# Patient Record
Sex: Female | Born: 1965
Health system: Southern US, Community
[De-identification: ages and names within clinical notes are randomized; demographics above are authoritative.]

## PROBLEM LIST (undated history)

## (undated) DIAGNOSIS — M1990 Unspecified osteoarthritis, unspecified site: Secondary | ICD-10-CM

## (undated) DIAGNOSIS — K219 Gastro-esophageal reflux disease without esophagitis: Secondary | ICD-10-CM

## (undated) DIAGNOSIS — T7840XA Allergy, unspecified, initial encounter: Secondary | ICD-10-CM

## (undated) DIAGNOSIS — M797 Fibromyalgia: Secondary | ICD-10-CM

## (undated) DIAGNOSIS — I10 Essential (primary) hypertension: Secondary | ICD-10-CM

## (undated) DIAGNOSIS — N200 Calculus of kidney: Secondary | ICD-10-CM

## (undated) DIAGNOSIS — D649 Anemia, unspecified: Secondary | ICD-10-CM

## (undated) DIAGNOSIS — F419 Anxiety disorder, unspecified: Secondary | ICD-10-CM

## (undated) DIAGNOSIS — K589 Irritable bowel syndrome without diarrhea: Secondary | ICD-10-CM

## (undated) DIAGNOSIS — M722 Plantar fascial fibromatosis: Secondary | ICD-10-CM

## (undated) HISTORY — DX: Calculus of kidney: N20.0

## (undated) HISTORY — DX: Anxiety disorder, unspecified: F41.9

## (undated) HISTORY — PX: OTHER SURGICAL HISTORY: SHX169

## (undated) HISTORY — DX: Anemia, unspecified: D64.9

## (undated) HISTORY — DX: Gastro-esophageal reflux disease without esophagitis: K21.9

## (undated) HISTORY — DX: Unspecified osteoarthritis, unspecified site: M19.90

## (undated) HISTORY — DX: Fibromyalgia: M79.7

## (undated) HISTORY — DX: Allergy, unspecified, initial encounter: T78.40XA

## (undated) HISTORY — DX: Irritable bowel syndrome, unspecified: K58.9

## (undated) HISTORY — PX: COLONOSCOPY: SHX174

## (undated) HISTORY — DX: Essential (primary) hypertension: I10

---

## 1998-12-28 ENCOUNTER — Other Ambulatory Visit: Admission: RE | Admit: 1998-12-28 | Discharge: 1998-12-28 | Payer: Self-pay | Admitting: Obstetrics and Gynecology

## 1999-03-14 ENCOUNTER — Encounter: Admission: RE | Admit: 1999-03-14 | Discharge: 1999-03-14 | Payer: Self-pay | Admitting: *Deleted

## 1999-03-14 ENCOUNTER — Encounter: Payer: Self-pay | Admitting: Rheumatology

## 1999-04-24 ENCOUNTER — Ambulatory Visit (HOSPITAL_BASED_OUTPATIENT_CLINIC_OR_DEPARTMENT_OTHER): Admission: RE | Admit: 1999-04-24 | Discharge: 1999-04-24 | Payer: Self-pay | Admitting: Orthopedic Surgery

## 1999-10-05 ENCOUNTER — Other Ambulatory Visit: Admission: RE | Admit: 1999-10-05 | Discharge: 1999-10-05 | Payer: Self-pay | Admitting: *Deleted

## 2000-06-26 ENCOUNTER — Encounter: Payer: Self-pay | Admitting: Gastroenterology

## 2001-01-01 ENCOUNTER — Other Ambulatory Visit: Admission: RE | Admit: 2001-01-01 | Discharge: 2001-01-01 | Payer: Self-pay | Admitting: Obstetrics and Gynecology

## 2001-05-23 ENCOUNTER — Encounter: Admission: RE | Admit: 2001-05-23 | Discharge: 2001-05-23 | Payer: Self-pay | Admitting: Obstetrics and Gynecology

## 2001-05-23 ENCOUNTER — Encounter: Payer: Self-pay | Admitting: Obstetrics and Gynecology

## 2001-06-27 ENCOUNTER — Encounter: Admission: RE | Admit: 2001-06-27 | Discharge: 2001-06-27 | Payer: Self-pay | Admitting: Internal Medicine

## 2001-06-27 ENCOUNTER — Encounter: Payer: Self-pay | Admitting: Internal Medicine

## 2002-01-30 ENCOUNTER — Other Ambulatory Visit: Admission: RE | Admit: 2002-01-30 | Discharge: 2002-01-30 | Payer: Self-pay | Admitting: Obstetrics and Gynecology

## 2002-07-22 ENCOUNTER — Encounter: Payer: Self-pay | Admitting: Obstetrics and Gynecology

## 2002-07-22 ENCOUNTER — Encounter: Admission: RE | Admit: 2002-07-22 | Discharge: 2002-07-22 | Payer: Self-pay | Admitting: Obstetrics and Gynecology

## 2003-01-16 HISTORY — PX: OTHER SURGICAL HISTORY: SHX169

## 2003-02-02 ENCOUNTER — Other Ambulatory Visit: Admission: RE | Admit: 2003-02-02 | Discharge: 2003-02-02 | Payer: Self-pay | Admitting: Obstetrics and Gynecology

## 2003-09-10 ENCOUNTER — Encounter: Admission: RE | Admit: 2003-09-10 | Discharge: 2003-09-10 | Payer: Self-pay | Admitting: Obstetrics and Gynecology

## 2004-02-18 ENCOUNTER — Emergency Department (HOSPITAL_COMMUNITY): Admission: EM | Admit: 2004-02-18 | Discharge: 2004-02-18 | Payer: Self-pay | Admitting: Emergency Medicine

## 2004-02-22 ENCOUNTER — Other Ambulatory Visit: Admission: RE | Admit: 2004-02-22 | Discharge: 2004-02-22 | Payer: Self-pay | Admitting: Obstetrics and Gynecology

## 2004-03-15 ENCOUNTER — Ambulatory Visit (HOSPITAL_COMMUNITY): Admission: RE | Admit: 2004-03-15 | Discharge: 2004-03-15 | Payer: Self-pay | Admitting: *Deleted

## 2004-05-23 ENCOUNTER — Ambulatory Visit: Payer: Self-pay | Admitting: Gastroenterology

## 2004-06-09 ENCOUNTER — Ambulatory Visit: Payer: Self-pay | Admitting: Gastroenterology

## 2004-09-11 ENCOUNTER — Encounter: Admission: RE | Admit: 2004-09-11 | Discharge: 2004-09-11 | Payer: Self-pay | Admitting: Obstetrics and Gynecology

## 2004-11-30 ENCOUNTER — Encounter: Admission: RE | Admit: 2004-11-30 | Discharge: 2005-02-28 | Payer: Self-pay | Admitting: Internal Medicine

## 2005-01-23 ENCOUNTER — Ambulatory Visit: Payer: Self-pay

## 2005-03-01 ENCOUNTER — Encounter: Admission: RE | Admit: 2005-03-01 | Discharge: 2005-05-30 | Payer: Self-pay | Admitting: Internal Medicine

## 2005-03-08 ENCOUNTER — Other Ambulatory Visit: Admission: RE | Admit: 2005-03-08 | Discharge: 2005-03-08 | Payer: Self-pay | Admitting: Obstetrics & Gynecology

## 2005-10-18 ENCOUNTER — Encounter: Admission: RE | Admit: 2005-10-18 | Discharge: 2005-10-18 | Payer: Self-pay | Admitting: Obstetrics and Gynecology

## 2006-03-11 ENCOUNTER — Ambulatory Visit (HOSPITAL_COMMUNITY): Admission: RE | Admit: 2006-03-11 | Discharge: 2006-03-11 | Payer: Self-pay | Admitting: Internal Medicine

## 2006-03-28 ENCOUNTER — Other Ambulatory Visit: Admission: RE | Admit: 2006-03-28 | Discharge: 2006-03-28 | Payer: Self-pay | Admitting: Obstetrics & Gynecology

## 2006-04-19 ENCOUNTER — Encounter: Admission: RE | Admit: 2006-04-19 | Discharge: 2006-04-19 | Payer: Self-pay | Admitting: Obstetrics and Gynecology

## 2007-03-12 ENCOUNTER — Encounter: Admission: RE | Admit: 2007-03-12 | Discharge: 2007-03-12 | Payer: Self-pay | Admitting: Obstetrics and Gynecology

## 2007-05-03 ENCOUNTER — Other Ambulatory Visit: Admission: RE | Admit: 2007-05-03 | Discharge: 2007-05-03 | Payer: Self-pay | Admitting: Obstetrics and Gynecology

## 2008-04-07 ENCOUNTER — Emergency Department (HOSPITAL_COMMUNITY): Admission: EM | Admit: 2008-04-07 | Discharge: 2008-04-07 | Payer: Self-pay | Admitting: Emergency Medicine

## 2008-05-12 DIAGNOSIS — F411 Generalized anxiety disorder: Secondary | ICD-10-CM | POA: Insufficient documentation

## 2008-05-12 DIAGNOSIS — I10 Essential (primary) hypertension: Secondary | ICD-10-CM | POA: Insufficient documentation

## 2009-01-25 ENCOUNTER — Emergency Department (HOSPITAL_COMMUNITY): Admission: EM | Admit: 2009-01-25 | Discharge: 2009-01-25 | Payer: Self-pay | Admitting: Emergency Medicine

## 2009-08-01 ENCOUNTER — Encounter (INDEPENDENT_AMBULATORY_CARE_PROVIDER_SITE_OTHER): Payer: Self-pay | Admitting: *Deleted

## 2009-09-21 ENCOUNTER — Ambulatory Visit: Payer: Self-pay | Admitting: Gastroenterology

## 2009-09-27 ENCOUNTER — Other Ambulatory Visit: Admission: RE | Admit: 2009-09-27 | Discharge: 2009-09-27 | Payer: Self-pay | Admitting: Internal Medicine

## 2009-09-27 ENCOUNTER — Ambulatory Visit (HOSPITAL_COMMUNITY): Admission: RE | Admit: 2009-09-27 | Discharge: 2009-09-27 | Payer: Self-pay | Admitting: Internal Medicine

## 2009-09-29 ENCOUNTER — Encounter: Admission: RE | Admit: 2009-09-29 | Discharge: 2009-09-29 | Payer: Self-pay | Admitting: Gastroenterology

## 2010-02-05 ENCOUNTER — Encounter: Payer: Self-pay | Admitting: Gastroenterology

## 2010-02-14 NOTE — Procedures (Signed)
Summary: Flexible Sigmoidoscopy   Flexible Sigmoidoscopy  Procedure date:  06/26/2000  Findings:      Results: Normal. Quality of Study: Good.  Patient Name: Wendy Valencia, Wendy Valencia MRN:  Procedure Procedures: Flexible Proctosigmoidoscopy CPT: [phone].  Personnel: Endoscopist: Venita Lick. Russella Dar, MD, Clementeen Graham.  Referred By: Judie Petit. Nicanor Bake, MD.  Exam Location: Exam performed in Outpatient Clinic. Outpatient  Patient Consent: Procedure, Alternatives, Risks and Benefits discussed, consent obtained, from patient.  Indications Symptoms: Constipation. Abdominal pain / bloating.  History  Pre-Exam Physical: Performed Jun 26, 2000. Cardio-pulmonary exam  WNL. Rectal exam, HEENT exam , Abdominal exam, Extremity exam, Neurological exam, Mental status exam WNL.  Exam Exam: Extent visualized: Descending Colon. Extent of exam: 50 cm. Patient position: on left side. Colon retroflexion performed. ASA Classification: I. Tolerance: excellent.  Monitoring: Pulse and BP monitoring, Oximetry used. Supplemental O2 given.  Colon Prep Used Fleets enema for colon prep. Prep: good.  Sedation Meds: Residual sedation present from prior procedure today. Versed 2 mg.  Findings , IMAGE TAKEN - NORMAL EXAM: Descending Colon to Rectum.   Assessment Normal examination.  Events  Unplanned Intervention: No intervention was required.  Unplanned Events: There were no complications. Plans Medication Plan: Continue current medications.  Patient Education: Patient given standard instructions for: Constipation. Disposition: After procedure patient sent to recovery. After recovery patient sent home.  Scheduling: Referring Spyros Winch, to M. Nicanor Bake, MD, on Jun 27, 2000.    This report was created from the original endoscopy report, which was reviewed and signed by the above listed endoscopist.    cc: M. Nicanor Bake, MD

## 2010-02-14 NOTE — Procedures (Signed)
Summary: EGD   EGD  Procedure date:  06/26/2000  Findings:      Findings: Normal  Location: [facility]    EGD  Procedure date:  06/26/2000  Findings:      Findings: Normal  Location: [facility]   Patient Name: Wendy Valencia, Wendy Valencia MRN:  Procedure Procedures: Panendoscopy (EGD) CPT: 43235.  Personnel: Endoscopist: Venita Lick. Russella Dar, MD, Clementeen Graham.  Referred By: Judie Petit. Nicanor Bake, MD.  Exam Location: Exam performed in Outpatient Clinic. Outpatient  Patient Consent: Procedure, Alternatives, Risks and Benefits discussed, consent obtained, from patient.  Indications Symptoms: Abdominal pain, location: epigastric. Reflux symptoms  History  Pre-Exam Physical: Performed Jun 26, 2000  Cardio-pulmonary exam, HEENT exam, Abdominal exam, Extremity exam, Neurological exam, Mental status exam WNL.  Exam Exam Info: Maximum depth of insertion Duodenum, intended Duodenum. Patient position: on left side. Vocal cords not visualized. Gastric retroflexion performed. Images taken. ASA Classification: I. Tolerance: good.  Sedation Meds: Patient assessed and found to be appropriate for moderate (conscious) sedation. Fentanyl 100 mcg. Versed 10 mg. Cetacaine Spray 2 sprays  Monitoring: BP and pulse monitoring done. Oximetry used. Supplemental O2 given  Findings Normal: Proximal Esophagus to Duodenal 2nd Portion.   Assessment Normal examination.  Events  Unplanned Intervention: No unplanned interventions were required.  Unplanned Events: There were no complications. Plans Medication(s): Continue current medications. PPI: Pantoprazole/Protonix 40 mg QD,   Patient Education: Patient given standard instructions for: Reflux.  Disposition: After procedure patient sent to recovery. After recovery patient sent home.  Scheduling: Referring provider, to M. Nicanor Bake, MD, Jun 27, 2000.    This report was created from the original endoscopy report, which  was reviewed and signed by the above listed endoscopist.    cc: M. Nicanor Bake, MD

## 2010-02-14 NOTE — Assessment & Plan Note (Signed)
Summary: Gastroenterology  Talitha A MR#:  [id number] Page #  NAME:  Wendy Valencia, Wendy Valencia  OFFICE NO:  [id number]  DATE:  05/23/04  DOB:  [birth year]  REFERRING PHYSICIAN:  Dr. Marisue Brooklyn  REASON FOR REFERRAL:  Abdominal pain, bloating, and constipation.  HISTORY OF PRESENT ILLNESS:  The patient is a 45 year old [place] American female that I saw in the past for similar symptoms in April of 2002.  She did have an iron-deficiency anemia at that time, which was felt to be related to menorrhagia.  She underwent flexible sigmoidoscopy and upper endoscopy on June 26, 2000.  Both exams were completely normal.  She was treated for constipation and presumed gastroesophageal reflux disease.  Abdominal ultrasound imaging did reveal cholelithiasis and fatty infiltration of the liver in May of 2002.  She states her iron-deficiency anemia has resolved since beginning Depo-Medrol injections.  She has ongoing problems with generalized abdominal pain associated with bloating, occasional cramps, and hyperactive bowel sounds.  She has intermittent nausea.  She does have intermittent belching and nausea that have increased over the past 3 months.  She uses Tums frequently, which does seem to help her symptoms.  She was started on Zelnorm, which did appear to improve her symptoms.  However, she developed worse cramping on this medication while taking it at lunch and dinner, and she has cut it back to once a day.  She was on Wellbutrin for 2 years and then Lexapro for a while, and her symptoms did appear to be under better control while on these medications.  She had a tummy tuck done about a year and a half ago.  She has noted dark stools while taking iron, but since discontinuing iron, her stools are normal color.  She notes no rectal bleeding, change in stool caliber, dysphagia, or odynophagia.  There is no family history of colon cancer, colon polyps, or inflammatory bowel disease.  PAST MEDICAL HISTORY:  Hypertension,  anxiety, panic disorder, depression, history of kidney stones, status post tummy tuck, constipation.  CURRENT MEDICATIONS:  Current medications listed on the chart are updated and reviewed.  MEDICATION ALLERGIES:  None known.  SOCIAL HISTORY:  She is married with 1 child.  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  She smokes about 1 pack of cigarettes a week and drinks about 1 alcoholic beverage a week.  REVIEW OF SYSTEMS:  Multiple areas are positive per the handwritten form.   PHYSICAL EXAMINATION:  An anxious, obese African American female in no acute distress.  Height 5 feet 7-1/2 inches, weight 244 pounds, blood pressure 138/74, pulse 98, respirations 20.  HEENT exam:  Anicteric sclerae.  Oropharynx clear.  Chest:  Clear to auscultation bilaterally.  Cardiac:  Regular rate and rhythm without murmurs appreciated.  Abdomen is soft, with generalized tenderness to deep palpation.  No rebound or guarding.  No palpable organomegaly, masses, or hernias.  Normoactive bowel sounds.  No distention appreciated.  Rectal examination deferred.  Extremities are without clubbing, cyanosis, or edema.  Neurologic:  Alert and oriented x3, anxious.  Grossly nonfocal.  ASSESSMENT AND PLAN:  Presumed constipation-predominant irritable bowel syndrome by prior evaluation.  She is given all standard instructions for adequate fluid and fiber intake, and she is advised to take her Zelnorm in the morning and in the evening so the doses are approximately 10-12 hours apart.  She is advised to take a mild stool softener or a mild laxative such as milk of magnesia for refractory constipation.  She is given information on a low-gas diet and encouraged to continue using Beano.  She does have a history of cholelithiasis, and her intermittent right-sided abdominal pains may be related to symptomatic cholelithiasis.  We will proceed with abdominal ultrasound imaging.  Return office visit after the ultrasound  has been completed, and she has adjusted her diet and Zelnorm dosage as above.      Venita Lick. Russella Dar, M.D., F.A.C.G.  [id number] cc:  Dr. Marisue Brooklyn D:  05/23/04; T:  ; Job [phone]

## 2010-02-14 NOTE — Letter (Signed)
Summary: New Patient letter  [facility]  [address] [place], [place] [address]   Phone: [phone]  Fax: [phone]       08/01/2009 MRN: [id number]  Wendy Valencia [address] [place], [place]  [address]  Dear [name],  Welcome to the Gastroenterology Division at [facility].    You are scheduled to see Dr. Claudette Head on September 21, 2009 at 10:15am on the 3rd floor at [facility], [address]  We ask that you try to arrive at our office 15 minutes prior to your appointment time to allow for check-in.  We would like you to complete the enclosed self-administered evaluation form prior to your visit and bring it with you on the day of your appointment.  We will review it with you.  Also, please bring a complete list of all your medications or, if you prefer, bring the medication bottles and we will list them.  Please bring your insurance card so that we may make a copy of it.  If your insurance requires a referral to see a specialist, please bring your referral form from your primary care physician.  Co-payments are due at the time of your visit and may be paid by cash, check or credit card.     Your office visit will consist of a consult with your physician (includes a physical exam), any laboratory testing he/she may order, scheduling of any necessary diagnostic testing (e.g. x-ray, ultrasound, CT-scan), and scheduling of a procedure (e.g. Endoscopy, Colonoscopy) if required.  Please allow enough time on your schedule to allow for any/all of these possibilities.    If you cannot keep your appointment, please call [phone] to cancel or reschedule prior to your appointment date.  This allows [place] the opportunity to schedule an appointment for another patient in need of care.  If you do not cancel or reschedule by 5 p.m. the business day prior to your appointment date, you will be charged a $50.00 late cancellation/no-show fee.     Thank you for choosing [facility] for your medical needs.  We appreciate the opportunity to care for you.  Please visit [place] at our website  to learn more about our practice.                     Sincerely,                                                             The Gastroenterology Division

## 2010-02-14 NOTE — Assessment & Plan Note (Signed)
Summary: abd pain....em   History of Present Illness Visit Type: Initial Visit Primary GI MD: Elie Goody MD [facility] Primary Provider: Marisue Brooklyn, DO Chief Complaint: LUQ pain x2 weeks History of Present Illness:   This is a 45 year old female who relates years of recurrent abdominal pain, belching, bloating, and constipation. She has had more significant symptoms with recurrent left upper quadrant pain and notes a 20 pound weight loss over the past 7 months with intention to lose weight. She states that metoclopramide is effective in controlling her abdominal pain and bloating. Zelnorm, used in the past, substantially helped all her symptoms. Amitza has lead to diarrhea. Levsin has been ineffective.  She has tried Nexium, Protonix and Prevacid without a significant change in symptoms. She takes a tea containing senna approximately once per week for management of constipation. She underwent EGD and flexible sigmoidoscopy in June 2002, and both were normal.   GI Review of Systems    Reports abdominal pain, belching, bloating, chest pain, loss of appetite, nausea, vomiting, and  weight loss.     Location of  Abdominal pain: LUQ. Weight loss of 20 pounds over 7 months.   Denies acid reflux, dysphagia with liquids, dysphagia with solids, heartburn, vomiting blood, and  weight gain.      Reports change in bowel habits, constipation, diarrhea, and  irritable bowel syndrome.     Denies anal fissure, black tarry stools, diverticulosis, fecal incontinence, heme positive stool, hemorrhoids, jaundice, light color stool, liver problems, rectal bleeding, and  rectal pain. Preventive Screening-Counseling & Management      Drug Use:  no.     Current Medications (verified): 1)  Metoclopramide Hcl 10 Mg Tabs (Metoclopramide Hcl) .... Three Times A Day As Needed 2)  Xanax 1 Mg Tabs (Alprazolam) .... Take 1 1/2 Tablet At Bedtime 3)  Tramadol Hcl 50 Mg Tabs (Tramadol Hcl) .... Take 1 Tablet  Every  Morning 4)  Lisinopril-Hydrochlorothiazide 20-12.5 Mg Tabs (Lisinopril-Hydrochlorothiazide) .... Once Daily 5)  Phentermine Hcl 37.5 Mg Caps (Phentermine Hcl) .... Once Daily  Allergies (verified): No Known Drug Allergies  Past History:  Past Medical History: Iron-deficiency anemia Fatty infiltration of the liver Gallstones Anxiety Disorder Hypertension Panic disorder Depression Arthritis Irritable Bowel Syndrome Obesity  Past Surgical History: Reviewed history from 09/16/2009 and no changes required. Tummy tuck surgery Left shoulder surgery  Family History: Family History of Breast Cancer:Mother No FH of Colon Cancer: Family History of Irritable Bowel Syndrome:Mother  Social History: Married  current smoker w/i last 12 mos., occasional drinker  Occupation: Airline pilot Alcohol Use - yes Daily Caffeine Use Illicit Drug Use - no Drug Use:  no  Review of Systems       The patient complains of allergy/sinus, arthritis/joint pain, back pain, headaches-new, muscle pains/cramps, skin rash, and sleeping problems.         The pertinent positives and negatives are noted as above and in the HPI. All other ROS were reviewed and were negative.   Vital Signs:  Patient profile:   45 year old female Height:      67 inches Weight:      233.38 pounds BMI:     36.68 Pulse rate:   68 / minute Pulse rhythm:   regular BP sitting:   116 / 70  (left arm) Cuff size:   large  Vitals Entered By: June McMurray CMA Duncan Dull) (September 21, 2009 10:18 AM)  Physical Exam  General:  Well developed, well nourished, no acute distress. obese.  Head:  Normocephalic and atraumatic. Eyes:  PERRLA, no icterus. Ears:  Normal auditory acuity. Mouth:  No deformity or lesions, dentition normal. Neck:  Supple; no masses or thyromegaly. Lungs:  Clear throughout to auscultation. Heart:  Regular rate and rhythm; no murmurs, rubs,  or bruits. Abdomen:  Soft, nontender and nondistended. No masses,  hepatosplenomegaly or hernias noted. Normal bowel sounds. Msk:  Symmetrical with no gross deformities. Normal posture. Pulses:  Normal pulses noted. Extremities:  No clubbing, cyanosis, edema or deformities noted. Neurologic:  Alert and  oriented x4;  grossly normal neurologically. Cervical Nodes:  No significant cervical adenopathy. Inguinal Nodes:  No significant inguinal adenopathy. Psych:  Alert and cooperative. anxious and easily distracted.    Impression & Recommendations:  Problem # 1:  ABDOMINAL PAIN-LUQ (ICD-789.02) Recurrent left upper quadrant abdominal pain, associated with gas, abdominal bloating, and variable bowel habits. I suspect she has irritable bowel syndrome. She states she has a physical examination scheduled with Dr. Elisabeth Most next week and I await the blood work from that visit. Schedule abd ultrasound. Begin a low gas diet. Trial of Levbid one twice daily. The long-term potential side effects of metoclopramide, including tardive dyskinesia, were discussed with the patient and I asked her to discontinue or at least minimize this medication.  Problem # 2:  CONSTIPATION (ICD-564.00) Increase fiber and fluid intake. Trial of Florastor one twice daily and MiraLax daily as needed. Avoid senna containing laxatives.  Problem # 3:  ANXIETY (ICD-300.00) An SSRI or similar medication may help for management of anxiety and also can help with long-term management of irritable bowel syndrome. Will ask Dr. Carmela Hurt to further consider.  Other Orders: Ultrasound Abdomen (UAS)  Patient Instructions: 1)  Start Florastor samples one capsule by mouth two times a day x 1 month.  2)  Pick up your prescription for Levbid at your pharmacy.  3)  Excessive Gas Diet handout given.  4)  Decrease or Discontinue Metaclopramide and adverse reaction sheet given to patient.  5)  Please schedule a follow-up appointment in 2 months.  6)  Copy sent to : Marisue Brooklyn, DO 7)  The medication list  was reviewed and reconciled.  All changed / newly prescribed medications were explained.  A complete medication list was provided to the patient / caregiver.  Prescriptions: LEVBID 0.375 MG XR12H-TAB (HYOSCYAMINE SULFATE) one tablet by mouth two times a day  #60 x 11   Entered by:   Christie Nottingham CMA (AAMA)   Authorized by:   Meryl Dare MD [facility]   Signed by:   Meryl Dare MD FACG on 09/21/2009   Method used:   Electronically to        [id number] (retail)       [address] [place], [place]  [address]       Ph: [phone]       Fax: [phone]   RxID:   [id number]

## 2010-04-27 LAB — URINALYSIS, ROUTINE W REFLEX MICROSCOPIC
Bilirubin Urine: NEGATIVE
Ketones, ur: NEGATIVE mg/dL
Nitrite: NEGATIVE
Urobilinogen, UA: 1 mg/dL (ref 0.0–1.0)
pH: 6.5 (ref 5.0–8.0)

## 2010-04-27 LAB — DIFFERENTIAL: Basophils Relative: 0 % (ref 0–1)

## 2010-04-27 LAB — CBC
MCHC: 34.4 g/dL (ref 30.0–36.0)
MCV: 88.1 fL (ref 78.0–100.0)
RBC: 4.46 MIL/uL (ref 3.87–5.11)

## 2010-04-27 LAB — BASIC METABOLIC PANEL
CO2: 23 mEq/L (ref 19–32)
Chloride: 110 mEq/L (ref 96–112)
Creatinine, Ser: 0.78 mg/dL (ref 0.4–1.2)
GFR calc non Af Amer: >60 mL/min (ref >60)
Glucose, Bld: 108 mg/dL — ABNORMAL HIGH (ref 70–99)
Potassium: 3.7 mEq/L (ref 3.5–5.1)

## 2010-04-27 LAB — D-DIMER, QUANTITATIVE: D-Dimer, Quant: 0.34 ug/mL-FEU (ref 0.00–0.48)

## 2010-04-27 LAB — POCT CARDIAC MARKERS
CKMB, poc: <1 ng/mL — ABNORMAL LOW (ref 1.0–8.0)
Myoglobin, poc: 53.7 ng/mL (ref 12–200)
Troponin i, poc: <0.05 ng/mL (ref 0.00–0.09)

## 2010-04-27 LAB — POCT PREGNANCY, URINE: Preg Test, Ur: NEGATIVE

## 2010-06-02 NOTE — Op Note (Signed)
[place]  Patient:    Wendy Valencia, Wendy Valencia                          MRN: [id number] Proc. Date: 04/24/99 Attending:  Georgena Spurling, M.D.                           Operative Report  PREOPERATIVE DIAGNOSIS:  Left shoulder impingement syndrome and bursitis and tendinitis.  POSTOPERATIVE DIAGNOSIS:  Left shoulder impingement syndrome and bursitis and tendinitis.  PROCEDURE:  Right shoulder arthroscopy with a subacromial decompression and rotator cuff debridement.  SURGEON:  Georgena Spurling, M.D.  ASSISTANT:  Jamelle Rushing, P.A.-C.  ANESTHESIA:  General.  INDICATIONS:  The patient has undergone conservative treatment, with failure of  physical therapy, anti-inflammatories, and injections.  DESCRIPTION OF PROCEDURE:  The patient was taken to the outpatient operating room and placed in the beach chair position, after undergoing general anesthesia. The left shoulder and upper extremity were prepped and draped in the usual sterile fashion.  A #11 blade was used to create a posterolateral portal.  A blunt trocar and cannula were used to enter the glenohumeral joint, and the camera was inserted, and a glenohumeral diagnostic arthroscopy was performed.  There was some fraying of the labrum anteriorly.  It was felt to be significant, so the anterior portals ere made with a #18 gauge spinal needle under direct visualization, followed by a #11 blade, a blunt trocar, and cannula.  The 4.5 Kudo shaver was used to debride the labrum, but there was a good amount of labrum left, and it was firmly attached o the anterior glenoid.  The anterior portal was then closed with a #4-0 nylon stitch.  At this point we created a lateral portal large enough for a 6.0 mm cannula to be placed in the subacromial space, and the camera was inserted into the subacromial space posteriorly.  The Kudo shaver was then placed in the subacromial space and a complete bursectomy  was performed.  The Arthro-Care debridement Wand was then used to remove the bursa from the undersurface of the acromion, to expose the anterolateral corner and the anterior rim of the acromion, as well as the [facility] joint.  The 5.5 mm bur was used to resect approximately 3.0 to 4.0 mm of bone from the anterior acromion.  This was checked with the camera, both in the posterior and in the lateral portal positions.  The CL ligament was excised with the Arthro-Care Wand.  The debridement of the rotator cuff was performed, and there was no identifiable full-thickness tear.  Once this was insured, and all bursa was removed, switching from the posterior to the lateral portals with the shaver and the camera, the fluid and instruments were removed.  A Marcaine/morphine mixture was placed in the three portals.  They were closed with interrupted #4-0 nylon stitches and covered with Xeroform dressing, sponges, three ABDs, and tape. The patient was placed in a sling.  The patient tolerated the procedure well and was awakened in stable condition, nd taken to the recovery room.  DRAINS:  None.  COMPLICATIONS:  None.  TOURNIQUET:  None. DD:  04/25/99 TD:  04/26/99 Job: [id number]

## 2010-10-05 ENCOUNTER — Other Ambulatory Visit (HOSPITAL_COMMUNITY)
Admission: RE | Admit: 2010-10-05 | Discharge: 2010-10-05 | Disposition: A | Discharge status: Home / Self Care | Payer: BC Managed Care – PPO | Source: Ambulatory Visit | Attending: Internal Medicine | Admitting: Internal Medicine

## 2010-10-05 DIAGNOSIS — Z01419 Encounter for gynecological examination (general) (routine) without abnormal findings: Secondary | ICD-10-CM | POA: Insufficient documentation

## 2010-10-11 ENCOUNTER — Encounter: Payer: Self-pay | Admitting: Cardiology

## 2010-10-11 ENCOUNTER — Other Ambulatory Visit: Payer: Self-pay | Admitting: Internal Medicine

## 2010-10-11 ENCOUNTER — Ambulatory Visit (INDEPENDENT_AMBULATORY_CARE_PROVIDER_SITE_OTHER): Payer: BC Managed Care – PPO | Admitting: Cardiology

## 2010-10-11 DIAGNOSIS — Z720 Tobacco use: Secondary | ICD-10-CM | POA: Insufficient documentation

## 2010-10-11 DIAGNOSIS — F17200 Nicotine dependence, unspecified, uncomplicated: Secondary | ICD-10-CM

## 2010-10-11 DIAGNOSIS — R0600 Dyspnea, unspecified: Secondary | ICD-10-CM

## 2010-10-11 DIAGNOSIS — R9431 Abnormal electrocardiogram [ECG] [EKG]: Secondary | ICD-10-CM

## 2010-10-11 DIAGNOSIS — Z1231 Encounter for screening mammogram for malignant neoplasm of breast: Secondary | ICD-10-CM

## 2010-10-11 DIAGNOSIS — I10 Essential (primary) hypertension: Secondary | ICD-10-CM

## 2010-10-11 DIAGNOSIS — R0602 Shortness of breath: Secondary | ICD-10-CM

## 2010-10-11 DIAGNOSIS — R0609 Other forms of dyspnea: Secondary | ICD-10-CM

## 2010-10-11 NOTE — Assessment & Plan Note (Signed)
Blood pressure controlled. Management per primary care.

## 2010-10-11 NOTE — Patient Instructions (Signed)
Your physician has requested that you have a stress echocardiogram. For further information please visit [email or web address]. Please follow instruction sheet as given.   

## 2010-10-11 NOTE — Progress Notes (Signed)
HPI: 45 yo female for evaluation of abnormal ECG and dyspnea. Echocardiogram in January of 2007 showed normal LV function and mild left atrial enlargement. Patient notices increased dyspnea on exertion over the past 6 months. It occurs with more vigorous activities but not routine activities. No orthopnea, PND, pedal edema, syncope or chest pain. Recent electrocardiogram felt to be abnormal. Because of the above cardiology is asked to further evaluate.  Current Outpatient Prescriptions  Medication Sig Dispense Refill  . ALPRAZolam (XANAX) 1 MG tablet Take 1 mg by mouth at bedtime as needed.        . Aspirin-Caffeine (BC FAST PAIN RELIEF ARTHRITIS) 1000-65 MG PACK Take by mouth 3 (three) times a week.        . Calcium-Phosphorus-Vitamin D 200-96.6-200 MG-MG-UNIT CHEW Chew by mouth.        . cetirizine-pseudoephedrine (ZYRTEC-D) 5-120 MG per tablet Take 1 tablet by mouth as needed.        . Cholecalciferol (EQL VITAMIN D GUMMIES CHILD PO) Take 2,000 Units by mouth daily.        . diphenhydrAMINE (BENADRYL) 25 mg capsule Take 25 mg by mouth every 6 (six) hours as needed.        Marland Kitchen ibuprofen (ADVIL,MOTRIN) 200 MG tablet Take 200 mg by mouth every 6 (six) hours as needed.        Marland Kitchen lisinopril-hydrochlorothiazide (PRINZIDE,ZESTORETIC) 20-12.5 MG per tablet Take 1 tablet by mouth daily.        . Magnesium 250 MG TABS Take by mouth.        . medroxyPROGESTERone (DEPO-PROVERA) 150 MG/ML injection Inject 150 mg into the muscle every 3 (three) months.        . metoCLOPramide (REGLAN) 10 MG tablet Take 10 mg by mouth as needed.        . Multiple Vitamin (MULTIVITAMIN) tablet Take 1 tablet by mouth daily.        . phentermine 37.5 MG capsule Take by mouth every morning. 1/2 in the morning and 1/2 in the evening       . SUMAtriptan (IMITREX) 25 MG tablet Take 25 mg by mouth as needed. rare       . traMADol (ULTRAM) 50 MG tablet Take 100 mg by mouth 2 (two) times daily.          No Known Allergies  Past  Medical History  Diagnosis Date  . Hypertension   . Nephrolithiasis   . IBS (irritable bowel syndrome)     Past Surgical History  Procedure Date  . Left shoulder arthroscopic surgery     History   Social History  . Marital Status: Legally Separated    Spouse Name: N/A    Number of Children: 1  . Years of Education: N/A   Occupational History  .      Account manager for [employer]   Social History Main Topics  . Smoking status: Current Everyday Smoker -- 0.2 packs/day for 7 years    Types: Cigarettes  . Smokeless tobacco: Never Used  . Alcohol Use: 1.8 oz/week    3 Glasses of wine per week     3 times a week  . Drug Use: No  . Sexually Active: Not on file   Other Topics Concern  . Not on file   Social History Narrative  . No narrative on file    Family History  Problem Relation Age of Onset  . Heart attack Maternal Grandfather   . Heart attack Paternal Grandfather  ROS: no fevers or chills, productive cough, hemoptysis, dysphasia, odynophagia, melena, hematochezia, dysuria, hematuria, rash, seizure activity, orthopnea, PND, pedal edema, claudication. Remaining systems are negative.  Physical Exam: General:  Well developed/well nourished in NAD Skin warm/dry Patient not depressed No peripheral clubbing Back-normal HEENT-normal/normal eyelids Neck supple/normal carotid upstroke bilaterally; no bruits; no JVD; no thyromegaly chest - CTA/ normal expansion CV - RRR/normal S1 and S2; no murmurs, rubs or gallops;  PMI nondisplaced Abdomen -NT/ND, no HSM, no mass, + bowel sounds, no bruit 2+ femoral pulses, no bruits Ext-no edema, chords, 2+ DP Neuro-grossly nonfocal  ECG NSR, nonspecific ST changes

## 2010-10-11 NOTE — Assessment & Plan Note (Signed)
Patient counseled on discontinuing. 

## 2010-10-11 NOTE — Assessment & Plan Note (Signed)
Stress echocardiogram as described. 

## 2010-10-11 NOTE — Assessment & Plan Note (Signed)
Plan stress echocardiogram for risk stratification and quantification of LV function. Note patient does have a history of phen-phen use. However previous echocardiogram showed no significant valvular disease and I do not hear aortic insufficiency or mitral regurgitation on examination.

## 2010-10-17 ENCOUNTER — Encounter: Payer: Self-pay | Admitting: *Deleted

## 2010-10-18 ENCOUNTER — Ambulatory Visit
Admission: RE | Admit: 2010-10-18 | Discharge: 2010-10-18 | Disposition: A | Discharge status: Home / Self Care | Payer: BC Managed Care – PPO | Source: Ambulatory Visit | Attending: Internal Medicine | Admitting: Internal Medicine

## 2010-10-18 ENCOUNTER — Ambulatory Visit (HOSPITAL_COMMUNITY): Payer: BC Managed Care – PPO

## 2010-10-18 ENCOUNTER — Ambulatory Visit (HOSPITAL_COMMUNITY): Payer: BC Managed Care – PPO | Admitting: Radiology

## 2010-10-18 ENCOUNTER — Other Ambulatory Visit: Payer: Self-pay | Admitting: Internal Medicine

## 2010-10-18 ENCOUNTER — Other Ambulatory Visit: Payer: Self-pay | Admitting: *Deleted

## 2010-10-18 DIAGNOSIS — R079 Chest pain, unspecified: Secondary | ICD-10-CM

## 2010-10-18 DIAGNOSIS — Z1231 Encounter for screening mammogram for malignant neoplasm of breast: Secondary | ICD-10-CM

## 2010-10-18 DIAGNOSIS — N63 Unspecified lump in unspecified breast: Secondary | ICD-10-CM

## 2010-10-25 ENCOUNTER — Encounter (HOSPITAL_COMMUNITY): Payer: BC Managed Care – PPO | Admitting: Radiology

## 2010-10-31 ENCOUNTER — Ambulatory Visit (HOSPITAL_COMMUNITY): Payer: BC Managed Care – PPO | Attending: Cardiology | Admitting: Radiology

## 2010-10-31 VITALS — Ht 68.0 in | Wt 236.0 lb

## 2010-10-31 DIAGNOSIS — R079 Chest pain, unspecified: Secondary | ICD-10-CM

## 2010-10-31 DIAGNOSIS — R0989 Other specified symptoms and signs involving the circulatory and respiratory systems: Secondary | ICD-10-CM

## 2010-10-31 DIAGNOSIS — R0789 Other chest pain: Secondary | ICD-10-CM

## 2010-10-31 DIAGNOSIS — I119 Hypertensive heart disease without heart failure: Secondary | ICD-10-CM

## 2010-10-31 DIAGNOSIS — R0609 Other forms of dyspnea: Secondary | ICD-10-CM

## 2010-10-31 DIAGNOSIS — I4949 Other premature depolarization: Secondary | ICD-10-CM

## 2010-10-31 MED ORDER — TECHNETIUM TC 99M TETROFOSMIN IV KIT
33.0000 | PACK | Freq: Once | INTRAVENOUS | Status: AC | PRN
  Administered 2010-10-31: 33 via INTRAVENOUS

## 2010-10-31 NOTE — Progress Notes (Signed)
[facility] SITE 3 NUCLEAR MED [address] [place] [address] [phone]  Cardiology Nuclear Med Study  Wendy Valencia is a 45 y.o. female [id number] July 03, 1965   Nuclear Med Background Indication for Stress Test:  Evaluation for Ischemia, Abnormal EKG and Stress Echo cancelled on 10/18/10 due to poor image quality History: 01/23/05 Echo: 55-65% LA mildly dilated Cardiac Risk Factors: Hypertension and Smoker  Symptoms:  Chest Pain, DOE, Fatigue, Light-Headedness and Palpitations   Nuclear Pre-Procedure Caffeine/Decaff Intake:  None NPO After: 9:30pm   Lungs:  clear IV 0.9% NS with Angio Cath:  22g  IV Site: R Antecubital x 1, tolerated well IV Started by:  Irean Hong, RN  Chest Size (in):  44 Cup Size: DD  Height: 5\' 8"  (1.727 m)  Weight:  236 lb (107.049 kg)  BMI:  Body mass index is 35.88 kg/(m^2). Tech Comments:  n/a    Nuclear Med Study 1 or 2 day study: 2 day  Stress Test Type:  Stress  Reading MD: Arvilla Meres, MD  Order Authorizing Provider:  Olga Millers, MD  Resting Radionuclide: Technetium 41m Tetrofosmin  Resting Radionuclide Dose: 33.0 mCi   Stress Radionuclide:  Technetium 97m Tetrofosmin  Stress Radionuclide Dose: 33.0 mCi           Stress Protocol Rest HR: 72 Stress HR: 176  Rest BP: 116/72 Stress BP: 180/87  Exercise Time (min): 7:15 METS: 8.90   Predicted Max HR: 176 bpm % Max HR: 100 bpm Rate Pressure Product: 21308   Dose of Adenosine (mg):  n/a Dose of Lexiscan: n/a mg  Dose of Atropine (mg): n/a Dose of Dobutamine: n/a mcg/kg/min (at max HR)  Stress Test Technologist: Milana Na, EMT-P  Nuclear Technologist:  Doyne Keel, CNMT     Rest Procedure:  Myocardial perfusion imaging was performed at rest 45 minutes following the intravenous administration of Technetium 22m Tetrofosmin. Rest ECG: NSR  Stress Procedure:  The patient exercised for 7:15.  The patient stopped due to fatigue and denied any chest  pain.  There were non specific ST-T wave changes and rare pvcs.  Technetium 71m Tetrofosmin was injected at peak exercise and myocardial perfusion imaging was performed after a brief delay. Stress ECG: No significant change from baseline ECG  QPS Raw Data Images:  Normal; no motion artifact; normal heart/lung ratio. Stress Images:  Normal homogeneous uptake in all areas of the myocardium. Rest Images:  Normal homogeneous uptake in all areas of the myocardium. Subtraction (SDS):  No evidence of ischemia. Transient Ischemic Dilatation (Normal <1.22):  1.04 Lung/Heart Ratio (Normal <0.45):  0.36  Quantitative Gated Spect Images QGS EDV:  83 ml QGS ESV:  32 ml QGS cine images:  Mild septal dyssynergy. QGS EF: 62%  Impression Exercise Capacity:  Fair exercise capacity. BP Response:  Normal blood pressure response. Clinical Symptoms:  fatigue ECG Impression:  No significant ST segment change suggestive of ischemia. Comparison with Prior Nuclear Study: No previous nuclear study performed  Overall Impression:  Normal stress nuclear study.   Willa Rough

## 2010-11-02 ENCOUNTER — Encounter (HOSPITAL_COMMUNITY): Payer: BC Managed Care – PPO | Admitting: Radiology

## 2010-11-02 ENCOUNTER — Ambulatory Visit (HOSPITAL_COMMUNITY): Payer: BC Managed Care – PPO | Attending: Cardiology | Admitting: Radiology

## 2010-11-02 DIAGNOSIS — R0989 Other specified symptoms and signs involving the circulatory and respiratory systems: Secondary | ICD-10-CM

## 2010-11-02 MED ORDER — TECHNETIUM TC 99M TETROFOSMIN IV KIT
33.0000 | PACK | Freq: Once | INTRAVENOUS | Status: AC | PRN
  Administered 2010-11-02: 33 via INTRAVENOUS

## 2010-11-06 ENCOUNTER — Telehealth: Payer: Self-pay | Admitting: Cardiology

## 2010-11-06 NOTE — Telephone Encounter (Signed)
Pt returning call to [place] from Friday. Please return pt call.

## 2010-11-06 NOTE — Telephone Encounter (Signed)
Pt returned your call.  

## 2010-11-06 NOTE — Telephone Encounter (Signed)
PT AWARE OF MYOVIEW RESULTS./CY 

## 2010-11-08 ENCOUNTER — Encounter: Payer: Self-pay | Admitting: Cardiology

## 2010-11-17 ENCOUNTER — Ambulatory Visit
Admission: RE | Admit: 2010-11-17 | Discharge: 2010-11-17 | Disposition: A | Discharge status: Home / Self Care | Payer: BC Managed Care – PPO | Source: Ambulatory Visit | Attending: Internal Medicine | Admitting: Internal Medicine

## 2010-11-17 ENCOUNTER — Other Ambulatory Visit: Payer: Self-pay | Admitting: Internal Medicine

## 2010-11-17 DIAGNOSIS — N63 Unspecified lump in unspecified breast: Secondary | ICD-10-CM

## 2011-03-21 ENCOUNTER — Telehealth: Payer: Self-pay | Admitting: Cardiology

## 2011-03-21 NOTE — Telephone Encounter (Signed)
Stress faxed to Jusy/Dr.McKowen Office @ [phone] 03/21/11/Km

## 2011-06-15 ENCOUNTER — Encounter (INDEPENDENT_AMBULATORY_CARE_PROVIDER_SITE_OTHER): Payer: Self-pay | Admitting: General Surgery

## 2011-06-15 ENCOUNTER — Ambulatory Visit (INDEPENDENT_AMBULATORY_CARE_PROVIDER_SITE_OTHER): Payer: BC Managed Care – PPO | Admitting: General Surgery

## 2011-06-15 DIAGNOSIS — I10 Essential (primary) hypertension: Secondary | ICD-10-CM

## 2011-06-15 DIAGNOSIS — D649 Anemia, unspecified: Secondary | ICD-10-CM

## 2011-06-15 DIAGNOSIS — M129 Arthropathy, unspecified: Secondary | ICD-10-CM

## 2011-06-15 DIAGNOSIS — K589 Irritable bowel syndrome without diarrhea: Secondary | ICD-10-CM

## 2011-06-15 DIAGNOSIS — M1990 Unspecified osteoarthritis, unspecified site: Secondary | ICD-10-CM

## 2011-06-15 DIAGNOSIS — E119 Type 2 diabetes mellitus without complications: Secondary | ICD-10-CM

## 2011-06-15 NOTE — Progress Notes (Signed)
Addended by: Maryan Puls on: 06/15/2011 12:51 PM   Modules accepted: Orders

## 2011-06-15 NOTE — Progress Notes (Signed)
Subjective:   Morbid Obesity  Patient ID: Wendy Valencia, female   DOB: [birth year], 46 y.o.   MRN: [id number]  HPI [name]46 y.o.female presents for consideration for surgical treatment for morbid obesity.  she gives a history of progressive obesity since early adulthood despite multiple attempts at medical management.  About 5 years ago she underwent an abdominoplasty and made a concerted effort at weight loss and was able to lose about 50 pounds. However she has recently experienced progressive weight regain.  her weight has been affecting her in a number of ways including Chronic joint pain with documented arthritis in her knees, onset of hypertension requiring medications, elevated blood sugar diagnosed as prediabetes and evidence of some fatty liver disease. . she has been to our initial information seminar, researched surgical options thoroughly and is interested in Gastric bypass due to the lack of foreign material and frequent visits required for lap band.   Past Medical History  Diagnosis Date  . Hypertension   . Nephrolithiasis   . IBS (irritable bowel syndrome)   . Anemia   . Arthritis   . Diabetes mellitus   . Fibromyalgia    Past Surgical History  Procedure Date  . Left shoulder arthroscopic surgery   . Tummy tuck 2005   Current Outpatient Prescriptions  Medication Sig Dispense Refill  . ALPRAZolam (XANAX) 1 MG tablet Take 1.5 mg by mouth at bedtime as needed.       . Aspirin-Caffeine (BC FAST PAIN RELIEF ARTHRITIS) 1000-65 MG PACK Take by mouth 3 (three) times a week.        . Calcium-Phosphorus-Vitamin D 200-96.6-200 MG-MG-UNIT CHEW Chew by mouth.        . cetirizine-pseudoephedrine (ZYRTEC-D) 5-120 MG per tablet Take 1 tablet by mouth as needed.        . Cholecalciferol (EQL VITAMIN D GUMMIES CHILD PO) Take 2,000 Units by mouth daily.        . diphenhydrAMINE (BENADRYL) 25 mg capsule Take 25 mg by mouth every 6 (six) hours as needed.        Marland Kitchen ibuprofen  (ADVIL,MOTRIN) 200 MG tablet Take 200 mg by mouth every 6 (six) hours as needed.        Marland Kitchen lisinopril-hydrochlorothiazide (PRINZIDE,ZESTORETIC) 20-12.5 MG per tablet Take 1 tablet by mouth daily.        . Magnesium 250 MG TABS Take 500 mg by mouth daily.       . medroxyPROGESTERone (DEPO-PROVERA) 150 MG/ML injection Inject 150 mg into the muscle every 3 (three) months.        . metoCLOPramide (REGLAN) 10 MG tablet Take 10 mg by mouth as needed.        . Multiple Vitamin (MULTIVITAMIN) tablet Take 1 tablet by mouth daily.        . SUMAtriptan (IMITREX) 25 MG tablet Take 25 mg by mouth as needed. rare       . traMADol (ULTRAM) 50 MG tablet Take 100 mg by mouth 2 (two) times daily.        Marland Kitchen augmented betamethasone dipropionate (DIPROLENE-AF) 0.05 % ointment        No Known Allergies  History   Social History  . Marital Status: Legally Separated    Spouse Name: N/A    Number of Children: 1  . Years of Education: N/A   Occupational History  .      Account manager for [employer]   Social History Main Topics  . Smoking status: Current Everyday  Smoker -- 0.2 packs/day for 7 years    Types: Cigarettes  . Smokeless tobacco: Never Used  . Alcohol Use: 1.8 oz/week    3 Glasses of wine per week     1x week  . Drug Use: No  . Sexually Active: Not on file   Other Topics Concern  . Not on file     . No narrative on file     Review of Systems  Constitutional: Positive for activity change and fatigue.  HENT: Negative.   Eyes: Negative.   Respiratory: Negative.   Cardiovascular: Negative.   Gastrointestinal: Positive for abdominal pain, constipation and abdominal distention. Negative for nausea, vomiting, diarrhea and blood in stool.  Genitourinary: Negative.   Musculoskeletal: Positive for myalgias, back pain and arthralgias.  Skin: Negative.   Neurological: Negative.   Psychiatric/Behavioral: Positive for dysphoric mood.       Objective:   Physical Exam BP 116/70  Pulse 69   Temp(Src) 97.4 F (36.3 C) (Temporal)  Ht 5\' 8"  (1.727 m)  Wt 252 lb 9.6 oz (114.579 kg)  BMI 38.41 kg/m2  SpO2 98% General: Alert, morbidly obese African American female, in no distress Skin: Warm and dry without rash or infection. HEENT: No palpable masses or thyromegaly. Sclera nonicteric. Pupils equal round and reactive. Oropharynx clear. Lymph nodes: No cervical, supraclavicular, or inguinal nodes palpable. Lungs: Breath sounds clear and equal without increased work of breathing Cardiovascular: Regular rate and rhythm without murmur. No JVD or edema. Peripheral pulses intact. Abdomen: Nondistended. Soft and nontender. No masses palpable. No organomegaly. No palpable hernias.  Well healed transverse panniculectomy incision Extremities: No edema or joint swelling or deformity. No chronic venous stasis changes. Neurologic: Alert and fully oriented. Gait normal.     Assessment:     Patient with progressive morbid obesity unresponsive to multiple efforts at medical management who presents with a BMI of 38 and comorbidities of Chronic joint pain arthritis, hypertension, pre diabetes and history of fatty liver. I believe there would be very significant medical benefit from surgical weight loss. After our discussion of surgical options currently available the patient has decided to proceed with laparoscopic Roux-en-Y gastric bypass due to the reasons above. We have discussed the nature of the problem and the risks of remaining morbidly obese. We discussed laparoscopic Roux-en-Y gastric bypass in detail including the nature of the procedure, expected hospitalization and recovery, and major risks of anesthetic complications, bleeding, blood clots and pulmonary emboli, leakage and infection and long-term risks of stricture, ulceration, bowel obstruction, nutritional deficiencies, and failure to lose weight or weight regain. We discussed these problems could lead to death. The patient was given a complete  consent form to review and all questions were answered. We will go ahead with preoperative including psychological and nutrition evaluations, H. pylori testing, ultrasound, bone density, and routine lab and x-rays. I will see the patient back following these studies. We discussed that she will need to be completely off the cigarettes for 2 months prior to her preoperative visit and that her Depo-Provera will need to be stopped 2 months at a time.    Plan:     Proceed with workup for possible gastric bypass as detailed

## 2011-06-25 ENCOUNTER — Encounter (INDEPENDENT_AMBULATORY_CARE_PROVIDER_SITE_OTHER): Payer: Self-pay

## 2011-07-11 ENCOUNTER — Encounter (HOSPITAL_COMMUNITY): Payer: Self-pay

## 2011-07-11 ENCOUNTER — Ambulatory Visit (HOSPITAL_COMMUNITY)
Admission: RE | Admit: 2011-07-11 | Discharge: 2011-07-11 | Disposition: A | Discharge status: Home / Self Care | Payer: BC Managed Care – PPO | Source: Ambulatory Visit | Attending: General Surgery | Admitting: General Surgery

## 2011-07-11 ENCOUNTER — Encounter (HOSPITAL_COMMUNITY)
Admission: RE | Disposition: A | Discharge status: Home / Self Care | Payer: Self-pay | Source: Ambulatory Visit | Attending: General Surgery

## 2011-07-11 DIAGNOSIS — Z01818 Encounter for other preprocedural examination: Secondary | ICD-10-CM | POA: Insufficient documentation

## 2011-07-11 HISTORY — PX: BREATH TEK H PYLORI: SHX5422

## 2011-07-11 SURGERY — BREATH TEST, FOR HELICOBACTER PYLORI

## 2011-07-12 ENCOUNTER — Encounter (HOSPITAL_COMMUNITY): Payer: Self-pay | Admitting: General Surgery

## 2011-07-13 ENCOUNTER — Other Ambulatory Visit (INDEPENDENT_AMBULATORY_CARE_PROVIDER_SITE_OTHER): Payer: Self-pay

## 2011-07-16 ENCOUNTER — Ambulatory Visit (HOSPITAL_COMMUNITY)
Admission: RE | Admit: 2011-07-16 | Discharge: 2011-07-16 | Disposition: A | Discharge status: Home / Self Care | Payer: BC Managed Care – PPO | Source: Ambulatory Visit | Attending: General Surgery | Admitting: General Surgery

## 2011-07-16 ENCOUNTER — Other Ambulatory Visit: Payer: Self-pay

## 2011-07-16 DIAGNOSIS — K589 Irritable bowel syndrome without diarrhea: Secondary | ICD-10-CM

## 2011-07-16 DIAGNOSIS — IMO0001 Reserved for inherently not codable concepts without codable children: Secondary | ICD-10-CM | POA: Insufficient documentation

## 2011-07-16 DIAGNOSIS — D649 Anemia, unspecified: Secondary | ICD-10-CM

## 2011-07-16 DIAGNOSIS — I10 Essential (primary) hypertension: Secondary | ICD-10-CM

## 2011-07-16 DIAGNOSIS — E119 Type 2 diabetes mellitus without complications: Secondary | ICD-10-CM | POA: Insufficient documentation

## 2011-07-16 DIAGNOSIS — Z6838 Body mass index (BMI) 38.0-38.9, adult: Secondary | ICD-10-CM | POA: Insufficient documentation

## 2011-07-16 DIAGNOSIS — M1990 Unspecified osteoarthritis, unspecified site: Secondary | ICD-10-CM

## 2011-07-16 DIAGNOSIS — M129 Arthropathy, unspecified: Secondary | ICD-10-CM | POA: Insufficient documentation

## 2011-07-16 DIAGNOSIS — K8020 Calculus of gallbladder without cholecystitis without obstruction: Secondary | ICD-10-CM | POA: Insufficient documentation

## 2011-07-16 DIAGNOSIS — Z13820 Encounter for screening for osteoporosis: Secondary | ICD-10-CM | POA: Insufficient documentation

## 2011-07-16 DIAGNOSIS — N200 Calculus of kidney: Secondary | ICD-10-CM | POA: Insufficient documentation

## 2011-07-16 DIAGNOSIS — K7689 Other specified diseases of liver: Secondary | ICD-10-CM | POA: Insufficient documentation

## 2011-07-16 LAB — CBC WITH DIFFERENTIAL/PLATELET
Basophils Relative: 0 % (ref 0–1)
Eosinophils Absolute: 0 10*3/uL (ref 0.0–0.7)
Eosinophils Relative: 1 % (ref 0–5)
HCT: 39.3 % (ref 36.0–46.0)
Hemoglobin: 13.7 g/dL (ref 12.0–15.0)
Lymphs Abs: 2.8 10*3/uL (ref 0.7–4.0)
MCH: 29.7 pg (ref 26.0–34.0)
MCHC: 34.9 g/dL (ref 30.0–36.0)
MCV: 85.1 fL (ref 78.0–100.0)
Monocytes Absolute: 0.7 10*3/uL (ref 0.1–1.0)
Monocytes Relative: 9 % (ref 3–12)
RBC: 4.62 MIL/uL (ref 3.87–5.11)

## 2011-07-16 LAB — COMPREHENSIVE METABOLIC PANEL
Alkaline Phosphatase: 115 U/L (ref 39–117)
BUN: 11 mg/dL (ref 6–23)
CO2: 23 mEq/L (ref 19–32)
Glucose, Bld: 86 mg/dL (ref 70–99)
Total Bilirubin: 0.4 mg/dL (ref 0.3–1.2)
Total Protein: 6.9 g/dL (ref 6.0–8.3)

## 2011-07-16 LAB — TSH: TSH: 1.545 u[IU]/mL (ref 0.350–4.500)

## 2011-07-16 LAB — LIPID PANEL
Cholesterol: 131 mg/dL (ref 0–200)
Triglycerides: 66 mg/dL (ref <150)
VLDL: 13 mg/dL (ref 0–40)

## 2011-07-17 ENCOUNTER — Other Ambulatory Visit (INDEPENDENT_AMBULATORY_CARE_PROVIDER_SITE_OTHER): Payer: Self-pay

## 2011-07-17 ENCOUNTER — Telehealth (INDEPENDENT_AMBULATORY_CARE_PROVIDER_SITE_OTHER): Payer: Self-pay

## 2011-07-17 DIAGNOSIS — B3731 Acute candidiasis of vulva and vagina: Secondary | ICD-10-CM

## 2011-07-17 DIAGNOSIS — B373 Candidiasis of vulva and vagina: Secondary | ICD-10-CM

## 2011-07-17 MED ORDER — FLUCONAZOLE 200 MG PO TABS: 200.0000 mg | ORAL_TABLET | Freq: Every day | ORAL | Status: AC

## 2011-07-17 NOTE — Telephone Encounter (Signed)
Pt is requesting her bone density study results from yesterday. Pls call pt today b/c she is going to her medical doctor who is requesting the results today. [phone]

## 2011-07-17 NOTE — Telephone Encounter (Signed)
Bone density results given to patient (normal).  Patient requested an rx for Diflucan due to the antibiotics for tx of her H-Pylori.  Reviewed with Dr. Johna Sheriff, rx for Diflucan 200 mg, take one by mouth, 0 refill.  Called in to [facility] on [address].

## 2011-07-18 ENCOUNTER — Encounter: Payer: Self-pay | Admitting: *Deleted

## 2011-07-18 ENCOUNTER — Encounter: Payer: BC Managed Care – PPO | Attending: General Surgery | Admitting: *Deleted

## 2011-07-18 DIAGNOSIS — Z01818 Encounter for other preprocedural examination: Secondary | ICD-10-CM | POA: Insufficient documentation

## 2011-07-18 DIAGNOSIS — Z713 Dietary counseling and surveillance: Secondary | ICD-10-CM | POA: Insufficient documentation

## 2011-07-18 NOTE — Patient Instructions (Addendum)
   Follow Pre-Op Nutrition Goals to prepare for Gastric Bypass Surgery.   Call the [facility] at [phone] once you have been given your surgery date to enrolled in the Pre-Op Nutrition Class. You will need to attend this nutrition class 3-4 weeks prior to your surgery. 

## 2011-07-18 NOTE — Progress Notes (Addendum)
  Pre-Op Assessment Visit:  Pre-Operative RYGB Surgery  Medical Nutrition Therapy:  Appt start time: 0830   End time:  0930.  Patient was seen on 07/18/2011 for Pre-Operative RYGB Nutrition Assessment. Assessment and letter of approval faxed to [facility] Bariatric Surgery Program coordinator on 07/20/2011.  Approval letter sent to [facility] center and will be available in the chart under the media tab.  TANITA  BODY COMP RESULTS  07/18/11   %Fat 50.8%   Fat Mass (lbs) 130.5   Fat Free Mass (lbs) 126.0   Total Body Water (lbs) 92.0   Handouts given during visit include:  Pre-Op Goals   Bariatric Surgery Protein Shakes handout  Patient to call for Pre-Op and Post-Op Nutrition Education at the [facility] when surgery is scheduled.

## 2011-08-30 ENCOUNTER — Other Ambulatory Visit (INDEPENDENT_AMBULATORY_CARE_PROVIDER_SITE_OTHER): Payer: Self-pay | Admitting: General Surgery

## 2011-09-20 ENCOUNTER — Encounter: Payer: Self-pay | Admitting: *Deleted

## 2011-09-20 ENCOUNTER — Encounter: Payer: BC Managed Care – PPO | Attending: General Surgery | Admitting: *Deleted

## 2011-09-20 DIAGNOSIS — Z713 Dietary counseling and surveillance: Secondary | ICD-10-CM | POA: Insufficient documentation

## 2011-09-20 DIAGNOSIS — Z01818 Encounter for other preprocedural examination: Secondary | ICD-10-CM | POA: Insufficient documentation

## 2011-09-20 NOTE — Patient Instructions (Signed)
Follow:   Pre-Op Diet per MD 2 weeks prior to surgery  Phase 2- Liquids (clear/full) 2 weeks after surgery  Vitamin/Mineral/Calcium guidelines for purchasing bariatric supplements  Exercise guidelines pre and post-op per MD  Follow-up at [facility] in 2 weeks post-op for diet advancement. Contact Toni Hoffmeister as needed with questions/concerns. 

## 2011-09-20 NOTE — Progress Notes (Signed)
Bariatric Class:  Appt start time: 1730 end time:  1830.  Pre-Operative Nutrition Class  Patient was seen on 09/20/2011 for Pre-Operative Bariatric Surgery Education at the [facility].   Surgery date: 10/01/11 Surgery type: RYGB Start weight at [facility] 256.5 lbs  Weight today: 248.7 lbs  Samples given per MNT protocol: Bariatric Advantage Multivitamin  Lot # [id number]  Exp: 12/13   Opurity Calcium Citrate  Lot # [id number]  Exp: 11/14   Opurity Bypass/Sleeve MVI  Lot # [id number] Exp: 11/14   Celebrate Vitamins Multivitamin (2)  Lot # [id number]; [id number]  Exp: 09/14; 01/15   Celebrate Vitamins Multivitamin-Complete  Lot # [id number]  Exp: 06/14   Celebrate Vitamins Iron + C (18mg )  Lot # [id number]  Exp: 03/15   Celebrate Vitamins Sublingual B12  Lot # [id number]  Exp: 05/15   Corliss Marcus Protein Powder  Lot # [id number]  Exp: 09/14   The following the learning objective met by the patient during this course:  Identifies Pre-Op Dietary Goals and will begin 2 weeks pre-operatively  Identifies appropriate sources of fluids and proteins   States protein recommendations and appropriate sources pre and post-operatively  Identifies Post-Operative Dietary Goals and will follow for 2 weeks post-operatively  Identifies appropriate multivitamin and calcium sources  Describes the need for physical activity post-operatively and will follow MD recommendations  States when to call healthcare provider regarding medication questions or post-operative complications  Handouts given during class include:  Pre-Op Bariatric Surgery Diet Handout  Protein Shake Handout  Post-Op Bariatric Surgery Nutrition Handout  BELT Program Information Flyer  Support Group Information Flyer  Follow-Up Plan: Patient will follow-up at [facility] 2 weeks post operatively for diet advancement per MD.

## 2011-09-21 ENCOUNTER — Encounter (HOSPITAL_COMMUNITY): Payer: Self-pay | Admitting: Pharmacy Technician

## 2011-09-27 ENCOUNTER — Ambulatory Visit (INDEPENDENT_AMBULATORY_CARE_PROVIDER_SITE_OTHER): Payer: BC Managed Care – PPO | Admitting: General Surgery

## 2011-09-27 ENCOUNTER — Encounter (INDEPENDENT_AMBULATORY_CARE_PROVIDER_SITE_OTHER): Payer: Self-pay | Admitting: General Surgery

## 2011-09-27 NOTE — Progress Notes (Signed)
EKG 07-16-2011 EPIC CHEST 2 VIEW XRAY 07-16-2011 EPIC STRESS TEST 11-03-2010 EPIC

## 2011-09-27 NOTE — Patient Instructions (Signed)
Stick with your preop diet and remain active until surgery

## 2011-09-27 NOTE — Progress Notes (Signed)
Chief complaint: Preop visit for gastric bypass  History: Patient returns to the office for a preop visit prior to planned laparoscopic Roux-en-Y gastric bypass and cholecystectomy. She has a long history of progressive morbid obesity unresponsive to medical management and has comorbidities of chronic joint pain and arthritis, hypertension, and pre diabetes. She has had also some episodes of upper abdominal pain and her preoperative workup has revealed cholelithiasis. We had previously discussed that we would plan to remove her gallbladder at the time of her gastric bypass. She has successfully completed all her preop studies. She has quit cigarettes in been off these for 4 weeks. She is off of her birth control pills. She is on her preop diet and doing well with this.  Past Medical History  Diagnosis Date  . Hypertension   . Nephrolithiasis   . IBS (irritable bowel syndrome)   . Anemia   . Arthritis   . Fibromyalgia   . Diabetes mellitus     Prediabetes   Past Surgical History  Procedure Date  . Left shoulder arthroscopic surgery   . Tummy tuck 2005  . Breath tek h pylori 07/11/2011    Procedure: BREATH TEK H PYLORI;  Surgeon: Mariella Saa, MD;  Location: [facility];  Service: General;  Laterality: N/A;   Current Outpatient Prescriptions  Medication Sig Dispense Refill  . ALPRAZolam (XANAX) 1 MG tablet Take 1.5 mg by mouth at bedtime as needed. Anxiety/sleep      . Ascorbic Acid (VITAMIN C PO) Take 300 mg by mouth daily.       Marland Kitchen augmented betamethasone dipropionate (DIPROLENE-AF) 0.05 % ointment Apply 1 application topically as needed. Rash      . B Complex-C (B-COMPLEX WITH VITAMIN C) tablet Take 1 tablet by mouth daily.      . Cholecalciferol (VITAMIN D3) 10000 UNITS capsule Take 10,000 Units by mouth daily.      . Cyanocobalamin (VITAMIN B-12) 2500 MCG SUBL Place 1 tablet under the tongue 2 (two) times daily.      . ferrous sulfate 325 (65 FE) MG tablet Take 325 mg by mouth  daily with breakfast.      . lisinopril-hydrochlorothiazide (PRINZIDE,ZESTORETIC) 20-12.5 MG per tablet Take 1 tablet by mouth daily with breakfast.       . metoCLOPramide (REGLAN) 10 MG tablet Take 10 mg by mouth as needed. IBS      . traMADol (ULTRAM) 50 MG tablet Take 100 mg by mouth 2 (two) times daily.        Marland Kitchen acidophilus (RISAQUAD) CAPS Take 1 capsule by mouth daily.      . diphenhydrAMINE (BENADRYL) 25 mg capsule Take 25 mg by mouth every 6 (six) hours as needed.        Marland Kitchen ibuprofen (ADVIL,MOTRIN) 200 MG tablet Take 600 mg by mouth every 6 (six) hours as needed. Pain      . medroxyPROGESTERone (DEPO-PROVERA) 150 MG/ML injection Inject 150 mg into the muscle every 3 (three) months.         No Known Allergies  Exam: BP 132/84  Pulse 75  Temp 97.5 F (36.4 C) (Temporal)  Ht 5\' 8"  (1.727 m)  Wt 246 lb 12.8 oz (111.948 kg)  BMI 37.53 kg/m2  SpO2 98% General: Obese but otherwise well-appearing African female Skin: Warm and dry no rash or infection Lymph nodes: No cervical, supravesicular or inguinal nodes palpable Lungs: Clear without wheezing or increased work of breathing Cardiovascular: Regular rate and rhythm. No edema Abdomen: Soft  and nontender. No discernible masses or organomegaly Extremities: No joint swelling or deformity  Assessment and plan: Morbid obesity with comorbidities as above. We reviewed all her preoperative studies. She has been through the consent form and we reviewed this again that all her questions were answered. She is ready for surgery next week. She was given a bowel prep and instructions today.

## 2011-09-28 ENCOUNTER — Encounter (HOSPITAL_COMMUNITY): Payer: Self-pay

## 2011-09-28 ENCOUNTER — Encounter (HOSPITAL_COMMUNITY)
Admission: RE | Admit: 2011-09-28 | Discharge: 2011-09-28 | Disposition: A | Discharge status: Home / Self Care | Payer: BC Managed Care – PPO | Source: Ambulatory Visit | Attending: General Surgery | Admitting: General Surgery

## 2011-09-28 HISTORY — DX: Plantar fascial fibromatosis: M72.2

## 2011-09-28 LAB — CBC
HCT: 40.7 % (ref 36.0–46.0)
Hemoglobin: 13.7 g/dL (ref 12.0–15.0)
MCH: 28.6 pg (ref 26.0–34.0)
RBC: 4.79 MIL/uL (ref 3.87–5.11)

## 2011-09-28 LAB — COMPREHENSIVE METABOLIC PANEL
AST: 15 U/L (ref 0–37)
Albumin: 4.2 g/dL (ref 3.5–5.2)
CO2: 25 mEq/L (ref 19–32)
Calcium: 9.7 mg/dL (ref 8.4–10.5)
Creatinine, Ser: 0.69 mg/dL (ref 0.50–1.10)
GFR calc non Af Amer: >90 mL/min (ref >90)
Total Protein: 7.5 g/dL (ref 6.0–8.3)

## 2011-09-28 LAB — SURGICAL PCR SCREEN: MRSA, PCR: NEGATIVE

## 2011-09-28 NOTE — Patient Instructions (Addendum)
20 Cabela A Begeman  09/28/2011   Your procedure is scheduled on:  10-01-2011  Report to [facility] at  0515 AM.  Call this number if you have problems the morning of surgery: [phone]   Remember:   Do not eat food or drink liquids:After Midnight.  .  Take these medicines the morning of surgery with A SIP OF WATER:no meds to take   Do not wear jewelry or make up.  Do not wear lotions, powders, or perfumes.Do not wear deodorant.    Do not bring valuables to the hospital.  Contacts, dentures or bridgework may not be worn into surgery.  Leave suitcase in the car. After surgery it may be brought to your room.  For patients admitted to the hospital, checkout time is 11:00 AM the day of discharge                             Patients discharged the day of surgery will not be allowed to drive home. If going home same day of surgery, you must have someone stay with you the first 24 hours at home and arrange for some one to drive you home from hospital.    Special Instructions: CHG Shower Use Special Wash: 1/2 bottle night before surgery and 1/2 bottle morning  of surgery, use regular soap on face and front and back private area. Women do not shave legs or underarms for 2 days before showers. Men may shave face morning of surgery.    Please read over the following fact sheets that you were given: MRSA Information  Cain S[facility] pre op nurse phone number [phone], call if needed

## 2011-09-30 NOTE — Anesthesia Preprocedure Evaluation (Addendum)
Anesthesia Evaluation  Patient identified by MRN, date of birth, ID band Patient awake    Reviewed: Allergy & Precautions, H&P , NPO status , Patient's Chart, lab work & pertinent test results  Airway Mallampati: II TM Distance: >3 FB Neck ROM: full    Dental  (+) Dental Advisory Given and Caps,    Pulmonary neg pulmonary ROS, shortness of breath and with exertion,  breath sounds clear to auscultation  Pulmonary exam normal       Cardiovascular Exercise Tolerance: Poor hypertension, Pt. on medications Rhythm:regular Rate:Normal - Friction Rub    Neuro/Psych Anxiety negative neurological ROS  negative psych ROS   GI/Hepatic negative GI ROS, Neg liver ROS,   Endo/Other  negative endocrine ROSdiabetesMorbid obesityPre diabetes  Renal/GU negative Renal ROS  negative genitourinary   Musculoskeletal  (+) Fibromyalgia -  Abdominal   Peds  Hematology negative hematology ROS (+)   Anesthesia Other Findings   Reproductive/Obstetrics negative OB ROS                          Anesthesia Physical Anesthesia Plan  ASA: III  Anesthesia Plan: General   Post-op Pain Management:    Induction: Intravenous  Airway Management Planned: Oral ETT  Additional Equipment:   Intra-op Plan:   Post-operative Plan: Extubation in OR  Informed Consent: I have reviewed the patients History and Physical, chart, labs and discussed the procedure including the risks, benefits and alternatives for the proposed anesthesia with the patient or authorized representative who has indicated his/her understanding and acceptance.   Dental Advisory Given  Plan Discussed with: CRNA and Surgeon  Anesthesia Plan Comments:         Anesthesia Quick Evaluation

## 2011-10-01 ENCOUNTER — Ambulatory Visit (HOSPITAL_COMMUNITY): Payer: BC Managed Care – PPO | Admitting: Anesthesiology

## 2011-10-01 ENCOUNTER — Inpatient Hospital Stay (HOSPITAL_COMMUNITY)
Admission: RE | Admit: 2011-10-01 | Discharge: 2011-10-03 | DRG: 288 | Disposition: A | Discharge status: Home / Self Care | Payer: BC Managed Care – PPO | Source: Ambulatory Visit | Attending: General Surgery | Admitting: General Surgery

## 2011-10-01 ENCOUNTER — Ambulatory Visit (HOSPITAL_COMMUNITY): Payer: BC Managed Care – PPO

## 2011-10-01 ENCOUNTER — Encounter (HOSPITAL_COMMUNITY): Payer: Self-pay | Admitting: Anesthesiology

## 2011-10-01 ENCOUNTER — Encounter (HOSPITAL_COMMUNITY)
Admission: RE | Disposition: A | Discharge status: Home / Self Care | Payer: Self-pay | Source: Ambulatory Visit | Attending: General Surgery

## 2011-10-01 ENCOUNTER — Encounter (HOSPITAL_COMMUNITY): Payer: Self-pay | Admitting: Surgery

## 2011-10-01 ENCOUNTER — Encounter (HOSPITAL_COMMUNITY): Payer: Self-pay | Admitting: *Deleted

## 2011-10-01 DIAGNOSIS — IMO0001 Reserved for inherently not codable concepts without codable children: Secondary | ICD-10-CM | POA: Diagnosis present

## 2011-10-01 DIAGNOSIS — F411 Generalized anxiety disorder: Secondary | ICD-10-CM | POA: Diagnosis present

## 2011-10-01 DIAGNOSIS — E119 Type 2 diabetes mellitus without complications: Secondary | ICD-10-CM | POA: Diagnosis present

## 2011-10-01 DIAGNOSIS — I10 Essential (primary) hypertension: Secondary | ICD-10-CM

## 2011-10-01 DIAGNOSIS — K8020 Calculus of gallbladder without cholecystitis without obstruction: Secondary | ICD-10-CM | POA: Diagnosis present

## 2011-10-01 DIAGNOSIS — Z6837 Body mass index (BMI) 37.0-37.9, adult: Secondary | ICD-10-CM

## 2011-10-01 DIAGNOSIS — Z01812 Encounter for preprocedural laboratory examination: Secondary | ICD-10-CM

## 2011-10-01 HISTORY — PX: CHOLECYSTECTOMY: SHX55

## 2011-10-01 HISTORY — PX: GASTRIC ROUX-EN-Y: SHX5262

## 2011-10-01 LAB — PREGNANCY, URINE: Preg Test, Ur: NEGATIVE

## 2011-10-01 SURGERY — LAPAROSCOPIC ROUX-EN-Y GASTRIC
Anesthesia: General | Site: Esophagus | Wound class: Clean Contaminated

## 2011-10-01 MED ORDER — HYDROMORPHONE HCL PF 1 MG/ML IJ SOLN
0.2500 mg | INTRAMUSCULAR | Status: DC | PRN
  Administered 2011-10-01 (×2): 0.25 mg via INTRAVENOUS

## 2011-10-01 MED ORDER — UNJURY VANILLA POWDER
2.0000 [oz_av] | Freq: Four times a day (QID) | ORAL | Status: DC
  Administered 2011-10-03: 2 [oz_av] via ORAL

## 2011-10-01 MED ORDER — INFLUENZA VIRUS VACC SPLIT PF IM SUSP
0.5000 mL | INTRAMUSCULAR | Status: AC
  Administered 2011-10-02: 0.5 mL via INTRAMUSCULAR
  Filled 2011-10-01: qty 0.5

## 2011-10-01 MED ORDER — IOHEXOL 300 MG/ML  SOLN
INTRAMUSCULAR | Status: DC | PRN
  Administered 2011-10-01: 10 mL via INTRAVENOUS

## 2011-10-01 MED ORDER — ACETAMINOPHEN 160 MG/5ML PO SOLN: 650.0000 mg | ORAL | Status: DC | PRN

## 2011-10-01 MED ORDER — BUPIVACAINE-EPINEPHRINE 0.25% -1:200000 IJ SOLN
INTRAMUSCULAR | Status: DC | PRN
  Administered 2011-10-01: 35 mL

## 2011-10-01 MED ORDER — OXYCODONE-ACETAMINOPHEN 5-325 MG/5ML PO SOLN
5.0000 mL | ORAL | Status: DC | PRN
  Administered 2011-10-02 – 2011-10-03 (×4): 10 mL via ORAL
  Administered 2011-10-03: 5 mL via ORAL
  Administered 2011-10-03: 10 mL via ORAL
  Filled 2011-10-01 (×6): qty 10

## 2011-10-01 MED ORDER — ONDANSETRON HCL 4 MG/2ML IJ SOLN
INTRAMUSCULAR | Status: DC | PRN
  Administered 2011-10-01 (×2): 2 mg via INTRAVENOUS

## 2011-10-01 MED ORDER — LACTATED RINGERS IV SOLN
INTRAVENOUS | Status: DC | PRN
  Administered 2011-10-01 (×2): via INTRAVENOUS

## 2011-10-01 MED ORDER — BUPIVACAINE-EPINEPHRINE 0.25% -1:200000 IJ SOLN
INTRAMUSCULAR | Status: AC
  Filled 2011-10-01: qty 1

## 2011-10-01 MED ORDER — LACTATED RINGERS IR SOLN
Status: DC | PRN
  Administered 2011-10-01: 3000 mL

## 2011-10-01 MED ORDER — UNJURY CHOCOLATE CLASSIC POWDER: 2.0000 [oz_av] | Freq: Four times a day (QID) | ORAL | Status: DC

## 2011-10-01 MED ORDER — LABETALOL HCL 5 MG/ML IV SOLN
INTRAVENOUS | Status: DC | PRN
  Administered 2011-10-01: 5 mg via INTRAVENOUS

## 2011-10-01 MED ORDER — LIDOCAINE HCL (CARDIAC) 20 MG/ML IV SOLN
INTRAVENOUS | Status: DC | PRN
  Administered 2011-10-01: 100 mg via INTRAVENOUS

## 2011-10-01 MED ORDER — DEXAMETHASONE SODIUM PHOSPHATE 10 MG/ML IJ SOLN
INTRAMUSCULAR | Status: DC | PRN
  Administered 2011-10-01: 10 mg via INTRAVENOUS

## 2011-10-01 MED ORDER — PROMETHAZINE HCL 25 MG/ML IJ SOLN
12.5000 mg | INTRAMUSCULAR | Status: DC | PRN
  Administered 2011-10-01: 6.25 mg via INTRAVENOUS

## 2011-10-01 MED ORDER — MIDAZOLAM HCL 5 MG/5ML IJ SOLN
INTRAMUSCULAR | Status: DC | PRN
  Administered 2011-10-01 (×2): 1 mg via INTRAVENOUS

## 2011-10-01 MED ORDER — LACTATED RINGERS IV SOLN: INTRAVENOUS | Status: DC

## 2011-10-01 MED ORDER — DEXTROSE 5 % IV SOLN
2.0000 g | INTRAVENOUS | Status: AC
  Administered 2011-10-01: 2 g via INTRAVENOUS

## 2011-10-01 MED ORDER — CEFOXITIN SODIUM-DEXTROSE 1-4 GM-% IV SOLR (PREMIX)
INTRAVENOUS | Status: AC
  Filled 2011-10-01: qty 100

## 2011-10-01 MED ORDER — NEOSTIGMINE METHYLSULFATE 1 MG/ML IJ SOLN
INTRAMUSCULAR | Status: DC | PRN
  Administered 2011-10-01: 5 mg via INTRAVENOUS

## 2011-10-01 MED ORDER — UNJURY CHICKEN SOUP POWDER: 2.0000 [oz_av] | Freq: Four times a day (QID) | ORAL | Status: DC

## 2011-10-01 MED ORDER — GLYCOPYRROLATE 0.2 MG/ML IJ SOLN
INTRAMUSCULAR | Status: DC | PRN
  Administered 2011-10-01: .8 mg via INTRAVENOUS

## 2011-10-01 MED ORDER — HEPARIN SODIUM (PORCINE) 5000 UNIT/ML IJ SOLN
5000.0000 [IU] | Freq: Three times a day (TID) | INTRAMUSCULAR | Status: DC
  Administered 2011-10-01 – 2011-10-03 (×5): 5000 [IU] via SUBCUTANEOUS
  Filled 2011-10-01 (×9): qty 1

## 2011-10-01 MED ORDER — ONDANSETRON HCL 4 MG/2ML IJ SOLN
4.0000 mg | INTRAMUSCULAR | Status: DC | PRN
  Administered 2011-10-01 – 2011-10-02 (×7): 4 mg via INTRAVENOUS
  Filled 2011-10-01 (×6): qty 2

## 2011-10-01 MED ORDER — IOHEXOL 300 MG/ML  SOLN
INTRAMUSCULAR | Status: AC
  Filled 2011-10-01: qty 1

## 2011-10-01 MED ORDER — HEPARIN SODIUM (PORCINE) 5000 UNIT/ML IJ SOLN
5000.0000 [IU] | INTRAMUSCULAR | Status: AC
  Administered 2011-10-01: 5000 [IU] via SUBCUTANEOUS
  Filled 2011-10-01: qty 1

## 2011-10-01 MED ORDER — PROMETHAZINE HCL 25 MG/ML IJ SOLN
INTRAMUSCULAR | Status: AC
  Filled 2011-10-01: qty 1

## 2011-10-01 MED ORDER — HYDROMORPHONE HCL PF 1 MG/ML IJ SOLN
INTRAMUSCULAR | Status: AC
  Filled 2011-10-01: qty 1

## 2011-10-01 MED ORDER — TISSEEL VH 10 ML EX KIT
PACK | CUTANEOUS | Status: DC | PRN
  Administered 2011-10-01: 10 mL

## 2011-10-01 MED ORDER — KCL IN DEXTROSE-NACL 20-5-0.9 MEQ/L-%-% IV SOLN
INTRAVENOUS | Status: DC
  Administered 2011-10-01 – 2011-10-03 (×4): via INTRAVENOUS
  Filled 2011-10-01 (×8): qty 1000

## 2011-10-01 MED ORDER — FENTANYL CITRATE 0.05 MG/ML IJ SOLN
INTRAMUSCULAR | Status: DC | PRN
  Administered 2011-10-01: 200 ug via INTRAVENOUS
  Administered 2011-10-01: 100 ug via INTRAVENOUS
  Administered 2011-10-01 (×9): 50 ug via INTRAVENOUS

## 2011-10-01 MED ORDER — ACETAMINOPHEN 10 MG/ML IV SOLN
INTRAVENOUS | Status: AC
  Filled 2011-10-01: qty 100

## 2011-10-01 MED ORDER — MORPHINE SULFATE 2 MG/ML IJ SOLN
2.0000 mg | INTRAMUSCULAR | Status: DC | PRN
  Administered 2011-10-01 – 2011-10-02 (×15): 4 mg via INTRAVENOUS
  Filled 2011-10-01 (×15): qty 2

## 2011-10-01 MED ORDER — SUCCINYLCHOLINE CHLORIDE 20 MG/ML IJ SOLN
INTRAMUSCULAR | Status: DC | PRN
  Administered 2011-10-01: 140 mg via INTRAVENOUS

## 2011-10-01 MED ORDER — ACETAMINOPHEN 10 MG/ML IV SOLN
INTRAVENOUS | Status: DC | PRN
  Administered 2011-10-01: 1000 mg via INTRAVENOUS

## 2011-10-01 MED ORDER — LACTATED RINGERS IV SOLN
INTRAVENOUS | Status: DC | PRN
  Administered 2011-10-01: 1000 mL via INTRAUTERINE

## 2011-10-01 MED ORDER — PROPOFOL 10 MG/ML IV EMUL
INTRAVENOUS | Status: DC | PRN
  Administered 2011-10-01: 25 mg via INTRAVENOUS
  Administered 2011-10-01: 200 mg via INTRAVENOUS

## 2011-10-01 MED ORDER — TISSEEL VH 10 ML EX KIT
PACK | CUTANEOUS | Status: AC
  Filled 2011-10-01: qty 2

## 2011-10-01 MED ORDER — PNEUMOCOCCAL VAC POLYVALENT 25 MCG/0.5ML IJ INJ
0.5000 mL | INJECTION | INTRAMUSCULAR | Status: AC
  Administered 2011-10-02: 0.5 mL via INTRAMUSCULAR
  Filled 2011-10-01: qty 0.5

## 2011-10-01 MED ORDER — CISATRACURIUM BESYLATE (PF) 10 MG/5ML IV SOLN
INTRAVENOUS | Status: DC | PRN
  Administered 2011-10-01 (×2): 2 mg via INTRAVENOUS
  Administered 2011-10-01: 10 mg via INTRAVENOUS
  Administered 2011-10-01 (×2): 2 mg via INTRAVENOUS

## 2011-10-01 SURGICAL SUPPLY — 90 items
APPLICATOR COTTON TIP 6IN STRL (MISCELLANEOUS) IMPLANT
APPLIER CLIP ROT 10 11.4 M/L (STAPLE) ×3
APPLIER CLIP ROT 13.4 12 LRG (CLIP)
BENZOIN TINCTURE PRP APPL 2/3 (GAUZE/BANDAGES/DRESSINGS) IMPLANT
BLADE SURG 15 STRL LF DISP TIS (BLADE) ×2 IMPLANT
BLADE SURG 15 STRL SS (BLADE) ×1
BLADE SURG SZ11 CARB STEEL (BLADE) ×3 IMPLANT
CABLE HIGH FREQUENCY MONO STRZ (ELECTRODE) ×3 IMPLANT
CANISTER SUCTION 2500CC (MISCELLANEOUS) ×3 IMPLANT
CATH REDDICK CHOLANGI 4FR 50CM (CATHETERS) IMPLANT
CHLORAPREP W/TINT 26ML (MISCELLANEOUS) ×6 IMPLANT
CLIP APPLIE ROT 10 11.4 M/L (STAPLE) ×2 IMPLANT
CLIP APPLIE ROT 13.4 12 LRG (CLIP) IMPLANT
CLIP SUT LAPRA TY ABSORB (SUTURE) ×6 IMPLANT
CLOTH BEACON ORANGE TIMEOUT ST (SAFETY) ×3 IMPLANT
COVER MAYO STAND STRL (DRAPES) ×3 IMPLANT
COVER SURGICAL LIGHT HANDLE (MISCELLANEOUS) ×3 IMPLANT
CUTTER LINEAR ENDO ART 45 ETS (STAPLE) ×3 IMPLANT
DECANTER SPIKE VIAL GLASS SM (MISCELLANEOUS) ×3 IMPLANT
DERMABOND ADVANCED (GAUZE/BANDAGES/DRESSINGS) ×2
DERMABOND ADVANCED .7 DNX12 (GAUZE/BANDAGES/DRESSINGS) ×4 IMPLANT
DEVICE SUTURE ENDOST 10MM (ENDOMECHANICALS) ×6 IMPLANT
DISSECTOR BLUNT TIP ENDO 5MM (MISCELLANEOUS) IMPLANT
DRAIN PENROSE 18X1/4 LTX STRL (WOUND CARE) ×3 IMPLANT
DRAPE C-ARM 42X72 X-RAY (DRAPES) ×3 IMPLANT
DRAPE CAMERA CLOSED 9X96 (DRAPES) ×3 IMPLANT
DRAPE LAPAROSCOPIC ABDOMINAL (DRAPES) ×3 IMPLANT
ELECT REM PT RETURN 9FT ADLT (ELECTROSURGICAL) ×3
ELECTRODE REM PT RTRN 9FT ADLT (ELECTROSURGICAL) ×2 IMPLANT
GAUZE SPONGE 4X4 16PLY XRAY LF (GAUZE/BANDAGES/DRESSINGS) ×3 IMPLANT
GLOVE BIOGEL PI IND STRL 7.0 (GLOVE) ×12 IMPLANT
GLOVE BIOGEL PI INDICATOR 7.0 (GLOVE) ×6
GLOVE SS BIOGEL STRL SZ 7.5 (GLOVE) ×4 IMPLANT
GLOVE SUPERSENSE BIOGEL SZ 7.5 (GLOVE) ×2
GOWN STRL NON-REIN LRG LVL3 (GOWN DISPOSABLE) ×9 IMPLANT
GOWN STRL REIN XL XLG (GOWN DISPOSABLE) ×9 IMPLANT
HEMOSTAT SURGICEL 4X8 (HEMOSTASIS) IMPLANT
HOVERMATT SINGLE USE (MISCELLANEOUS) ×3 IMPLANT
IV CATH 14GX2 1/4 (CATHETERS) ×3 IMPLANT
IV SET EXT 30 76VOL 4 MALE LL (IV SETS) IMPLANT
KIT BASIN OR (CUSTOM PROCEDURE TRAY) ×3 IMPLANT
KIT GASTRIC LAVAGE 34FR ADT (SET/KITS/TRAYS/PACK) ×3 IMPLANT
NEEDLE SPNL 22GX3.5 QUINCKE BK (NEEDLE) ×3 IMPLANT
NS IRRIG 1000ML POUR BTL (IV SOLUTION) ×3 IMPLANT
PACK CARDIOVASCULAR III (CUSTOM PROCEDURE TRAY) ×3 IMPLANT
PEN SKIN MARKING BROAD (MISCELLANEOUS) ×3 IMPLANT
POUCH SPECIMEN RETRIEVAL 10MM (ENDOMECHANICALS) ×3 IMPLANT
RELOAD 45 VASCULAR/THIN (ENDOMECHANICALS) ×6 IMPLANT
RELOAD BLUE (STAPLE) ×6 IMPLANT
RELOAD ENDO STITCH 2.0 (ENDOMECHANICALS) ×12
RELOAD GOLD (STAPLE) ×3 IMPLANT
RELOAD STAPLE TA45 3.5 REG BLU (ENDOMECHANICALS) ×3 IMPLANT
RELOAD WHITE ECR60W (STAPLE) ×3 IMPLANT
SCALPEL HARMONIC ACE (MISCELLANEOUS) ×3 IMPLANT
SCISSORS LAP 5X35 DISP (ENDOMECHANICALS) ×6 IMPLANT
SEALANT SURGICAL APPL DUAL CAN (MISCELLANEOUS) ×3 IMPLANT
SET CHOLANGIOGRAPH MIX (MISCELLANEOUS) ×3 IMPLANT
SET IRRIG TUBING LAPAROSCOPIC (IRRIGATION / IRRIGATOR) ×3 IMPLANT
SLEEVE ADV FIXATION 12X100MM (TROCAR) ×6 IMPLANT
SLEEVE ADV FIXATION 5X100MM (TROCAR) ×3 IMPLANT
SLEEVE Z-THREAD 5X100MM (TROCAR) ×3 IMPLANT
SOLUTION ANTI FOG 6CC (MISCELLANEOUS) ×3 IMPLANT
SPONGE GAUZE 4X4 12PLY (GAUZE/BANDAGES/DRESSINGS) ×3 IMPLANT
STAPLER STANDARD HANDLE (STAPLE) ×3 IMPLANT
STAPLER VISISTAT 35W (STAPLE) ×3 IMPLANT
STOPCOCK K 69 2C6206 (IV SETS) IMPLANT
STRIP CLOSURE SKIN 1/2X4 (GAUZE/BANDAGES/DRESSINGS) IMPLANT
SUT ETHILON 3 0 PS 1 (SUTURE) IMPLANT
SUT MNCRL AB 4-0 PS2 18 (SUTURE) ×6 IMPLANT
SUT RELOAD ENDO STITCH 2 48X1 (ENDOMECHANICALS) ×14
SUT RELOAD ENDO STITCH 2.0 (ENDOMECHANICALS) ×10
SUT VIC AB 2-0 SH 27 (SUTURE) ×1
SUT VIC AB 2-0 SH 27X BRD (SUTURE) ×2 IMPLANT
SUTURE RELOAD END STTCH 2 48X1 (ENDOMECHANICALS) ×14 IMPLANT
SUTURE RELOAD ENDO STITCH 2.0 (ENDOMECHANICALS) ×10 IMPLANT
SYR 20CC LL (SYRINGE) ×3 IMPLANT
SYR 50ML LL SCALE MARK (SYRINGE) ×3 IMPLANT
SYR CONTROL 10ML LL (SYRINGE) ×3 IMPLANT
TOWEL OR 17X26 10 PK STRL BLUE (TOWEL DISPOSABLE) ×3 IMPLANT
TRAY FOLEY CATH 14FRSI W/METER (CATHETERS) ×3 IMPLANT
TRAY LAP CHOLE (CUSTOM PROCEDURE TRAY) ×3 IMPLANT
TROCAR ADV FIXATION 12X100MM (TROCAR) ×3 IMPLANT
TROCAR HASSON GELL 12X100 (TROCAR) ×3 IMPLANT
TROCAR XCEL 12X100 BLDLESS (ENDOMECHANICALS) ×3 IMPLANT
TROCAR Z-THREAD FIOS 11X100 BL (TROCAR) ×3 IMPLANT
TROCAR Z-THREAD FIOS 5X100MM (TROCAR) ×3 IMPLANT
TUBING ENDO SMARTCAP (MISCELLANEOUS) ×3 IMPLANT
TUBING FILTER THERMOFLATOR (ELECTROSURGICAL) ×3 IMPLANT
TUBING INSUFFLATION 10FT LAP (TUBING) ×3 IMPLANT
WATER STERILE IRR 1500ML POUR (IV SOLUTION) ×3 IMPLANT

## 2011-10-01 NOTE — H&P (View-Only) (Signed)
Bariatric Class:  Appt start time: 1730 end time:  1830.  Pre-Operative Nutrition Class  Patient was seen on 09/20/2011 for Pre-Operative Bariatric Surgery Education at [facility].   Surgery date: 10/01/11 Surgery type: RYGB Start weight at [facility]: 256.5 lbs  Weight today: 248.7 lbs  Samples given per MNT protocol: Bariatric Advantage Multivitamin  Lot # [id number]  Exp: 12/13   Opurity Calcium Citrate  Lot # [id number]  Exp: 11/14   Opurity Bypass/Sleeve MVI  Lot # [id number] Exp: 11/14   Celebrate Vitamins Multivitamin (2)  Lot # [id number]; [id number]  Exp: 09/14; 01/15   Celebrate Vitamins Multivitamin-Complete  Lot # [id number]  Exp: 06/14   Celebrate Vitamins Iron + C (18mg)  Lot # [id number]  Exp: 03/15   Celebrate Vitamins Sublingual B12  Lot # [id number]  Exp: 05/15   Unjury Protein Powder  Lot # [id number]  Exp: 09/14   The following the learning objective met by the patient during this course:  Identifies Pre-Op Dietary Goals and will begin 2 weeks pre-operatively  Identifies appropriate sources of fluids and proteins   States protein recommendations and appropriate sources pre and post-operatively  Identifies Post-Operative Dietary Goals and will follow for 2 weeks post-operatively  Identifies appropriate multivitamin and calcium sources  Describes the need for physical activity post-operatively and will follow MD recommendations  States when to call healthcare provider regarding medication questions or post-operative complications  Handouts given during class include:  Pre-Op Bariatric Surgery Diet Handout  Protein Shake Handout  Post-Op Bariatric Surgery Nutrition Handout  BELT Program Information Flyer  Support Group Information Flyer  Follow-Up Plan: Patient will follow-up at [facility] 2 weeks post operatively for diet advancement per MD.   

## 2011-10-01 NOTE — Transfer of Care (Signed)
Immediate Anesthesia Transfer of Care Note  Patient: Wendy Valencia  Procedure(s) Performed: Procedure(s) (LRB) with comments: LAPAROSCOPIC ROUX-EN-Y GASTRIC (N/A) LAPAROSCOPIC CHOLECYSTECTOMY WITH INTRAOPERATIVE CHOLANGIOGRAM (N/A) UPPER GI ENDOSCOPY ()  Patient Location: PACU  Anesthesia Type: General  Level of Consciousness: awake, pateint uncooperative, lethargic and responds to stimulation  Airway & Oxygen Therapy: Patient Spontanous Breathing and Patient connected to face mask oxygen  Post-op Assessment: Report given to PACU RN, Post -op Vital signs reviewed and stable and Patient moving all extremities  Post vital signs: Reviewed and stable  Complications: No apparent anesthesia complications

## 2011-10-01 NOTE — Anesthesia Postprocedure Evaluation (Signed)
  Anesthesia Post-op Note  Patient: Wendy Valencia  Procedure(s) Performed: Procedure(s) (LRB): LAPAROSCOPIC ROUX-EN-Y GASTRIC (N/A) LAPAROSCOPIC CHOLECYSTECTOMY WITH INTRAOPERATIVE CHOLANGIOGRAM (N/A) UPPER GI ENDOSCOPY ()  Patient Location: PACU  Anesthesia Type: General  Level of Consciousness: awake and alert   Airway and Oxygen Therapy: Patient Spontanous Breathing  Post-op Pain: mild  Post-op Assessment: Post-op Vital signs reviewed, Patient's Cardiovascular Status Stable, Respiratory Function Stable, Patent Airway and No signs of Nausea or vomiting  Post-op Vital Signs: stable  Complications: No apparent anesthesia complications

## 2011-10-01 NOTE — Anesthesia Procedure Notes (Signed)
Procedure Name: Intubation Date/Time: 10/01/2011 7:25 AM Performed by: Edison Pace Pre-anesthesia Checklist: Patient identified, Timeout performed, Emergency Drugs available, Suction available and Patient being monitored Patient Re-evaluated:Patient Re-evaluated prior to inductionOxygen Delivery Method: Circle system utilized Preoxygenation: Pre-oxygenation with 100% oxygen Intubation Type: IV induction and Cricoid Pressure applied Ventilation: Mask ventilation without difficulty Laryngoscope Size: Mac and 3 Grade View: Grade II Tube type: Oral Tube size: 7.5 mm Number of attempts: 1 Airway Equipment and Method: Stylet Placement Confirmation: ETT inserted through vocal cords under direct vision,  positive ETCO2 and breath sounds checked- equal and bilateral Secured at: 21 cm Tube secured with: Tape Dental Injury: Teeth and Oropharynx as per pre-operative assessment

## 2011-10-01 NOTE — Preoperative (Signed)
Beta Blockers   Reason not to administer Beta Blockers:Not Applicable 

## 2011-10-01 NOTE — Interval H&P Note (Signed)
History and Physical Interval Note:  10/01/2011 7:14 AM  Wendy Valencia  has presented today for surgery, with the diagnosis of morbid obesity  gallstones   The various methods of treatment have been discussed with the patient and family. After consideration of risks, benefits and other options for treatment, the patient has consented to  Procedure(s) (LRB) with comments: LAPAROSCOPIC ROUX-EN-Y GASTRIC (N/A) LAPAROSCOPIC CHOLECYSTECTOMY WITH INTRAOPERATIVE CHOLANGIOGRAM (N/A) as a surgical intervention .  The patient's history has been reviewed, patient examined, no change in status, stable for surgery.  I have reviewed the patient's chart and labs.  Questions were answered to the patient's satisfaction.     Sueo Cullen T

## 2011-10-01 NOTE — Op Note (Signed)
Preop diagnosis: Morbid obesity  Postop diagnosis: Morbid obesity  Body mass index is 37.27 kg/(m^2).  Surgical procedure: Laparoscopic Roux-en-Y gastric bypass  Surgeon: Sharlet Salina T.Sandip Power M.D.  Asst.: Ovidio Kin  M.D.  Anesthesia: General  Complications:  None  EBL: Minimal  Drains: None  Disposition: PACU in good condition  Description of procedure: Patient is brought to the operating room and general anesthesia induced. She had received preoperative broad-spectrum IV antibiotics and subcutaneous heparin. The abdomen was widely sterilely prepped and draped. Patient timeout was performed and correct patient and procedure confirmed. Access was obtained with a 12 mm Optiview trocar in the left upper quadrant and pneumoperitoneum established without difficulty. Under direct vision 12 mm trocars were placed laterally in the right upper quadrant, right upper quadrant midclavicular line, and to the left and above the umbilicus for the camera port. A 5 mm trocar was placed laterally in the left upper quadrant. The omentum was brought into the upper abdomen and the transverse mesocolon elevated and the ligament of Treitz clearly identified. A 40 cm biliopancreatic limb was then carefully measured from the ligament of Treitz. The small intestine was divided at this point with a single firing of the white load linear stapler. A Penrose drain was sutured to the end of the Roux-en-Y limb for later identification. A 100 cm Roux-en-Y limb was then carefully measured. At this point a side-to-side anastomosis was created between the Roux limb and the end of the biliopancreatic limb. This was accomplished with a single firing of the 45 mm white load linear stapler. The common enterotomy was closed with a running 2-0 Vicryl begun at either end of the enterotomy and tied centrally. The mesenteric defect was then closed with running 2-0 silk. The omentum was then divided with the harmonic scalpel up towards  the transverse colon to allow mobility of the Roux limb toward the gastric pouch. The patient was then placed in steep reversed Trendelenburg. Through a 5 mm subxiphoid site the [facility] retractor was placed and the left lobe of the liver elevated with excellent exposure of the upper stomach and hiatus. The angle of Hiss was then mobilized with the harmonic scalpel. A 4 cm gastric pouch was then carefully measured along the lesser curve of the stomach. Dissection was carried along the lesser curve at this point with the Harmonic scalpel working carefully back toward the lesser sac at right angles to the lesser curve. The free lesser sac was then entered. After being sure all tubes were removed from the stomach an initial firing of the gold load 60 mm linear stapler was fired at right angles across the lesser curve for about 4 cm. The gastric pouch was further mobilized posteriorly and then the pouch was completed with 2 further firings of the 60 mm blue load linear stapler up through the previously dissected angle of His. It was ensured that the pouch was completely mobilized away from the gastric remnant. This created a nice tubular 4-5 cm gastric pouch. The staple line of the gastric remnant was then oversewn with 2-0 silk for hemostasis. The Roux limb was then brought up in an antecolic fashion with the candycane facing to the patient's left without undue tension. The gastrojejunostomy was created with an initial posterior row of 2-0 Vicryl between the Roux limb and the staple line of the gastric pouch. Enterotomies were then made in the gastric pouch and the Roux limb with the harmonic scalpel and at approximately 2-2-1/2 cm anastomosis was created with a  single firing of the blue load linear stapler. The staple line was inspected and was intact without bleeding. The common enterotomy was then closed with running 2-0 Vicryl begun at either end and tied centrally. The Ewald tube was then easily passed through  the anastomosis and an outer anterior layer of running 2-0 Vicryl was placed. The Ewald tube was removed. With the outlet of the gastrojejunostomy clamped and under saline irrigation the assistant performed upper endoscopy and with the gastric pouch tensely distended with air there was no evidence of leak. The pouch was desufflated. The Vonita Moss defect was closed with running 2-0 silk. The abdomen was inspected for any evidence of bleeding or bowel injury and everything looked fine. The Nathanson retractor was removed under direct vision after coating the anastomosis with Tisseel tissue sealant.  Attention was then turned to the gallbladder. The fundus was grasped and elevated up over the liver and the infundibulum retracted inferiolaterally.  One additional 5mm trocar was used laterally in the RUQ.Peritoneum anterior and posterior to close triangle was incised and fibrofatty tissue stripped off the neck of the gallbladder toward the porta hepatis. The distal gallbladder was thoroughly dissected. The cystic artery was identified in close triangle and the cystic duct gallbladder junction dissected 360.  A good critical view was obtained. When the anatomy was clear the cystic duct was clipped at the gallbladder junction and an operative cholangiogram obtained through the cystic duct. This showed good filling of a normal common bile duct and intrahepatic ducts with free flow into the duodenum and no filling defects. Following this the Cholangiocath was removed and the cystic duct was doubly clipped proximally and divided. The cystic artery was doubly clipped proximally and distally and divided. The gallbladder was dissected free from its bed using hook cautery and removed with an Endocatch bag. Complete hemostasis was obtained in the gallbladder bed. The right upper quadrant was thoroughly irrigated and hemostasis assured. Trochars were removed and all CO2 evacuated.  Skin incisions were closed with subcuticular  Monocryl and Dermabond. Sponge needle and instrument counts were correct. The patient was taken to PACU in good condition.  Wendy Valencia T  10/01/2011       ,

## 2011-10-02 ENCOUNTER — Inpatient Hospital Stay (HOSPITAL_COMMUNITY): Payer: BC Managed Care – PPO

## 2011-10-02 ENCOUNTER — Encounter (HOSPITAL_COMMUNITY): Payer: Self-pay | Admitting: General Surgery

## 2011-10-02 LAB — CBC WITH DIFFERENTIAL/PLATELET
Eosinophils Absolute: 0 10*3/uL (ref 0.0–0.7)
Eosinophils Relative: 0 % (ref 0–5)
HCT: 38.9 % (ref 36.0–46.0)
Hemoglobin: 13.1 g/dL (ref 12.0–15.0)
Lymphocytes Relative: 12 % (ref 12–46)
Lymphs Abs: 1.6 10*3/uL (ref 0.7–4.0)
MCH: 29.1 pg (ref 26.0–34.0)
MCV: 86.4 fL (ref 78.0–100.0)
Monocytes Relative: 11 % (ref 3–12)
RBC: 4.5 MIL/uL (ref 3.87–5.11)

## 2011-10-02 MED ORDER — IOHEXOL 300 MG/ML  SOLN
50.0000 mL | Freq: Once | INTRAMUSCULAR | Status: AC | PRN
  Administered 2011-10-02: 50 mL via ORAL

## 2011-10-02 MED ORDER — DIPHENHYDRAMINE HCL 12.5 MG/5ML PO ELIX
25.0000 mg | ORAL_SOLUTION | Freq: Four times a day (QID) | ORAL | Status: DC | PRN
  Administered 2011-10-02: 25 mg via ORAL
  Filled 2011-10-02: qty 10

## 2011-10-02 MED ORDER — LIP MEDEX EX OINT
TOPICAL_OINTMENT | CUTANEOUS | Status: DC | PRN
  Filled 2011-10-02: qty 7

## 2011-10-02 NOTE — Progress Notes (Signed)
Patient ID: Wendy Valencia, female   DOB: 17-May-1965, 46 y.o.   MRN: [id number] 1 Day Post-Op  Subjective: Moderate pain, better today. Some nausea controlled with meds.  Has been up OOB  Objective: Vital signs in last 24 hours: Temp:  [97.2 F (36.2 C)-98.9 F (37.2 C)] 98.8 F (37.1 C) (09/17 0603) Pulse Rate:  [65-92] 67  (09/17 0603) Resp:  [16-32] 20  (09/17 0603) BP: (140-163)/(79-95) 148/84 mmHg (09/17 0603) SpO2:  [97 %-100 %] 98 % (09/17 0603) Weight:  [247 lb 4.2 oz (112.156 kg)] 247 lb 4.2 oz (112.156 kg) (09/16 1229)    Intake/Output from previous day: 09/16 0701 - 09/17 0700 In: 3987.5 [I.V.:3987.5] Out: 975 [Urine:900] Intake/Output this shift:    General appearance: alert and no distress Resp: clear to auscultation bilaterally GI: abnormal findings:  mild and without guarding tenderness in the epigastrium Incision/Wound: Clean and dry  Lab Results:   Basename 10/02/11 0351 10/01/11 1955  WBC 13.9* --  HGB 13.1 12.6  HCT 38.9 37.2  PLT 345 --   BMET No results found for this basename: NA:2,K:2,CL:2,CO2:2,GLUCOSE:2,BUN:2,CREATININE:2,CALCIUM:2 in the last 72 hours   Studies/Results: Dg Cholangiogram Operative  10/01/2011  *RADIOLOGY REPORT*  Clinical Data: History of gallstones  INTRAOPERATIVE CHOLANGIOGRAM  Comparison:  Abdominal ultrasound - 07/16/2011  Findings:  Intraoperative angiographic images of the right upper abdominal quadrant during laparoscopic cholecystectomy are provided for review.  Surgical clips overlie the expected location of the gallbladder fossa.  Contrast injection demonstrates selective cannulation of the central aspect of the cystic duct.  There is brisk passage of contrast through the central aspect of the cystic duct with filling of a non dilated common bile duct. There is brisk passage of contrast though the CBD and into the descending portion of the duodenum.  There is minimal reflux of injected contrast into the common hepatic  duct and central aspect of the nondilated intrahepatic biliary system.  There are no discrete filling defects within the opacified portions of the biliary system to suggest the presence of choledocholithiasis.  IMPRESSION:  Intraoperative cholangiogram as above.  No discrete filling defects to suggest the presence of choledocholithiasis.   Original Report Authenticated By: Waynard Reeds, M.D.     Anti-infectives: Anti-infectives     Start     Dose/Rate Route Frequency Ordered Stop   10/01/11 0600   cefOXitin (MEFOXIN) 2 g in dextrose 5 % 50 mL IVPB        2 g 100 mL/hr over 30 Minutes Intravenous 60 min pre-op 10/01/11 0523 10/01/11 0700          Assessment/Plan: s/p Procedure(s): LAPAROSCOPIC ROUX-EN-Y GASTRIC LAPAROSCOPIC CHOLECYSTECTOMY WITH INTRAOPERATIVE CHOLANGIOGRAM UPPER GI ENDOSCOPY Stable post op without apparent complication For gastrograffin UGI, then start H2O   LOS: 1 day    Wendy Valencia T 10/02/2011

## 2011-10-02 NOTE — Care Management Note (Signed)
    Page 1 of 1   10/02/2011     1:28:45 PM   CARE MANAGEMENT NOTE 10/02/2011  Patient:  Wendy Valencia, Wendy Valencia   Account Number:  [id number]  Date Initiated:  10/02/2011  Documentation initiated by:  Lorenda Ishihara  Subjective/Objective Assessment:   46 yo female admitted s/p gastric bypass. PTA lived at home alone.     Action/Plan:   Anticipated DC Date:  10/04/2011   Anticipated DC Plan:  HOME/SELF CARE      DC Planning Services  CM consult      Choice offered to / List presented to:             Status of service:  Completed, signed off Medicare Important Message given?   (If response is "NO", the following Medicare IM given date fields will be blank) Date Medicare IM given:   Date Additional Medicare IM given:    Discharge Disposition:  HOME/SELF CARE  Per UR Regulation:  Reviewed for med. necessity/level of care/duration of stay  If discussed at Long Length of Stay Meetings, dates discussed:    Comments:

## 2011-10-02 NOTE — Progress Notes (Signed)
Alert and oriented. Vital Signs stable. Provided copy of gastric bypass instructions for review; will follow-up tomorrow. Instructed on Incentive spirometer. Ambulating well. Pain controlled. Pending doppler and upper GI swallow studies.  

## 2011-10-02 NOTE — Op Note (Signed)
Wendy Valencia, Wendy Valencia            ACCOUNT NO.:  [id number]  MEDICAL RECORD NO.:  [id number]  LOCATION:  1540                         FACILITY:  [facility]  PHYSICIAN:  Sandria Bales. Ezzard Standing, M.D.  DATE OF BIRTH:  1965-05-21  DATE OF PROCEDURE:  10/01/2011                              OPERATIVE REPORT   PREOPERATIVE DIAGNOSES:  Morbid obesity, status post laparoscopic Roux-en-Y gastric bypass.  POSTOPERATIVE DIAGNOSES:  Morbid obesity, status post laparoscopic Roux-en-Y gastric bypass. Schatzki ring at EG junction.  PROCEDURE:  Esophagogastrojejunoscopy.  SURGEON:  Sandria Bales. Ezzard Standing, MD  ANESTHESIA:  General.  INDICATION FOR PROCEDURE:  [name] is a 46 year old female who is a patient of Dr. Lucky Cowboy who has just completed a Roux-en-Y gastric bypass by Dr. Jaclynn Guarneri.  The patient is still under general anesthesia.  Dr. Johna Sheriff is operating the laparoscope and I am doing an upper endoscopy to evaluate the patient's gastric pouch and anastomosis.  OPERATIVE NOTE:  The patient in a supine position under general anesthesia in room #1 at [facility] [address].  I passed the Olympus endoscope down the back of her throat into her gastric pouch without difficulty.  She does have a small Schatzki ring of her distal esophagus.  There was no stenosis.  Her esophagogastric junction was 40 cm.  Her pouch mucosa was viable, there was no active bleeding.  Her anastomosis between the stomach and the jejunum was at about 45 cm for a 5 cm pouch.  I insufflated air into the gastric pouch while Dr. Johna Sheriff clamped off the jejunum and then flooded the abdomen with saline and there was no evidence of any air leak.  The scope was then withdrawn without difficulty.  The esophagus was normal.  The patient tolerated the procedure well.   Sandria Bales. Ezzard Standing, M.D., FACS  DHN/MEDQ  D:  10/01/2011  T:  10/02/2011  Job:  [id number]

## 2011-10-02 NOTE — Progress Notes (Signed)
VASCULAR LAB PRELIMINARY  PRELIMINARY  PRELIMINARY  PRELIMINARY  Bilateral lower extremity venous duplex  completed.    Preliminary report:  Bilateral:  No evidence of DVT, superficial thrombosis, or Baker's Cyst.    Thresa Dozier, RVT 10/02/2011, 9:17 AM

## 2011-10-02 NOTE — Progress Notes (Signed)
Verbal orders for benadryl 25mg  elixir every 6 hours as needed from MD [place], order entered Means, Myrtie Hawk RN 10-02-2011 9:17am

## 2011-10-03 LAB — CBC WITH DIFFERENTIAL/PLATELET
Eosinophils Absolute: 0 10*3/uL (ref 0.0–0.7)
Eosinophils Relative: 0 % (ref 0–5)
HCT: 35.4 % — ABNORMAL LOW (ref 36.0–46.0)
Lymphs Abs: 2.5 10*3/uL (ref 0.7–4.0)
MCH: 29 pg (ref 26.0–34.0)
MCV: 87.8 fL (ref 78.0–100.0)
Monocytes Absolute: 0.7 10*3/uL (ref 0.1–1.0)
Platelets: 304 10*3/uL (ref 150–400)
RDW: 14.7 % (ref 11.5–15.5)

## 2011-10-03 MED ORDER — OXYCODONE-ACETAMINOPHEN 5-325 MG/5ML PO SOLN: 5.0000 mL | ORAL | Status: DC | PRN

## 2011-10-03 NOTE — Progress Notes (Signed)
Patient Alert and oriented. Has reviewed Gastric Bypass instructions. Verbalized understanding. Has follow-up appointments with Dr. Johna Sheriff and Nutrition. Copy of instructions given to patient. GASTRIC BYPASS/SLEEVE DISCHARGE INSTRUCTIONS  Drs. Fredrik Rigger, [place], Margrette Wynia, and [place]  Call if you have any problems.   Call [phone] and ask for the surgeon on call.    If you need immediate assistance come to the ER at [facility]. Tell the ER personnel that you are a new post-op gastric bypass patient. Signs and symptoms to report:   Severe vomiting or nausea. If you cannot tolerate clear liquids for longer than 1 day, you need to call your surgeon.    Abdominal pain which does not get better after taking your pain medication   Fever greater than 101 F degree   Difficulty breathing   Chest pain    Redness, swelling, drainage, or foul odor at incision sites    If your incisions open or pull apart   Swelling or pain in calf (lower leg)   Diarrhea, frequent watery, uncontrolled bowel movements.   Constipation, (no bowel movements for 3 days) if this occurs, Take Milk of Magnesia, 2 tablespoons by mouth, 3 times a day for 2 days if needed.  Call your doctor if constipation continues. Stop taking Milk of Magnesia once you have had a bowel movement. You may also use Miralax according to the label instructions.   Anything you consider "abnormal for you".   Normal side effects after Surgery:   Unable to sleep at night or concentrate   Irritability   Being tearful (crying) or depressed   These are common complaints, possibly related to your anesthesia, stress of surgery and change in lifestyle, that usually go away a few weeks after surgery.  If these feelings continue, call your medical doctor.  Wound Care You may have surgical glue, steri-strips, or staples over your incisions after surgery.  Surgical glue:  Looks like a clear film over your incisions and will wear off  gradually. Steri-strips: Strips of tape over your incisions. You may notice a yellowish color on the skin underneath the steri-strips. This is a substance used to make the steri-strips stick better. Do not pull the steri-strips off - let them fall off.  Staples: Cherlynn Polo may be removed before you leave the hospital. If you go home with staples, call [facility] [phone]) for an appointment with your surgeon's nurse to have staples removed in 7 - 10 days. Showering: You may shower two days after your surgery unless otherwise instructed by your surgeon. Wash gently around wounds with warm soapy water, rinse well, and gently pat dry.  If you have a drain, you may need someone to hold this while you shower. Avoid tub baths until staples are removed and incisions are healed.    Medications   Medications should be liquid or crushed if larger than the size of a dime.  Extended release pills should not be crushed.   Depending on the size and number of medications you take, you may need to stagger/change the time you take your medications so that you do not over-fill your pouch.    Make sure you follow-up with your primary care physician to make medication adjustments needed during rapid weight loss and life-style adjustment.   If you are diabetic, follow up with the doctor that prescribes your diabetes medication(s) within one week after surgery and check your blood sugar regularly.   Do not drive while taking narcotics!   Do not  take acetaminophen (Tylenol) and Roxicet or Lortab Elixir at the same time since these pain medications contain acetaminophen.  Diet at home: (First 2 Weeks) You will see the nutritionist two weeks after your surgery. She will advance your diet if you are tolerating liquids well. Once at home, if you have severe vomiting or nausea and cannot tolerate clear liquids lasting longer than 1 day, call your surgeon.  Begin high protein shake 2 ounces every 3 hours, 5 - 6  times per day.  Gradually increase the amount you drink as tolerated.  You may find it easier to slowly sip shakes throughout the day.  It is important to get your proteins in first.   Protein Shake   Drink at least 2 ounces of shake 5-6 times per day   Each serving of protein shakes should have a minimum of 15 grams of protein and no more than 5 grams of carbohydrate    Increase the amount of protein shake you drink as tolerated   Protein powder may be added to fluids such as non-fat milk or Lactaid milk (limit to 20 grams added protein powder per serving   The initial goal is to drink at least 8 ounces of protein shake/drink per day (or as directed by the nutritionist). Some examples of protein shakes are [employer], [employer], EAS Edge HP, and [employer]jury. Hydration   Gradually increase the amount of water and other liquids as tolerated (See Acceptable Fluids)   Gradually increase the amount of protein shake as tolerated     Sip fluids slowly and throughout the day   May use Sugar substitutes, use sparingly (limit to 6 - 8 packets per day). Your fluid goal is 64 ounces of fluid daily. It may take a few weeks to build up to this.         32 oz (or more) should be clear liquids and 32 oz (or more) should be full liquids.         Liquids should not contain sugar, caffeine, or carbonation! Acceptable Fluids Clear Liquids:   Water or Sugar-free flavored water, Fruit H2O   Decaffeinated coffee or tea (sugar-free)   Crystal Lite, Wyler's Lite, Minute Maid Lite   Sugar-free Jell-O   Bouillon or broth   Sugar-free Popsicle:   *Less than 20 calories each; Limit 1 per day   Full Liquids:              Protein Shakes/Drinks + 2 choices per day of other full liquids shown below.    Other full liquids must be: No more than 12 grams of Carbs per serving,  No more than 3 grams of Fat per serving   Strained low-fat cream soup   Non-Fat milk   Fat-free Lactaid Milk   Sugar-free yogurt (Dannon  Lite & Fit) Vitamins and Minerals (Start 1 day after surgery unless otherwise directed)   2 Chewable Multivitamin / Multimineral Supplement (i.e. Centrum for Adults)   Chewable Calcium Citrate with Vitamin D-3. Take 1500 mg each day.           (Example: 3 Chewable Calcium Plus 600 with Vitamin D-3 can be found at [facility])         Vitamin B-12, 350 - 500 micrograms (oral tablet) each day   Do not mix multivitamins containing iron with calcium supplements; take 2 hours   apart   Do not substitute Tums (calcium carbonate) for your calcium   Menstruating women and those at risk for  anemia may need extra iron. Talk with your doctor to see if you need additional iron.    If you need extra iron:  Total daily Iron recommendations (including Vitamins) = 50 - 100 mg Iron/day Do not stop taking or change any vitamins or minerals until you talk to your nutritionist or surgeon. Your nutritionist and / or physician must approve all vitamin and mineral supplements. Exercise For maximum success, begin exercising as soon as your doctor recommends. Make sure your physician approves any physical activity.   Depending on fitness level, begin with a simple walking program   Walk 5-15 minutes each day, 7 days per week.    Slowly increase until you are walking 30-45 minutes per day   Consider joining our BELT program. [phone] or email [email or web address] [email or web address] Things to remember:    You may have sexual relations when you feel comfortable. It is VERY important for female patients to use a reliable birth control method. Fertility often increases after surgery. Do not get pregnant for at least 18 months.   It is very important to keep all follow up appointments with your surgeon, nutritionist, primary care physician, and behavioral health practitioner. After the first year, please follow up with your bariatric surgeon at least once a year in order to maintain best weight loss results.  [facility]:  [phone] [facility]: [phone]   Free counseling is available for you and your family through collaboration between [facility] and [place]. Please call [phone] and leave a message.    Consider purchasing a medical alert bracelet that says you had gastric bypass surgery.    [facility] has a free Bariatric Surgery Support Group that meets monthly, the 3rd Thursday, 6 pm, Classroom #1, [place]. You may register online at [email or web address], but registration is not necessary. Select Classes and Support Groups, Bariatric Surgery, or Call [phone]   Do not return to work or drive until cleared by your surgeon   Use your CPAP when sleeping if applicable   Do not lift anything greater than ten pounds for at least two weeks

## 2011-10-03 NOTE — Discharge Summary (Signed)
Patient ID: Wendy Valencia [id number] 46 y.o. 06/23/1965  10/01/2011  Discharge date and time: 10/03/2011   Admitting Physician: Glenna Fellows T  Discharge Physician: Glenna Fellows T  Admission Diagnoses: morbid obesity  gallstones   Discharge Diagnoses: Same  Operations: Procedure(s): LAPAROSCOPIC ROUX-EN-Y GASTRIC LAPAROSCOPIC CHOLECYSTECTOMY WITH INTRAOPERATIVE CHOLANGIOGRAM UPPER GI ENDOSCOPY  Admission Condition: good  Discharged Condition: good  Indication for Admission: patient is a 46 year old female with progressive morbid obesity unresponsive to medical management who was electively admitted for laparoscopic Roux-en-Y gastric bypass. Preoperative workup also revealed cholelithiasis and cholecystectomy was planned as well.  Hospital Course: patient was admitted on the morning of her procedure and underwent an uneventful laparoscopic Roux-en-Y gastric bypass and cholecystectomy with normal operative cholangiogram. Her postoperative course was very smooth. She had some moderate pain initially that improved quickly. On the first postoperative day her Gastrografin swallow was negative for leak or obstruction. She was started on water which she tolerated well. By the second postoperative day she had minimal discomfort. Abdomen is benign on exam. Wounds are healing well. CBC is normal. She is started on protein shakes and if she tolerates this well discharge is planned later today.   Disposition: Home  Patient Instructions:   Edelyn, Heidel  Home Medication Instructions WUJ:[id number]   Printed on:10/03/11 9562  Medication Information                    lisinopril-hydrochlorothiazide (PRINZIDE,ZESTORETIC) 20-12.5 MG per tablet Take 1 tablet by mouth daily with breakfast.            ALPRAZolam (XANAX) 1 MG tablet Take 1.5 mg by mouth at bedtime as needed. Anxiety/sleep           metoCLOPramide (REGLAN) 10 MG tablet Take 10 mg by mouth as needed. IBS           traMADol (ULTRAM) 50 MG tablet Take 100 mg by mouth 2 (two) times daily.             medroxyPROGESTERone (DEPO-PROVERA) 150 MG/ML injection Inject 150 mg into the muscle every 3 (three) months.            diphenhydrAMINE (BENADRYL) 25 mg capsule Take 25 mg by mouth every 6 (six) hours as needed. Needs benadryl with imitrex and hydrocodone           augmented betamethasone dipropionate (DIPROLENE-AF) 0.05 % ointment Apply 1 application topically as needed. Rash           Cholecalciferol (VITAMIN D3) 10000 UNITS capsule Take 10,000 Units by mouth daily.           ferrous sulfate 325 (65 FE) MG tablet Take 325 mg by mouth daily with breakfast.           acidophilus (RISAQUAD) CAPS Take 1 capsule by mouth daily.           Ascorbic Acid (VITAMIN C PO) Take 300 mg by mouth daily.            B Complex-C (B-COMPLEX WITH VITAMIN C) tablet Take 1 tablet by mouth daily.           Cyanocobalamin (VITAMIN B-12) 2500 MCG SUBL Place 1 tablet under the tongue 2 (two) times daily.           acetaminophen (TYLENOL) 500 MG tablet Take 1,000 mg by mouth every 6 (six) hours as needed.           oxyCODONE-acetaminophen (ROXICET) 5-325 MG/5ML solution Take 5-10 mLs by mouth  every 4 (four) hours as needed.             Activity: activity as tolerated Diet: protein shakes Wound Care: none needed  Follow-up:  With Dr. Johna Sheriff in 2 weeks.  Signed: Mariella Saa MD, FACS  10/03/2011, 8:12 AM

## 2011-10-03 NOTE — Progress Notes (Signed)
Patient ID: Wendy Valencia, female   DOB: 1965/02/15, 46 y.o.   MRN: [id number] 2 Days Post-Op  Subjective: Feels much better today. Mild pain well controlled with oral medications. Tolerating her water well. No flatus or bowel movement yet. No nausea.  Objective: Vital signs in last 24 hours: Temp:  [98.3 F (36.8 C)-98.7 F (37.1 C)] 98.6 F (37 C) (09/18 0625) Pulse Rate:  [58-63] 62  (09/18 0625) Resp:  [16-18] 18  (09/18 0625) BP: (132-168)/(82-98) 141/82 mmHg (09/18 0625) SpO2:  [95 %-98 %] 96 % (09/18 0625)    Intake/Output from previous day: 09/17 0701 - 09/18 0700 In: 3180 [P.O.:180; I.V.:3000] Out: 2250 [Urine:2250] Intake/Output this shift:    General appearance: alert and no distress Resp: clear to auscultation bilaterally GI: normal findings: soft, non-tender Incision/Wound: clean and dry without evidence of infection  Lab Results:   Basename 10/03/11 0345 10/02/11 0351  WBC 9.1 13.9*  HGB 11.7* 13.1  HCT 35.4* 38.9  PLT 304 345   BMET No results found for this basename: NA:2,K:2,CL:2,CO2:2,GLUCOSE:2,BUN:2,CREATININE:2,CALCIUM:2 in the last 72 hours   Studies/Results: Dg Cholangiogram Operative  10/01/2011  *RADIOLOGY REPORT*  Clinical Data: History of gallstones  INTRAOPERATIVE CHOLANGIOGRAM  Comparison:  Abdominal ultrasound - 07/16/2011  Findings:  Intraoperative angiographic images of the right upper abdominal quadrant during laparoscopic cholecystectomy are provided for review.  Surgical clips overlie the expected location of the gallbladder fossa.  Contrast injection demonstrates selective cannulation of the central aspect of the cystic duct.  There is brisk passage of contrast through the central aspect of the cystic duct with filling of a non dilated common bile duct. There is brisk passage of contrast though the CBD and into the descending portion of the duodenum.  There is minimal reflux of injected contrast into the common hepatic duct and central  aspect of the nondilated intrahepatic biliary system.  There are no discrete filling defects within the opacified portions of the biliary system to suggest the presence of choledocholithiasis.  IMPRESSION:  Intraoperative cholangiogram as above.  No discrete filling defects to suggest the presence of choledocholithiasis.   Original Report Authenticated By: Waynard Reeds, M.D.    Dg Kayleen Memos W/water Sol Cm  10/02/2011  *RADIOLOGY REPORT*  Clinical Data:  Status post gastric bypass surgery.  UPPER GI SERIES  Fluoroscopy time: 1 minute, 6 seconds.  Comparison:  None.  Findings:  Pre-procedure KUB demonstrates a nonobstructive bowel gas pattern with surgical clips in the right upper quadrant.  Bowel anastomotic suture noted in the left hemiabdomen.  Fluoroscopic images demonstrate contrast passage from the distal esophagus into the residual stomach.  There is narrowing of the gastrojejunostomy, not unexpected in the recently postoperative state.  Contrast readily passes the gastrojejunostomy, into jejunal loops in the left hemiabdomen.  Bowel loops are normal caliber.  No extravasation of ingested contrast identified.  Post procedure KUB demonstrates a nonobstructive bowel gas pattern and continued opacification of small bowel loops distal to the jejunal - jejunal anastomoses.  IMPRESSION: Status post gastric bypass.  No obstruction or extravasation of ingested contrast.   Original Report Authenticated By: Waneta Martins, M.D.     Anti-infectives: Anti-infectives     Start     Dose/Rate Route Frequency Ordered Stop   10/01/11 0600   cefOXitin (MEFOXIN) 2 g in dextrose 5 % 50 mL IVPB        2 g 100 mL/hr over 30 Minutes Intravenous 60 min pre-op 10/01/11 0523 10/01/11 0700  Assessment/Plan: s/p Procedure(s): LAPAROSCOPIC ROUX-EN-Y GASTRIC LAPAROSCOPIC CHOLECYSTECTOMY WITH INTRAOPERATIVE CHOLANGIOGRAM UPPER GI ENDOSCOPY Doing very well following laparoscopic gastric bypass and  cholecystectomy. Advanced postoperative day 2 diet today. If tolerated well plan discharge later today.   LOS: 2 days    Hriday Stai T 10/03/2011

## 2011-10-03 NOTE — Progress Notes (Signed)
Discharge summary sent to payer through [facility]  

## 2011-10-06 ENCOUNTER — Telehealth (INDEPENDENT_AMBULATORY_CARE_PROVIDER_SITE_OTHER): Payer: Self-pay | Admitting: General Surgery

## 2011-10-06 NOTE — Telephone Encounter (Signed)
She called because she removed a tegaderm from the epigastric trocar site and the incision opened slightly.  I recommended that she just dress this with dry gauze or band aid as needed.  This should heal up okay with wound care.

## 2011-10-10 ENCOUNTER — Encounter (INDEPENDENT_AMBULATORY_CARE_PROVIDER_SITE_OTHER): Payer: Self-pay | Admitting: General Surgery

## 2011-10-10 ENCOUNTER — Ambulatory Visit (INDEPENDENT_AMBULATORY_CARE_PROVIDER_SITE_OTHER): Payer: BC Managed Care – PPO | Admitting: General Surgery

## 2011-10-10 DIAGNOSIS — K912 Postsurgical malabsorption, not elsewhere classified: Secondary | ICD-10-CM

## 2011-10-10 NOTE — Progress Notes (Signed)
History: Patient returns for her first postop visit approaching 3 weeks following laparoscopic Roux-en-Y gastric bypass and cholecystectomy. She generally has been getting along quite well. The only concern is that with thin liquids such as water she will get some gurgling sensation and slight discomfort in her epigastrium that is limiting somewhat her fluid intake. She is concerned she could be getting dehydrated. She is actually able to take her full liquids and yogurt without difficulty. She has no vomiting. Bowels are moving okay. No fever or chills or significant abdominal pain. She wants to go back to work next week.  Exam: BP 130/80  Pulse 76  Temp 98.7 F (37.1 C) (Temporal)  Resp 20  Ht 5\' 8"  (1.727 m)  Wt 233 lb 6 oz (105.858 kg)  BMI 35.48 kg/m2 Total weight loss 14 pounds General: Appears well Abdomen: Soft and nontender. Incisions healing well.  Assessment and plan: I think she is doing well postoperatively without apparent complication. I'm not sure what thin liquids were low more difficult that she is not having vomiting and appears well. We will check electrolytes to make sure there is no evidence of dehydration. She will continue to push fluids. She will call she feels any worsen otherwise return in 3 weeks.

## 2011-10-10 NOTE — Addendum Note (Signed)
Addended by: Maryan Puls on: 10/10/2011 02:50 PM   Modules accepted: Orders

## 2011-10-12 LAB — BASIC METABOLIC PANEL
CO2: 21 mEq/L (ref 19–32)
Calcium: 9.5 mg/dL (ref 8.4–10.5)
Creat: 0.7 mg/dL (ref 0.50–1.10)
Glucose, Bld: 88 mg/dL (ref 70–99)
Sodium: 140 mEq/L (ref 135–145)

## 2011-10-16 ENCOUNTER — Encounter: Payer: BC Managed Care – PPO | Attending: General Surgery | Admitting: *Deleted

## 2011-10-16 ENCOUNTER — Encounter: Payer: Self-pay | Admitting: *Deleted

## 2011-10-16 DIAGNOSIS — Z01818 Encounter for other preprocedural examination: Secondary | ICD-10-CM | POA: Insufficient documentation

## 2011-10-16 DIAGNOSIS — Z713 Dietary counseling and surveillance: Secondary | ICD-10-CM | POA: Insufficient documentation

## 2011-10-16 NOTE — Patient Instructions (Signed)
Patient to follow Phase 3A-Soft, High Protein Diet and follow-up at [facility] in 6 weeks for 2 months post-op nutrition visit for diet advancement. 

## 2011-10-16 NOTE — Progress Notes (Signed)
  Bariatric Class:  Appt start time: 1600 end time:  1700.  2 Week Post-Operative Nutrition Class  Patient was seen on 10/16/2011 for Post-[facility]ative [facility]   Surgery date: 10/01/11 Surgery type: RYGB [facility] weight at [facility]: 256.5 lbs  Last weight: 248.7 lbs Weight today: 231.5 lbs Weight change: 17.2 lbs Total weight lost: 25.0 lbs BMI: 35.2 kg/m^2  TANITA  BODY COMP RESULTS  07/18/11 10/16/11   %Fat 50.8% 48.3%   Fat Mass (lbs) 130.5 112.0   Fat Free Mass (lbs) 126.0 119.5   Total Body Water (lbs) 92.0 87.5   The following the learning objective met the patient during this course:   Identifies Phase 3A (Soft, High Proteins) Dietary Goals and will begin from 2 weeks post-operatively to 2 months post-operatively  Identifies appropriate sources of fluids and proteins   States protein recommendations and appropriate sources post-operatively  Identifies the need for appropriate texture modifications, mastication, and bite sizes when consuming solids  Identifies appropriate multivitamin and calcium sources post-operatively  Describes the need for physical activity post-operatively and will follow MD recommendations  States when to call healthcare provider regarding medication questions or post-operative complications  Handouts given during class include:  Phase 3A: Soft, High Protein Diet Handout  Follow-Up Plan: Patient will follow-up at [facility] in 6 weeks for 2 months post-op nutrition visit for diet advancement per MD.

## 2011-11-23 ENCOUNTER — Ambulatory Visit (INDEPENDENT_AMBULATORY_CARE_PROVIDER_SITE_OTHER): Payer: BC Managed Care – PPO | Admitting: General Surgery

## 2011-11-23 NOTE — Patient Instructions (Signed)
Take a fiber supplement daily such as Benefiber

## 2011-11-23 NOTE — Progress Notes (Signed)
History: Patient returns for routine followup now approaching 2 months following laparoscopic Roux-en-Y gastric bypass for morbid obesity.she was having some difficulty with liquids initially but this has improved and she is now on her solid diet with minimal difficulty only with certain foods. She notices a little constipation. Her energy level is good. No significant abdominal pain.  Exam: There were no vitals taken for this visit. The total weight loss 42 pounds since surgery, current weight 215 General: appears well Abdomen: Soft and nontender. Wounds well healed. No hernias.  Assessment and plan: Doing very well following gastric bypass without complication identified. Reviewed the excised edges. 2 return in 6 weeks.

## 2011-11-27 ENCOUNTER — Ambulatory Visit: Payer: BC Managed Care – PPO | Admitting: *Deleted

## 2011-11-29 ENCOUNTER — Encounter: Payer: BC Managed Care – PPO | Attending: General Surgery | Admitting: *Deleted

## 2011-11-29 ENCOUNTER — Encounter: Payer: Self-pay | Admitting: *Deleted

## 2011-11-29 DIAGNOSIS — Z01818 Encounter for other preprocedural examination: Secondary | ICD-10-CM | POA: Insufficient documentation

## 2011-11-29 DIAGNOSIS — Z713 Dietary counseling and surveillance: Secondary | ICD-10-CM | POA: Insufficient documentation

## 2011-11-29 NOTE — Patient Instructions (Addendum)
Goals:  Follow Phase 3B: High Protein + Non-Starchy Vegetables  Eat 3-6 small meals/snacks, every 3-5 hrs  Increase lean protein foods to meet 60-80g goal  Increase fluid intake to 64oz +  Avoid drinking 15 minutes before, during and 30 minutes after eating  Aim for >30 min of physical activity daily 

## 2011-11-29 NOTE — Progress Notes (Signed)
  Follow-up visit:  8 Weeks Post-Operative RYGB Surgery  Medical Nutrition Therapy:  Appt start time: 1130 end time:  1200.  Primary concerns today: Post-operative Bariatric Surgery Nutrition Management. Doing well.  Fluid intake still decreased.  Has increased exercise.   Surgery date: 10/01/11 Surgery type: RYGB Start weight at [facility]: 256.5 lbs  Weight today: 212.0 lbs Weight change: 19.5 lbs Total weight lost: 44.5 lbs BMI: 32.2 kg/m^2  Goal weight: 175 lbs % goal met: 55%  TANITA  BODY COMP RESULTS  07/18/11 10/16/11 11/29/11   %Fat 50.8% 48.3% 43.0%   Fat Mass (lbs) 130.5 112.0 91.0   Fat Free Mass (lbs) 126.0 119.5 121.0   Total Body Water (lbs) 92.0 87.5 88.5   24-hr recall: B (8 AM): [place] yogurt (12g P) Snk (10 AM): 1 tbsp reduced fat PB  L (11:30-12 PM): 3 oz chicken breast or boiled shrimp with zucchini (~25g P) Snk (PM): Pure protein bar (15g P) D (PM): similar to lunch (~20g P) Snk (PM): not reported  Fluid intake: ~50 oz Estimated total protein intake: 70-75 g  Medications: See med list for reported changes.  Supplementation: Taking MVI and calcium regularly; B12 injections bi-weekly  Using straws: No Drinking while eating: No Hair loss: No Carbonated beverages: No N/V/D/C: None Dumping syndrome: None reported  Recent physical activity:  ~ 40 min @ 3x/week (20 min cardio/20 min strength training)  Progress Towards Goal(s):  In progress.   Nutritional Diagnosis:  [place]-3.3 Overweight/obesity As related to recent RYGB surgery.  As evidenced by patient attempting to follow post-op nutritional guidelines for continued weight loss.    Intervention:  Nutrition education/diet advancement.  Monitoring/Evaluation:  Dietary intake, exercise, and body weight. Follow up in 1 months for 3 month post-op visit.

## 2012-01-03 ENCOUNTER — Other Ambulatory Visit: Payer: Self-pay | Admitting: Internal Medicine

## 2012-01-03 DIAGNOSIS — Z1231 Encounter for screening mammogram for malignant neoplasm of breast: Secondary | ICD-10-CM

## 2012-01-07 ENCOUNTER — Ambulatory Visit: Payer: BC Managed Care – PPO

## 2012-01-08 ENCOUNTER — Ambulatory Visit
Admission: RE | Admit: 2012-01-08 | Discharge: 2012-01-08 | Disposition: A | Discharge status: Home / Self Care | Payer: BC Managed Care – PPO | Source: Ambulatory Visit | Attending: Internal Medicine | Admitting: Internal Medicine

## 2012-01-08 DIAGNOSIS — Z1231 Encounter for screening mammogram for malignant neoplasm of breast: Secondary | ICD-10-CM

## 2012-01-08 LAB — HM MAMMOGRAPHY: HM Mammogram: NORMAL

## 2012-01-10 ENCOUNTER — Ambulatory Visit: Payer: BC Managed Care – PPO | Admitting: *Deleted

## 2012-01-14 ENCOUNTER — Other Ambulatory Visit (HOSPITAL_COMMUNITY)
Admission: RE | Admit: 2012-01-14 | Discharge: 2012-01-14 | Disposition: A | Discharge status: Home / Self Care | Payer: BC Managed Care – PPO | Source: Ambulatory Visit | Attending: Internal Medicine | Admitting: Internal Medicine

## 2012-01-14 ENCOUNTER — Other Ambulatory Visit: Payer: Self-pay

## 2012-01-14 DIAGNOSIS — Z01419 Encounter for gynecological examination (general) (routine) without abnormal findings: Secondary | ICD-10-CM | POA: Insufficient documentation

## 2012-01-14 DIAGNOSIS — Z1151 Encounter for screening for human papillomavirus (HPV): Secondary | ICD-10-CM | POA: Insufficient documentation

## 2012-01-14 DIAGNOSIS — N760 Acute vaginitis: Secondary | ICD-10-CM | POA: Insufficient documentation

## 2012-01-14 LAB — HM PAP SMEAR: HM Pap smear: NORMAL

## 2012-01-17 ENCOUNTER — Ambulatory Visit (INDEPENDENT_AMBULATORY_CARE_PROVIDER_SITE_OTHER): Payer: BC Managed Care – PPO | Admitting: General Surgery

## 2012-01-24 ENCOUNTER — Encounter: Payer: BC Managed Care – PPO | Attending: General Surgery | Admitting: *Deleted

## 2012-01-24 ENCOUNTER — Encounter: Payer: Self-pay | Admitting: *Deleted

## 2012-01-24 DIAGNOSIS — Z01818 Encounter for other preprocedural examination: Secondary | ICD-10-CM | POA: Insufficient documentation

## 2012-01-24 DIAGNOSIS — Z713 Dietary counseling and surveillance: Secondary | ICD-10-CM | POA: Insufficient documentation

## 2012-01-24 NOTE — Patient Instructions (Addendum)
Goals:  Follow Phase 3B: High Protein + Non-Starchy Vegetables  Increase lean protein foods to meet 60-80g goal  Increase fluid intake to 64oz +  Add 15 grams of carbohydrate (fruit, whole grain, starchy vegetable) with meals  Try Special K High Protein  Have protein with all carbs  Avoid drinking 15 minutes before, during and 30 minutes after eating  Aim for >30 min of physical activity daily

## 2012-01-24 NOTE — Progress Notes (Signed)
  Follow-up visit:  3 Month Post-Operative RYGB Surgery  Medical Nutrition Therapy:  Appt start time: 1130  End time:  1215.  Primary concerns today: Post-operative Bariatric Surgery Nutrition Management. Doing well, though reports her A1c has increased. May be d/t continued decrease in fluid intake and stress. Has increased exercise. Not taking B12 at this time d/t high levels reported by PCP.   Surgery date: 10/01/11 Surgery type: RYGB Start weight at [facility]: 256.5 lbs  Weight today: 190.5 lbs Weight change: 21.5 lbs Total weight lost: 66.0 lbs BMI: 29.0 kg/m^2  Goal weight: 175 lbs % goal met: 81%  TANITA  BODY COMP RESULTS  07/18/11 10/16/11 11/29/11 01/24/12   %Fat 50.8% 48.3% 43.0% 39.8%   Fat Mass (lbs) 130.5 112.0 91.0 76.0   Fat Free Mass (lbs) 126.0 119.5 121.0 114.5   Total Body Water (lbs) 92.0 87.5 88.5 84.0   24-hr recall: B (8 AM): Oatmeal (plain packet) w/ splenda Snk (10 AM): Pistachios (1/4 cup over 2-3 hours) L (11:30-12 PM): 3 oz chicken breast or boiled shrimp with zucchini (~25g P) Snk (PM): Pure protein bar (15g P) D (PM): similar to lunch (~20g P) Snk (PM): not reported  Fluid intake: ~50 oz Estimated total protein intake: 70-75 g  Last patient-reported A1c: 5.9% (@ MD; up from 4.7%) CBG: Not checking at this time  Medications: See med list for reported changes.  Supplementation:  Taking MVI and calcium inconsistently; no longer getting B12 injections d/t elevated lab per PCP  Using straws: No Drinking while eating: No Hair loss: No Carbonated beverages:  (1) 12 oz can Diet Mtn. Dew daily over ice in 32 oz cup - sips on all day N/V/D/C: None Dumping syndrome: None reported  Recent physical activity:  ~ 40 min @ 3x/week (20 min cardio/20 min strength training) - walks 1 mile in a.m. and in p.m.  Progress Towards Goal(s):  In progress.   Nutritional Diagnosis:  [place]-3.3 Overweight/obesity related to recent RYGB surgery as evidenced by patient  attempting to follow post-op nutritional guidelines for continued weight loss.  Inadequate fluid intake related to recent RYGB surgery as evidenced by patient reported fluid intake of 60-65% of post-op guidelines.    Intervention:  Nutrition education/reinforcement.  Monitoring/Evaluation:  Dietary intake, exercise, and body weight. Follow up in 3 months for 6-7 month post-op visit.

## 2012-02-29 ENCOUNTER — Ambulatory Visit (INDEPENDENT_AMBULATORY_CARE_PROVIDER_SITE_OTHER): Payer: BC Managed Care – PPO | Admitting: General Surgery

## 2012-04-11 ENCOUNTER — Ambulatory Visit (INDEPENDENT_AMBULATORY_CARE_PROVIDER_SITE_OTHER): Payer: BC Managed Care – PPO | Admitting: General Surgery

## 2012-04-23 ENCOUNTER — Ambulatory Visit: Payer: BC Managed Care – PPO | Admitting: *Deleted

## 2012-06-03 ENCOUNTER — Other Ambulatory Visit: Payer: Self-pay | Admitting: Internal Medicine

## 2012-06-03 DIAGNOSIS — R748 Abnormal levels of other serum enzymes: Secondary | ICD-10-CM

## 2012-06-11 ENCOUNTER — Other Ambulatory Visit: Payer: BC Managed Care – PPO

## 2012-06-24 ENCOUNTER — Other Ambulatory Visit: Payer: BC Managed Care – PPO

## 2012-06-24 ENCOUNTER — Ambulatory Visit: Payer: BC Managed Care – PPO | Admitting: *Deleted

## 2012-09-11 ENCOUNTER — Ambulatory Visit: Payer: BC Managed Care – PPO | Admitting: *Deleted

## 2012-09-12 ENCOUNTER — Emergency Department (HOSPITAL_COMMUNITY)
Admission: EM | Admit: 2012-09-12 | Discharge: 2012-09-12 | Disposition: A | Discharge status: Home / Self Care | Payer: BC Managed Care – PPO | Attending: Emergency Medicine | Admitting: Emergency Medicine

## 2012-09-12 ENCOUNTER — Emergency Department (HOSPITAL_COMMUNITY): Payer: BC Managed Care – PPO

## 2012-09-12 ENCOUNTER — Encounter (HOSPITAL_COMMUNITY): Payer: Self-pay | Admitting: Emergency Medicine

## 2012-09-12 DIAGNOSIS — Z87891 Personal history of nicotine dependence: Secondary | ICD-10-CM | POA: Insufficient documentation

## 2012-09-12 DIAGNOSIS — R55 Syncope and collapse: Secondary | ICD-10-CM | POA: Insufficient documentation

## 2012-09-12 DIAGNOSIS — Z8719 Personal history of other diseases of the digestive system: Secondary | ICD-10-CM | POA: Insufficient documentation

## 2012-09-12 DIAGNOSIS — Z9889 Other specified postprocedural states: Secondary | ICD-10-CM | POA: Insufficient documentation

## 2012-09-12 DIAGNOSIS — E119 Type 2 diabetes mellitus without complications: Secondary | ICD-10-CM | POA: Insufficient documentation

## 2012-09-12 DIAGNOSIS — IMO0001 Reserved for inherently not codable concepts without codable children: Secondary | ICD-10-CM | POA: Insufficient documentation

## 2012-09-12 DIAGNOSIS — Z79899 Other long term (current) drug therapy: Secondary | ICD-10-CM | POA: Insufficient documentation

## 2012-09-12 DIAGNOSIS — Z8739 Personal history of other diseases of the musculoskeletal system and connective tissue: Secondary | ICD-10-CM | POA: Insufficient documentation

## 2012-09-12 DIAGNOSIS — S0990XA Unspecified injury of head, initial encounter: Secondary | ICD-10-CM | POA: Insufficient documentation

## 2012-09-12 DIAGNOSIS — Y9389 Activity, other specified: Secondary | ICD-10-CM | POA: Insufficient documentation

## 2012-09-12 DIAGNOSIS — F411 Generalized anxiety disorder: Secondary | ICD-10-CM | POA: Insufficient documentation

## 2012-09-12 DIAGNOSIS — I10 Essential (primary) hypertension: Secondary | ICD-10-CM | POA: Insufficient documentation

## 2012-09-12 DIAGNOSIS — Y929 Unspecified place or not applicable: Secondary | ICD-10-CM | POA: Insufficient documentation

## 2012-09-12 DIAGNOSIS — D649 Anemia, unspecified: Secondary | ICD-10-CM | POA: Insufficient documentation

## 2012-09-12 DIAGNOSIS — R519 Headache, unspecified: Secondary | ICD-10-CM

## 2012-09-12 DIAGNOSIS — R0602 Shortness of breath: Secondary | ICD-10-CM | POA: Insufficient documentation

## 2012-09-12 DIAGNOSIS — W1809XA Striking against other object with subsequent fall, initial encounter: Secondary | ICD-10-CM | POA: Insufficient documentation

## 2012-09-12 DIAGNOSIS — R209 Unspecified disturbances of skin sensation: Secondary | ICD-10-CM | POA: Insufficient documentation

## 2012-09-12 DIAGNOSIS — Z87442 Personal history of urinary calculi: Secondary | ICD-10-CM | POA: Insufficient documentation

## 2012-09-12 LAB — CBC WITH DIFFERENTIAL/PLATELET
Basophils Absolute: 0 10*3/uL (ref 0.0–0.1)
Basophils Relative: 0 % (ref 0–1)
HCT: 41.1 % (ref 36.0–46.0)
Lymphocytes Relative: 34 % (ref 12–46)
MCHC: 34.3 g/dL (ref 30.0–36.0)
Monocytes Absolute: 0.5 10*3/uL (ref 0.1–1.0)
Neutro Abs: 3.7 10*3/uL (ref 1.7–7.7)
Neutrophils Relative %: 59 % (ref 43–77)
Platelets: 296 10*3/uL (ref 150–400)
RDW: 13.5 % (ref 11.5–15.5)
WBC: 6.4 10*3/uL (ref 4.0–10.5)

## 2012-09-12 LAB — COMPREHENSIVE METABOLIC PANEL
ALT: 12 U/L (ref 0–35)
AST: 16 U/L (ref 0–37)
Albumin: 4.2 g/dL (ref 3.5–5.2)
Alkaline Phosphatase: 125 U/L — ABNORMAL HIGH (ref 39–117)
BUN: 9 mg/dL (ref 6–23)
Chloride: 106 mEq/L (ref 96–112)
Potassium: 3.4 mEq/L — ABNORMAL LOW (ref 3.5–5.1)
Sodium: 141 mEq/L (ref 135–145)
Total Bilirubin: 0.6 mg/dL (ref 0.3–1.2)
Total Protein: 7.2 g/dL (ref 6.0–8.3)

## 2012-09-12 LAB — D-DIMER, QUANTITATIVE: D-Dimer, Quant: 0.36 ug/mL-FEU (ref 0.00–0.48)

## 2012-09-12 LAB — TROPONIN I: Troponin I: <0.3 ng/mL (ref <0.30)

## 2012-09-12 MED ORDER — HYDROMORPHONE HCL PF 1 MG/ML IJ SOLN
1.0000 mg | Freq: Once | INTRAMUSCULAR | Status: AC
  Administered 2012-09-12: 1 mg via INTRAVENOUS
  Filled 2012-09-12: qty 1

## 2012-09-12 MED ORDER — HYDROCODONE-ACETAMINOPHEN 5-325 MG PO TABS: 2.0000 | ORAL_TABLET | ORAL | Status: DC | PRN

## 2012-09-12 MED ORDER — ONDANSETRON HCL 4 MG/2ML IJ SOLN
4.0000 mg | Freq: Once | INTRAMUSCULAR | Status: AC
  Administered 2012-09-12: 4 mg via INTRAVENOUS
  Filled 2012-09-12: qty 2

## 2012-09-12 MED ORDER — CYCLOBENZAPRINE HCL 10 MG PO TABS: 10.0000 mg | ORAL_TABLET | Freq: Two times a day (BID) | ORAL | Status: DC | PRN

## 2012-09-12 NOTE — ED Provider Notes (Signed)
CSN: [id number]     Arrival date & time 09/12/12  1203 History   First MD Initiated Contact with Patient 09/12/12 1211     Chief Complaint  Patient presents with  . Fall  . Chest Pain   (Consider location/radiation/quality/duration/timing/severity/associated sxs/prior Treatment) HPI Comments: Patient is a 47 year old female with a past medical history of hypertension and fibromyalgia who presents after a syncopal episode that occurred in the waiting room of her doctor's office prior to arrival. Patient reports waiting to have blood work done after her appointment, where she had an argument with her PCP, and suddenly feeling anxious and SOB and then passed out. Patient reports waking up on the floor when EMS got there. Witnesses say she was unconscious for a few seconds. Patient reports having left sided chest pain and associated left arm tingling that lasted about 3 minutes before she passed out. By the time she woke up, her symptoms had resolved. Patient thinks she had a panic attack because she has experienced them before and that is what it felt like. Patient denies any cardiac history or previous DVT/PE. Patient uses Depo-provera injections for birth control. She is not a smoker. Patient is currently asymptomatic but does report a left sided headache.    Past Medical History  Diagnosis Date  . Hypertension   . IBS (irritable bowel syndrome)   . Arthritis   . Fibromyalgia   . Diabetes mellitus     Prediabetes, pt checks cbg occasionally at home  . Nephrolithiasis   . Anemia   . Plantar fasciitis of right foot     wears boot    Past Surgical History  Procedure Laterality Date  . Left shoulder arthroscopic surgery  yrs ago  . Tummy tuck  2005  . Breath tek h pylori  07/11/2011    Procedure: BREATH TEK H PYLORI;  Surgeon: Mariella Saa, MD;  Location: [facility];  Service: General;  Laterality: N/A;  . Gastric roux-en-y  10/01/2011    Procedure: LAPAROSCOPIC ROUX-EN-Y GASTRIC;   Surgeon: Mariella Saa, MD;  Location: [facility];  Service: General;  Laterality: N/A;  . Cholecystectomy  10/01/2011    Procedure: LAPAROSCOPIC CHOLECYSTECTOMY WITH INTRAOPERATIVE CHOLANGIOGRAM;  Surgeon: Mariella Saa, MD;  Location: [facility];  Service: General;  Laterality: N/A;   Family History  Problem Relation Age of Onset  . Heart attack Maternal Grandfather   . Heart disease Maternal Grandfather   . Heart attack Paternal Grandfather   . Cancer Mother     breast  . Obesity Brother   . Cancer Maternal Aunt     Breast  . Multiple sclerosis Maternal Uncle    History  Substance Use Topics  . Smoking status: Former Smoker -- 0.20 packs/day for 7 years    Types: Cigarettes    Quit date: 09/18/2011  . Smokeless tobacco: Never Used  . Alcohol Use: 1.8 oz/week    3 Glasses of wine per week     Comment: 1x week   OB History   Grav Para Term Preterm Abortions TAB SAB Ect Mult Living                 Review of Systems  Respiratory: Positive for shortness of breath.   Cardiovascular: Positive for chest pain.  Neurological: Positive for syncope and headaches.  All other systems reviewed and are negative.    Allergies  Review of patient's allergies indicates no known allergies.  Home Medications   Current Outpatient  Rx  Name  Route  Sig  Dispense  Refill  . acetaminophen (TYLENOL) 500 MG tablet   Oral   Take 1,000 mg by mouth every 6 (six) hours as needed.         . ALPRAZolam (XANAX) 1 MG tablet   Oral   Take 1.5 mg by mouth at bedtime as needed. Anxiety/sleep         . augmented betamethasone dipropionate (DIPROLENE-AF) 0.05 % ointment   Topical   Apply 1 application topically as needed. Rash         . cholecalciferol (VITAMIN D) 1000 UNITS tablet   Oral   Take 10,000 Units by mouth every other day.         . Cyanocobalamin (VITAMIN B-12 IJ)   Injection   Inject as directed every 14 (fourteen) days.         . Iron-Vitamins (GERITOL PO)    Oral   Take 15 mLs by mouth daily.         Marland Kitchen lisinopril-hydrochlorothiazide (PRINZIDE,ZESTORETIC) 20-12.5 MG per tablet   Oral   Take 1 tablet by mouth daily.         . medroxyPROGESTERone (DEPO-PROVERA) 150 MG/ML injection   Intramuscular   Inject 150 mg into the muscle every 3 (three) months.          . metoCLOPramide (REGLAN) 10 MG tablet   Oral   Take 10 mg by mouth as needed. IBS         . Multiple Vitamins-Minerals (MULTIVITAMIN WITH MINERALS) tablet   Oral   Take 1 tablet by mouth daily. Bariatric multi vitamin.         . traMADol (ULTRAM) 50 MG tablet   Oral   Take 100 mg by mouth 2 (two) times daily.            BP 122/76  Pulse 67  Temp(Src) 99.2 F (37.3 C) (Oral)  Resp 19  SpO2 100% Physical Exam  Nursing note and vitals reviewed. Constitutional: She is oriented to person, place, and time. She appears well-developed and well-nourished. No distress.  HENT:  Head: Normocephalic and atraumatic.  Eyes: Conjunctivae and EOM are normal. Pupils are equal, round, and reactive to light.  Neck: Neck supple.  Cardiovascular: Normal rate and regular rhythm.  Exam reveals no gallop and no friction rub.   No murmur heard. Pulmonary/Chest: Effort normal and breath sounds normal. She has no wheezes. She has no rales. She exhibits no tenderness.  Abdominal: Soft. She exhibits no distension. There is no tenderness. There is no rebound and no guarding.  Musculoskeletal: Normal range of motion.  No midline spine tenderness to palpation or step off. Right paraspinal tenderness of cervical spine. Patient reports limited neck ROM due to "soreness"   Lymphadenopathy:    She has no cervical adenopathy.  Neurological: She is alert and oriented to person, place, and time. Coordination normal.  Speech is goal-oriented. Moves limbs without ataxia.   Skin: Skin is warm and dry.  Psychiatric: She has a normal mood and affect. Her behavior is normal.    ED Course   Procedures (including critical care time)   Date: 09/12/2012  Rate: 58  Rhythm: sinus bradycardia  QRS Axis: normal  Intervals: PR shortened  ST/T Wave abnormalities: normal  Conduction Disutrbances:none  Narrative Interpretation: NSR with no acute changes from previous  Old EKG Reviewed: unchanged    Labs Review Labs Reviewed  COMPREHENSIVE METABOLIC PANEL - Abnormal; Notable for the following:  Potassium 3.4 (*)    Alkaline Phosphatase 125 (*)    All other components within normal limits  CBC WITH DIFFERENTIAL  TROPONIN I  D-DIMER, QUANTITATIVE   Imaging Review Dg Chest 2 View  09/12/2012   *RADIOLOGY REPORT*  Clinical Data: Chest pain  CHEST - 2 VIEW  Comparison: 07/16/2011  Findings: Cardiac shadow is stable.  The lungs are clear bilaterally.  No acute bony abnormality is noted.  IMPRESSION: No acute abnormality noted.   Original Report Authenticated By: Alcide Clever, M.D.   Ct Head Wo Contrast  09/12/2012   CLINICAL DATA:  Syncope. Right-sided headache.  EXAM: CT HEAD WITHOUT CONTRAST  TECHNIQUE: Contiguous axial images were obtained from the base of the skull through the vertex without intravenous contrast.  COMPARISON:  04/07/2008  FINDINGS: The brainstem, cerebellum, cerebral peduncles, thalamus, basal ganglia, basilar cisterns, and ventricular system appear within normal limits. No intracranial hemorrhage, mass lesion, or acute CVA.  IMPRESSION: No significant abnormality identified.   Electronically Signed   By: Herbie Baltimore   On: 09/12/2012 14:28    MDM   1. Syncope and collapse   2. Headache     12:45 PM Labs, troponin, chest xray, CT head and d-dimer pending. Vitals stable and patient afebrile.   2:43 PM Labs and imaging unremarkable for acute changes. EKG unremarkable for acute changes. Patient feeling better and will be discharged without further work up. Patient instructed to return with worsening or concerning symptoms.     Emilia Beck,  PA-C 09/12/12 1506

## 2012-09-12 NOTE — ED Notes (Signed)
Pt arrived by [facility] from MD office. Pt was in the waiting room waiting to have blood work done when she stood up and witness stated to EMS that she got tangled up in pants and fell but pt stated she became dizzy and fell hitting head. She then c/o CP and left hand tingling. Denies CP at this time. C/o pain to right front of head.

## 2012-09-12 NOTE — ED Notes (Signed)
Pt is calm and trying to get in contact with her fiance

## 2012-09-12 NOTE — ED Provider Notes (Signed)
Medical screening examination/treatment/procedure(s) were conducted as a shared visit with non-physician practitioner(s) and myself.  I personally evaluated the patient during the encounter  Syncopal episode after argument. Hx anxiety and panic attacks.  Back to baseline now.  CTAB, RRR CN 2-12 intact, no ataxia on finger to nose, no nystagmus, 5/5 strength throughout, no pronator drift, Romberg negative, normal gait.   Glynn Octave, MD 09/12/12 [phone]

## 2012-12-05 ENCOUNTER — Other Ambulatory Visit: Payer: Self-pay | Admitting: Emergency Medicine

## 2012-12-05 MED ORDER — TRAMADOL HCL 50 MG PO TABS: 100.0000 mg | ORAL_TABLET | Freq: Two times a day (BID) | ORAL | Status: DC

## 2012-12-05 MED ORDER — ALPRAZOLAM 1 MG PO TABS: 1.5000 mg | ORAL_TABLET | Freq: Every evening | ORAL | Status: DC | PRN

## 2012-12-25 ENCOUNTER — Encounter: Payer: Self-pay | Admitting: Internal Medicine

## 2012-12-26 ENCOUNTER — Ambulatory Visit: Payer: BC Managed Care – PPO | Admitting: *Deleted

## 2012-12-26 ENCOUNTER — Encounter: Payer: Self-pay | Admitting: *Deleted

## 2012-12-26 VITALS — BP 104/62 | HR 80 | Temp 98.0°F | Resp 18 | Wt 180.0 lb

## 2012-12-26 DIAGNOSIS — Z3049 Encounter for surveillance of other contraceptives: Secondary | ICD-10-CM

## 2012-12-26 MED ORDER — MEDROXYPROGESTERONE ACETATE 150 MG/ML IM SUSP
150.0000 mg | Freq: Once | INTRAMUSCULAR | Status: AC
  Administered 2012-12-26: 150 mg via INTRAMUSCULAR

## 2012-12-26 NOTE — Addendum Note (Signed)
Addended by: Winslow Verrill A on: 12/26/2012 11:06 AM   Modules accepted: Orders

## 2013-01-06 ENCOUNTER — Other Ambulatory Visit: Payer: Self-pay | Admitting: Emergency Medicine

## 2013-01-06 MED ORDER — ALPRAZOLAM 1 MG PO TABS: 1.5000 mg | ORAL_TABLET | Freq: Every evening | ORAL | Status: DC | PRN

## 2013-01-06 MED ORDER — TRAMADOL HCL 50 MG PO TABS: 100.0000 mg | ORAL_TABLET | Freq: Two times a day (BID) | ORAL | Status: DC

## 2013-01-12 ENCOUNTER — Encounter: Payer: Self-pay | Admitting: Physician Assistant

## 2013-02-06 ENCOUNTER — Other Ambulatory Visit: Payer: Self-pay | Admitting: Emergency Medicine

## 2013-02-06 ENCOUNTER — Other Ambulatory Visit: Payer: Self-pay | Admitting: Physician Assistant

## 2013-02-06 MED ORDER — TRAMADOL HCL 50 MG PO TABS: 100.0000 mg | ORAL_TABLET | Freq: Two times a day (BID) | ORAL | Status: DC

## 2013-02-06 MED ORDER — ALPRAZOLAM 1 MG PO TABS: 1.5000 mg | ORAL_TABLET | Freq: Every evening | ORAL | Status: DC | PRN

## 2013-02-16 ENCOUNTER — Ambulatory Visit (INDEPENDENT_AMBULATORY_CARE_PROVIDER_SITE_OTHER): Payer: BC Managed Care – PPO | Admitting: Nurse Practitioner

## 2013-02-16 ENCOUNTER — Encounter: Payer: Self-pay | Admitting: Nurse Practitioner

## 2013-02-16 VITALS — BP 100/62 | HR 68 | Ht 67.25 in | Wt 186.0 lb

## 2013-02-16 DIAGNOSIS — Z0000 Encounter for general adult medical examination without abnormal findings: Secondary | ICD-10-CM

## 2013-02-16 DIAGNOSIS — Z113 Encounter for screening for infections with a predominantly sexual mode of transmission: Secondary | ICD-10-CM

## 2013-02-16 DIAGNOSIS — N9489 Other specified conditions associated with female genital organs and menstrual cycle: Secondary | ICD-10-CM

## 2013-02-16 DIAGNOSIS — Z01419 Encounter for gynecological examination (general) (routine) without abnormal findings: Secondary | ICD-10-CM

## 2013-02-16 LAB — POCT URINALYSIS DIPSTICK
BILIRUBIN UA: NEGATIVE
Blood, UA: NEGATIVE
GLUCOSE UA: NEGATIVE
Ketones, UA: NEGATIVE
Leukocytes, UA: NEGATIVE
NITRITE UA: NEGATIVE
Protein, UA: NEGATIVE
Urobilinogen, UA: NEGATIVE
pH, UA: 6

## 2013-02-16 NOTE — Progress Notes (Signed)
Patient ID: Wendy Valencia, female   DOB: [birth year], 48 y.o.   MRN: [id number] 48 y.o. [id number] Single African American Fe here for NGYN  exam. She is well known to us from the past - but has been to PCP for past several years. Remains on Depo Provera.  Father with recent heart problems and has low pulse rate and now with a pacemaker.  New partner for 1 year and planning to get married in October.  She is having problems with dyspareunia on deep penetration.  Last PUS several > 8 years ago.  Last Depo about 12/26/12.  She does get spotting at times before her next dose of Depo.  Next is due 02/27/13 - she normally gets hers at 10 weeks.  Wants to get STD's done.  No LMP recorded. Patient has had an injection.          Sexually active: yes  The current method of family planning is Depo-Provera injections.    Exercising: no  The patient does not participate in regular exercise at present. Smoker:  yes  Health Maintenance: Pap:  01/14/12 normal with negative HR HPV MMG:  01/08/12, normal Colonoscopy:  06/26/00, flex sig BMD:   2012 TDaP:  2012 Labs: HB:  14.8 Urine:  Negative, pH 6.0   reports that she has been smoking Cigarettes.  She has a 1.75 pack-year smoking history. She has never used smokeless tobacco. She reports that she drinks about 0.6 ounces of alcohol per week. She reports that she does not use illicit drugs.  Past Medical History  Diagnosis Date  . Hypertension   . IBS (irritable bowel syndrome)   . Arthritis   . Fibromyalgia   . Nephrolithiasis   . Anemia   . Plantar fasciitis of right foot     wears boot     Past Surgical History  Procedure Laterality Date  . Left shoulder arthroscopic surgery  yrs ago  . Tummy tuck  2005  . Breath tek h pylori  07/11/2011    Procedure: BREATH TEK H PYLORI;  Surgeon: Mariella SaaBenjamin T Hoxworth, MD;  [facility];  Service: General;  Laterality: N/A;  . Gastric roux-en-y  10/01/2011    Procedure: LAPAROSCOPIC ROUX-EN-Y GASTRIC;   Surgeon: Mariella SaaBenjamin T Hoxworth, MD;  Location: [facility];  Service: General;  Laterality: N/A;  . Cholecystectomy  10/01/2011    Procedure: LAPAROSCOPIC CHOLECYSTECTOMY WITH INTRAOPERATIVE CHOLANGIOGRAM;  Surgeon: Mariella SaaBenjamin T Hoxworth, MD;  Location: [facility];  Service: General;  Laterality: N/A;    Current Outpatient Prescriptions  Medication Sig Dispense Refill  . acetaminophen (TYLENOL) 500 MG tablet Take 1,000 mg by mouth every 6 (six) hours as needed.      . ALPRAZolam (XANAX) 1 MG tablet Take 1.5 tablets (1.5 mg total) by mouth at bedtime as needed. Anxiety/sleep  45 tablet  0  . augmented betamethasone dipropionate (DIPROLENE-AF) 0.05 % ointment Apply 1 application topically as needed. Rash      . lisinopril-hydrochlorothiazide (PRINZIDE,ZESTORETIC) 20-12.5 MG per tablet Take 1 tablet by mouth daily.      . medroxyPROGESTERone (DEPO-PROVERA) 150 MG/ML injection Inject 150 mg into the muscle every 3 (three) months.       . metoCLOPramide (REGLAN) 10 MG tablet Take 10 mg by mouth as needed. IBS      . Multiple Vitamins-Minerals (MULTIVITAMIN WITH MINERALS) tablet Take 1 tablet by mouth daily. Bariatric multi vitamin.      . traMADol (ULTRAM) 50 MG tablet Take 2 tablets (100 mg  total) by mouth 2 (two) times daily.  120 tablet  0   No current facility-administered medications for this visit.    Family History  Problem Relation Age of Onset  . Heart attack Maternal Grandfather   . Heart disease Maternal Grandfather   . Heart attack Paternal Grandfather   . Breast cancer Mother 76    breast  . Obesity Brother   . Breast cancer Maternal Aunt 39    died at age 59  . Multiple sclerosis Maternal Uncle   . Lung cancer Maternal Grandmother     ROS:  Pertinent items are noted in HPI.  Otherwise, a comprehensive ROS was negative.  Exam:   BP 100/62  Pulse 68  Ht 5' 7.25" (1.708 m)  Wt 186 lb (84.369 kg)  BMI 28.92 kg/m2 Height: 5' 7.25" (170.8 cm)  Ht Readings from Last 3 Encounters:   02/16/13 5' 7.25" (1.708 m)  01/24/12 5\' 8"  (1.727 m)  11/29/11 5\' 8"  (1.727 m)    General appearance: alert, cooperative and appears stated age Head: Normocephalic, without obvious abnormality, atraumatic Neck: no adenopathy, supple, symmetrical, trachea midline and thyroid normal to inspection and palpation Lungs: clear to auscultation bilaterally Breasts: normal appearance, no masses or tenderness Heart: regular rate and rhythm Abdomen: soft, non-tender; no masses,  no organomegaly Extremities: extremities normal, atraumatic, no cyanosis or edema Skin: Skin color, texture, turgor normal. No rashes or lesions Lymph nodes: Cervical, supraclavicular, and axillary nodes normal. No abnormal inguinal nodes palpated Neurologic: Grossly normal   Pelvic: External genitalia:  no lesions              Urethra:  normal appearing urethra with no masses, tenderness or lesions              Bartholin's and Skene's: normal                 Vagina: normal appearing vagina with normal color and discharge, no lesions              Cervix: anteverted              Pap taken: yes Bimanual Exam:  Uterus:  normal and tender and uncomfortable even after exam.              Adnexa: no mass, fullness, tenderness               Rectovaginal: Confirms               Anus:  normal sphincter tone, no lesions  A:  Well Woman with normal exam  History of Fibromyalgia  History of Depo Provera for birth control  Dyspareunia ? History of uterine fibroids  R/O STD's  P:   Pap smear as per guidelines   Mammogram is due now and will schedule  Will follow with labs   Will schedule PUS / SHGM to evaluate her history of dyspareunia - she will need Cytotec prior to procedure and maybe even Ativan.  Counseled on breast self exam, mammography screening, adequate intake of calcium and vitamin D, diet and exercise return annually or prn  An After Visit Summary was printed and given to the patient.

## 2013-02-16 NOTE — Patient Instructions (Signed)

## 2013-02-17 ENCOUNTER — Telehealth: Payer: Self-pay | Admitting: Obstetrics and Gynecology

## 2013-02-17 LAB — STD PANEL
HIV: NONREACTIVE
Hepatitis B Surface Ag: NEGATIVE

## 2013-02-17 LAB — HEMOGLOBIN, FINGERSTICK: HEMOGLOBIN, FINGERSTICK: 14.8 g/dL (ref 12.0–16.0)

## 2013-02-17 NOTE — Telephone Encounter (Signed)
Advised patient of $15 copay quoted by insurance company for SHGM/ scheduled SHGM/ advised patient of cancellation policy and cancellation fee.Wendy Valencia.[name]/ssf

## 2013-02-18 LAB — IPS N GONORRHOEA AND CHLAMYDIA BY PCR

## 2013-02-18 LAB — IPS PAP TEST WITH HPV

## 2013-02-18 NOTE — Progress Notes (Signed)
Encounter reviewed by Dr. Brook Silva.  

## 2013-02-26 ENCOUNTER — Ambulatory Visit (INDEPENDENT_AMBULATORY_CARE_PROVIDER_SITE_OTHER): Payer: BC Managed Care – PPO | Admitting: Obstetrics and Gynecology

## 2013-02-26 ENCOUNTER — Encounter: Payer: Self-pay | Admitting: Obstetrics and Gynecology

## 2013-02-26 ENCOUNTER — Other Ambulatory Visit: Payer: Self-pay | Admitting: *Deleted

## 2013-02-26 ENCOUNTER — Other Ambulatory Visit: Payer: BC Managed Care – PPO

## 2013-02-26 ENCOUNTER — Other Ambulatory Visit: Payer: Self-pay | Admitting: Obstetrics and Gynecology

## 2013-02-26 ENCOUNTER — Ambulatory Visit (INDEPENDENT_AMBULATORY_CARE_PROVIDER_SITE_OTHER): Payer: BC Managed Care – PPO

## 2013-02-26 VITALS — BP 122/76 | Wt 183.0 lb

## 2013-02-26 DIAGNOSIS — IMO0002 Reserved for concepts with insufficient information to code with codable children: Secondary | ICD-10-CM

## 2013-02-26 DIAGNOSIS — N949 Unspecified condition associated with female genital organs and menstrual cycle: Secondary | ICD-10-CM

## 2013-02-26 DIAGNOSIS — N926 Irregular menstruation, unspecified: Secondary | ICD-10-CM

## 2013-02-26 NOTE — Patient Instructions (Addendum)
Estradiol vaginal cream What is this medicine? ESTRADIOL (es tra DYE ole) contains the female hormone estrogen. It is used for symptoms of menopause, like vaginal dryness and irritation. This medicine may be used for other purposes; ask your health care provider or pharmacist if you have questions. COMMON BRAND NAME(S): Estrace What should I tell my health care provider before I take this medicine? They need to know if you have any of these conditions: -abnormal vaginal bleeding -blood vessel disease or blood clots -breast, cervical, endometrial, ovarian, liver, or uterine cancer -dementia -diabetes -gallbladder disease -heart disease or recent heart attack -high blood pressure -high cholesterol -high levels of calcium in the blood -hysterectomy -kidney disease -liver disease -migraine headaches -protein C deficiency -protein S deficiency -stroke -systemic lupus erythematosus (SLE) -tobacco smoker -an unusual or allergic reaction to estrogens, other hormones, soy, other medicines, foods, dyes, or preservatives -pregnant or trying to get pregnant -breast-feeding How should I use this medicine? This medicine is for use in the vagina only. Do not take by mouth. Follow the directions on the prescription label. Read package directions carefully before using. Use the special applicator supplied with the cream. Wash hands before and after use. Fill the applicator with the prescribed amount of cream. Lie on your back, part and bend your knees. Insert the applicator into the vagina and push the plunger to expel the cream into the vagina. Wash the applicator with warm soapy water and rinse well. Use exactly as directed for the complete length of time prescribed. Do not stop using except on the advice of your doctor or health care professional. A patient package insert for the product will be given with each prescription and refill. Read this sheet carefully each time. The sheet may change  frequently. Talk to your pediatrician regarding the use of this medicine in children. This medicine is not approved for use in children. Overdosage: If you think you have taken too much of this medicine contact a poison control center or emergency room at once. NOTE: This medicine is only for you. Do not share this medicine with others. What if I miss a dose? If you miss a dose, use it as soon as you can. If it is almost time for your next dose, use only that dose. Do not use double or extra doses. What may interact with this medicine? Do not take this medicine with any of the following medications: -aromatase inhibitors like aminoglutethimide, anastrozole, exemestane, letrozole, testolactone This medicine may also interact with the following medications: -barbiturates used for inducing sleep or treating seizures -carbamazepine -grapefruit juice -medicines for fungal infections like ketoconazole and itraconazole -raloxifene -rifabutin -rifampin -rifapentine -ritonavir -some antibiotics used to treat infections -St. John's Wort -tamoxifen -warfarin This list may not describe all possible interactions. Give your health care provider a list of all the medicines, herbs, non-prescription drugs, or dietary supplements you use. Also tell them if you smoke, drink alcohol, or use illegal drugs. Some items may interact with your medicine. What should I watch for while using this medicine? Visit your health care professional for regular checks on your progress. You will need a regular breast and pelvic exam. You should also discuss the need for regular mammograms with your health care professional, and follow his or her guidelines. This medicine can make your body retain fluid, making your fingers, hands, or ankles swell. Your blood pressure can go up. Contact your doctor or health care professional if you feel you are retaining fluid. If you have  any reason to think you are pregnant, stop taking  this medicine at once and contact your doctor or health care professional. Tobacco smoking increases the risk of getting a blood clot or having a stroke, especially if you are more than 47 years old. You are strongly advised not to smoke. If you wear contact lenses and notice visual changes, or if the lenses begin to feel uncomfortable, consult your eye care specialist. If you are going to have elective surgery, you may need to stop taking this medicine beforehand. Consult your health care professional for advice prior to scheduling the surgery. What side effects may I notice from receiving this medicine? Side effects that you should report to your doctor or health care professional as soon as possible: -allergic reactions like skin rash, itching or hives, swelling of the face, lips, or tongue -breast tissue changes or discharge -changes in vision -chest pain -confusion, trouble speaking or understanding -dark urine -general ill feeling or flu-like symptoms -light-colored stools -nausea, vomiting -pain, swelling, warmth in the leg -right upper belly pain -severe headaches -shortness of breath -sudden numbness or weakness of the face, arm or leg -trouble walking, dizziness, loss of balance or coordination -unusual vaginal bleeding -yellowing of the eyes or skin Side effects that usually do not require medical attention (report to your doctor or health care professional if they continue or are bothersome): -hair loss -increased hunger or thirst -increased urination -symptoms of vaginal infection like itching, irritation or unusual discharge -unusually weak or tired This list may not describe all possible side effects. Call your doctor for medical advice about side effects. You may report side effects to FDA at 1-800-FDA-1088. Where should I keep my medicine? Keep out of the reach of children. Store at room temperature between 15 and 30 degrees C (59 and 86 degrees F). Protect from  temperatures above 40 degrees C (104 degrees C). Do not freeze. Throw away any unused medicine after the expiration date. NOTE: This sheet is a summary. It may not cover all possible information. If you have questions about this medicine, talk to your doctor, pharmacist, or health care provider.  2014, Elsevier/Gold Standard. (2010-04-05 09:18:12)  Endometrial Biopsy, Care After Refer to this sheet in the next few weeks. These instructions provide you with information on caring for yourself after your procedure. Your health care provider may also give you more specific instructions. Your treatment has been planned according to current medical practices, but problems sometimes occur. Call your health care provider if you have any problems or questions after your procedure. WHAT TO EXPECT AFTER THE PROCEDURE After your procedure, it is typical to have the following:  You may have mild cramping and a small amount of vaginal bleeding for a few days after the procedure. This is normal. HOME CARE INSTRUCTIONS  Only take over-the-counter or prescription medicine as directed by your health care provider.  Do not douche, use tampons, or have sexual intercourse until your health care provider approves.  Follow your health care provider's instructions regarding any activity restrictions, such as strenuous exercise or heavy lifting. SEEK MEDICAL CARE IF:  You have heavy bleeding or bleeding longer than 2 days after the procedure.  You have bad smelling drainage from your vagina.  You have a fever and chills.  Youhave severe lower stomach (abdominal) pain. SEEK IMMEDIATE MEDICAL CARE IF:  You have severe cramps in your stomach or back.  You pass large blood clots.  Your bleeding increases.  You become weak or  lightheaded, or you pass out. Document Released: 10/22/2012 Document Reviewed: 06/18/2012 [facility] Patient Information 2014 [place], [place].

## 2013-02-26 NOTE — Progress Notes (Signed)
Subjective  Patient is here for pelvic ultrasound, sonohysterogram, and endometrial biopsy.  Has been on Depo Provera for many years and has random bleeding.  Takes the Depo every 10 in stead of 12 weeks.  Also having dyspareunia.  Feels like something is being hit.  Does not occur every time.  Decreased libido.   Cleans her cervix regularly.  (Long fingernails.)  Had negative GC/CT recently at annual examination.  Pap showed endometrial cells and otherwise was normal.   Had gastric bypass and has lost about 80 pounds.   Objective  Ultrasound showing retroverted uterus with 3 small fibroids 0.64 cm, 0.64 cm, 0.76 cm.  EMS 3.93 mm.  Ovaries normal. No free fluid.     Procedures - sonohysterogram and endometrial biopsy  Procedure - sonohysterogram Consent performed. Speculum placed in vagina. Sterile prep of cervix with betadine. Cannula placed inside endometrial cavity without difficult. Speculum removed. Sterile saline injected. No  filling defect noted. Cannula removed. No complication.   Procedure - endometrial biopsy Consent performed. Speculum place in vagina.  Sterile prep of cervix with betadine.  Paracervical block performed with 10 cc 1% lidocaine.  Pipelle placed to 7 cm without difficulty twice. Tissue obtained and sent to pathology. Speculum removed.  No complications.  Motrin 600 mg given to patient.   Assessment  Irregular bleeding on Depo Provera long term.   Endometrial cells on recent pap.  Dyspareunia.  I suspect atrophy.   Plan  Follow up on EMB. After report is back and normal, can prescribe vaginal estrogen cream.  Call for fever, increasing pain or heavy vaginal bleeding.  Stop cleaning your cervix. Follow up in 2 months for a recheck after starting anticipated estrogen cream in the vagina.  If pain is persisting, will re-evaluate.   15 minutes face to face time of which over 50% was spent in counseling.   After visit summary to  patient.

## 2013-02-27 ENCOUNTER — Ambulatory Visit (INDEPENDENT_AMBULATORY_CARE_PROVIDER_SITE_OTHER): Payer: BC Managed Care – PPO

## 2013-02-27 DIAGNOSIS — Z3049 Encounter for surveillance of other contraceptives: Secondary | ICD-10-CM

## 2013-02-27 MED ORDER — MEDROXYPROGESTERONE ACETATE 150 MG/ML IM SUSP
150.0000 mg | Freq: Once | INTRAMUSCULAR | Status: AC
  Administered 2013-02-27: 150 mg via INTRAMUSCULAR

## 2013-02-27 NOTE — Progress Notes (Signed)
Patient ID: Wendy NineLisa A Pizzini, female   DOB: 1965/07/02, 48 y.o.   MRN: [id number] Patient here today for her Depo injection, patient received 150 mg, Medroxyprogesterone,IM left glute, patient tolerated well.

## 2013-03-02 ENCOUNTER — Telehealth: Payer: Self-pay | Admitting: Obstetrics and Gynecology

## 2013-03-02 LAB — IPS CERVICAL/ECC/EMB/VULVAR/VAGINAL BIOPSY

## 2013-03-02 MED ORDER — ESTRADIOL 0.1 MG/GM VA CREA: TOPICAL_CREAM | VAGINAL | Status: DC

## 2013-03-02 NOTE — Telephone Encounter (Signed)
Phone call to patient  EMB results - benign.   OK to start Estrace cream 1/2 gram pv at hs for 2 weeks and then 1/2 gram pv at hs twice weekly. Risks fo DVT, PE, MI, and stroke discussed.  Previously discussed breast cancer risks if has an undiagnosed breast cancer.   Follow up in 2 months.

## 2013-03-06 ENCOUNTER — Other Ambulatory Visit: Payer: Self-pay | Admitting: *Deleted

## 2013-03-06 MED ORDER — LISINOPRIL-HYDROCHLOROTHIAZIDE 20-12.5 MG PO TABS: 1.0000 | ORAL_TABLET | Freq: Every day | ORAL | Status: DC

## 2013-03-09 ENCOUNTER — Other Ambulatory Visit: Payer: Self-pay | Admitting: Emergency Medicine

## 2013-03-09 MED ORDER — TRAMADOL HCL 50 MG PO TABS: 100.0000 mg | ORAL_TABLET | Freq: Two times a day (BID) | ORAL | Status: DC

## 2013-03-09 MED ORDER — ALPRAZOLAM 1 MG PO TABS: 1.5000 mg | ORAL_TABLET | Freq: Every evening | ORAL | Status: DC | PRN

## 2013-04-06 ENCOUNTER — Other Ambulatory Visit: Payer: Self-pay | Admitting: Emergency Medicine

## 2013-04-07 ENCOUNTER — Encounter: Payer: Self-pay | Admitting: Emergency Medicine

## 2013-04-07 ENCOUNTER — Ambulatory Visit (INDEPENDENT_AMBULATORY_CARE_PROVIDER_SITE_OTHER): Payer: BC Managed Care – PPO | Admitting: Emergency Medicine

## 2013-04-07 VITALS — BP 118/90 | HR 74 | Temp 98.0°F | Resp 18 | Ht 67.75 in | Wt 190.0 lb

## 2013-04-07 DIAGNOSIS — I10 Essential (primary) hypertension: Secondary | ICD-10-CM

## 2013-04-07 DIAGNOSIS — Z79899 Other long term (current) drug therapy: Secondary | ICD-10-CM

## 2013-04-07 DIAGNOSIS — R5383 Other fatigue: Secondary | ICD-10-CM

## 2013-04-07 DIAGNOSIS — R7309 Other abnormal glucose: Secondary | ICD-10-CM

## 2013-04-07 DIAGNOSIS — R5381 Other malaise: Secondary | ICD-10-CM

## 2013-04-07 DIAGNOSIS — E782 Mixed hyperlipidemia: Secondary | ICD-10-CM

## 2013-04-07 DIAGNOSIS — D649 Anemia, unspecified: Secondary | ICD-10-CM

## 2013-04-07 DIAGNOSIS — E559 Vitamin D deficiency, unspecified: Secondary | ICD-10-CM

## 2013-04-07 DIAGNOSIS — R350 Frequency of micturition: Secondary | ICD-10-CM

## 2013-04-07 LAB — CBC WITH DIFFERENTIAL/PLATELET
Basophils Absolute: 0 10*3/uL (ref 0.0–0.1)
Basophils Relative: 0 % (ref 0–1)
EOS ABS: 0.1 10*3/uL (ref 0.0–0.7)
Eosinophils Relative: 1 % (ref 0–5)
HCT: 41.7 % (ref 36.0–46.0)
HEMOGLOBIN: 14.2 g/dL (ref 12.0–15.0)
Lymphocytes Relative: 40 % (ref 12–46)
Lymphs Abs: 2.5 10*3/uL (ref 0.7–4.0)
MCH: 30.1 pg (ref 26.0–34.0)
MCHC: 34.1 g/dL (ref 30.0–36.0)
MCV: 88.3 fL (ref 78.0–100.0)
MONOS PCT: 7 % (ref 3–12)
Monocytes Absolute: 0.4 10*3/uL (ref 0.1–1.0)
Neutro Abs: 3.2 10*3/uL (ref 1.7–7.7)
Neutrophils Relative %: 52 % (ref 43–77)
Platelets: 308 10*3/uL (ref 150–400)
RBC: 4.72 MIL/uL (ref 3.87–5.11)
RDW: 13.6 % (ref 11.5–15.5)
WBC: 6.2 10*3/uL (ref 4.0–10.5)

## 2013-04-07 MED ORDER — TRAMADOL HCL 50 MG PO TABS: 100.0000 mg | ORAL_TABLET | Freq: Two times a day (BID) | ORAL | Status: DC

## 2013-04-07 MED ORDER — ALPRAZOLAM 1 MG PO TABS: 1.5000 mg | ORAL_TABLET | Freq: Every evening | ORAL | Status: DC | PRN

## 2013-04-07 NOTE — Patient Instructions (Signed)
Flatulence There are good germs in your gut to help you digest food. Gas is produced by these germs and released from your bottom. Most people release 3 to 4 quarts of gas every day. This is normal. HOME CARE  Eat or drink less of the foods or liquids that give you gas.  Take the time to chew your food well. Talk less while you eat.  Do not suck on ice or hard candy.  Sip slowly. Stir some of the bubbles out of fizzy drinks with a spoon or straw.  Avoid chewing gum or smoking.  Ask your doctor about liquids and tablets that may help control burping and gas.  Only take medicine as told by your doctor. GET HELP RIGHT AWAY IF:   There is discomfort when you burp or pass gas.  You throw up (vomit) when you burp.  Poop (stool) comes out when you pass gas.  Your belly is puffy (swollen) and hard. MAKE SURE YOU:   Understand these instructions.  Will watch your condition.  Will get help right away if you are not doing well or get worse. Document Released: 11/04/2007 Document Revised: 03/26/2011 Document Reviewed: 11/04/2007 [facility] Patient Information 2014 [place]Care, [place]. Urinary Tract Infection A urinary tract infection (UTI) can occur any place along the urinary tract. The tract includes the kidneys, ureters, bladder, and urethra. A type of germ called bacteria often causes a UTI. UTIs are often helped with antibiotic medicine.  HOME CARE   If given, take antibiotics as told by your doctor. Finish them even if you start to feel better.  Drink enough fluids to keep your pee (urine) clear or pale yellow.  Avoid tea, drinks with caffeine, and bubbly (carbonated) drinks.  Pee often. Avoid holding your pee in for a long time.  Pee before and after having sex (intercourse).  Wipe from front to back after you poop (bowel movement) if you are a woman. Use each tissue only once. GET HELP RIGHT AWAY IF:   You have back pain.  You have lower belly (abdominal) pain.  You have  chills.  You feel sick to your stomach (nauseous).  You throw up (vomit).  Your burning or discomfort with peeing does not go away.  You have a fever.  Your symptoms are not better in 3 days. MAKE SURE YOU:   Understand these instructions.  Will watch your condition.  Will get help right away if you are not doing well or get worse. Document Released: 06/20/2007 Document Revised: 09/26/2011 Document Reviewed: 08/02/2011 [facility] Patient Information 2014 [place]ExitCare, [place]LL[place].

## 2013-04-07 NOTE — Progress Notes (Signed)
Subjective:    Patient ID: Wendy Valencia, female    DOB: [birth year], 48 y.o.   MRN: [id number]  HPI Comments: 48 yo AAF presents for 3 month F/U for HTN, Cholesterol, Pre-Dm, D. Deficient. She has been doing well overall. She has noticed mild weight gain with new addition of HRT by Gyn for ? Endometriosis/ vaginal atrophy. She has not been exercising as much and has noticed mild fatigue. She decreased her vitamins AD at last OV for maintenance with Gastric bypass surgery and is concerned may be contributing to fatigue. She notes she is eating healthy. Her BP has been good at home.   She has noted stronger urine with mild frequency. She has 4 cups of coffee in the a.m. She has been eating more asparagus lately as well. She is trying to increase her h2o intake.     Medication List       This list is accurate as of: 04/07/13  4:59 PM.  Always use your most recent med list.               ALPRAZolam 1 MG tablet  Commonly known as:  XANAX  Take 1.5 tablets (1.5 mg total) by mouth at bedtime as needed. Anxiety/sleep     augmented betamethasone dipropionate 0.05 % ointment  Commonly known as:  DIPROLENE-AF  Apply 1 application topically as needed. Rash     estradiol 0.1 MG/GM vaginal cream  Commonly known as:  ESTRACE  Use 1/2 g vaginally every night for 2 weeks, then use 1/2 g vaginally twice a week.     EXCEDRIN EXTRA STRENGTH PO  Take by mouth daily as needed.     lisinopril-hydrochlorothiazide 20-12.5 MG per tablet  Commonly known as:  PRINZIDE,ZESTORETIC  Take 1 tablet by mouth daily.     medroxyPROGESTERone 150 MG/ML injection  Commonly known as:  DEPO-PROVERA  Inject 150 mg into the muscle every 3 (three) months.     metoCLOPramide 10 MG tablet  Commonly known as:  REGLAN  Take 10 mg by mouth as needed. IBS     multivitamin with minerals tablet  Take 1 tablet by mouth daily. Bariatric multi vitamin.     traMADol 50 MG tablet  Commonly known as:  ULTRAM  Take 2  tablets (100 mg total) by mouth 2 (two) times daily.       No Known Allergies  Past Medical History  Diagnosis Date  . Hypertension   . IBS (irritable bowel syndrome)   . Arthritis   . Fibromyalgia   . Nephrolithiasis   . Anemia   . Plantar fasciitis of right foot     wears boot       Review of Systems  Constitutional: Positive for fatigue.  Genitourinary: Positive for frequency.  All other systems reviewed and are negative.   BP 118/90  Pulse 74  Temp(Src) 98 F (36.7 C) (Temporal)  Resp 18  Ht 5' 7.75" (1.721 m)  Wt 190 lb (86.183 kg)  BMI 29.10 kg/m2     Objective:   Physical Exam  Nursing note and vitals reviewed. Constitutional: She is oriented to person, place, and time. She appears well-developed and well-nourished. No distress.  HENT:  Head: Normocephalic and atraumatic.  Right Ear: External ear normal.  Left Ear: External ear normal.  Nose: Nose normal.  Mouth/Throat: Oropharynx is clear and moist.  Eyes: Conjunctivae and EOM are normal.  Neck: Normal range of motion. Neck supple. No JVD present. No thyromegaly present.  Cardiovascular: Normal rate, regular rhythm, normal heart sounds and intact distal pulses.   Pulmonary/Chest: Effort normal and breath sounds normal.  Abdominal: Soft. Bowel sounds are normal. She exhibits no distension and no mass. There is no tenderness. There is no rebound and no guarding.  Musculoskeletal: Normal range of motion. She exhibits no edema and no tenderness.  Lymphadenopathy:    She has no cervical adenopathy.  Neurological: She is alert and oriented to person, place, and time. No cranial nerve deficit.  Skin: Skin is warm and dry. No rash noted. No erythema. No pallor.  Psychiatric: She has a normal mood and affect. Her behavior is normal. Judgment and thought content normal.          Assessment & Plan:  1.  3 month F/U for HTN, Cholesterol- diet, Pre-Dm, D. Deficient. Needs healthy diet, cardio QD and obtain  healthy weight. Check Labs, Check BP if >130/80 call office  2. Urine frequency/ Odor- Check labs, hygiene explained, decrease caffeine

## 2013-04-08 LAB — LIPID PANEL
Cholesterol: 105 mg/dL (ref 0–200)
HDL: 51 mg/dL (ref >39)
LDL Cholesterol: 43 mg/dL (ref 0–99)
Total CHOL/HDL Ratio: 2.1 Ratio
Triglycerides: 53 mg/dL (ref <150)
VLDL: 11 mg/dL (ref 0–40)

## 2013-04-08 LAB — HEPATIC FUNCTION PANEL
ALT: 13 U/L (ref 0–35)
AST: 12 U/L (ref 0–37)
Albumin: 4.3 g/dL (ref 3.5–5.2)
Alkaline Phosphatase: 137 U/L — ABNORMAL HIGH (ref 39–117)
BILIRUBIN INDIRECT: 0.3 mg/dL (ref 0.2–1.2)
Bilirubin, Direct: 0.1 mg/dL (ref 0.0–0.3)
TOTAL PROTEIN: 6.7 g/dL (ref 6.0–8.3)
Total Bilirubin: 0.4 mg/dL (ref 0.2–1.2)

## 2013-04-08 LAB — URINALYSIS, ROUTINE W REFLEX MICROSCOPIC
Bilirubin Urine: NEGATIVE
Glucose, UA: NEGATIVE mg/dL
HGB URINE DIPSTICK: NEGATIVE
KETONES UR: NEGATIVE mg/dL
Leukocytes, UA: NEGATIVE
Nitrite: NEGATIVE
PROTEIN: NEGATIVE mg/dL
Specific Gravity, Urine: 1.024 (ref 1.005–1.030)
UROBILINOGEN UA: 1 mg/dL (ref 0.0–1.0)
pH: 6 (ref 5.0–8.0)

## 2013-04-08 LAB — BASIC METABOLIC PANEL WITH GFR
BUN: 10 mg/dL (ref 6–23)
CALCIUM: 9.2 mg/dL (ref 8.4–10.5)
CHLORIDE: 107 meq/L (ref 96–112)
CO2: 23 meq/L (ref 19–32)
CREATININE: 0.63 mg/dL (ref 0.50–1.10)
GFR, Est African American: >89 mL/min
GFR, Est Non African American: >89 mL/min
GLUCOSE: 112 mg/dL — AB (ref 70–99)
Potassium: 4.3 mEq/L (ref 3.5–5.3)
Sodium: 142 mEq/L (ref 135–145)

## 2013-04-08 LAB — IRON AND TIBC
%SAT: 16 % — AB (ref 20–55)
IRON: 49 ug/dL (ref 42–145)
TIBC: 308 ug/dL (ref 250–470)
UIBC: 259 ug/dL (ref 125–400)

## 2013-04-08 LAB — HEMOGLOBIN A1C
Hgb A1c MFr Bld: 6 % — ABNORMAL HIGH (ref <5.7)
Mean Plasma Glucose: 126 mg/dL — ABNORMAL HIGH (ref <117)

## 2013-04-08 LAB — VITAMIN D 25 HYDROXY (VIT D DEFICIENCY, FRACTURES): VIT D 25 HYDROXY: 27 ng/mL — AB (ref 30–89)

## 2013-04-08 LAB — URINE CULTURE
Colony Count: NO GROWTH
Organism ID, Bacteria: NO GROWTH

## 2013-04-08 LAB — VITAMIN B12: VITAMIN B 12: 531 pg/mL (ref 211–911)

## 2013-04-08 LAB — MAGNESIUM: Magnesium: 2.1 mg/dL (ref 1.5–2.5)

## 2013-04-08 LAB — TSH: TSH: 1.11 u[IU]/mL (ref 0.350–4.500)

## 2013-04-08 LAB — INSULIN, FASTING: Insulin fasting, serum: 45 u[IU]/mL — ABNORMAL HIGH (ref 3–28)

## 2013-04-09 ENCOUNTER — Other Ambulatory Visit: Payer: Self-pay | Admitting: Emergency Medicine

## 2013-04-09 MED ORDER — VITAMIN D (ERGOCALCIFEROL) 1.25 MG (50000 UNIT) PO CAPS: 50000.0000 [IU] | ORAL_CAPSULE | ORAL | Status: DC

## 2013-05-04 ENCOUNTER — Ambulatory Visit: Payer: BC Managed Care – PPO | Admitting: Obstetrics and Gynecology

## 2013-05-07 ENCOUNTER — Other Ambulatory Visit: Payer: Self-pay | Admitting: Emergency Medicine

## 2013-05-07 MED ORDER — TRAMADOL HCL 50 MG PO TABS: 100.0000 mg | ORAL_TABLET | Freq: Two times a day (BID) | ORAL | Status: DC

## 2013-05-07 MED ORDER — ALPRAZOLAM 1 MG PO TABS: 1.5000 mg | ORAL_TABLET | Freq: Every evening | ORAL | Status: DC | PRN

## 2013-05-15 ENCOUNTER — Ambulatory Visit: Payer: BLUE CROSS/BLUE SHIELD

## 2013-05-15 ENCOUNTER — Encounter (INDEPENDENT_AMBULATORY_CARE_PROVIDER_SITE_OTHER): Payer: Self-pay

## 2013-05-15 DIAGNOSIS — Z309 Encounter for contraceptive management, unspecified: Secondary | ICD-10-CM

## 2013-05-15 MED ORDER — MEDROXYPROGESTERONE ACETATE 150 MG/ML IM SUSP
150.0000 mg | Freq: Once | INTRAMUSCULAR | Status: AC
  Administered 2013-05-15: 150 mg via INTRAMUSCULAR

## 2013-05-15 NOTE — Progress Notes (Signed)
Patient ID: Wendy NineLisa A Trevathan, female   DOB: January 07, 1966, 48 y.o.   MRN: [id number] Patient here today for Depo-Provera injection. Patient received 150 mg injection to right glut. Patient tolerated well.

## 2013-05-21 ENCOUNTER — Ambulatory Visit: Payer: BC Managed Care – PPO | Admitting: Obstetrics and Gynecology

## 2013-06-04 ENCOUNTER — Other Ambulatory Visit: Payer: Self-pay | Admitting: Emergency Medicine

## 2013-07-01 ENCOUNTER — Encounter (HOSPITAL_COMMUNITY): Payer: Self-pay | Admitting: Emergency Medicine

## 2013-07-01 ENCOUNTER — Emergency Department (HOSPITAL_COMMUNITY)
Admission: EM | Admit: 2013-07-01 | Discharge: 2013-07-01 | Disposition: A | Discharge status: Home / Self Care | Payer: BC Managed Care – PPO | Attending: Emergency Medicine | Admitting: Emergency Medicine

## 2013-07-01 DIAGNOSIS — IMO0001 Reserved for inherently not codable concepts without codable children: Secondary | ICD-10-CM | POA: Insufficient documentation

## 2013-07-01 DIAGNOSIS — S199XXA Unspecified injury of neck, initial encounter: Principal | ICD-10-CM

## 2013-07-01 DIAGNOSIS — M542 Cervicalgia: Secondary | ICD-10-CM

## 2013-07-01 DIAGNOSIS — F17200 Nicotine dependence, unspecified, uncomplicated: Secondary | ICD-10-CM | POA: Insufficient documentation

## 2013-07-01 DIAGNOSIS — S0990XA Unspecified injury of head, initial encounter: Secondary | ICD-10-CM | POA: Insufficient documentation

## 2013-07-01 DIAGNOSIS — Z8719 Personal history of other diseases of the digestive system: Secondary | ICD-10-CM | POA: Insufficient documentation

## 2013-07-01 DIAGNOSIS — S0993XA Unspecified injury of face, initial encounter: Secondary | ICD-10-CM | POA: Insufficient documentation

## 2013-07-01 DIAGNOSIS — Y92410 Unspecified street and highway as the place of occurrence of the external cause: Secondary | ICD-10-CM | POA: Insufficient documentation

## 2013-07-01 DIAGNOSIS — Y9389 Activity, other specified: Secondary | ICD-10-CM | POA: Insufficient documentation

## 2013-07-01 DIAGNOSIS — I10 Essential (primary) hypertension: Secondary | ICD-10-CM | POA: Insufficient documentation

## 2013-07-01 DIAGNOSIS — Z8739 Personal history of other diseases of the musculoskeletal system and connective tissue: Secondary | ICD-10-CM | POA: Insufficient documentation

## 2013-07-01 DIAGNOSIS — Z862 Personal history of diseases of the blood and blood-forming organs and certain disorders involving the immune mechanism: Secondary | ICD-10-CM | POA: Insufficient documentation

## 2013-07-01 DIAGNOSIS — Z87442 Personal history of urinary calculi: Secondary | ICD-10-CM | POA: Insufficient documentation

## 2013-07-01 LAB — RAPID URINE DRUG SCREEN, HOSP PERFORMED
Amphetamines: NOT DETECTED
BENZODIAZEPINES: POSITIVE — AB
Barbiturates: NOT DETECTED
COCAINE: NOT DETECTED
OPIATES: NOT DETECTED
Tetrahydrocannabinol: NOT DETECTED

## 2013-07-01 MED ORDER — CYCLOBENZAPRINE HCL 10 MG PO TABS: 10.0000 mg | ORAL_TABLET | Freq: Two times a day (BID) | ORAL | Status: DC | PRN

## 2013-07-01 MED ORDER — TRAMADOL HCL 50 MG PO TABS: 50.0000 mg | ORAL_TABLET | Freq: Four times a day (QID) | ORAL | Status: DC | PRN

## 2013-07-01 NOTE — Discharge Instructions (Signed)
Take Tramadol as needed for pain. Take Flexeril as needed for muscle spasm. You may take these medications together. Refer to attached documents for more information.  °
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
## 2013-07-01 NOTE — ED Provider Notes (Signed)
Medical screening examination/treatment/procedure(s) were performed by non-physician practitioner and as supervising physician I was immediately available for consultation/collaboration.   EKG Interpretation None      Iva Knapp, MD, FACEP   Iva L Knapp, MD 07/01/13 2211 

## 2013-07-03 ENCOUNTER — Other Ambulatory Visit: Payer: Self-pay | Admitting: Emergency Medicine

## 2013-07-15 ENCOUNTER — Other Ambulatory Visit: Payer: Self-pay | Admitting: Emergency Medicine

## 2013-07-31 ENCOUNTER — Ambulatory Visit: Payer: BLUE CROSS/BLUE SHIELD | Admitting: *Deleted

## 2013-07-31 DIAGNOSIS — Z3049 Encounter for surveillance of other contraceptives: Secondary | ICD-10-CM

## 2013-07-31 MED ORDER — MEDROXYPROGESTERONE ACETATE 150 MG/ML IM SUSP
150.0000 mg | Freq: Once | INTRAMUSCULAR | Status: AC
  Administered 2013-07-31: 150 mg via INTRAMUSCULAR

## 2013-08-06 ENCOUNTER — Other Ambulatory Visit: Payer: Self-pay | Admitting: Emergency Medicine

## 2013-08-14 ENCOUNTER — Other Ambulatory Visit: Payer: Self-pay | Admitting: Emergency Medicine

## 2013-08-14 DIAGNOSIS — D649 Anemia, unspecified: Secondary | ICD-10-CM

## 2013-08-14 DIAGNOSIS — Z0000 Encounter for general adult medical examination without abnormal findings: Secondary | ICD-10-CM

## 2013-08-17 ENCOUNTER — Encounter: Payer: Self-pay | Admitting: Emergency Medicine

## 2013-08-17 ENCOUNTER — Ambulatory Visit (INDEPENDENT_AMBULATORY_CARE_PROVIDER_SITE_OTHER): Payer: BC Managed Care – PPO | Admitting: Emergency Medicine

## 2013-08-17 VITALS — BP 122/80 | HR 76 | Temp 97.9°F | Resp 18 | Ht 67.75 in | Wt 187.8 lb

## 2013-08-17 DIAGNOSIS — Z0000 Encounter for general adult medical examination without abnormal findings: Secondary | ICD-10-CM

## 2013-08-17 DIAGNOSIS — E559 Vitamin D deficiency, unspecified: Secondary | ICD-10-CM

## 2013-08-17 DIAGNOSIS — Z1231 Encounter for screening mammogram for malignant neoplasm of breast: Secondary | ICD-10-CM

## 2013-08-17 DIAGNOSIS — Z79899 Other long term (current) drug therapy: Secondary | ICD-10-CM

## 2013-08-17 DIAGNOSIS — D649 Anemia, unspecified: Secondary | ICD-10-CM

## 2013-08-17 DIAGNOSIS — Z780 Asymptomatic menopausal state: Secondary | ICD-10-CM

## 2013-08-17 DIAGNOSIS — I10 Essential (primary) hypertension: Secondary | ICD-10-CM

## 2013-08-17 DIAGNOSIS — M79609 Pain in unspecified limb: Secondary | ICD-10-CM

## 2013-08-17 DIAGNOSIS — M2550 Pain in unspecified joint: Secondary | ICD-10-CM

## 2013-08-17 LAB — CBC WITH DIFFERENTIAL/PLATELET
Basophils Absolute: 0 10*3/uL (ref 0.0–0.1)
Basophils Relative: 0 % (ref 0–1)
EOS ABS: 0 10*3/uL (ref 0.0–0.7)
Eosinophils Relative: 0 % (ref 0–5)
HCT: 43 % (ref 36.0–46.0)
Hemoglobin: 15 g/dL (ref 12.0–15.0)
LYMPHS ABS: 1.6 10*3/uL (ref 0.7–4.0)
LYMPHS PCT: 33 % (ref 12–46)
MCH: 30.1 pg (ref 26.0–34.0)
MCHC: 34.9 g/dL (ref 30.0–36.0)
MCV: 86.2 fL (ref 78.0–100.0)
Monocytes Absolute: 0.3 10*3/uL (ref 0.1–1.0)
Monocytes Relative: 7 % (ref 3–12)
Neutro Abs: 2.9 10*3/uL (ref 1.7–7.7)
Neutrophils Relative %: 60 % (ref 43–77)
PLATELETS: 328 10*3/uL (ref 150–400)
RBC: 4.99 MIL/uL (ref 3.87–5.11)
RDW: 14.1 % (ref 11.5–15.5)
WBC: 4.8 10*3/uL (ref 4.0–10.5)

## 2013-08-17 NOTE — Patient Instructions (Signed)
Muscle Pain  Muscle pain (myalgia) may be caused by many things, including:   Overuse or muscle strain, especially if you are not in shape. This is the most common cause of muscle pain.   Injury.   Bruises.   Viruses, such as the flu.   Infectious diseases.   Fibromyalgia, which is a chronic condition that causes muscle tenderness, fatigue, and headache.   Autoimmune diseases, including lupus.   Certain drugs, including ACE inhibitors and statins.  Muscle pain may be mild or severe. In most cases, the pain lasts only a short time and goes away without treatment. To diagnose the cause of your muscle pain, your health care provider will take your medical history. This means he or she will ask you when your muscle pain began and what has been happening. If you have not had muscle pain for very long, your health care provider may want to wait before doing much testing. If your muscle pain has lasted a long time, your health care provider may want to run tests right away. If your health care provider thinks your muscle pain may be caused by illness, you may need to have additional tests to rule out certain conditions.   Treatment for muscle pain depends on the cause. Home care is often enough to relieve muscle pain. Your health care provider may also prescribe anti-inflammatory medicine.  HOME CARE INSTRUCTIONS  Watch your condition for any changes. The following actions may help to lessen any discomfort you are feeling:   Only take over-the-counter or prescription medicines as directed by your health care provider.   Apply ice to the sore muscle:   Put ice in a plastic bag.   Place a towel between your skin and the bag.   Leave the ice on for 15-20 minutes, 3-4 times a day.   You may alternate applying hot and cold packs to the muscle as directed by your health care provider.   If overuse is causing your muscle pain, slow down your activities until the pain goes away.   Remember that it is normal to feel  some muscle pain after starting a workout program. Muscles that have not been used often will be sore at first.   Do regular, gentle exercises if you are not usually active.   Warm up before exercising to lower your risk of muscle pain.   Do not continue working out if the pain is very bad. Bad pain could mean you have injured a muscle.  SEEK MEDICAL CARE IF:   Your muscle pain gets worse, and medicines do not help.   You have muscle pain that lasts longer than 3 days.   You have a rash or fever along with muscle pain.   You have muscle pain after a tick bite.   You have muscle pain while working out, even though you are in good physical condition.   You have redness, soreness, or swelling along with muscle pain.   You have muscle pain after starting a new medicine or changing the dose of a medicine.  SEEK IMMEDIATE MEDICAL CARE IF:   You have trouble breathing.   You have trouble swallowing.   You have muscle pain along with a stiff neck, fever, and vomiting.   You have severe muscle weakness or cannot move part of your body.  MAKE SURE YOU:    Understand these instructions.   Will watch your condition.   Will get help right away if you are not   questions you have with your health care provider. Arthralgia Arthralgia is joint pain. A joint is a place where two bones meet. Joint pain can happen for many reasons. The joint can be bruised, stiff, infected, or weak from aging. Pain usually goes away after resting and taking medicine for soreness.  HOME CARE  Rest the joint as told by your doctor.  Keep the sore joint raised (elevated) for the first 24 hours.  Put ice on the joint area.  Put ice in a plastic  bag.  Place a towel between your skin and the bag.  Leave the ice on for 15-20 minutes, 03-04 times a day.  Wear your splint, casting, elastic bandage, or sling as told by your doctor.  Only take medicine as told by your doctor. Do not take aspirin.  Use crutches as told by your doctor. Do not put weight on the joint until told to by your doctor. GET HELP RIGHT AWAY IF:   You have bruising, puffiness (swelling), or more pain.  Your fingers or toes turn blue or start to lose feeling (numb).  Your medicine does not lessen the pain.  Your pain becomes severe.  You have a temperature by mouth above 102 F (38.9 C), not controlled by medicine.  You cannot move or use the joint. MAKE SURE YOU:   Understand these instructions.  Will watch your condition.  Will get help right away if you are not doing well or get worse. Document Released: 12/20/2008 Document Revised: 03/26/2011 Document Reviewed: 12/20/2008 [employer] Patient Information 2015 [employer]. This information is not intended to replace advice given to you by your health care provider. Make sure you discuss any questions you have with your health care provider.  

## 2013-08-17 NOTE — Progress Notes (Signed)
Subjective:    Patient ID: Wendy Valencia, female    DOB: 1965-11-10, 48 y.o.   MRN: [id number]  HPI Comments: 48 yo pleasant AAF CPE and presents for F/U for HTN,  Pre-Dm, D. Deficient. She is trying to eat healthier since gastric bypass but notes weight loss has hit plateau. She is trying to increase exercise but notes difficulty with keeping QD with heat. She notes BP good.  She has chronic arthralgias/ myalgias. She notes pain is in shoulders to mid back, most of her joints and especially thighs. She denies injury/ strain. She has had NEG autoimmune workup in past. She has tried Lyrica without relief with pain. She notes new generic Tramadol does not seem to be addressing pain as well as branded. She is having to take Aleve in the middle of day to get thru work schedule.   Generic Xanax is not helping with sleep as much either. She notes she is getting 3-5 hours per night. She notes she is feeling more fatigued and notes she feels more like perimenopause. She does not have routine cycles due to depo provera shots. She did have NEG OBGYN evaluation early this year for pain with intercourse.   Last Labs-T 105 a1C 6.0  VIT d 27     Medication List       This list is accurate as of: 08/17/13 11:59 PM.  Always use your most recent med list.               ALPRAZolam 1 MG tablet  Commonly known as:  XANAX  TAKE ONE & ONE-HALF TABLETS BY MOUTH AT BEDTIME AS NEEDED     augmented betamethasone dipropionate 0.05 % ointment  Commonly known as:  DIPROLENE-AF  Apply 1 application topically as needed. Rash     cyclobenzaprine 10 MG tablet  Commonly known as:  FLEXERIL  Take 1 tablet (10 mg total) by mouth 2 (two) times daily as needed for muscle spasms.     EXCEDRIN EXTRA STRENGTH PO  Take by mouth daily as needed.     lisinopril-hydrochlorothiazide 20-12.5 MG per tablet  Commonly known as:  PRINZIDE,ZESTORETIC  Take 1 tablet by mouth daily.     medroxyPROGESTERone 150 MG/ML  injection  Commonly known as:  DEPO-PROVERA  Inject 150 mg into the muscle every 3 (three) months.     metoCLOPramide 10 MG tablet  Commonly known as:  REGLAN  TAKE ONE TABLET BY MOUTH ONCE DAILY     multivitamin with minerals tablet  Take 1 tablet by mouth daily. Bariatric multi vitamin.     traMADol 50 MG tablet  Commonly known as:  ULTRAM  TAKE TWO TABLETS BY MOUTH TWICE DAILY     Vitamin D (Ergocalciferol) 50000 UNITS Caps capsule  Commonly known as:  DRISDOL  Take 1 capsule (50,000 Units total) by mouth every 7 (seven) days.      No Known Allergies  Past Medical History  Diagnosis Date  . Hypertension   . IBS (irritable bowel syndrome)   . Arthritis   . Fibromyalgia   . Nephrolithiasis   . Anemia   . Plantar fasciitis of right foot     wears boot    Past Surgical History  Procedure Laterality Date  . Left shoulder arthroscopic surgery  yrs ago  . Tummy tuck  2005  . Breath tek h pylori  07/11/2011    Procedure: BREATH TEK H PYLORI;  Surgeon: Mariella Saa, MD;  Location: [facility];  Service: General;  Laterality: N/A;  . Gastric roux-en-y  10/01/2011    Procedure: LAPAROSCOPIC ROUX-EN-Y GASTRIC;  Surgeon: Mariella Saa, MD;  Location: [facility];  Service: General;  Laterality: N/A;  . Cholecystectomy  10/01/2011    Procedure: LAPAROSCOPIC CHOLECYSTECTOMY WITH INTRAOPERATIVE CHOLANGIOGRAM;  Surgeon: Mariella Saa, MD;  Location: [facility];  Service: General;  Laterality: N/A;   History  Substance Use Topics  . Smoking status: Current Every Day Smoker -- 0.25 packs/day for 7 years    Types: Cigarettes  . Smokeless tobacco: Never Used  . Alcohol Use: 0.6 oz/week    1 Glasses of wine per week     Comment: 1x week   Family History  Problem Relation Age of Onset  . Heart attack Maternal Grandfather   . Heart disease Maternal Grandfather   . Heart attack Paternal Grandfather   . Breast cancer Mother 48    breast  . Cancer Mother 53    breast/  ovarian  . Obesity Brother   . Breast cancer Maternal Aunt 1    died at age 64  . Cancer Maternal Aunt     breast  . Multiple sclerosis Maternal Uncle   . Lung cancer Maternal Grandmother   . Cancer Maternal Grandmother     ovarian  . Heart disease Father   . Hypertension Father   . Stroke Father      MAINTENANCE: Colonoscopy:07/12/11 [facility] Mammo:12/2011- overdue BMD:07/16/11 borderline osteoporosis Pap/ Pelvic:01/14/12 EYE:08/2013 Dentist:q 6 month  IMMUNIZATIONS: Tdap:2008 Pneumovax:2013 Zostavax:n/a Influenza:  Patient Care Team: Lucky Cowboy, MD as PCP - General (Internal Medicine) Orvil Feil Himmelrich, RD as Dietitian (Bariatrics) Meryl Dare, MD as Consulting Physician (Gastroenterology) Edward Jolly, (DENTIST) [facility] [facility], (OBGYN) Stile, (EYE)   Review of Systems  Constitutional: Positive for fatigue.  Musculoskeletal: Positive for arthralgias and myalgias.  Psychiatric/Behavioral: Positive for sleep disturbance.  All other systems reviewed and are negative.  BP 122/80  Pulse 76  Temp(Src) 97.9 F (36.6 C)  Resp 18  Ht 5' 7.75" (1.721 m)  Wt 187 lb 12.8 oz (85.186 kg)  BMI 28.76 kg/m2     Objective:   Physical Exam  Nursing note and vitals reviewed. Constitutional: She is oriented to person, place, and time. She appears well-developed and well-nourished. No distress.  HENT:  Head: Normocephalic and atraumatic.  Right Ear: External ear normal.  Left Ear: External ear normal.  Nose: Nose normal.  Mouth/Throat: Oropharynx is clear and moist.  Eyes: Conjunctivae and EOM are normal. Pupils are equal, round, and reactive to light. Right eye exhibits no discharge. Left eye exhibits no discharge. No scleral icterus.  Neck: Normal range of motion. Neck supple. No JVD present. No tracheal deviation present. No thyromegaly present.  Cardiovascular: Normal rate, regular rhythm, normal heart sounds and intact distal pulses.    Pulmonary/Chest: Effort normal and breath sounds normal.  Abdominal: Soft. Bowel sounds are normal. She exhibits no distension and no mass. There is no tenderness. There is no rebound and no guarding.  Genitourinary:  Ref gyn  Musculoskeletal: Normal range of motion. She exhibits no edema and no tenderness.  Mild point tenderness in thighs upper back  Lymphadenopathy:    She has no cervical adenopathy.  Neurological: She is alert and oriented to person, place, and time. She has normal reflexes. No cranial nerve deficit. She exhibits normal muscle tone. Coordination normal.  Skin: Skin is warm and dry. No rash noted. No erythema. No pallor.  Psychiatric:  She has a normal mood and affect. Her behavior is normal. Judgment and thought content normal.    [facility]- ADVISED if any CV complaints f/u for evaluation     Assessment & Plan:  1. CPE- Update screening labs/ History/ Immunizations/ Testing as needed. Advised healthy diet, QD exercise, increase H20 and continue RX/ Vitamins AD.  2. Chronic arthralgias/ myalgias/ fatigue- Ref Rheum for further evaluation- Patient notes generic Tramadol and Xanax do not work as well for her pain/ sleep control. Advised to check with insurance for coverage cost and when renewal due we can try to switch to branded for both products, advised we will NOT write for increased dosing higher than already prescribed, she will need further evaluation at [facility] or pain management. Sleep hygiene discussed, may need referral for sleep study  3. F/U for HTN,  Pre-Dm, D. Deficient. Needs healthy diet, cardio QD and obtain healthy weight. Check Labs, Check BP if >130/80 call office.   4. Fatigue- check labs, increase activity and H2O, if rheum workup neg consider sleep study vs cardiac referral  OVER 40 minutes of exam, counseling, chart review, referral performed

## 2013-08-18 LAB — HEMOGLOBIN A1C
HEMOGLOBIN A1C: 5.6 % (ref <5.7)
Mean Plasma Glucose: 114 mg/dL (ref <117)

## 2013-08-18 LAB — URINALYSIS, ROUTINE W REFLEX MICROSCOPIC
Bilirubin Urine: NEGATIVE
GLUCOSE, UA: NEGATIVE mg/dL
HGB URINE DIPSTICK: NEGATIVE
KETONES UR: NEGATIVE mg/dL
Leukocytes, UA: NEGATIVE
Nitrite: NEGATIVE
PH: 7 (ref 5.0–8.0)
Protein, ur: NEGATIVE mg/dL
Specific Gravity, Urine: 1.026 (ref 1.005–1.030)
Urobilinogen, UA: 0.2 mg/dL (ref 0.0–1.0)

## 2013-08-18 LAB — MICROALBUMIN / CREATININE URINE RATIO
Creatinine, Urine: 181.3 mg/dL
MICROALB/CREAT RATIO: 3.6 mg/g (ref 0.0–30.0)
Microalb, Ur: 0.66 mg/dL (ref 0.00–1.89)

## 2013-08-18 LAB — BASIC METABOLIC PANEL WITH GFR
BUN: 15 mg/dL (ref 6–23)
CHLORIDE: 104 meq/L (ref 96–112)
CO2: 23 meq/L (ref 19–32)
Calcium: 9.5 mg/dL (ref 8.4–10.5)
Creat: 0.7 mg/dL (ref 0.50–1.10)
GFR, Est African American: >89 mL/min
GFR, Est Non African American: >89 mL/min
Glucose, Bld: 97 mg/dL (ref 70–99)
POTASSIUM: 3.5 meq/L (ref 3.5–5.3)
SODIUM: 137 meq/L (ref 135–145)

## 2013-08-18 LAB — LIPID PANEL
Cholesterol: 98 mg/dL (ref 0–200)
HDL: 46 mg/dL (ref >39)
LDL Cholesterol: 42 mg/dL (ref 0–99)
Total CHOL/HDL Ratio: 2.1 Ratio
Triglycerides: 50 mg/dL (ref <150)
VLDL: 10 mg/dL (ref 0–40)

## 2013-08-18 LAB — HEPATIC FUNCTION PANEL
ALBUMIN: 4.3 g/dL (ref 3.5–5.2)
ALK PHOS: 131 U/L — AB (ref 39–117)
ALT: 17 U/L (ref 0–35)
AST: 18 U/L (ref 0–37)
Bilirubin, Direct: 0.2 mg/dL (ref 0.0–0.3)
Indirect Bilirubin: 0.4 mg/dL (ref 0.2–1.2)
TOTAL PROTEIN: 7 g/dL (ref 6.0–8.3)
Total Bilirubin: 0.6 mg/dL (ref 0.2–1.2)

## 2013-08-18 LAB — VITAMIN D 25 HYDROXY (VIT D DEFICIENCY, FRACTURES): Vit D, 25-Hydroxy: 36 ng/mL (ref 30–89)

## 2013-08-18 LAB — IRON AND TIBC
%SAT: 33 % (ref 20–55)
IRON: 110 ug/dL (ref 42–145)
TIBC: 334 ug/dL (ref 250–470)
UIBC: 224 ug/dL (ref 125–400)

## 2013-08-18 LAB — MAGNESIUM: MAGNESIUM: 2 mg/dL (ref 1.5–2.5)

## 2013-08-18 LAB — VITAMIN B12: VITAMIN B 12: 668 pg/mL (ref 211–911)

## 2013-08-18 LAB — FOLATE RBC: RBC Folate: 493 ng/mL (ref >280)

## 2013-08-18 LAB — TSH: TSH: 1.051 u[IU]/mL (ref 0.350–4.500)

## 2013-08-18 LAB — INSULIN, FASTING: INSULIN FASTING, SERUM: 16 u[IU]/mL (ref 3–28)

## 2013-08-19 ENCOUNTER — Other Ambulatory Visit: Payer: Self-pay | Admitting: Emergency Medicine

## 2013-08-19 ENCOUNTER — Other Ambulatory Visit: Payer: Self-pay

## 2013-08-19 DIAGNOSIS — E559 Vitamin D deficiency, unspecified: Secondary | ICD-10-CM

## 2013-08-19 DIAGNOSIS — R6889 Other general symptoms and signs: Secondary | ICD-10-CM

## 2013-08-19 MED ORDER — VITAMIN D (ERGOCALCIFEROL) 1.25 MG (50000 UNIT) PO CAPS: 50000.0000 [IU] | ORAL_CAPSULE | ORAL | Status: DC

## 2013-08-20 ENCOUNTER — Encounter: Payer: Self-pay | Admitting: Emergency Medicine

## 2013-08-25 ENCOUNTER — Ambulatory Visit
Admission: RE | Admit: 2013-08-25 | Discharge: 2013-08-25 | Disposition: A | Discharge status: Home / Self Care | Payer: BC Managed Care – PPO | Source: Ambulatory Visit | Attending: Emergency Medicine | Admitting: Emergency Medicine

## 2013-08-25 DIAGNOSIS — R6889 Other general symptoms and signs: Secondary | ICD-10-CM

## 2013-09-04 ENCOUNTER — Encounter (INDEPENDENT_AMBULATORY_CARE_PROVIDER_SITE_OTHER): Payer: Self-pay

## 2013-09-04 ENCOUNTER — Other Ambulatory Visit: Payer: Self-pay | Admitting: Physician Assistant

## 2013-09-04 MED ORDER — TRAMADOL HCL 50 MG PO TABS: ORAL_TABLET | ORAL | Status: DC

## 2013-09-04 MED ORDER — ALPRAZOLAM 1 MG PO TABS: ORAL_TABLET | ORAL | Status: DC

## 2013-09-05 ENCOUNTER — Other Ambulatory Visit: Payer: Self-pay | Admitting: Emergency Medicine

## 2013-09-30 ENCOUNTER — Ambulatory Visit (HOSPITAL_COMMUNITY)
Admission: RE | Admit: 2013-09-30 | Discharge: 2013-09-30 | Disposition: A | Discharge status: Home / Self Care | Payer: BC Managed Care – PPO | Source: Ambulatory Visit | Attending: Emergency Medicine | Admitting: Emergency Medicine

## 2013-09-30 ENCOUNTER — Other Ambulatory Visit: Payer: Self-pay | Admitting: Emergency Medicine

## 2013-09-30 DIAGNOSIS — Z3042 Encounter for surveillance of injectable contraceptive: Secondary | ICD-10-CM

## 2013-09-30 DIAGNOSIS — Z780 Asymptomatic menopausal state: Secondary | ICD-10-CM

## 2013-09-30 DIAGNOSIS — Z1231 Encounter for screening mammogram for malignant neoplasm of breast: Secondary | ICD-10-CM | POA: Insufficient documentation

## 2013-10-04 ENCOUNTER — Other Ambulatory Visit: Payer: Self-pay | Admitting: Internal Medicine

## 2013-10-05 ENCOUNTER — Other Ambulatory Visit: Payer: Self-pay | Admitting: Physician Assistant

## 2013-10-16 ENCOUNTER — Ambulatory Visit (INDEPENDENT_AMBULATORY_CARE_PROVIDER_SITE_OTHER): Payer: BC Managed Care – PPO | Admitting: *Deleted

## 2013-10-16 DIAGNOSIS — Z23 Encounter for immunization: Secondary | ICD-10-CM

## 2013-10-16 DIAGNOSIS — Z3042 Encounter for surveillance of injectable contraceptive: Secondary | ICD-10-CM

## 2013-10-16 MED ORDER — MEDROXYPROGESTERONE ACETATE 150 MG/ML IM SUSP
150.0000 mg | Freq: Once | INTRAMUSCULAR | Status: AC
  Administered 2013-10-16: 150 mg via INTRAMUSCULAR

## 2013-11-02 ENCOUNTER — Other Ambulatory Visit: Payer: Self-pay | Admitting: Physician Assistant

## 2013-11-02 ENCOUNTER — Other Ambulatory Visit: Payer: Self-pay | Admitting: Internal Medicine

## 2013-11-16 ENCOUNTER — Encounter: Payer: Self-pay | Admitting: *Deleted

## 2013-11-16 ENCOUNTER — Encounter: Payer: Self-pay | Admitting: Emergency Medicine

## 2013-12-04 ENCOUNTER — Other Ambulatory Visit: Payer: Self-pay | Admitting: Physician Assistant

## 2014-01-01 ENCOUNTER — Ambulatory Visit: Payer: Self-pay

## 2014-01-01 ENCOUNTER — Ambulatory Visit (INDEPENDENT_AMBULATORY_CARE_PROVIDER_SITE_OTHER): Payer: BC Managed Care – PPO | Admitting: Physician Assistant

## 2014-01-01 VITALS — BP 120/78 | HR 80 | Temp 97.7°F | Resp 16 | Ht 67.0 in | Wt 196.0 lb

## 2014-01-01 DIAGNOSIS — Z9884 Bariatric surgery status: Secondary | ICD-10-CM

## 2014-01-01 DIAGNOSIS — N898 Other specified noninflammatory disorders of vagina: Secondary | ICD-10-CM

## 2014-01-01 DIAGNOSIS — Z3042 Encounter for surveillance of injectable contraceptive: Secondary | ICD-10-CM

## 2014-01-01 DIAGNOSIS — I10 Essential (primary) hypertension: Secondary | ICD-10-CM

## 2014-01-01 DIAGNOSIS — F411 Generalized anxiety disorder: Secondary | ICD-10-CM

## 2014-01-01 DIAGNOSIS — E538 Deficiency of other specified B group vitamins: Secondary | ICD-10-CM

## 2014-01-01 DIAGNOSIS — R7989 Other specified abnormal findings of blood chemistry: Secondary | ICD-10-CM

## 2014-01-01 DIAGNOSIS — R7309 Other abnormal glucose: Secondary | ICD-10-CM

## 2014-01-01 DIAGNOSIS — G4700 Insomnia, unspecified: Secondary | ICD-10-CM

## 2014-01-01 DIAGNOSIS — E559 Vitamin D deficiency, unspecified: Secondary | ICD-10-CM

## 2014-01-01 DIAGNOSIS — Z79899 Other long term (current) drug therapy: Secondary | ICD-10-CM

## 2014-01-01 DIAGNOSIS — R7303 Prediabetes: Secondary | ICD-10-CM

## 2014-01-01 DIAGNOSIS — M2550 Pain in unspecified joint: Secondary | ICD-10-CM

## 2014-01-01 DIAGNOSIS — R945 Abnormal results of liver function studies: Secondary | ICD-10-CM

## 2014-01-01 LAB — BASIC METABOLIC PANEL WITH GFR
BUN: 13 mg/dL (ref 6–23)
CALCIUM: 9.4 mg/dL (ref 8.4–10.5)
CHLORIDE: 105 meq/L (ref 96–112)
CO2: 25 mEq/L (ref 19–32)
Creat: 0.73 mg/dL (ref 0.50–1.10)
GFR, Est African American: >89 mL/min
GFR, Est Non African American: >89 mL/min
GLUCOSE: 88 mg/dL (ref 70–99)
POTASSIUM: 3.9 meq/L (ref 3.5–5.3)
SODIUM: 140 meq/L (ref 135–145)

## 2014-01-01 LAB — CBC WITH DIFFERENTIAL/PLATELET
Basophils Absolute: 0 10*3/uL (ref 0.0–0.1)
Basophils Relative: 0 % (ref 0–1)
Eosinophils Absolute: 0.1 10*3/uL (ref 0.0–0.7)
Eosinophils Relative: 1 % (ref 0–5)
HEMATOCRIT: 43.7 % (ref 36.0–46.0)
HEMOGLOBIN: 14.6 g/dL (ref 12.0–15.0)
LYMPHS ABS: 2.2 10*3/uL (ref 0.7–4.0)
Lymphocytes Relative: 42 % (ref 12–46)
MCH: 30.4 pg (ref 26.0–34.0)
MCHC: 33.4 g/dL (ref 30.0–36.0)
MCV: 91 fL (ref 78.0–100.0)
MONOS PCT: 8 % (ref 3–12)
MPV: 9.5 fL (ref 9.4–12.4)
Monocytes Absolute: 0.4 10*3/uL (ref 0.1–1.0)
NEUTROS ABS: 2.5 10*3/uL (ref 1.7–7.7)
Neutrophils Relative %: 49 % (ref 43–77)
Platelets: 350 10*3/uL (ref 150–400)
RBC: 4.8 MIL/uL (ref 3.87–5.11)
RDW: 13.1 % (ref 11.5–15.5)
WBC: 5.2 10*3/uL (ref 4.0–10.5)

## 2014-01-01 LAB — HEPATIC FUNCTION PANEL
ALK PHOS: 164 U/L — AB (ref 39–117)
ALT: 12 U/L (ref 0–35)
AST: 13 U/L (ref 0–37)
Albumin: 4.3 g/dL (ref 3.5–5.2)
BILIRUBIN DIRECT: 0.2 mg/dL (ref 0.0–0.3)
BILIRUBIN TOTAL: 0.6 mg/dL (ref 0.2–1.2)
Indirect Bilirubin: 0.4 mg/dL (ref 0.2–1.2)
Total Protein: 7.1 g/dL (ref 6.0–8.3)

## 2014-01-01 LAB — VITAMIN B12: Vitamin B-12: 492 pg/mL (ref 211–911)

## 2014-01-01 LAB — MAGNESIUM: MAGNESIUM: 2 mg/dL (ref 1.5–2.5)

## 2014-01-01 LAB — TSH: TSH: 0.813 u[IU]/mL (ref 0.350–4.500)

## 2014-01-01 MED ORDER — TRAMADOL HCL 50 MG PO TABS: ORAL_TABLET | ORAL | Status: DC

## 2014-01-01 MED ORDER — MEDROXYPROGESTERONE ACETATE 150 MG/ML IM SUSP
150.0000 mg | Freq: Once | INTRAMUSCULAR | Status: AC
  Administered 2014-01-01: 150 mg via INTRAMUSCULAR

## 2014-01-01 MED ORDER — CLONAZEPAM 1 MG PO TABS: ORAL_TABLET | ORAL | Status: DC

## 2014-01-01 NOTE — Patient Instructions (Addendum)
VAGINAL DRYNESS OVERVIEW  Vaginal dryness, also known as atrophic vaginitis, is a common condition in postmenopausal women. This condition is also common in women who have had both ovaries removed at the time of hysterectomy.   Some women have uncomfortable symptoms of vaginal dryness, such as pain with sex, burning vaginal discomfort or itching, or abnormal vaginal discharge, while others have no symptoms at all.  VAGINAL DRYNESS CAUSES   Estrogen helps to keep the vagina moist and to maintain thickness of the vaginal lining. Vaginal dryness occurs when the ovaries produce a decreased amount of estrogen. This can occur at certain times in a woman's life, and may be permanent or temporary. Times when less estrogen is made include: ?At the time of menopause. ?After surgical removal of the ovaries, chemotherapy, or radiation therapy of the pelvis for cancer. ?After having a baby, particularly in women who breastfeed. ?While using certain medications, such as danazol, medroxyprogesterone (brand names: Provera or DepoProvera), leuprolide (brand name: Lupron), or nafarelin. When these medications are stopped, estrogen production resumes.  Women who smoke cigarettes have been shown to have an increased risk of an earlier menopause transition as compared to non-smokers. Therefore, atrophic vaginitis symptoms may appear at a younger age in this population.  VAGINAL DRYNESS TREATMENT   There are three treatment options for women with vaginal dryness:   Vaginal lubricants and moisturizers - Vaginal lubricants and moisturizers can be purchased without a prescription. These products do not contain any hormones and have virtually no side effects.  - Albolene is found in the facial cleanser section at [facility], [facility], or [employer]. It is a large jar with a blue top. This is the best lubricant for women because it is hypoallergenic. -Natural lubricants, such as olive, avocado or peanut oil, are easily available  products that may be used as a lubricant with sex.  -Vaginal moisturizes (eg, Replens, Moist Again, Vagisil, K-Y Silk-E, and Feminease) are formulated to allow water to be retained in the vaginal tissues. Moisturizers are applied into the vagina three times weekly to allow a continued moisturizing effect. These should not be used just before having sex, as they can be irritating.  Vaginal estrogen - Vaginal estrogen is the most effective treatment option for women with vaginal dryness. Vaginal estrogen must be prescribed by a healthcare provider. Very low doses of vaginal estrogen can be used when it is put into the vagina to treat vaginal dryness. A small amount of estrogen is absorbed into the bloodstream, but only about 100 times less than when using estrogen pills or tablets. As a result, there is a much lower risk of side effects, such as blood clots, breast cancer, and heart attack, compared with other estrogen-containing products (birth control pills, menopausal hormone therapy).   Ospemifene - Ospemifene is a prescription medication that is similar to estrogen, but is not estrogen. In the vaginal tissue, it acts similarly to estrogen. In the breast tissue, it acts as an estrogen blocker. It comes in a pill, and is prescribed for women who want to use an estrogen-like medication for vaginal dryness or painful sex associated with vaginal dryness, but prefer not to use a vaginal medication. The medication may cause hot flashes as a side effect. This type of medication may increase the risk of blood clots or uterine cancer. Further study of ospemifene is needed to evaluate the risk of these complications. This medication has not been tested in women who have had breast cancer or are at a high risk  of developing breast cancer.    Sexual activity - Vaginal estrogen improves vaginal dryness quickly, usually within a few weeks. You may continue to have sex as you treat vaginal dryness because sex itself  can help to keep the vaginal tissues healthy. Vaginal intercourse may help the vaginal tissues by keeping them soft and stretchable and preventing the tissues from shrinking.  If sex continues to be painful despite treatment for vaginal dryness, talk to your healthcare provider.   Insomnia Insomnia is frequent trouble falling and/or staying asleep. Insomnia can be a long term problem or a short term problem. Both are common. Insomnia can be a short term problem when the wakefulness is related to a certain stress or worry. Long term insomnia is often related to ongoing stress during waking hours and/or poor sleeping habits. Overtime, sleep deprivation itself can make the problem worse. Every little thing feels more severe because you are overtired and your ability to cope is decreased. CAUSES   Stress, anxiety, and depression.  Poor sleeping habits.  Distractions such as TV in the bedroom.  Naps close to bedtime.  Engaging in emotionally charged conversations before bed.  Technical reading before sleep.  Alcohol and other sedatives. They may make the problem worse. They can hurt normal sleep patterns and normal dream activity.  Stimulants such as caffeine for several hours prior to bedtime.  Pain syndromes and shortness of breath can cause insomnia.  Exercise late at night.  Changing time zones may cause sleeping problems (jet lag). It is sometimes helpful to have someone observe your sleeping patterns. They should look for periods of not breathing during the night (sleep apnea). They should also look to see how long those periods last. If you live alone or observers are uncertain, you can also be observed at a sleep clinic where your sleep patterns will be professionally monitored. Sleep apnea requires a checkup and treatment. Give your caregivers your medical history. Give your caregivers observations your family has made about your sleep.  SYMPTOMS   Not feeling rested in the  morning.  Anxiety and restlessness at bedtime.  Difficulty falling and staying asleep. TREATMENT   Your caregiver may prescribe treatment for an underlying medical disorders. Your caregiver can give advice or help if you are using alcohol or other drugs for self-medication. Treatment of underlying problems will usually eliminate insomnia problems.  Medications can be prescribed for short time use. They are generally not recommended for lengthy use.  Over-the-counter sleep medicines are not recommended for lengthy use. They can be habit forming.  You can promote easier sleeping by making lifestyle changes such as:  Using relaxation techniques that help with breathing and reduce muscle tension.  Exercising earlier in the day.  Changing your diet and the time of your last meal. No night time snacks.  Establish a regular time to go to bed.  Counseling can help with stressful problems and worry.  Soothing music and white noise may be helpful if there are background noises you cannot remove.  Stop tedious detailed work at least one hour before bedtime. HOME CARE INSTRUCTIONS   Keep a diary. Inform your caregiver about your progress. This includes any medication side effects. See your caregiver regularly. Take note of:  Times when you are asleep.  Times when you are awake during the night.  The quality of your sleep.  How you feel the next day. This information will help your caregiver care for you.  Get out of bed if you  are still awake after 15 minutes. Read or do some quiet activity. Keep the lights down. Wait until you feel sleepy and go back to bed.  Keep regular sleeping and waking hours. Avoid naps.  Exercise regularly.  Avoid distractions at bedtime. Distractions include watching television or engaging in any intense or detailed activity like attempting to balance the household checkbook.  Develop a bedtime ritual. Keep a familiar routine of bathing, brushing your  teeth, climbing into bed at the same time each night, listening to soothing music. Routines increase the success of falling to sleep faster.  Use relaxation techniques. This can be using breathing and muscle tension release routines. It can also include visualizing peaceful scenes. You can also help control troubling or intruding thoughts by keeping your mind occupied with boring or repetitive thoughts like the old concept of counting sheep. You can make it more creative like imagining planting one beautiful flower after another in your backyard garden.  During your day, work to eliminate stress. When this is not possible use some of the previous suggestions to help reduce the anxiety that accompanies stressful situations. MAKE SURE YOU:   Understand these instructions.  Will watch your condition.  Will get help right away if you are not doing well or get worse. Document Released: 12/30/1999 Document Revised: 03/26/2011 Document Reviewed: 01/29/2007 [facility] Patient Information 2015 [place], [place]. This information is not intended to replace advice given to you by your health care provider. Make sure you discuss any questions you have with your health care provider.

## 2014-01-01 NOTE — Progress Notes (Signed)
Assessment and Plan:  Hypertension: Continue medication, monitor blood pressure at home. Continue DASH diet.  Reminder to go to the ER if any CP, SOB, nausea, dizziness, severe HA, changes vision/speech, left arm numbness and tingling, and jaw pain. Cholesterol: Continue diet and exercise. Check cholesterol.  Pre-diabetes-Continue diet and exercise. Check A1C Vitamin D Def- check level and continue medications.  Polyarthralgia- neg autoimmune work up will refer rhem to rule out autoimmune, cont meds, declines pain management referral. Refilled Tramadol, understands can not take more then 4 a day and we will not write for more or write for narcotic.  Insomnia- stop xanax will try klonopin for possible nerve pain/RLS- good sleep hygiene discussed, increase day time activity, try melatonin or benadryl if this does not help we will call in sleep medication.  Patient is s/p gastric bypass- Check labs to do to screen for vitamin deficiencies associated with gastric bypass including Vitamin D, B12.  Recommend strict diet and exercise.    Continue diet and meds as discussed. Further disposition pending results of labs. OVER 40 minutes of exam, counseling, chart review, referral performed   HPI 48 y.o. female  presents for 3 month follow up with hypertension, hyperlipidemia, prediabetes and vitamin D. Her blood pressure has been controlled at home, she is on lisinopril/HCTZ 20/12.5, today their BP is BP: 120/78 mmHg She does workout. She denies chest pain, shortness of breath, dizziness.  She is not on cholesterol medication and denies myalgias. Her cholesterol is at goal. The cholesterol last visit was:   Lab Results  Component Value Date   CHOL 98 08/17/2013   HDL 46 08/17/2013   LDLCALC 42 08/17/2013   TRIG 50 08/17/2013   CHOLHDL 2.1 08/17/2013    Since her gastric bypass her A1C has gotten better but she admits to eating poorly during the holidays and would like to check her A1C, she will  restart diet in Jan. Last A1C in the office was:  Lab Results  Component Value Date   HGBA1C 5.6 08/17/2013   Patient is on Vitamin D supplement.   Lab Results  Component Value Date   VD25OH 36 08/17/2013     BMI is Body mass index is 30.69 kg/(m^2)., she is working on diet and exercise, she is also s/p gastric bypass. Wt Readings from Last 3 Encounters:  01/01/14 196 lb (88.905 kg)  08/17/13 187 lb 12.8 oz (85.186 kg)  04/07/13 190 lb (86.183 kg)   Has arthralgias/myalgias, neg autoimmune work up in the past, is on tramadol and aleve for pain but states that it is not helping with the pain. She is on 4 total of the tramadol a day and breaks it up to try to make it last all day. She has a history of pain in shoulders/mid back, and the majority of her joints.  She has decreased sleep. She takes 1.5 xanax but only sleep for 4-5 hours.  She has had recent work up with her OB/GYN and she is perimenopausal, she is having worsening vaginal dryness due to this, negative workup from OB/GYN. She will continue Depo until completely menopausal, will monitor FSH.  She had normal AB US for elevated LFTs.  Current Medications:  Current Outpatient Prescriptions on File Prior to Visit  Medication Sig Dispense Refill  . ALPRAZolam (XANAX) 1 MG tablet TAKE 1&1/2 TABLETS AT BEDTIME AS NEEDED. 45 tablet 0  . Aspirin-Acetaminophen-Caffeine (EXCEDRIN EXTRA STRENGTH PO) Take by mouth daily as needed.    Marland Kitchen. augmented betamethasone dipropionate (  DIPROLENE-AF) 0.05 % ointment Apply 1 application topically as needed. Rash    . cyclobenzaprine (FLEXERIL) 10 MG tablet Take 1 tablet (10 mg total) by mouth 2 (two) times daily as needed for muscle spasms. 10 tablet 0  . lisinopril-hydrochlorothiazide (PRINZIDE,ZESTORETIC) 20-12.5 MG per tablet TAKE 1 TABLET BY MOUTH DAILY. 90 tablet 0  . medroxyPROGESTERone (DEPO-PROVERA) 150 MG/ML injection Inject 150 mg into the muscle every 3 (three) months.     . metoCLOPramide  (REGLAN) 10 MG tablet TAKE ONE TABLET BY MOUTH ONCE DAILY 90 tablet 0  . Multiple Vitamins-Minerals (MULTIVITAMIN WITH MINERALS) tablet Take 1 tablet by mouth daily. Bariatric multi vitamin.    . traMADol (ULTRAM) 50 MG tablet TAKE (2) TABLETS TWICE DAILY. 120 tablet 0  . Vitamin D, Ergocalciferol, (DRISDOL) 50000 UNITS CAPS capsule Take 1 capsule (50,000 Units total) by mouth 2 (two) times a week. 30 capsule 1   No current facility-administered medications on file prior to visit.   Medical History:  Past Medical History  Diagnosis Date  . Hypertension   . IBS (irritable bowel syndrome)   . Arthritis   . Fibromyalgia   . Nephrolithiasis   . Anemia   . Plantar fasciitis of right foot     wears boot    Allergies: No Known Allergies   Review of Systems:  Review of Systems  Constitutional: Positive for malaise/fatigue. Negative for fever, chills, weight loss and diaphoresis.  HENT: Negative for congestion, ear discharge, ear pain, hearing loss, nosebleeds, sore throat and tinnitus.   Eyes: Negative.   Respiratory: Negative.  Negative for cough, shortness of breath and stridor.   Cardiovascular: Negative.  Negative for chest pain.  Gastrointestinal: Negative.   Genitourinary: Negative.        + vaginal dryness, painful intercourse  Musculoskeletal: Positive for myalgias, back pain, joint pain and neck pain. Negative for falls.  Skin: Negative.   Neurological: Positive for headaches (better with weight loss and recent new glasses). Negative for dizziness, tingling, tremors, sensory change, speech change, focal weakness, seizures, loss of consciousness and weakness.  Endo/Heme/Allergies: Negative for environmental allergies and polydipsia. Does not bruise/bleed easily.  Psychiatric/Behavioral: Negative for depression, suicidal ideas, hallucinations, memory loss and substance abuse. The patient is nervous/anxious and has insomnia.     Family history- Review and unchanged Social  history- Review and unchanged Physical Exam: BP 120/78 mmHg  Pulse 80  Temp(Src) 97.7 F (36.5 C)  Resp 16  Ht 5\' 7"  (1.702 m)  Wt 196 lb (88.905 kg)  BMI 30.69 kg/m2 Wt Readings from Last 3 Encounters:  01/01/14 196 lb (88.905 kg)  08/17/13 187 lb 12.8 oz (85.186 kg)  04/07/13 190 lb (86.183 kg)   General Appearance: Well nourished, in no apparent distress. Eyes: PERRLA, EOMs, conjunctiva no swelling or erythema Sinuses: No Frontal/maxillary tenderness ENT/Mouth: Ext aud canals clear, TMs without erythema, bulging. No erythema, swelling, or exudate on post pharynx.  Tonsils not swollen or erythematous. Hearing normal.  Neck: Supple, thyroid normal.  Respiratory: Respiratory effort normal, BS equal bilaterally without rales, rhonchi, wheezing or stridor.  Cardio: RRR with no MRGs. Brisk peripheral pulses without edema.  Abdomen: Soft, + BS.  Non tender, no guarding, rebound, hernias, masses. Lymphatics: Non tender without lymphadenopathy.  Musculoskeletal: Full ROM, 5/5 strength, normal gait. Diffuse tenderness back, legs.  Skin: Warm, dry without rashes, lesions, ecchymosis.  Neuro: Cranial nerves intact. Normal muscle tone, no cerebellar symptoms.  Psych: Awake and oriented X 3, normal affect, Insight and Judgment  appropriate.    Quentin Mulling, PA-C 10:45 AM [facility]

## 2014-01-02 LAB — VITAMIN D 25 HYDROXY (VIT D DEFICIENCY, FRACTURES): Vit D, 25-Hydroxy: 26 ng/mL — ABNORMAL LOW (ref 30–100)

## 2014-01-02 LAB — HEMOGLOBIN A1C
Hgb A1c MFr Bld: 5.8 % — ABNORMAL HIGH (ref <5.7)
Mean Plasma Glucose: 120 mg/dL — ABNORMAL HIGH (ref <117)

## 2014-01-04 ENCOUNTER — Encounter: Payer: Self-pay | Admitting: Physician Assistant

## 2014-01-04 ENCOUNTER — Other Ambulatory Visit: Payer: Self-pay | Admitting: Physician Assistant

## 2014-01-04 MED ORDER — LORAZEPAM 1 MG PO TABS: ORAL_TABLET | ORAL | Status: DC

## 2014-01-04 NOTE — Progress Notes (Unsigned)
Patient tried klonopin and states it does not help with sleep. Would like to try something else other than xanax. Will try long acting benzo, lorazepam 1mg  1/2-1, will call if it does not help.

## 2014-01-05 ENCOUNTER — Other Ambulatory Visit: Payer: Self-pay

## 2014-01-05 MED ORDER — LORAZEPAM 1 MG PO TABS: ORAL_TABLET | ORAL | Status: DC

## 2014-01-12 ENCOUNTER — Encounter: Payer: Self-pay | Admitting: Physician Assistant

## 2014-02-02 ENCOUNTER — Encounter: Payer: Self-pay | Admitting: Physician Assistant

## 2014-02-02 ENCOUNTER — Other Ambulatory Visit: Payer: Self-pay | Admitting: Physician Assistant

## 2014-02-02 ENCOUNTER — Telehealth: Payer: BLUE CROSS/BLUE SHIELD | Admitting: Physician Assistant

## 2014-02-02 DIAGNOSIS — J0100 Acute maxillary sinusitis, unspecified: Secondary | ICD-10-CM

## 2014-02-02 MED ORDER — AZITHROMYCIN 250 MG PO TABS: ORAL_TABLET | ORAL | Status: DC

## 2014-02-02 MED ORDER — PROMETHAZINE-CODEINE 6.25-10 MG/5ML PO SYRP: 5.0000 mL | ORAL_SOLUTION | Freq: Four times a day (QID) | ORAL | Status: DC | PRN

## 2014-02-02 MED ORDER — PREDNISONE 20 MG PO TABS: ORAL_TABLET | ORAL | Status: DC

## 2014-02-02 NOTE — Progress Notes (Signed)
EVISIT TELEPHONE Patient called the office for a telephone visit complaining of symptoms of a URI. Symptoms include congestion, fever chills, headache described as throbbing, nasal congestion, non productive cough, post nasal drip, sinus pressure and sore throat. Onset of symptoms was 5 days ago, and has been gradually worsening since that time. Treatment to date: Mucinex. Zytec D and Nyquil Cold & Flu .  PLAN: URI- Discussed diagnosis and treatment of URI. Discussed the importance of avoiding unnecessary antibiotic therapy. Suggested symptomatic OTC remedies. Nasal saline spray for congestion. Zithromax per orders. Follow up as needed. Call in 7 days if symptoms aren't resolving. Prednisone, and OTC instructions sent via my chart

## 2014-03-03 ENCOUNTER — Other Ambulatory Visit: Payer: Self-pay | Admitting: Internal Medicine

## 2014-03-04 ENCOUNTER — Other Ambulatory Visit: Payer: Self-pay | Admitting: Physician Assistant

## 2014-03-19 ENCOUNTER — Ambulatory Visit: Payer: Self-pay

## 2014-03-19 ENCOUNTER — Ambulatory Visit (INDEPENDENT_AMBULATORY_CARE_PROVIDER_SITE_OTHER): Payer: BLUE CROSS/BLUE SHIELD | Admitting: *Deleted

## 2014-03-19 DIAGNOSIS — R7989 Other specified abnormal findings of blood chemistry: Secondary | ICD-10-CM

## 2014-03-19 DIAGNOSIS — Z3042 Encounter for surveillance of injectable contraceptive: Secondary | ICD-10-CM

## 2014-03-19 DIAGNOSIS — R748 Abnormal levels of other serum enzymes: Secondary | ICD-10-CM

## 2014-03-19 LAB — HEPATIC FUNCTION PANEL
ALBUMIN: 4.1 g/dL (ref 3.5–5.2)
ALT: 14 U/L (ref 0–35)
AST: 15 U/L (ref 0–37)
Alkaline Phosphatase: 155 U/L — ABNORMAL HIGH (ref 39–117)
BILIRUBIN TOTAL: 0.4 mg/dL (ref 0.2–1.2)
Bilirubin, Direct: 0.1 mg/dL (ref 0.0–0.3)
Indirect Bilirubin: 0.3 mg/dL (ref 0.2–1.2)
Total Protein: 6.7 g/dL (ref 6.0–8.3)

## 2014-03-19 LAB — GAMMA GT: GGT: 10 U/L (ref 7–51)

## 2014-03-19 MED ORDER — MEDROXYPROGESTERONE ACETATE 150 MG/ML IM SUSP
150.0000 mg | Freq: Once | INTRAMUSCULAR | Status: AC
Start: 2014-03-19 — End: 2014-03-19
  Administered 2014-03-19: 150 mg via INTRAMUSCULAR

## 2014-03-19 MED ORDER — MEDROXYPROGESTERONE ACETATE 150 MG/ML IM SUSP
150.0000 mg | Freq: Once | INTRAMUSCULAR | Status: AC
  Administered 2014-03-19: 150 mg via INTRAMUSCULAR

## 2014-03-19 NOTE — Progress Notes (Signed)
Patient ID: Wendy NineLisa A Valencia, female   DOB: 1965/12/27, 49 y.o.   MRN: [id number] Patient presents for DepoProvera shot.  Patient received 150 mg right glut, tolerated well.  Also will have HFP and GGT drawn today per Quentin MullingAmanda Collier, PA-C orders.

## 2014-03-27 ENCOUNTER — Other Ambulatory Visit: Payer: Self-pay | Admitting: Emergency Medicine

## 2014-04-02 ENCOUNTER — Other Ambulatory Visit: Payer: Self-pay | Admitting: Physician Assistant

## 2014-04-02 MED ORDER — TRAMADOL HCL 50 MG PO TABS: ORAL_TABLET | ORAL | Status: DC

## 2014-04-02 MED ORDER — LORAZEPAM 1 MG PO TABS: ORAL_TABLET | ORAL | Status: DC

## 2014-04-13 ENCOUNTER — Encounter: Payer: Self-pay | Admitting: Physician Assistant

## 2014-04-13 ENCOUNTER — Ambulatory Visit (INDEPENDENT_AMBULATORY_CARE_PROVIDER_SITE_OTHER): Payer: BLUE CROSS/BLUE SHIELD | Admitting: Physician Assistant

## 2014-04-13 VITALS — BP 120/70 | HR 72 | Temp 97.7°F | Resp 16 | Ht 67.0 in | Wt 209.0 lb

## 2014-04-13 DIAGNOSIS — Z9884 Bariatric surgery status: Secondary | ICD-10-CM

## 2014-04-13 DIAGNOSIS — R748 Abnormal levels of other serum enzymes: Secondary | ICD-10-CM

## 2014-04-13 DIAGNOSIS — R7303 Prediabetes: Secondary | ICD-10-CM

## 2014-04-13 DIAGNOSIS — M2550 Pain in unspecified joint: Secondary | ICD-10-CM

## 2014-04-13 DIAGNOSIS — E559 Vitamin D deficiency, unspecified: Secondary | ICD-10-CM

## 2014-04-13 DIAGNOSIS — Z79899 Other long term (current) drug therapy: Secondary | ICD-10-CM

## 2014-04-13 DIAGNOSIS — E669 Obesity, unspecified: Secondary | ICD-10-CM

## 2014-04-13 DIAGNOSIS — I10 Essential (primary) hypertension: Secondary | ICD-10-CM

## 2014-04-13 DIAGNOSIS — R7309 Other abnormal glucose: Secondary | ICD-10-CM

## 2014-04-13 LAB — BASIC METABOLIC PANEL WITH GFR
BUN: 16 mg/dL (ref 6–23)
CALCIUM: 9.2 mg/dL (ref 8.4–10.5)
CO2: 27 mEq/L (ref 19–32)
CREATININE: 0.64 mg/dL (ref 0.50–1.10)
Chloride: 105 mEq/L (ref 96–112)
GFR, Est Non African American: >89 mL/min
Glucose, Bld: 82 mg/dL (ref 70–99)
POTASSIUM: 3.9 meq/L (ref 3.5–5.3)
Sodium: 138 mEq/L (ref 135–145)

## 2014-04-13 LAB — HEPATIC FUNCTION PANEL
ALT: 13 U/L (ref 0–35)
AST: 14 U/L (ref 0–37)
Albumin: 3.9 g/dL (ref 3.5–5.2)
Alkaline Phosphatase: 130 U/L — ABNORMAL HIGH (ref 39–117)
BILIRUBIN INDIRECT: 0.3 mg/dL (ref 0.2–1.2)
BILIRUBIN TOTAL: 0.4 mg/dL (ref 0.2–1.2)
Bilirubin, Direct: 0.1 mg/dL (ref 0.0–0.3)
Total Protein: 6.6 g/dL (ref 6.0–8.3)

## 2014-04-13 LAB — IRON AND TIBC
%SAT: 18 % — AB (ref 20–55)
Iron: 61 ug/dL (ref 42–145)
TIBC: 346 ug/dL (ref 250–470)
UIBC: 285 ug/dL (ref 125–400)

## 2014-04-13 LAB — LIPID PANEL
CHOLESTEROL: 122 mg/dL (ref 0–200)
HDL: 51 mg/dL (ref >=46)
LDL Cholesterol: 57 mg/dL (ref 0–99)
TRIGLYCERIDES: 68 mg/dL (ref <150)
Total CHOL/HDL Ratio: 2.4 Ratio
VLDL: 14 mg/dL (ref 0–40)

## 2014-04-13 LAB — MAGNESIUM: MAGNESIUM: 2.1 mg/dL (ref 1.5–2.5)

## 2014-04-13 MED ORDER — BETAMETHASONE DIPROPIONATE AUG 0.05 % EX OINT: TOPICAL_OINTMENT | Freq: Every day | CUTANEOUS | Status: DC

## 2014-04-13 NOTE — Progress Notes (Signed)
Assessment and Plan:  1. Hypertension -Continue medication, monitor blood pressure at home. Continue DASH diet.  Reminder to go to the ER if any CP, SOB, nausea, dizziness, severe HA, changes vision/speech, left arm numbness and tingling and jaw pain.  2. Cholesterol -Continue diet and exercise. Check cholesterol.   3. Prediabetes  -Continue diet and exercise. Check A1C  4. Vitamin D Def - check level and continue medications.   5. Elevated alk phos Normal AB [place], normal GGT Recheck labs, may get bone scan if still elevated  6. Patient is s/p gastric bypass-  Check labs to do to screen for vitamin deficiencies associated with gastric bypass including Vitamin D, B12, iron. Recommend strict diet and exercise.    Continue diet and meds as discussed. Further disposition pending results of labs. Over 30 minutes of exam, counseling, chart review, and critical decision making was performed  HPI 49 y.o. female  presents for 3 month follow up on hypertension, cholesterol, prediabetes, and vitamin D deficiency.   Her blood pressure has been controlled at home, today their BP is BP: 120/70 mmHg  She does workout. She denies chest pain, shortness of breath, dizziness.  She is not on cholesterol medication and denies myalgias. Her cholesterol is at goal. The cholesterol last visit was:   Lab Results  Component Value Date   CHOL 98 08/17/2013   HDL 46 08/17/2013   LDLCALC 42 08/17/2013   TRIG 50 08/17/2013   CHOLHDL 2.1 08/17/2013    She has been working on diet and exercise for prediabetes, and denies paresthesia of the feet, polydipsia, polyuria and visual disturbances. Last A1C in the office was:  Lab Results  Component Value Date   HGBA1C 5.8* 01/01/2014  Patient is on Vitamin D supplement, she is on 50000 IU 3 x a week   Lab Results  Component Value Date   VD25OH 26* 01/01/2014  BMI is Body mass index is 32.73 kg/(m^2)., she is working on diet and exercise. S/p gastric bypass sept  2013.  Wt Readings from Last 3 Encounters:  04/13/14 209 lb (94.802 kg)  01/01/14 196 lb (88.905 kg)  08/17/13 187 lb 12.8 oz (85.186 kg)  She has a history of elevated Alk phos x 03/2010 on and off, but has been consistently elevated since 2013, she has fibromyalgia, has bilateral shoulder/back pain that is dull/deep pain and is on tramadol for this which does help,  taking 1.5 and 1.5 and 1.5 and then one that helps. She has had a normal AB [place], normal GGT.  Lab Results  Component Value Date   ALT 14 03/19/2014   AST 15 03/19/2014   ALKPHOS 155* 03/19/2014   BILITOT 0.4 03/19/2014  She had a normal DEXA 09/2013, she has been on Depo for 12 years. She had a normal bone scan in 2001.  Lab Results  Component Value Date   [id number] 245 01/01/2014     Current Medications:  Current Outpatient Prescriptions on File Prior to Visit  Medication Sig Dispense Refill  . ALPRAZolam (XANAX) 1 MG tablet TAKE 1&1/2 TABLETS AT BEDTIME AS NEEDED. 45 tablet 0  . Aspirin-Acetaminophen-Caffeine (EXCEDRIN EXTRA STRENGTH PO) Take by mouth daily as needed.    Marland Kitchen augmented betamethasone dipropionate (DIPROLENE-AF) 0.05 % ointment Apply 1 application topically as needed. Rash    . cyclobenzaprine (FLEXERIL) 10 MG tablet Take 1 tablet (10 mg total) by mouth 2 (two) times daily as needed for muscle spasms. 10 tablet 0  . lisinopril-hydrochlorothiazide (PRINZIDE,ZESTORETIC) 20-12.5  MG per tablet TAKE 1 TABLET BY MOUTH DAILY. 90 tablet 0  . LORazepam (ATIVAN) 1 MG tablet TAKE 1 OR 2 TABLETS AT BEDTIME AS NEEDED FOR SLEEP. 60 tablet 2  . medroxyPROGESTERone (DEPO-PROVERA) 150 MG/ML injection Inject 150 mg into the muscle every 3 (three) months.     . metoCLOPramide (REGLAN) 10 MG tablet TAKE ONE TABLET BY MOUTH ONCE DAILY 90 tablet 0  . Multiple Vitamins-Minerals (MULTIVITAMIN WITH MINERALS) tablet Take 1 tablet by mouth daily. Bariatric multi vitamin.    . promethazine-codeine (PHENERGAN WITH CODEINE) 6.25-10  MG/5ML syrup Take 5 mLs by mouth every 6 (six) hours as needed for cough. 240 mL 0  . traMADol (ULTRAM) 50 MG tablet TAKE (2) TABLETS TWICE DAILY. 120 tablet 1  . Vitamin D, Ergocalciferol, (DRISDOL) 50000 UNITS CAPS capsule TAKE 1 CAPSULE (50,000 UNITS TOTAL) BY MOUTH 2 (TWO) TIMES A WEEK. 30 capsule 0   No current facility-administered medications on file prior to visit.   Medical History:  Past Medical History  Diagnosis Date  . Hypertension   . IBS (irritable bowel syndrome)   . Arthritis   . Fibromyalgia   . Nephrolithiasis   . Anemia   . Plantar fasciitis of right foot     wears boot    Allergies: No Known Allergies   Review of Systems:  Review of Systems  Constitutional: Positive for malaise/fatigue. Negative for fever, chills, weight loss and diaphoresis.  Eyes: Negative.   Respiratory: Negative.   Cardiovascular: Negative.   Gastrointestinal: Negative.   Genitourinary: Negative.   Musculoskeletal: Positive for myalgias, back pain, joint pain and neck pain. Negative for falls.  Skin: Negative.   Neurological: Negative.  Negative for weakness.  Psychiatric/Behavioral: Negative.     Family history- Review and unchanged Social history- Review and unchanged Physical Exam: BP 120/70 mmHg  Pulse 72  Temp(Src) 97.7 F (36.5 C)  Resp 16  Ht _0  (1.702 m)  Wt 209 lb (94.802 kg)  BMI 32.73 kg/m2 Wt Readings from Last 3 Encounters:  04/13/14 209 lb (94.802 kg)  01/01/14 196 lb (88.905 kg)  08/17/13 187 lb 12.8 oz (85.186 kg)   General Appearance: Well nourished, in no apparent distress. Eyes: PERRLA, EOMs, conjunctiva no swelling or erythema Sinuses: No Frontal/maxillary tenderness ENT/Mouth: Ext aud canals clear, TMs without erythema, bulging. No erythema, swelling, or exudate on post pharynx.  Tonsils not swollen or erythematous. Hearing normal.  Neck: Supple, thyroid normal.  Respiratory: Respiratory effort normal, BS equal bilaterally without rales, rhonchi,  wheezing or stridor.  Cardio: RRR with no MRGs. Brisk peripheral pulses without edema.  Abdomen: Soft, + BS,  Non tender, no guarding, rebound, hernias, masses. Lymphatics: Non tender without lymphadenopathy.  Musculoskeletal: Full ROM, 5/5 strength, Normal gait Skin: Warm, dry without rashes, lesions, ecchymosis.  Neuro: Cranial nerves intact. Normal muscle tone, no cerebellar symptoms. Psych: Awake and oriented X 3, normal affect, Insight and Judgment appropriate.    Vicie Mutters, PA-C 4:35 PM [facility]

## 2014-04-13 NOTE — Patient Instructions (Signed)
Diabetes is a very complicated disease...lets simplify it.  An easy way to look at it to understand the complications is if you think of the extra sugar floating in your blood stream as glass shards floating through your blood stream.    Diabetes affects your small vessels first: 1) The glass shards (sugar) scraps down the tiny blood vessels in your eyes and lead to diabetic retinopathy, the leading cause of blindness in the [place]. Diabetes is the leading cause of newly diagnosed adult (5020 to 48 years of age) blindness in the [place].  2) The glass shards scratches down the tiny vessels of your legs leading to nerve damage called neuropathy and can lead to amputations of your feet. More than 60% of all non-traumatic amputations of lower limbs occur in people with diabetes.  3) Over time the small vessels in your brain are shredded and closed off, individually this does not cause any problems but over a long period of time many of the small vessels being blocked can lead to Vascular Dementia.   4) Your kidney's are a filter system and have a "net" that keeps certain things in the body and lets bad things out. Sugar shreds this net and leads to kidney damage and eventually failure. Decreasing the sugar that is destroying the net and certain blood pressure medications can help stop or decrease progression of kidney disease. Diabetes was the primary cause of kidney failure in 44 percent of all new cases in 2011.  5) Diabetes also destroys the small vessels in your penis that lead to erectile dysfunction. Eventually the vessels are so damaged that you may not be responsive to cialis or viagra.   Diabetes and your large vessels: Your larger vessels consist of your coronary arteries in your heart and the carotid vessels to your brain. Diabetes or even increased sugars put you at 300% increased risk of heart attack and stroke and this is why.. The sugar scrapes down your large blood vessels and your body  sees this as an internal injury and tries to repair itself. Just like you get a scab on your skin, your platelets will stick to the blood vessel wall trying to heal it. This is why we have diabetics on low dose aspirin daily, this prevents the platelets from sticking and can prevent plaque formation. In addition, your body takes cholesterol and tries to shove it into the open wound. This is why we want your LDL, or bad cholesterol, below 70.   The combination of platelets and cholesterol over 5-10 years forms plaque that can break off and cause a heart attack or stroke.   PLEASE REMEMBER:  Diabetes is preventable! Up to 85 percent of complications and morbidities among individuals with type 2 diabetes can be prevented, delayed, or effectively treated and minimized with regular visits to a health professional, appropriate monitoring and medication, and a healthy diet and lifestyle.  Liver Profile A liver profile is a battery of tests which helps your caregiver evaluate your liver function. The following tests are often included in the liver profile: Alanine aminotransferase (ALT or SGPT) This is an enzyme found primarily in the liver. Abnormalities may represent liver disease. This is found in cells of the liver so when it is elevated, it has been released by damaged cells. Albumin - The serum albumin is one of the major proteins in the blood and a reflection of the general state of nutrition. This is low when the liver is unable to do  its job. It is also low when protein is lost in the urine. NORMAL FINDINGS Adult/Elderly  Total protein: 6.4-8.3 g/dL or 40-98 g/L (SI units)  Albumin: 3.5-5 g/dL or 11-91 g/L (SI units)  Globulin: 2.3-3.4 g/dL  Alpha1 globulin: 4.7-8.2 g/dL or 1-3 g/L (SI units)  Alpha2 globulin: 0.6-1 g/dL or 9-56 g/L (SI units)  Beta globulin: 0.7-1.1 g/dL or 2-13 g/L (SI units) Children  Total protein  Premature infant: 4.2-7.6 g/dL  Newborn: 0.8-6.5 g/dL  Infant:  7-8.4 g/dL  Child: 6.9-6 g/dL  Albumin  Premature infant: 3-4.2 g/dL  Newborn: 2.9-5.2 g/dL  Infant: 8.4-1.3 g/dL  Child: 2-4.4 g/dL Albumin/Globulin ratio - Calculated by dividing the albumin by the globulin. It is a measure of well being.  Alkaline phosphatase - This is an enzyme which is important in diagnosing proper bone and liver functions. NORMAL FINDINGS Age / Normal Value (units/L)  0-5 days / 35-140  Less than 3 yr / 15-60  3-6 yr / 15-50  6-12 yr / 10-50  12-18 yr / 10-40  Adult / 0-35 units/L or 0-0.58 microKat/L (SI Units) (Females tend to have slightly lower levels than males)  Elderly / Slightly higher than adults Aspartate aminotransferase (AST or SGOT) - an enzyme found in skeletal and heart muscle, liver and other organs. Abnormalities may represent liver disease. This is found in cells of the liver so when it is elevated, it has been released by damaged cells. Bilirubin, Total: A chemical involved with liver functions. High concentrations may result in jaundice. Jaundice is a yellowing of the skin and the whites of the eyes. NORMAL FINDINGS Blood  Adult/elderly/child  Total bilirubin: 0.3-1.0 mg/dL or 0.1-02 micromole/L (SI units)  Indirect bilirubin: 0.2-0.8 mg/dL or 7.2-53.6 micromole/L (SI units)  Direct bilirubin: 0.1-0.3 mg/dL or 6.4-4.0 micromole/L (SI units)  Newborn total bilirubin: 1.0-12.0 mg/dL or 34.7-425 micromole/L (SI units)  Urine0-0.02 mg/dL Ranges for normal findings may vary among different laboratories and hospitals. You should always check with your doctor after having lab work or other tests done to discuss the meaning of your test results and whether your values are considered within normal limits PREPARATION FOR TEST No preparation or fasting is necessary unless you have been informed otherwise. A blood sample is obtained by inserting a needle into a vein in the arm. MEANING OF TEST  Your caregiver will go over the test  results with you and discuss the importance and meaning of your results, as well as treatment options and the need for additional tests if necessary. OBTAINING THE TEST RESULTS It is your responsibility to obtain your test results. Ask the lab or department performing the test when and how you will get your results. Document Released: 02/04/2004 Document Revised: 03/26/2011 Document Reviewed: 05/05/2013 [facility] Patient Information 2015 [place], [place]. This information is not intended to replace advice given to you by your health care provider. Make sure you discuss any questions you have with your health care provider.

## 2014-04-14 LAB — HEMOGLOBIN A1C
Hgb A1c MFr Bld: 5.9 % — ABNORMAL HIGH (ref <5.7)
MEAN PLASMA GLUCOSE: 123 mg/dL — AB (ref <117)

## 2014-04-14 LAB — CBC WITH DIFFERENTIAL/PLATELET
BASOS ABS: 0 10*3/uL (ref 0.0–0.1)
Basophils Relative: 0 % (ref 0–1)
EOS PCT: 1 % (ref 0–5)
Eosinophils Absolute: 0.1 10*3/uL (ref 0.0–0.7)
HCT: 42.2 % (ref 36.0–46.0)
HEMOGLOBIN: 14.1 g/dL (ref 12.0–15.0)
LYMPHS PCT: 39 % (ref 12–46)
Lymphs Abs: 2.4 10*3/uL (ref 0.7–4.0)
MCH: 30.1 pg (ref 26.0–34.0)
MCHC: 33.4 g/dL (ref 30.0–36.0)
MCV: 90 fL (ref 78.0–100.0)
MONO ABS: 0.7 10*3/uL (ref 0.1–1.0)
MPV: 9.2 fL (ref 8.6–12.4)
Monocytes Relative: 11 % (ref 3–12)
Neutro Abs: 3 10*3/uL (ref 1.7–7.7)
Neutrophils Relative %: 49 % (ref 43–77)
Platelets: 282 10*3/uL (ref 150–400)
RBC: 4.69 MIL/uL (ref 3.87–5.11)
RDW: 14.6 % (ref 11.5–15.5)
WBC: 6.2 10*3/uL (ref 4.0–10.5)

## 2014-04-14 LAB — TSH: TSH: 1.491 u[IU]/mL (ref 0.350–4.500)

## 2014-04-14 LAB — VITAMIN B12: VITAMIN B 12: 511 pg/mL (ref 211–911)

## 2014-04-14 LAB — VITAMIN D 25 HYDROXY (VIT D DEFICIENCY, FRACTURES): Vit D, 25-Hydroxy: 36 ng/mL (ref 30–100)

## 2014-04-14 LAB — INSULIN, FASTING: INSULIN FASTING, SERUM: 8.7 u[IU]/mL (ref 2.0–19.6)

## 2014-04-14 LAB — FERRITIN: Ferritin: 31 ng/mL (ref 10–291)

## 2014-04-22 ENCOUNTER — Other Ambulatory Visit: Payer: Self-pay | Admitting: Internal Medicine

## 2014-04-22 ENCOUNTER — Encounter: Payer: Self-pay | Admitting: Physician Assistant

## 2014-04-22 MED ORDER — PREDNISONE 20 MG PO TABS: ORAL_TABLET | ORAL | Status: DC

## 2014-04-22 MED ORDER — BENZONATATE 100 MG PO CAPS: 100.0000 mg | ORAL_CAPSULE | Freq: Four times a day (QID) | ORAL | Status: DC | PRN

## 2014-06-01 ENCOUNTER — Other Ambulatory Visit: Payer: Self-pay | Admitting: Physician Assistant

## 2014-06-03 ENCOUNTER — Other Ambulatory Visit: Payer: Self-pay | Admitting: Internal Medicine

## 2014-06-04 ENCOUNTER — Ambulatory Visit: Payer: Self-pay

## 2014-06-10 ENCOUNTER — Ambulatory Visit (INDEPENDENT_AMBULATORY_CARE_PROVIDER_SITE_OTHER): Payer: BLUE CROSS/BLUE SHIELD

## 2014-06-10 DIAGNOSIS — Z3040 Encounter for surveillance of contraceptives, unspecified: Secondary | ICD-10-CM

## 2014-06-10 MED ORDER — MEDROXYPROGESTERONE ACETATE 150 MG/ML IM SUSP
150.0000 mg | Freq: Once | INTRAMUSCULAR | Status: AC
  Administered 2014-06-10: 150 mg via INTRAMUSCULAR

## 2014-06-10 NOTE — Progress Notes (Signed)
Patient ID: Wendy NineLisa A Valencia, female   DOB: 1965-03-24, 49 y.o.   MRN: [id number] Patient here today for Depo Provera. Patient received 150 mg  IM left glut. Patient tolerated well.

## 2014-06-14 ENCOUNTER — Other Ambulatory Visit: Payer: Self-pay | Admitting: Physician Assistant

## 2014-07-02 ENCOUNTER — Other Ambulatory Visit: Payer: Self-pay | Admitting: Physician Assistant

## 2014-07-29 ENCOUNTER — Other Ambulatory Visit: Payer: Self-pay | Admitting: Physician Assistant

## 2014-08-26 ENCOUNTER — Encounter: Payer: Self-pay | Admitting: Emergency Medicine

## 2014-08-27 ENCOUNTER — Other Ambulatory Visit: Payer: Self-pay | Admitting: Internal Medicine

## 2014-08-30 ENCOUNTER — Ambulatory Visit (INDEPENDENT_AMBULATORY_CARE_PROVIDER_SITE_OTHER): Payer: BLUE CROSS/BLUE SHIELD | Admitting: Physician Assistant

## 2014-08-30 VITALS — BP 118/72 | HR 76 | Temp 97.7°F | Resp 16 | Ht 67.0 in | Wt 198.2 lb

## 2014-08-30 DIAGNOSIS — I10 Essential (primary) hypertension: Secondary | ICD-10-CM

## 2014-08-30 DIAGNOSIS — Z3040 Encounter for surveillance of contraceptives, unspecified: Secondary | ICD-10-CM

## 2014-08-30 DIAGNOSIS — E559 Vitamin D deficiency, unspecified: Secondary | ICD-10-CM | POA: Diagnosis not present

## 2014-08-30 DIAGNOSIS — Z720 Tobacco use: Secondary | ICD-10-CM

## 2014-08-30 DIAGNOSIS — R748 Abnormal levels of other serum enzymes: Secondary | ICD-10-CM

## 2014-08-30 DIAGNOSIS — Z79899 Other long term (current) drug therapy: Secondary | ICD-10-CM | POA: Insufficient documentation

## 2014-08-30 DIAGNOSIS — R6882 Decreased libido: Secondary | ICD-10-CM

## 2014-08-30 DIAGNOSIS — Z9884 Bariatric surgery status: Secondary | ICD-10-CM | POA: Insufficient documentation

## 2014-08-30 DIAGNOSIS — G4700 Insomnia, unspecified: Secondary | ICD-10-CM

## 2014-08-30 DIAGNOSIS — M797 Fibromyalgia: Secondary | ICD-10-CM

## 2014-08-30 DIAGNOSIS — Z0000 Encounter for general adult medical examination without abnormal findings: Secondary | ICD-10-CM | POA: Diagnosis not present

## 2014-08-30 DIAGNOSIS — Z0001 Encounter for general adult medical examination with abnormal findings: Secondary | ICD-10-CM

## 2014-08-30 DIAGNOSIS — R7303 Prediabetes: Secondary | ICD-10-CM

## 2014-08-30 DIAGNOSIS — F411 Generalized anxiety disorder: Secondary | ICD-10-CM

## 2014-08-30 LAB — HEMOGLOBIN A1C
Hgb A1c MFr Bld: 5.8 % — ABNORMAL HIGH (ref <5.7)
Mean Plasma Glucose: 120 mg/dL — ABNORMAL HIGH (ref <117)

## 2014-08-30 LAB — CBC WITH DIFFERENTIAL/PLATELET
BASOS ABS: 0 10*3/uL (ref 0.0–0.1)
Basophils Relative: 0 % (ref 0–1)
EOS ABS: 0.1 10*3/uL (ref 0.0–0.7)
Eosinophils Relative: 1 % (ref 0–5)
HCT: 41.8 % (ref 36.0–46.0)
HEMOGLOBIN: 14.3 g/dL (ref 12.0–15.0)
LYMPHS ABS: 2 10*3/uL (ref 0.7–4.0)
Lymphocytes Relative: 32 % (ref 12–46)
MCH: 30.5 pg (ref 26.0–34.0)
MCHC: 34.2 g/dL (ref 30.0–36.0)
MCV: 89.1 fL (ref 78.0–100.0)
MPV: 9.2 fL (ref 8.6–12.4)
Monocytes Absolute: 0.5 10*3/uL (ref 0.1–1.0)
Monocytes Relative: 9 % (ref 3–12)
NEUTROS ABS: 3.5 10*3/uL (ref 1.7–7.7)
Neutrophils Relative %: 58 % (ref 43–77)
PLATELETS: 310 10*3/uL (ref 150–400)
RBC: 4.69 MIL/uL (ref 3.87–5.11)
RDW: 14.8 % (ref 11.5–15.5)
WBC: 6.1 10*3/uL (ref 4.0–10.5)

## 2014-08-30 MED ORDER — FLIBANSERIN 100 MG PO TABS: 100.0000 mg | ORAL_TABLET | Freq: Every day | ORAL | Status: DC

## 2014-08-30 MED ORDER — MEDROXYPROGESTERONE ACETATE 150 MG/ML IM SUSP
150.0000 mg | Freq: Once | INTRAMUSCULAR | Status: AC
  Administered 2014-08-30: 150 mg via INTRAMUSCULAR

## 2014-08-30 NOTE — Patient Instructions (Signed)
Preventive Care for Adults A healthy lifestyle and preventive care can promote health and wellness. Preventive health guidelines for women include the following key practices.  A routine yearly physical is a good way to check with your health care provider about your health and preventive screening. It is a chance to share any concerns and updates on your health and to receive a thorough exam.  Visit your dentist for a routine exam and preventive care every 6 months. Brush your teeth twice a day and floss once a day. Good oral hygiene prevents tooth decay and gum disease.  The frequency of eye exams is based on your age, health, family medical history, use of contact lenses, and other factors. Follow your health care provider's recommendations for frequency of eye exams.  Eat a healthy diet. Foods like vegetables, fruits, whole grains, low-fat dairy products, and lean protein foods contain the nutrients you need without too many calories. Decrease your intake of foods high in solid fats, added sugars, and salt. Eat the right amount of calories for you.Get information about a proper diet from your health care provider, if necessary.  Regular physical exercise is one of the most important things you can do for your health. Most adults should get at least 150 minutes of moderate-intensity exercise (any activity that increases your heart rate and causes you to sweat) each week. In addition, most adults need muscle-strengthening exercises on 2 or more days a week.  Maintain a healthy weight. The body mass index (BMI) is a screening tool to identify possible weight problems. It provides an estimate of body fat based on height and weight. Your health care provider can find your BMI and can help you achieve or maintain a healthy weight.For adults 20 years and older:  A BMI below 18.5 is considered underweight.  A BMI of 18.5 to 24.9 is normal.  A BMI of 25 to 29.9 is considered overweight.  A BMI of  30 and above is considered obese.  Maintain normal blood lipids and cholesterol levels by exercising and minimizing your intake of saturated fat. Eat a balanced diet with plenty of fruit and vegetables. If your lipid or cholesterol levels are high, you are over 50, or you are at high risk for heart disease, you may need your cholesterol levels checked more frequently.Ongoing high lipid and cholesterol levels should be treated with medicines if diet and exercise are not working.  If you smoke, find out from your health care provider how to quit. If you do not use tobacco, do not start.  Lung cancer screening is recommended for adults aged 62-80 years who are at high risk for developing lung cancer because of a history of smoking. A yearly low-dose CT scan of the lungs is recommended for people who have at least a 30-pack-year history of smoking and are a current smoker or have quit within the past 15 years. A pack year of smoking is smoking an average of 1 pack of cigarettes a day for 1 year (for example: 1 pack a day for 30 years or 2 packs a day for 15 years). Yearly screening should continue until the smoker has stopped smoking for at least 15 years. Yearly screening should be stopped for people who develop a health problem that would prevent them from having lung cancer treatment.  Avoid use of street drugs. Do not share needles with anyone. Ask for help if you need support or instructions about stopping the use of drugs.  High blood  pressure causes heart disease and increases the risk of stroke.  Ongoing high blood pressure should be treated with medicines if weight loss and exercise do not work.  If you are 55-79 years old, ask your health care provider if you should take aspirin to prevent strokes.  Diabetes screening involves taking a blood sample to check your fasting blood sugar level. This should be done once every 3 years, after age 45, if you are within normal weight and without risk  factors for diabetes. Testing should be considered at a younger age or be carried out more frequently if you are overweight and have at least 1 risk factor for diabetes.  Breast cancer screening is essential preventive care for women. You should practice "breast self-awareness." This means understanding the normal appearance and feel of your breasts and may include breast self-examination. Any changes detected, no matter how small, should be reported to a health care provider. Women in their 20s and 30s should have a clinical breast exam (CBE) by a health care provider as part of a regular health exam every 1 to 3 years. After age 40, women should have a CBE every year. Starting at age 40, women should consider having a mammogram (breast X-ray test) every year. Women who have a family history of breast cancer should talk to their health care provider about genetic screening. Women at a high risk of breast cancer should talk to their health care providers about having an MRI and a mammogram every year.  Breast cancer gene (BRCA)-related cancer risk assessment is recommended for women who have family members with BRCA-related cancers. BRCA-related cancers include breast, ovarian, tubal, and peritoneal cancers. Having family members with these cancers may be associated with an increased risk for harmful changes (mutations) in the breast cancer genes BRCA1 and BRCA2. Results of the assessment will determine the need for genetic counseling and BRCA1 and BRCA2 testing.  Routine pelvic exams to screen for cancer are no longer recommended for nonpregnant women who are considered low risk for cancer of the pelvic organs (ovaries, uterus, and vagina) and who do not have symptoms. Ask your health care provider if a screening pelvic exam is right for you.  If you have had past treatment for cervical cancer or a condition that could lead to cancer, you need Pap tests and screening for cancer for at least 20 years after  your treatment. If Pap tests have been discontinued, your risk factors (such as having a new sexual partner) need to be reassessed to determine if screening should be resumed. Some women have medical problems that increase the chance of getting cervical cancer. In these cases, your health care provider may recommend more frequent screening and Pap tests.    Colorectal cancer can be detected and often prevented. Most routine colorectal cancer screening begins at the age of 50 years and continues through age 75 years. However, your health care provider may recommend screening at an earlier age if you have risk factors for colon cancer. On a yearly basis, your health care provider may provide home test kits to check for hidden blood in the stool. Use of a small camera at the end of a tube, to directly examine the colon (sigmoidoscopy or colonoscopy), can detect the earliest forms of colorectal cancer. Talk to your health care provider about this at age 50, when routine screening begins. Direct exam of the colon should be repeated every 5-10 years through age 75 years, unless early forms of pre-cancerous polyps   or small growths are found.  Osteoporosis is a disease in which the bones lose minerals and strength with aging. This can result in serious bone fractures or breaks. The risk of osteoporosis can be identified using a bone density scan. Women ages 36 years and over and women at risk for fractures or osteoporosis should discuss screening with their health care providers. Ask your health care provider whether you should take a calcium supplement or vitamin D to reduce the rate of osteoporosis.  Menopause can be associated with physical symptoms and risks. Hormone replacement therapy is available to decrease symptoms and risks. You should talk to your health care provider about whether hormone replacement therapy is right for you.  Use sunscreen. Apply sunscreen liberally and repeatedly throughout the day.  You should seek shade when your shadow is shorter than you. Protect yourself by wearing long sleeves, pants, a wide-brimmed hat, and sunglasses year round, whenever you are outdoors.  Once a month, do a whole body skin exam, using a mirror to look at the skin on your back. Tell your health care provider of new moles, moles that have irregular borders, moles that are larger than a pencil eraser, or moles that have changed in shape or color.  Stay current with required vaccines (immunizations).  Influenza vaccine. All adults should be immunized every year.  Tetanus, diphtheria, and acellular pertussis (Td, Tdap) vaccine. Pregnant women should receive 1 dose of Tdap vaccine during each pregnancy. The dose should be obtained regardless of the length of time since the last dose. Immunization is preferred during the 27th-36th week of gestation. An adult who has not previously received Tdap or who does not know her vaccine status should receive 1 dose of Tdap. This initial dose should be followed by tetanus and diphtheria toxoids (Td) booster doses every 10 years. Adults with an unknown or incomplete history of completing a 3-dose immunization series with Td-containing vaccines should begin or complete a primary immunization series including a Tdap dose. Adults should receive a Td booster every 10 years.    Zoster vaccine. One dose is recommended for adults aged 31 years or older unless certain conditions are present.    Pneumococcal 13-valent conjugate (PCV13) vaccine. When indicated, a person who is uncertain of her immunization history and has no record of immunization should receive the PCV13 vaccine. An adult aged 5 years or older who has certain medical conditions and has not been previously immunized should receive 1 dose of PCV13 vaccine. This PCV13 should be followed with a dose of pneumococcal polysaccharide (PPSV23) vaccine. The PPSV23 vaccine dose should be obtained at least 8 weeks after the  dose of PCV13 vaccine. An adult aged 80 years or older who has certain medical conditions and previously received 1 or more doses of PPSV23 vaccine should receive 1 dose of PCV13. The PCV13 vaccine dose should be obtained 1 or more years after the last PPSV23 vaccine dose.    Pneumococcal polysaccharide (PPSV23) vaccine. When PCV13 is also indicated, PCV13 should be obtained first. All adults aged 110 years and older should be immunized. An adult younger than age 11 years who has certain medical conditions should be immunized. Any person who resides in a nursing home or long-term care facility should be immunized. An adult smoker should be immunized. People with an immunocompromised condition and certain other conditions should receive both PCV13 and PPSV23 vaccines. People with human immunodeficiency virus (HIV) infection should be immunized as soon as possible after diagnosis. Immunization during  chemotherapy or radiation therapy should be avoided. Routine use of PPSV23 vaccine is not recommended for American Indians, [place], or people younger than 65 years unless there are medical conditions that require PPSV23 vaccine. When indicated, people who have unknown immunization and have no record of immunization should receive PPSV23 vaccine. One-time revaccination 5 years after the first dose of PPSV23 is recommended for people aged 19-64 years who have chronic kidney failure, nephrotic syndrome, asplenia, or immunocompromised conditions. People who received 1-2 doses of PPSV23 before age 68 years should receive another dose of PPSV23 vaccine at age 34 years or later if at least 5 years have passed since the previous dose. Doses of PPSV23 are not needed for people immunized with PPSV23 at or after age 17 years.   Preventive Services / Frequency  Ages 75 years and over  Blood pressure check.  Lipid and cholesterol check.  Lung cancer screening. / Every year if you are aged 7-80 years and have a  30-pack-year history of smoking and currently smoke or have quit within the past 15 years. Yearly screening is stopped once you have quit smoking for at least 15 years or develop a health problem that would prevent you from having lung cancer treatment.  Clinical breast exam.** / Every year after age 58 years.  BRCA-related cancer risk assessment.** / For women who have family members with a BRCA-related cancer (breast, ovarian, tubal, or peritoneal cancers).  Mammogram.** / Every year beginning at age 71 years and continuing for as long as you are in good health. Consult with your health care provider.  Pap test.** / Every 3 years starting at age 30 years through age 54 or 60 years with 3 consecutive normal Pap tests. Testing can be stopped between 65 and 70 years with 3 consecutive normal Pap tests and no abnormal Pap or HPV tests in the past 10 years.  Fecal occult blood test (FOBT) of stool. / Every year beginning at age 75 years and continuing until age 13 years. You may not need to do this test if you get a colonoscopy every 10 years.  Flexible sigmoidoscopy or colonoscopy.** / Every 5 years for a flexible sigmoidoscopy or every 10 years for a colonoscopy beginning at age 51 years and continuing until age 34 years.  Hepatitis C blood test.** / For all people born from [birth year] through 1964 and any individual with known risks for hepatitis C.  Osteoporosis screening.** / A one-time screening for women ages 68 years and over and women at risk for fractures or osteoporosis.  Skin self-exam. / Monthly.  Influenza vaccine. / Every year.  Tetanus, diphtheria, and acellular pertussis (Tdap/Td) vaccine.** / 1 dose of Td every 10 years.  Zoster vaccine.** / 1 dose for adults aged 70 years or older.  Pneumococcal 13-valent conjugate (PCV13) vaccine.** / Consult your health care provider.  Pneumococcal polysaccharide (PPSV23) vaccine.** / 1 dose for all adults aged 58 years and older. Screening  for abdominal aortic aneurysm (AAA)  by ultrasound is recommended for people who have history of high blood pressure or who are current or former smokers.

## 2014-08-30 NOTE — Progress Notes (Signed)
Complete Physical  Assessment and Plan: 1. Essential hypertension - continue medications, DASH diet, exercise and monitor at home. Call if greater than 130/80.  - CBC with Differential/Platelet - BASIC METABOLIC PANEL WITH GFR - Hepatic function panel - TSH - Urinalysis, Routine w reflex microscopic (not at [facility]) - Microalbumin / creatinine urine ratio - EKG 12-Lead  2. Prediabetes Discussed general issues about diabetes pathophysiology and management., Educational material distributed., Suggested low cholesterol diet., Encouraged aerobic exercise., Discussed foot care., Reminded to get yearly retinal exam. - Hemoglobin A1c - Lipid panel - Insulin, fasting  3. Morbid obesity Obesity with co morbidities- long discussion about weight loss, diet, and exercise  4. Tobacco abuse Smoking cessation-  instruction/counseling given, counseled patient on the dangers of tobacco use, advised patient to stop smoking, and reviewed strategies to maximize success, patient not ready to quit at this time.   5. Medication management - Magnesium  6. Vitamin D deficiency - Vit D  25 hydroxy (rtn osteoporosis monitoring)  7. Insomnia  good sleep hygiene discussed, increase day time activity  8. Anxiety state continue medications, stress management techniques discussed, increase water, good sleep hygiene discussed, increase exercise, and increase veggies.    9. Fibromyalgia Monitor, discuss tramadol use, if ever uses more will send to pain management, understands.   10. S/P gastric bypass Patient is s/p gastric bypass- Check labs to do to screen for vitamin deficiencies associated with gastric bypass including Vitamin D, B12, iron. Recommend strict diet and exercise.  - Iron and TIBC - Ferritin - Vitamin B12  11. Elevated liver enzymes Will monitor, avoid tylenol  12. Encounter for general adult medical examination with abnormal findings Up to date MGM, colonoscopy, and pap  13.  Decreased sex drive Would like to try addy, she does not drink and understands to take at night, card given - Flibanserin (ADDYI) 100 MG TABS; Take 100 mg by mouth daily.  Dispense: 30 tablet; Refill: 3  Discussed med's effects and SE's. Screening labs and tests as requested with regular follow-up as recommended. Over 40 minutes of exam, counseling, chart review, and complex, high level critical decision making was performed this visit.   HPI  49 y.o. female  presents for a complete physical.  Her blood pressure has been controlled at home, today their BP is BP: 118/72 mmHg She does workout. She denies chest pain, shortness of breath, dizziness.  She is not on cholesterol medication and denies myalgias. Her cholesterol is at goal. The cholesterol last visit was:   Lab Results  Component Value Date   CHOL 122 04/13/2014   HDL 51 04/13/2014   LDLCALC 57 04/13/2014   TRIG 68 04/13/2014   CHOLHDL 2.4 04/13/2014   She has been working on diet and exercise for prediabetes, and denies paresthesia of the feet, polydipsia, polyuria and visual disturbances. Last A1C in the office was:  Lab Results  Component Value Date   HGBA1C 5.9* 04/13/2014   Patient is on Vitamin D supplement.   Lab Results  Component Value Date   VD25OH 36 04/13/2014     BMI is Body mass index is 31.04 kg/(m^2)., she is working on diet and exercise s/p gastric bypass Sept 2013.  Wt Readings from Last 3 Encounters:  08/30/14 198 lb 3.2 oz (89.903 kg)  04/13/14 209 lb (94.802 kg)  01/01/14 196 lb (88.905 kg)   She has FM and takes tramadol for this, will take 1.5 tablets 3 times a day and occ another at night.  She has had elevated alk phos with normal Ab [place], GGT and bone scan in 2001. Has had negative autoimmune workup in the past.  She had a normal DEXA 09/2013, has been on depo for 12 years. She does smoke, has cut back, goal is to quit when she is 51.   Is going through menopause and states she is having a hard  time, very moody, decreased sexual drive.  She takes 2 mg of ativan for sleep.    Current Medications:  Current Outpatient Prescriptions on File Prior to Visit  Medication Sig Dispense Refill  . Aspirin-Acetaminophen-Caffeine (EXCEDRIN EXTRA STRENGTH PO) Take by mouth daily as needed.    Marland Kitchen augmented betamethasone dipropionate (DIPROLENE-AF) 0.05 % ointment Apply topically daily. 50 g 3  . cyclobenzaprine (FLEXERIL) 10 MG tablet Take 1 tablet (10 mg total) by mouth 2 (two) times daily as needed for muscle spasms. 10 tablet 0  . lisinopril-hydrochlorothiazide (PRINZIDE,ZESTORETIC) 20-12.5 MG per tablet TAKE 1 TABLET BY MOUTH DAILY. 90 tablet 3  . LORazepam (ATIVAN) 1 MG tablet TAKE 1 OR 2 TABLETS AT BEDTIME AS NEEDED FOR SLEEP. 60 tablet 3  . medroxyPROGESTERone (DEPO-PROVERA) 150 MG/ML injection Inject 150 mg into the muscle every 3 (three) months.     . metoCLOPramide (REGLAN) 10 MG tablet TAKE ONE TABLET BY MOUTH ONCE DAILY 90 tablet 0  . Multiple Vitamins-Minerals (MULTIVITAMIN WITH MINERALS) tablet Take 1 tablet by mouth daily. Bariatric multi vitamin.    . traMADol (ULTRAM) 50 MG tablet TAKE (2) TABLETS TWICE DAILY. 120 tablet 0  . Vitamin D, Ergocalciferol, (DRISDOL) 50000 UNITS CAPS capsule TAKE ONE CAPSULE BY MOUTH TWICE A WEEK 30 capsule 1   No current facility-administered medications on file prior to visit.   Health Maintenance:   Immunization History  Administered Date(s) Administered  . Influenza Split 10/02/2011, 10/16/2013  . Pneumococcal Polysaccharide-23 10/02/2011   Tetanus: 2008 Pneumovax: 2013 Prevnar 13: N/A Flu vaccine: 2015 Zostavax: N/A LMP: post menopausal Pap: 02/2013 MGM:  09/2013 DEXA: 09/2013  Colonoscopy: 06/2011 normal EGD: N/A Myoview stress test 10/2010  Last Dental Exam: Dr. Quincy Simmonds Last Eye Exam: Dr Randall Hiss DERM Dr. Raquel Sarna, OB/GYN Patient Care Team: Unk Pinto, MD as PCP - General (Internal Medicine) Bryson Ha Himmelrich, RD as Dietitian  (Bariatrics) Ladene Artist, MD as Consulting Physician (Gastroenterology)  Allergies: No Known Allergies Medical History:  Past Medical History  Diagnosis Date  . Hypertension   . IBS (irritable bowel syndrome)   . Arthritis   . Fibromyalgia   . Nephrolithiasis   . Anemia   . Plantar fasciitis of right foot     wears boot    Surgical History:  Past Surgical History  Procedure Laterality Date  . Left shoulder arthroscopic surgery  yrs ago  . Tummy tuck  2005  . Breath tek h pylori  07/11/2011    Procedure: BREATH TEK H PYLORI;  Surgeon: Edward Jolly, MD;  Location: [facility];  Service: General;  Laterality: N/A;  . Gastric roux-en-y  10/01/2011    Procedure: LAPAROSCOPIC ROUX-EN-Y GASTRIC;  Surgeon: Edward Jolly, MD;  Location: [facility];  Service: General;  Laterality: N/A;  . Cholecystectomy  10/01/2011    Procedure: LAPAROSCOPIC CHOLECYSTECTOMY WITH INTRAOPERATIVE CHOLANGIOGRAM;  Surgeon: Edward Jolly, MD;  Location: [facility];  Service: General;  Laterality: N/A;   Family History:  Family History  Problem Relation Age of Onset  . Heart attack Maternal Grandfather   . Heart disease Maternal Grandfather   .  Heart attack Paternal Grandfather   . Breast cancer Mother 60    breast  . Cancer Mother 76    breast/ ovarian  . Obesity Brother   . Breast cancer Maternal Aunt 58    died at age 19  . Cancer Maternal Aunt     breast  . Multiple sclerosis Maternal Uncle   . Lung cancer Maternal Grandmother   . Cancer Maternal Grandmother     ovarian  . Heart disease Father   . Hypertension Father   . Stroke Father    Social History:  Social History  Substance Use Topics  . Smoking status: Current Every Day Smoker -- 0.25 packs/day for 7 years    Types: Cigarettes  . Smokeless tobacco: Never Used  . Alcohol Use: 0.6 oz/week    1 Glasses of wine per week     Comment: 1x week    Review of Systems: Review of Systems  Constitutional: Positive for  malaise/fatigue. Negative for fever, chills, weight loss and diaphoresis.  Eyes: Negative.   Respiratory: Negative.   Cardiovascular: Negative.   Gastrointestinal: Negative.   Genitourinary: Negative.   Musculoskeletal: Positive for myalgias, back pain, joint pain and neck pain. Negative for falls.  Skin: Negative.   Neurological: Negative.  Negative for weakness.  Psychiatric/Behavioral: Negative.     Physical Exam: Estimated body mass index is 31.04 kg/(m^2) as calculated from the following:   Height as of this encounter: _0  (1.702 m).   Weight as of this encounter: 198 lb 3.2 oz (89.903 kg). BP 118/72 mmHg  Pulse 76  Temp(Src) 97.7 F (36.5 C)  Resp 16  Ht _1  (1.702 m)  Wt 198 lb 3.2 oz (89.903 kg)  BMI 31.04 kg/m2 General Appearance: Well nourished, in no apparent distress.  Eyes: PERRLA, EOMs, conjunctiva no swelling or erythema, normal fundi and vessels.  Sinuses: No Frontal/maxillary tenderness  ENT/Mouth: Ext aud canals clear, normal light reflex with TMs without erythema, bulging. Good dentition. No erythema, swelling, or exudate on post pharynx. Tonsils not swollen or erythematous. Hearing normal.  Neck: Supple, thyroid normal. No bruits  Respiratory: Respiratory effort normal, BS equal bilaterally without rales, rhonchi, wheezing or stridor.  Cardio: RRR without murmurs, rubs or gallops. Brisk peripheral pulses without edema.  Chest: symmetric, with normal excursions and percussion.  Breasts: defer  Abdomen: Soft, nontender, no guarding, rebound, hernias, masses, or organomegaly.  Lymphatics: Non tender without lymphadenopathy.  Genitourinary: defer Musculoskeletal: Full ROM all peripheral extremities,5/5 strength, and normal gait.  Skin: Warm, dry without rashes, lesions, ecchymosis. Neuro: Cranial nerves intact, reflexes equal bilaterally. Normal muscle tone, no cerebellar symptoms. Sensation intact.  Psych: Awake and oriented X 3, normal affect, Insight and  Judgment appropriate.   EKG: declines AORTA SCAN: defer   Vicie Mutters 9:11 AM [facility]

## 2014-08-31 LAB — URINALYSIS, MICROSCOPIC ONLY
BACTERIA UA: NONE SEEN [HPF]
CRYSTALS: NONE SEEN [HPF]
Casts: NONE SEEN [LPF]
SQUAMOUS EPITHELIAL / LPF: NONE SEEN [HPF] (ref <=5)
WBC UA: NONE SEEN WBC/HPF (ref <=5)
Yeast: NONE SEEN [HPF]

## 2014-08-31 LAB — URINALYSIS, ROUTINE W REFLEX MICROSCOPIC
Bilirubin Urine: NEGATIVE
GLUCOSE, UA: NEGATIVE
Hgb urine dipstick: NEGATIVE
Ketones, ur: NEGATIVE
Nitrite: NEGATIVE
PH: 6 (ref 5.0–8.0)
PROTEIN: NEGATIVE
Specific Gravity, Urine: 1.025 (ref 1.001–1.035)

## 2014-08-31 LAB — LIPID PANEL
CHOL/HDL RATIO: 2.4 ratio (ref <=5.0)
CHOLESTEROL: 118 mg/dL — AB (ref 125–200)
HDL: 49 mg/dL (ref >=46)
LDL Cholesterol: 51 mg/dL (ref <130)
TRIGLYCERIDES: 92 mg/dL (ref <150)
VLDL: 18 mg/dL (ref <30)

## 2014-08-31 LAB — HEPATIC FUNCTION PANEL
ALK PHOS: 136 U/L — AB (ref 33–115)
ALT: 19 U/L (ref 6–29)
AST: 18 U/L (ref 10–35)
Albumin: 4.2 g/dL (ref 3.6–5.1)
BILIRUBIN DIRECT: 0.1 mg/dL (ref <=0.2)
BILIRUBIN TOTAL: 0.5 mg/dL (ref 0.2–1.2)
Indirect Bilirubin: 0.4 mg/dL (ref 0.2–1.2)
Total Protein: 6.9 g/dL (ref 6.1–8.1)

## 2014-08-31 LAB — IRON AND TIBC
%SAT: 35 % (ref 20–55)
Iron: 115 ug/dL (ref 42–145)
TIBC: 327 ug/dL (ref 250–470)
UIBC: 212 ug/dL (ref 125–400)

## 2014-08-31 LAB — BASIC METABOLIC PANEL WITH GFR
BUN: 11 mg/dL (ref 7–25)
CHLORIDE: 101 mmol/L (ref 98–110)
CO2: 25 mmol/L (ref 20–31)
Calcium: 9.6 mg/dL (ref 8.6–10.2)
Creat: 0.66 mg/dL (ref 0.50–1.10)
GFR, Est African American: >89 mL/min (ref >=60)
GFR, Est Non African American: >89 mL/min (ref >=60)
Glucose, Bld: 94 mg/dL (ref 65–99)
Potassium: 4.1 mmol/L (ref 3.5–5.3)
SODIUM: 140 mmol/L (ref 135–146)

## 2014-08-31 LAB — FERRITIN: Ferritin: 50 ng/mL (ref 10–291)

## 2014-08-31 LAB — TSH: TSH: 1.174 u[IU]/mL (ref 0.350–4.500)

## 2014-08-31 LAB — VITAMIN D 25 HYDROXY (VIT D DEFICIENCY, FRACTURES): Vit D, 25-Hydroxy: 30 ng/mL (ref 30–100)

## 2014-08-31 LAB — MICROALBUMIN / CREATININE URINE RATIO
CREATININE, URINE: 191.1 mg/dL
MICROALB UR: 0.7 mg/dL (ref <2.0)
MICROALB/CREAT RATIO: 3.7 mg/g (ref 0.0–30.0)

## 2014-08-31 LAB — INSULIN, FASTING: Insulin fasting, serum: 11.4 u[IU]/mL (ref 2.0–19.6)

## 2014-08-31 LAB — MAGNESIUM: Magnesium: 2.2 mg/dL (ref 1.5–2.5)

## 2014-08-31 LAB — VITAMIN B12: Vitamin B-12: 539 pg/mL (ref 211–911)

## 2014-09-01 ENCOUNTER — Other Ambulatory Visit: Payer: Self-pay | Admitting: Physician Assistant

## 2014-09-02 NOTE — Telephone Encounter (Signed)
Rx called in 

## 2014-09-30 ENCOUNTER — Other Ambulatory Visit: Payer: Self-pay | Admitting: Physician Assistant

## 2014-10-01 NOTE — Telephone Encounter (Signed)
Rx called in @ [facility]

## 2014-10-31 ENCOUNTER — Other Ambulatory Visit: Payer: Self-pay | Admitting: Physician Assistant

## 2014-11-01 ENCOUNTER — Other Ambulatory Visit: Payer: Self-pay | Admitting: Physician Assistant

## 2014-11-03 ENCOUNTER — Other Ambulatory Visit: Payer: Self-pay | Admitting: Internal Medicine

## 2014-11-15 ENCOUNTER — Ambulatory Visit (INDEPENDENT_AMBULATORY_CARE_PROVIDER_SITE_OTHER): Payer: BLUE CROSS/BLUE SHIELD | Admitting: *Deleted

## 2014-11-15 DIAGNOSIS — Z23 Encounter for immunization: Secondary | ICD-10-CM

## 2014-11-15 DIAGNOSIS — Z3049 Encounter for surveillance of other contraceptives: Secondary | ICD-10-CM

## 2014-11-15 DIAGNOSIS — Z3042 Encounter for surveillance of injectable contraceptive: Secondary | ICD-10-CM

## 2014-11-15 MED ORDER — MEDROXYPROGESTERONE ACETATE 150 MG/ML IM SUSP
150.0000 mg | Freq: Once | INTRAMUSCULAR | Status: AC
  Administered 2014-11-15: 150 mg via INTRAMUSCULAR

## 2014-12-02 ENCOUNTER — Other Ambulatory Visit: Payer: Self-pay | Admitting: Physician Assistant

## 2014-12-06 ENCOUNTER — Encounter: Payer: Self-pay | Admitting: Physician Assistant

## 2014-12-06 NOTE — Telephone Encounter (Signed)
Both Rx called [facility]

## 2014-12-17 ENCOUNTER — Ambulatory Visit (INDEPENDENT_AMBULATORY_CARE_PROVIDER_SITE_OTHER): Payer: BLUE CROSS/BLUE SHIELD | Admitting: Physician Assistant

## 2014-12-17 ENCOUNTER — Encounter: Payer: Self-pay | Admitting: Physician Assistant

## 2014-12-17 DIAGNOSIS — I10 Essential (primary) hypertension: Secondary | ICD-10-CM | POA: Diagnosis not present

## 2014-12-17 DIAGNOSIS — M797 Fibromyalgia: Secondary | ICD-10-CM | POA: Diagnosis not present

## 2014-12-17 DIAGNOSIS — Z79899 Other long term (current) drug therapy: Secondary | ICD-10-CM

## 2014-12-17 DIAGNOSIS — R7303 Prediabetes: Secondary | ICD-10-CM | POA: Diagnosis not present

## 2014-12-17 DIAGNOSIS — E559 Vitamin D deficiency, unspecified: Secondary | ICD-10-CM | POA: Diagnosis not present

## 2014-12-17 MED ORDER — TOPIRAMATE 25 MG PO TABS: ORAL_TABLET | ORAL | Status: DC

## 2014-12-17 MED ORDER — LORAZEPAM 2 MG PO TABS
ORAL_TABLET | ORAL | Status: DC
Start: 2014-12-17 — End: 2015-04-01

## 2014-12-17 NOTE — Progress Notes (Signed)
Assessment and Plan:  1. Hypertension -Continue medication, monitor blood pressure at home. Continue DASH diet.  Reminder to go to the ER if any CP, SOB, nausea, dizziness, severe HA, changes vision/speech, left arm numbness and tingling and jaw pain.  2. Cholesterol -Continue diet and exercise. Check cholesterol.   3. Prediabetes  -Continue diet and exercise. Check A1C  4. Vitamin D Def - check level and continue medications.   5. FM Willing to try different medications to decrease tramadol use, will try on topamax at night, will continue to prescribe tramadol, has been on for 7 years without increase or abuse, long discussion that if ever over use will refer to pain management.  Seeing Julieta BelliniPamela Duncan for counseling Will switch lorzaepam to 2 mg to take at night  7. Decreased sex drive On addy and it is helping  Continue diet and meds as discussed. Further disposition pending results of labs. Over 30 minutes of exam, counseling, chart review, and critical decision making was performed  HPI 49 y.o. female  presents for 3 month follow up on hypertension, cholesterol, prediabetes, and vitamin D deficiency.   Her blood pressure has been controlled at home, today their BP is BP: 122/80 mmHg  She does workout. She denies chest pain, shortness of breath, dizziness.  She is not on cholesterol medication and denies myalgias. Her cholesterol is at goal. The cholesterol last visit was:   Lab Results  Component Value Date   CHOL 118* 08/30/2014   HDL 49 08/30/2014   LDLCALC 51 08/30/2014   TRIG 92 08/30/2014   CHOLHDL 2.4 08/30/2014    She has been working on diet and exercise for prediabetes, and denies paresthesia of the feet, polydipsia, polyuria and visual disturbances. Last A1C in the office was:  Lab Results  Component Value Date   HGBA1C 5.8* 08/30/2014   Patient is on Vitamin D supplement.   Lab Results  Component Value Date   VD25OH 30 08/30/2014   She is on tramadol for  FM, takes 4 a day and has been on it for 7 years, has never over used it.  She is tearful this visit, very frustrated with how she is perceived with taking the tramadol. She has tried and failed lyrica and cymbalta.  BMI is Body mass index is 31.94 kg/(m^2)., she is working on diet and exercise. Wt Readings from Last 3 Encounters:  12/17/14 204 lb (92.534 kg)  08/30/14 198 lb 3.2 oz (89.903 kg)  04/13/14 209 lb (94.802 kg)     Current Medications:  Current Outpatient Prescriptions on File Prior to Visit  Medication Sig Dispense Refill  . Aspirin-Acetaminophen-Caffeine (EXCEDRIN EXTRA STRENGTH PO) Take by mouth daily as needed.    Marland Kitchen. augmented betamethasone dipropionate (DIPROLENE-AF) 0.05 % ointment Apply topically daily. 50 g 3  . cyclobenzaprine (FLEXERIL) 10 MG tablet Take 1 tablet (10 mg total) by mouth 2 (two) times daily as needed for muscle spasms. 10 tablet 0  . Flibanserin (ADDYI) 100 MG TABS Take 100 mg by mouth daily. 30 tablet 3  . lisinopril-hydrochlorothiazide (PRINZIDE,ZESTORETIC) 20-12.5 MG per tablet TAKE 1 TABLET BY MOUTH DAILY. 90 tablet 3  . LORazepam (ATIVAN) 1 MG tablet TAKE 1 OR 2 TABLETS AT BEDTIME AS NEEDED FOR SLEEP. 60 tablet 0  . medroxyPROGESTERone (DEPO-PROVERA) 150 MG/ML injection Inject 150 mg into the muscle every 3 (three) months.     . metoCLOPramide (REGLAN) 10 MG tablet TAKE ONE TABLET BY MOUTH ONCE DAILY 90 tablet 0  .  Multiple Vitamins-Minerals (MULTIVITAMIN WITH MINERALS) tablet Take 1 tablet by mouth daily. Bariatric multi vitamin.    . traMADol (ULTRAM) 50 MG tablet TAKE (2) TABLETS TWICE DAILY. 120 tablet 0  . Vitamin D, Ergocalciferol, (DRISDOL) 50000 UNITS CAPS capsule TAKE ONE CAPSULE BY MOUTH TWICE A WEEK 30 capsule 1   No current facility-administered medications on file prior to visit.   Medical History:  Past Medical History  Diagnosis Date  . Hypertension   . IBS (irritable bowel syndrome)   . Arthritis   . Fibromyalgia   .  Nephrolithiasis   . Anemia   . Plantar fasciitis of right foot     wears boot    Allergies: No Known Allergies   Review of Systems:  Review of Systems  Constitutional: Positive for malaise/fatigue. Negative for fever, chills, weight loss and diaphoresis.  Eyes: Negative.   Respiratory: Negative.   Cardiovascular: Negative.   Gastrointestinal: Negative.   Genitourinary: Negative.   Musculoskeletal: Positive for myalgias, back pain, joint pain and neck pain. Negative for falls.  Skin: Negative.   Neurological: Negative.  Negative for weakness.  Psychiatric/Behavioral: Negative.     Family history- Review and unchanged Social history- Review and unchanged Physical Exam: BP 122/80 mmHg  Pulse 67  Temp(Src) 97.9 F (36.6 C) (Temporal)  Resp 16  Ht  (1.702 m)  Wt 204 lb (92.534 kg)  BMI 31.94 kg/m2  SpO2 98% Wt Readings from Last 3 Encounters:  12/17/14 204 lb (92.534 kg)  08/30/14 198 lb 3.2 oz (89.903 kg)  04/13/14 209 lb (94.802 kg)   General Appearance: Well nourished, in no apparent distress. Eyes: PERRLA, EOMs, conjunctiva no swelling or erythema Sinuses: No Frontal/maxillary tenderness ENT/Mouth: Ext aud canals clear, TMs without erythema, bulging. No erythema, swelling, or exudate on post pharynx.  Tonsils not swollen or erythematous. Hearing normal.  Neck: Supple, thyroid normal.  Respiratory: Respiratory effort normal, BS equal bilaterally without rales, rhonchi, wheezing or stridor.  Cardio: RRR with no MRGs. Brisk peripheral pulses without edema.  Abdomen: Soft, + BS,  Non tender, no guarding, rebound, hernias, masses. Lymphatics: Non tender without lymphadenopathy.  Musculoskeletal: Full ROM, 5/5 strength, Normal gait Skin: Warm, dry without rashes, lesions, ecchymosis.  Neuro: Cranial nerves intact. Normal muscle tone, no cerebellar symptoms. Psych: Awake and oriented X 3, normal affect, Insight and Judgment appropriate.    Quentin Mulling, PA-C 11:42  AM [facility]

## 2014-12-17 NOTE — Patient Instructions (Addendum)
1/2 at night an hour before bed for 3-5 days, then can go up to 1 tablet at night for 5 days Can go up to 2 tablets a night if needed for pain At this time I'm hoping you can decrease the tramadol, try the AM or the evening first.    If this does not help or you have side effects we can try nortriptyline at night.    Myofascial Pain Syndrome and Fibromyalgia Myofascial pain syndrome and fibromyalgia are both pain disorders. This pain may be felt mainly in your muscles.   Myofascial pain syndrome:  Always has trigger points or tender points in the muscle that will cause pain when pressed. The pain may come and go.  Usually affects your neck, upper back, and shoulder areas. The pain often radiates into your arms and hands.  Fibromyalgia:  Has muscle pains and tenderness that come and go.  Is often associated with fatigue and sleep disturbances.  Has trigger points.  Tends to be long-lasting (chronic), but is not life-threatening. Fibromyalgia and myofascial pain are not the same. However, they often occur together. If you have both conditions, each can make the other worse. Both are common and can cause enough pain and fatigue to make day-to-day activities difficult.  CAUSES  The exact causes of fibromyalgia and myofascial pain are not known. People with certain gene types may be more likely to develop fibromyalgia. Some factors can be triggers for both conditions, such as:   Spine disorders.  Arthritis.  Severe injury (trauma) and other physical stressors.  Being under a lot of stress.  A medical illness. SIGNS AND SYMPTOMS  Fibromyalgia The main symptom of fibromyalgia is widespread pain and tenderness in your muscles. This can vary over time. Pain is sometimes described as stabbing, shooting, or burning. You may have tingling or numbness, too. You may also have sleep problems and fatigue. You may wake up feeling tired and groggy (fibro fog). Other symptoms may include:    Bowel and bladder problems.  Headaches.  Visual problems.  Problems with odors and noises.  Depression or mood changes.  Painful menstrual periods (dysmenorrhea).  Dry skin or eyes. Myofascial pain syndrome Symptoms of myofascial pain syndrome include:   Tight, ropy bands of muscle.   Uncomfortable sensations in muscular areas, such as:  Aching.  Cramping.  Burning.  Numbness.  Tingling.   Muscle weakness.  Trouble moving certain muscles freely (range of motion). DIAGNOSIS  There are no specific tests to diagnose fibromyalgia or myofascial pain syndrome. Both can be hard to diagnose because their symptoms are common in many other conditions. Your health care provider may suspect one or both of these conditions based on your symptoms and medical history. Your health care provider will also do a physical exam.  The key to diagnosing fibromyalgia is having pain, fatigue, and other symptoms for more than three months that cannot be explained by another condition.  The key to diagnosing myofascial pain syndrome is finding trigger points in muscles that are tender and cause pain elsewhere in your body (referred pain). TREATMENT  Treating fibromyalgia and myofascial pain often requires a team of health care providers. This usually starts with your primary provider and a physical therapist. You may also find it helpful to work with alternative health care providers, such as massage therapists or acupuncturists. Treatment for fibromyalgia may include medicines. This may include nonsteroidal anti-inflammatory drugs (NSAIDs), along with other medicines.  Treatment for myofascial pain may also include:  NSAIDs.  Cooling and stretching of muscles.  Trigger point injections.  Sound wave (ultrasound) treatments to stimulate muscles. HOME CARE INSTRUCTIONS   Take medicines only as directed by your health care provider.  Exercise as directed by your health care provider or  physical therapist.  Try to avoid stressful situations.  Practice relaxation techniques to control your stress. You may want to try:  Biofeedback.  Visual imagery.  Hypnosis.  Muscle relaxation.  Yoga.  Meditation.  Talk to your health care provider about alternative treatments, such as acupuncture or massage treatment.  Maintain a healthy lifestyle. This includes eating a healthy diet and getting enough sleep.  Consider joining a support group.  Do not do activities that stress or strain your muscles. That includes repetitive motions and heavy lifting. SEEK MEDICAL CARE IF:   You have new symptoms.  Your symptoms get worse.  You have side effects from your medicines.  You have trouble sleeping.  Your condition is causing depression or anxiety. FOR MORE INFORMATION   National Fibromyalgia Association: http://www.fmaware.orgwww.fmaware.org  Arthritis Foundation: http://www.arthritis.orgwww.arthritis.org  American Chronic Pain Association: michaeledo.com.CandyDash.co.za   This information is not intended to replace advice given to you by your health care provider. Make sure you discuss any questions you have with your health care provider.   Document Released: 01/01/2005 Document Revised: 01/22/2014 Document Reviewed: 10/07/2013 Elsevier Interactive Patient Education [employer]

## 2015-01-04 ENCOUNTER — Encounter: Payer: Self-pay | Admitting: Physician Assistant

## 2015-01-04 MED ORDER — TRAMADOL HCL 50 MG PO TABS: ORAL_TABLET | ORAL | Status: DC

## 2015-01-04 MED ORDER — TOPIRAMATE 50 MG PO TABS: ORAL_TABLET | ORAL | Status: DC

## 2015-01-05 ENCOUNTER — Other Ambulatory Visit: Payer: Self-pay

## 2015-01-05 DIAGNOSIS — Z1231 Encounter for screening mammogram for malignant neoplasm of breast: Secondary | ICD-10-CM

## 2015-01-31 ENCOUNTER — Ambulatory Visit: Payer: Self-pay

## 2015-02-01 ENCOUNTER — Ambulatory Visit (INDEPENDENT_AMBULATORY_CARE_PROVIDER_SITE_OTHER): Payer: Managed Care, Other (non HMO)

## 2015-02-01 VITALS — Ht 67.0 in | Wt 210.0 lb

## 2015-02-01 DIAGNOSIS — Z3049 Encounter for surveillance of other contraceptives: Secondary | ICD-10-CM

## 2015-02-01 DIAGNOSIS — Z3042 Encounter for surveillance of injectable contraceptive: Secondary | ICD-10-CM

## 2015-02-01 MED ORDER — MEDROXYPROGESTERONE ACETATE 150 MG/ML IM SUSP
150.0000 mg | Freq: Once | INTRAMUSCULAR | Status: AC
  Administered 2015-02-01: 150 mg via INTRAMUSCULAR

## 2015-02-01 NOTE — Progress Notes (Signed)
Pt presents for DEPO injection which was given IM in right glute and pt had no questions or concerns. Pt's next appt is April 4th 2017.

## 2015-02-03 ENCOUNTER — Ambulatory Visit
Admission: RE | Admit: 2015-02-03 | Discharge: 2015-02-03 | Disposition: A | Discharge status: Home / Self Care | Payer: Managed Care, Other (non HMO) | Source: Ambulatory Visit

## 2015-02-03 DIAGNOSIS — Z1231 Encounter for screening mammogram for malignant neoplasm of breast: Secondary | ICD-10-CM

## 2015-02-11 ENCOUNTER — Other Ambulatory Visit: Payer: Self-pay | Admitting: Physician Assistant

## 2015-02-11 ENCOUNTER — Encounter: Payer: Self-pay | Admitting: Physician Assistant

## 2015-02-11 MED ORDER — TOPIRAMATE 100 MG PO TABS: ORAL_TABLET | ORAL | Status: DC

## 2015-02-11 MED ORDER — TRAMADOL HCL 50 MG PO TABS: ORAL_TABLET | ORAL | Status: DC

## 2015-02-14 ENCOUNTER — Encounter: Payer: Self-pay | Admitting: Gastroenterology

## 2015-03-23 ENCOUNTER — Encounter: Payer: Self-pay | Admitting: Physician Assistant

## 2015-03-23 MED ORDER — TRAMADOL HCL 50 MG PO TABS: ORAL_TABLET | ORAL | Status: DC

## 2015-04-01 ENCOUNTER — Encounter: Payer: Self-pay | Admitting: Physician Assistant

## 2015-04-01 MED ORDER — TOPIRAMATE 100 MG PO TABS: ORAL_TABLET | ORAL | Status: DC

## 2015-04-01 MED ORDER — LORAZEPAM 2 MG PO TABS: ORAL_TABLET | ORAL | Status: DC

## 2015-04-19 ENCOUNTER — Ambulatory Visit (INDEPENDENT_AMBULATORY_CARE_PROVIDER_SITE_OTHER): Payer: Managed Care, Other (non HMO) | Admitting: *Deleted

## 2015-04-19 DIAGNOSIS — Z3042 Encounter for surveillance of injectable contraceptive: Secondary | ICD-10-CM | POA: Diagnosis not present

## 2015-04-19 MED ORDER — MEDROXYPROGESTERONE ACETATE 150 MG/ML IM SUSP
150.0000 mg | Freq: Once | INTRAMUSCULAR | Status: AC
  Administered 2015-04-19: 150 mg via INTRAMUSCULAR

## 2015-04-19 NOTE — Progress Notes (Deleted)
Patient ID: Wendy NineLisa A Rolon, female   DOB: [birth year], 50 y.o.   MRN: [id number]

## 2015-05-11 ENCOUNTER — Other Ambulatory Visit: Payer: Self-pay | Admitting: Internal Medicine

## 2015-05-11 ENCOUNTER — Encounter: Payer: Self-pay | Admitting: Physician Assistant

## 2015-05-11 MED ORDER — TRAMADOL HCL 50 MG PO TABS: ORAL_TABLET | ORAL | Status: DC

## 2015-06-29 ENCOUNTER — Encounter: Payer: Self-pay | Admitting: Physician Assistant

## 2015-06-29 MED ORDER — TRAMADOL HCL 50 MG PO TABS: ORAL_TABLET | ORAL | Status: DC

## 2015-07-05 ENCOUNTER — Ambulatory Visit (INDEPENDENT_AMBULATORY_CARE_PROVIDER_SITE_OTHER): Payer: Managed Care, Other (non HMO) | Admitting: *Deleted

## 2015-07-05 DIAGNOSIS — Z3042 Encounter for surveillance of injectable contraceptive: Secondary | ICD-10-CM

## 2015-07-05 MED ORDER — MEDROXYPROGESTERONE ACETATE 150 MG/ML IM SUSP
150.0000 mg | Freq: Once | INTRAMUSCULAR | Status: AC
  Administered 2015-07-05: 150 mg via INTRAMUSCULAR

## 2015-08-08 ENCOUNTER — Telehealth: Payer: Self-pay | Admitting: Physician Assistant

## 2015-08-08 ENCOUNTER — Encounter: Payer: Self-pay | Admitting: Physician Assistant

## 2015-08-08 MED ORDER — TRAMADOL HCL 50 MG PO TABS: ORAL_TABLET | ORAL | 0 refills | Status: DC

## 2015-08-26 ENCOUNTER — Other Ambulatory Visit: Payer: Self-pay | Admitting: Internal Medicine

## 2015-08-30 ENCOUNTER — Encounter: Payer: Self-pay | Admitting: Physician Assistant

## 2015-08-30 ENCOUNTER — Ambulatory Visit (INDEPENDENT_AMBULATORY_CARE_PROVIDER_SITE_OTHER): Payer: Managed Care, Other (non HMO) | Admitting: Physician Assistant

## 2015-08-30 ENCOUNTER — Other Ambulatory Visit: Payer: Self-pay | Admitting: Physician Assistant

## 2015-08-30 VITALS — BP 110/70 | HR 74 | Temp 97.7°F | Resp 16 | Ht 67.0 in | Wt 192.4 lb

## 2015-08-30 DIAGNOSIS — F411 Generalized anxiety disorder: Secondary | ICD-10-CM

## 2015-08-30 DIAGNOSIS — Z79899 Other long term (current) drug therapy: Secondary | ICD-10-CM | POA: Diagnosis not present

## 2015-08-30 DIAGNOSIS — Z0001 Encounter for general adult medical examination with abnormal findings: Secondary | ICD-10-CM | POA: Diagnosis not present

## 2015-08-30 DIAGNOSIS — Z9884 Bariatric surgery status: Secondary | ICD-10-CM

## 2015-08-30 DIAGNOSIS — E538 Deficiency of other specified B group vitamins: Secondary | ICD-10-CM

## 2015-08-30 DIAGNOSIS — E559 Vitamin D deficiency, unspecified: Secondary | ICD-10-CM

## 2015-08-30 DIAGNOSIS — Z13220 Encounter for screening for lipoid disorders: Secondary | ICD-10-CM | POA: Diagnosis not present

## 2015-08-30 DIAGNOSIS — R7303 Prediabetes: Secondary | ICD-10-CM | POA: Diagnosis not present

## 2015-08-30 DIAGNOSIS — D649 Anemia, unspecified: Secondary | ICD-10-CM

## 2015-08-30 DIAGNOSIS — Z720 Tobacco use: Secondary | ICD-10-CM

## 2015-08-30 DIAGNOSIS — I10 Essential (primary) hypertension: Secondary | ICD-10-CM

## 2015-08-30 DIAGNOSIS — R748 Abnormal levels of other serum enzymes: Secondary | ICD-10-CM

## 2015-08-30 DIAGNOSIS — M797 Fibromyalgia: Secondary | ICD-10-CM

## 2015-08-30 DIAGNOSIS — G4700 Insomnia, unspecified: Secondary | ICD-10-CM | POA: Diagnosis not present

## 2015-08-30 DIAGNOSIS — R6889 Other general symptoms and signs: Secondary | ICD-10-CM

## 2015-08-30 LAB — CBC WITH DIFFERENTIAL/PLATELET
BASOS ABS: 0 {cells}/uL (ref 0–200)
BASOS PCT: 0 %
EOS ABS: 55 {cells}/uL (ref 15–500)
EOS PCT: 1 %
HEMATOCRIT: 42.9 % (ref 35.0–45.0)
HEMOGLOBIN: 14.1 g/dL (ref 11.7–15.5)
LYMPHS ABS: 1705 {cells}/uL (ref 850–3900)
Lymphocytes Relative: 31 %
MCH: 30.2 pg (ref 27.0–33.0)
MCHC: 32.9 g/dL (ref 32.0–36.0)
MCV: 91.9 fL (ref 80.0–100.0)
MPV: 9.7 fL (ref 7.5–12.5)
Monocytes Absolute: 440 cells/uL (ref 200–950)
Monocytes Relative: 8 %
NEUTROS ABS: 3300 {cells}/uL (ref 1500–7800)
Neutrophils Relative %: 60 %
Platelets: 323 10*3/uL (ref 140–400)
RBC: 4.67 MIL/uL (ref 3.80–5.10)
RDW: 13.7 % (ref 11.0–15.0)
WBC: 5.5 10*3/uL (ref 3.8–10.8)

## 2015-08-30 MED ORDER — LORAZEPAM 2 MG PO TABS: ORAL_TABLET | ORAL | 1 refills | Status: DC

## 2015-08-30 NOTE — Progress Notes (Signed)
Complete Physical  Assessment and Plan: Essential hypertension - continue medications, DASH diet, exercise and monitor at home. Call if greater than 130/80.  - CBC with Differential/Platelet - BASIC METABOLIC PANEL WITH GFR - Hepatic function panel - TSH - Urinalysis, Routine w reflex microscopic (not at [facility]) - Microalbumin / creatinine urine ratio - EKG 12-Lead  Prediabetes Discussed general issues about diabetes pathophysiology and management., Educational material distributed., Suggested low cholesterol diet., Encouraged aerobic exercise., Discussed foot care., Reminded to get yearly retinal exam. - Hemoglobin A1c - Lipid panel - Insulin, fasting  Morbid obesity Obesity with co morbidities- long discussion about weight loss, diet, and exercise  Tobacco abuse Smoking cessation-  instruction/counseling given, counseled patient on the dangers of tobacco use, advised patient to stop smoking, and reviewed strategies to maximize success, patient not ready to quit at this time.   Medication management - Magnesium  Vitamin D deficiency - Vit D  25 hydroxy (rtn osteoporosis monitoring)  Insomnia  good sleep hygiene discussed, increase day time activity  Anxiety state continue medications, stress management techniques discussed, increase water, good sleep hygiene discussed, increase exercise, and increase veggies.   Fibromyalgia Monitor, discuss tramadol use, if ever uses more will send to pain management, understands.   S/P gastric bypass Patient is s/p gastric bypass- Check labs to do to screen for vitamin deficiencies associated with gastric bypass including Vitamin D, B12, iron. Recommend strict diet and exercise.  - Iron and TIBC - Ferritin - Vitamin B12 - Folate  Elevated liver enzymes Will monitor, avoid tylenol  Encounter for general adult medical examination with abnormal findings Up to date MGM, colonoscopy, and pap   Discussed med's effects and SE's.  Screening labs and tests as requested with regular follow-up as recommended. Over 40 minutes of exam, counseling, chart review, and complex, high level critical decision making was performed this visit.   HPI  50 y.o. female  presents for a complete physical.  Her blood pressure has been controlled at home, today their BP is BP: 110/70 She does workout, doing kick boxing at the gym. She denies chest pain, shortness of breath, dizziness. Has been having fatigue after eating lunch x 1 hour.  She is not on cholesterol medication and denies myalgias. Her cholesterol is at goal. The cholesterol last visit was:   Lab Results  Component Value Date   CHOL 118 (L) 08/30/2014   HDL 49 08/30/2014   LDLCALC 51 08/30/2014   TRIG 92 08/30/2014   CHOLHDL 2.4 08/30/2014   She has been working on diet and exercise for prediabetes, and denies paresthesia of the feet, polydipsia, polyuria and visual disturbances. Last A1C in the office was:  Lab Results  Component Value Date   HGBA1C 5.8 (H) 08/30/2014   Patient is on Vitamin D supplement.   Lab Results  Component Value Date   VD25OH 30 08/30/2014     BMI is Body mass index is 30.13 kg/m., she is working on diet and exercise s/p gastric bypass Sept 2013.  Wt Readings from Last 3 Encounters:  08/30/15 192 lb 6.4 oz (87.3 kg)  02/01/15 210 lb (95.3 kg)  12/17/14 204 lb (92.5 kg)   She has FM and takes tramadol for this, will take 1.5 tablets 3 times a day and occ another at night. She has had elevated alk phos with normal Ab [place], GGT and bone scan in 2001. Feels that the topamax is not helping with the pain.  Has had negative autoimmune workup in  the past.  She had a normal DEXA 09/2013, has been on depo for 12 years. She does smoke, has cut back, goal is to quit when she is 51.   Is going through menopause and states she is having a hard time, very moody, decreased sexual drive.  She takes 2 mg of ativan for sleep.    Current Medications:   Current Outpatient Prescriptions on File Prior to Visit  Medication Sig Dispense Refill  . Aspirin-Acetaminophen-Caffeine (EXCEDRIN EXTRA STRENGTH PO) Take by mouth daily as needed.    Marland Kitchen augmented betamethasone dipropionate (DIPROLENE-AF) 0.05 % ointment Apply topically daily. 50 g 3  . cyclobenzaprine (FLEXERIL) 10 MG tablet Take 1 tablet (10 mg total) by mouth 2 (two) times daily as needed for muscle spasms. 10 tablet 0  . lisinopril-hydrochlorothiazide (PRINZIDE,ZESTORETIC) 20-12.5 MG tablet TAKE 1 TABLET BY MOUTH DAILY. 90 tablet 0  . LORazepam (ATIVAN) 2 MG tablet TAKE 1 TABLETS AT BEDTIME AS NEEDED FOR SLEEP. 90 tablet 1  . medroxyPROGESTERone (DEPO-PROVERA) 150 MG/ML injection Inject 150 mg into the muscle every 3 (three) months.     . metoCLOPramide (REGLAN) 10 MG tablet TAKE ONE TABLET BY MOUTH ONCE DAILY 90 tablet 0  . Multiple Vitamins-Minerals (MULTIVITAMIN WITH MINERALS) tablet Take 1 tablet by mouth daily. Bariatric multi vitamin.    Marland Kitchen topiramate (TOPAMAX) 100 MG tablet 2 nightly 180 tablet 1  . traMADol (ULTRAM) 50 MG tablet TAKE (2) TABLETS TWICE DAILY. 120 tablet 0  . Vitamin D, Ergocalciferol, (DRISDOL) 50000 UNITS CAPS capsule TAKE ONE CAPSULE BY MOUTH TWICE A WEEK 30 capsule 1   No current facility-administered medications on file prior to visit.    Health Maintenance:   Immunization History  Administered Date(s) Administered  . Influenza Split 10/02/2011, 10/16/2013, 11/15/2014  . Pneumococcal Polysaccharide-23 10/02/2011   Tetanus: 2008 Pneumovax: 2013 Prevnar 13: N/A Flu vaccine: 2016 Zostavax: N/A LMP: post menopausal Pap: 02/2013 MGM:  01/2015 DEXA: 09/2013  Colonoscopy: 06/2011 normal EGD: N/A Myoview stress test 10/2010  Last Dental Exam: Dr. Quincy Simmonds Last Eye Exam: Dr Randall Hiss DERM Dr. Raquel Sarna, OB/GYN Patient Care Team: Unk Pinto, MD as PCP - General (Internal Medicine) Bryson Ha Himmelrich, RD (Inactive) as Dietitian (Bariatrics) Ladene Artist, MD as Consulting Physician (Gastroenterology)  Allergies: No Known Allergies Medical History:  Past Medical History:  Diagnosis Date  . Anemia   . Arthritis   . Fibromyalgia   . Hypertension   . IBS (irritable bowel syndrome)   . Nephrolithiasis   . Plantar fasciitis of right foot    wears boot    Surgical History:  Past Surgical History:  Procedure Laterality Date  . BREATH TEK H PYLORI  07/11/2011   Procedure: BREATH TEK H PYLORI;  Surgeon: Edward Jolly, MD;  Location: [facility];  Service: General;  Laterality: N/A;  . CHOLECYSTECTOMY  10/01/2011   Procedure: LAPAROSCOPIC CHOLECYSTECTOMY WITH INTRAOPERATIVE CHOLANGIOGRAM;  Surgeon: Edward Jolly, MD;  Location: [facility];  Service: General;  Laterality: N/A;  . GASTRIC ROUX-EN-Y  10/01/2011   Procedure: LAPAROSCOPIC ROUX-EN-Y GASTRIC;  Surgeon: Edward Jolly, MD;  Location: [facility];  Service: General;  Laterality: N/A;  . Left shoulder arthroscopic surgery  yrs ago  . tummy tuck  2005   Family History:  Family History  Problem Relation Age of Onset  . Heart attack Maternal Grandfather   . Heart disease Maternal Grandfather   . Heart attack Paternal Grandfather   . Breast cancer Mother 11  breast  . Cancer Mother 39    breast/ ovarian  . Obesity Brother   . Breast cancer Maternal Aunt 39    died at age 19  . Cancer Maternal Aunt     breast  . Multiple sclerosis Maternal Uncle   . Lung cancer Maternal Grandmother   . Cancer Maternal Grandmother     ovarian  . Heart disease Father   . Hypertension Father   . Stroke Father    Social History:  Social History  Substance Use Topics  . Smoking status: Current Every Day Smoker    Packs/day: 0.25    Years: 7.00    Types: Cigarettes  . Smokeless tobacco: Never Used  . Alcohol use 0.6 oz/week    1 Glasses of wine per week     Comment: 1x week    Review of Systems: Review of Systems  Constitutional: Positive for malaise/fatigue. Negative  for chills, diaphoresis, fever and weight loss.  Eyes: Negative.   Respiratory: Negative.   Cardiovascular: Negative.   Gastrointestinal: Negative.   Genitourinary: Negative.   Musculoskeletal: Positive for back pain, joint pain, myalgias and neck pain. Negative for falls.  Skin: Negative.   Neurological: Negative.  Negative for weakness.  Psychiatric/Behavioral: Negative.     Physical Exam: Estimated body mass index is 30.13 kg/m as calculated from the following:   Height as of this encounter: '5\' 7"'  (1.702 m).   Weight as of this encounter: 192 lb 6.4 oz (87.3 kg). BP 110/70   Pulse 74   Temp 97.7 F (36.5 C)   Resp 16   Ht '5\' 7"'  (1.702 m)   Wt 192 lb 6.4 oz (87.3 kg)   SpO2 98%   BMI 30.13 kg/m  General Appearance: Well nourished, in no apparent distress.  Eyes: PERRLA, EOMs, conjunctiva no swelling or erythema, normal fundi and vessels.  Sinuses: No Frontal/maxillary tenderness  ENT/Mouth: Ext aud canals clear, normal light reflex with TMs without erythema, bulging. Good dentition. No erythema, swelling, or exudate on post pharynx. Tonsils not swollen or erythematous. Hearing normal.  Neck: Supple, thyroid normal. No bruits  Respiratory: Respiratory effort normal, BS equal bilaterally without rales, rhonchi, wheezing or stridor.  Cardio: RRR without murmurs, rubs or gallops. Brisk peripheral pulses without edema.  Chest: symmetric, with normal excursions and percussion.  Breasts: defer  Abdomen: Soft, nontender, no guarding, rebound, hernias, masses, or organomegaly.  Lymphatics: Non tender without lymphadenopathy.  Genitourinary: defer Musculoskeletal: Full ROM all peripheral extremities,5/5 strength, and normal gait.  Skin: Warm, dry without rashes, lesions, ecchymosis. Neuro: Cranial nerves intact, reflexes equal bilaterally. Normal muscle tone, no cerebellar symptoms. Sensation intact.  Psych: Awake and oriented X 3, normal affect, Insight and Judgment appropriate.    EKG: declines AORTA SCAN: defer   Vicie Mutters 9:18 AM [facility]

## 2015-08-30 NOTE — Patient Instructions (Addendum)
Tumeric with black pepper extract is a great natural antiinflammatory that helps with arthritis and aches and pain. Can get from [employer] or any health food store. Need to take at least 876m twice a day with food.   Preventive Care for Adults  A healthy lifestyle and preventive care can promote health and wellness. Preventive health guidelines for women include the following key practices.  A routine yearly physical is a good way to check with your health care provider about your health and preventive screening. It is a chance to share any concerns and updates on your health and to receive a thorough exam.  Visit your dentist for a routine exam and preventive care every 6 months. Brush your teeth twice a day and floss once a day. Good oral hygiene prevents tooth decay and gum disease.  The frequency of eye exams is based on your age, health, family medical history, use of contact lenses, and other factors. Follow your health care provider's recommendations for frequency of eye exams.  Eat a healthy diet. Foods like vegetables, fruits, whole grains, low-fat dairy products, and lean protein foods contain the nutrients you need without too many calories. Decrease your intake of foods high in solid fats, added sugars, and salt. Eat the right amount of calories for you.Get information about a proper diet from your health care provider, if necessary.  Regular physical exercise is one of the most important things you can do for your health. Most adults should get at least 150 minutes of moderate-intensity exercise (any activity that increases your heart rate and causes you to sweat) each week. In addition, most adults need muscle-strengthening exercises on 2 or more days a week.  Maintain a healthy weight. The body mass index (BMI) is a screening tool to identify possible weight problems. It provides an estimate of body fat based on height and weight. Your health care provider can find your BMI and can help  you achieve or maintain a healthy weight.For adults 20 years and older:  A BMI below 18.5 is considered underweight.  A BMI of 18.5 to 24.9 is normal.  A BMI of 25 to 29.9 is considered overweight.  A BMI of 30 and above is considered obese.  Maintain normal blood lipids and cholesterol levels by exercising and minimizing your intake of saturated fat. Eat a balanced diet with plenty of fruit and vegetables. Blood tests for lipids and cholesterol should begin at age 3072and be repeated every 5 years. If your lipid or cholesterol levels are high, you are over 50, or you are at high risk for heart disease, you may need your cholesterol levels checked more frequently.Ongoing high lipid and cholesterol levels should be treated with medicines if diet and exercise are not working.  If you smoke, find out from your health care provider how to quit. If you do not use tobacco, do not start.  Lung cancer screening is recommended for adults aged 548-80years who are at high risk for developing lung cancer because of a history of smoking. A yearly low-dose CT scan of the lungs is recommended for people who have at least a 30-pack-year history of smoking and are a current smoker or have quit within the past 15 years. A pack year of smoking is smoking an average of 1 pack of cigarettes a day for 1 year (for example: 1 pack a day for 30 years or 2 packs a day for 15 years). Yearly screening should continue until the smoker has  stopped smoking for at least 15 years. Yearly screening should be stopped for people who develop a health problem that would prevent them from having lung cancer treatment.  High blood pressure causes heart disease and increases the risk of stroke. Your blood pressure should be checked at least every 1 to 2 years. Ongoing high blood pressure should be treated with medicines if weight loss and exercise do not work.  If you are 18-44 years old, ask your health care provider if you should take  aspirin to prevent strokes.  Diabetes screening involves taking a blood sample to check your fasting blood sugar level. This should be done once every 3 years, after age 5, if you are within normal weight and without risk factors for diabetes. Testing should be considered at a younger age or be carried out more frequently if you are overweight and have at least 1 risk factor for diabetes.  Breast cancer screening is essential preventive care for women. You should practice "breast self-awareness." This means understanding the normal appearance and feel of your breasts and may include breast self-examination. Any changes detected, no matter how small, should be reported to a health care provider. Women in their 23s and 30s should have a clinical breast exam (CBE) by a health care provider as part of a regular health exam every 1 to 3 years. After age 8, women should have a CBE every year. Starting at age 60, women should consider having a mammogram (breast X-ray test) every year. Women who have a family history of breast cancer should talk to their health care provider about genetic screening. Women at a high risk of breast cancer should talk to their health care providers about having an MRI and a mammogram every year.  Breast cancer gene (BRCA)-related cancer risk assessment is recommended for women who have family members with BRCA-related cancers. BRCA-related cancers include breast, ovarian, tubal, and peritoneal cancers. Having family members with these cancers may be associated with an increased risk for harmful changes (mutations) in the breast cancer genes BRCA1 and BRCA2. Results of the assessment will determine the need for genetic counseling and BRCA1 and BRCA2 testing.  Routine pelvic exams to screen for cancer are no longer recommended for nonpregnant women who are considered low risk for cancer of the pelvic organs (ovaries, uterus, and vagina) and who do not have symptoms. Ask your health  care provider if a screening pelvic exam is right for you.  If you have had past treatment for cervical cancer or a condition that could lead to cancer, you need Pap tests and screening for cancer for at least 20 years after your treatment. If Pap tests have been discontinued, your risk factors (such as having a new sexual partner) need to be reassessed to determine if screening should be resumed. Some women have medical problems that increase the chance of getting cervical cancer. In these cases, your health care provider may recommend more frequent screening and Pap tests.  Colorectal cancer can be detected and often prevented. Most routine colorectal cancer screening begins at the age of 35 years and continues through age 58 years. However, your health care provider may recommend screening at an earlier age if you have risk factors for colon cancer. On a yearly basis, your health care provider may provide home test kits to check for hidden blood in the stool. Use of a small camera at the end of a tube, to directly examine the colon (sigmoidoscopy or colonoscopy), can detect the  earliest forms of colorectal cancer. Talk to your health care provider about this at age 1, when routine screening begins. Direct exam of the colon should be repeated every 5-10 years through age 3 years, unless early forms of pre-cancerous polyps or small growths are found.  Hepatitis C blood testing is recommended for all people born from [birth year] through 1965 and any individual with known risks for hepatitis C.  Pra  Osteoporosis is a disease in which the bones lose minerals and strength with aging. This can result in serious bone fractures or breaks. The risk of osteoporosis can be identified using a bone density scan. Women ages 72 years and over and women at risk for fractures or osteoporosis should discuss screening with their health care providers. Ask your health care provider whether you should take a calcium supplement  or vitamin D to reduce the rate of osteoporosis.  Menopause can be associated with physical symptoms and risks. Hormone replacement therapy is available to decrease symptoms and risks. You should talk to your health care provider about whether hormone replacement therapy is right for you.  Use sunscreen. Apply sunscreen liberally and repeatedly throughout the day. You should seek shade when your shadow is shorter than you. Protect yourself by wearing long sleeves, pants, a wide-brimmed hat, and sunglasses year round, whenever you are outdoors.  Once a month, do a whole body skin exam, using a mirror to look at the skin on your back. Tell your health care provider of new moles, moles that have irregular borders, moles that are larger than a pencil eraser, or moles that have changed in shape or color.  Stay current with required vaccines (immunizations).  Influenza vaccine. All adults should be immunized every year.  Tetanus, diphtheria, and acellular pertussis (Td, Tdap) vaccine. Pregnant women should receive 1 dose of Tdap vaccine during each pregnancy. The dose should be obtained regardless of the length of time since the last dose. Immunization is preferred during the 27th-36th week of gestation. An adult who has not previously received Tdap or who does not know her vaccine status should receive 1 dose of Tdap. This initial dose should be followed by tetanus and diphtheria toxoids (Td) booster doses every 10 years. Adults with an unknown or incomplete history of completing a 3-dose immunization series with Td-containing vaccines should begin or complete a primary immunization series including a Tdap dose. Adults should receive a Td booster every 10 years.  Varicella vaccine. An adult without evidence of immunity to varicella should receive 2 doses or a second dose if she has previously received 1 dose. Pregnant females who do not have evidence of immunity should receive the first dose after  pregnancy. This first dose should be obtained before leaving the health care facility. The second dose should be obtained 4-8 weeks after the first dose.  Human papillomavirus (HPV) vaccine. Females aged 13-26 years who have not received the vaccine previously should obtain the 3-dose series. The vaccine is not recommended for use in pregnant females. However, pregnancy testing is not needed before receiving a dose. If a female is found to be pregnant after receiving a dose, no treatment is needed. In that case, the remaining doses should be delayed until after the pregnancy. Immunization is recommended for any person with an immunocompromised condition through the age of 74 years if she did not get any or all doses earlier. During the 3-dose series, the second dose should be obtained 4-8 weeks after the first dose. The third dose  should be obtained 24 weeks after the first dose and 16 weeks after the second dose.  Zoster vaccine. One dose is recommended for adults aged 49 years or older unless certain conditions are present.  Measles, mumps, and rubella (MMR) vaccine. Adults born before 5 generally are considered immune to measles and mumps. Adults born in [birth year] or later should have 1 or more doses of MMR vaccine unless there is a contraindication to the vaccine or there is laboratory evidence of immunity to each of the three diseases. A routine second dose of MMR vaccine should be obtained at least 28 days after the first dose for students attending postsecondary schools, health care workers, or international travelers. People who received inactivated measles vaccine or an unknown type of measles vaccine during 1963-1967 should receive 2 doses of MMR vaccine. People who received inactivated mumps vaccine or an unknown type of mumps vaccine before 1979 and are at high risk for mumps infection should consider immunization with 2 doses of MMR vaccine. For females of childbearing age, rubella immunity should  be determined. If there is no evidence of immunity, females who are not pregnant should be vaccinated. If there is no evidence of immunity, females who are pregnant should delay immunization until after pregnancy. Unvaccinated health care workers born before [birth year] who lack laboratory evidence of measles, mumps, or rubella immunity or laboratory confirmation of disease should consider measles and mumps immunization with 2 doses of MMR vaccine or rubella immunization with 1 dose of MMR vaccine.  Pneumococcal 13-valent conjugate (PCV13) vaccine. When indicated, a person who is uncertain of her immunization history and has no record of immunization should receive the PCV13 vaccine. An adult aged 56 years or older who has certain medical conditions and has not been previously immunized should receive 1 dose of PCV13 vaccine. This PCV13 should be followed with a dose of pneumococcal polysaccharide (PPSV23) vaccine. The PPSV23 vaccine dose should be obtained at least 8 weeks after the dose of PCV13 vaccine. An adult aged 38 years or older who has certain medical conditions and previously received 1 or more doses of PPSV23 vaccine should receive 1 dose of PCV13. The PCV13 vaccine dose should be obtained 1 or more years after the last PPSV23 vaccine dose.    Pneumococcal polysaccharide (PPSV23) vaccine. When PCV13 is also indicated, PCV13 should be obtained first. All adults aged 74 years and older should be immunized. An adult younger than age 40 years who has certain medical conditions should be immunized. Any person who resides in a nursing home or long-term care facility should be immunized. An adult smoker should be immunized. People with an immunocompromised condition and certain other conditions should receive both PCV13 and PPSV23 vaccines. People with human immunodeficiency virus (HIV) infection should be immunized as soon as possible after diagnosis. Immunization during chemotherapy or radiation therapy should  be avoided. Routine use of PPSV23 vaccine is not recommended for American Indians, [place] Natives, or people younger than 65 years unless there are medical conditions that require PPSV23 vaccine. When indicated, people who have unknown immunization and have no record of immunization should receive PPSV23 vaccine. One-time revaccination 5 years after the first dose of PPSV23 is recommended for people aged 19-64 years who have chronic kidney failure, nephrotic syndrome, asplenia, or immunocompromised conditions. People who received 1-2 doses of PPSV23 before age 42 years should receive another dose of PPSV23 vaccine at age 77 years or later if at least 5 years have passed since the previous dose.  Doses of PPSV23 are not needed for people immunized with PPSV23 at or after age 51 years.  Preventive Services / Frequency   Ages 93 to 54 years  Blood pressure check.  Lipid and cholesterol check.  Lung cancer screening. / Every year if you are aged 41-80 years and have a 30-pack-year history of smoking and currently smoke or have quit within the past 15 years. Yearly screening is stopped once you have quit smoking for at least 15 years or develop a health problem that would prevent you from having lung cancer treatment.  Clinical breast exam.** / Every year after age 65 years.  BRCA-related cancer risk assessment.** / For women who have family members with a BRCA-related cancer (breast, ovarian, tubal, or peritoneal cancers).  Mammogram.** / Every year beginning at age 62 years and continuing for as long as you are in good health. Consult with your health care provider.  Pap test.** / Every 3 years starting at age 85 years through age 64 or 41 years with a history of 3 consecutive normal Pap tests.  HPV screening.** / Every 3 years from ages 16 years through ages 31 to 49 years with a history of 3 consecutive normal Pap tests.  Fecal occult blood test (FOBT) of stool. / Every year beginning at age 49  years and continuing until age 42 years. You may not need to do this test if you get a colonoscopy every 10 years.  Flexible sigmoidoscopy or colonoscopy.** / Every 5 years for a flexible sigmoidoscopy or every 10 years for a colonoscopy beginning at age 65 years and continuing until age 63 years.  Hepatitis C blood test.** / For all people born from [birth year] through 1965 and any individual with known risks for hepatitis C.  Skin self-exam. / Monthly.  Influenza vaccine. / Every year.  Tetanus, diphtheria, and acellular pertussis (Tdap/Td) vaccine.** / Consult your health care provider. Pregnant women should receive 1 dose of Tdap vaccine during each pregnancy. 1 dose of Td every 10 years.  Varicella vaccine.** / Consult your health care provider. Pregnant females who do not have evidence of immunity should receive the first dose after pregnancy.  Zoster vaccine.** / 1 dose for adults aged 70 years or older.  Pneumococcal 13-valent conjugate (PCV13) vaccine.** / Consult your health care provider.  Pneumococcal polysaccharide (PPSV23) vaccine.** / 1 to 2 doses if you smoke cigarettes or if you have certain conditions.  Meningococcal vaccine.** / Consult your health care provider.  Hepatitis A vaccine.** / Consult your health care provider.  Hepatitis B vaccine.** / Consult your health care provider. Screening for abdominal aortic aneurysm (AAA)  by ultrasound is recommended for people over 50 who have history of high blood pressure or who are current or former smokers.

## 2015-08-31 LAB — BASIC METABOLIC PANEL WITHOUT GFR
BUN: 12 mg/dL (ref 7–25)
CO2: 22 mmol/L (ref 20–31)
Calcium: 9.2 mg/dL (ref 8.6–10.2)
Chloride: 110 mmol/L (ref 98–110)
Creat: 0.69 mg/dL (ref 0.50–1.10)
GFR, Est African American: >89 mL/min (ref >=60)
GFR, Est Non African American: >89 mL/min (ref >=60)
Glucose, Bld: 90 mg/dL (ref 65–99)
Potassium: 3.6 mmol/L (ref 3.5–5.3)
Sodium: 141 mmol/L (ref 135–146)

## 2015-08-31 LAB — LIPID PANEL
Cholesterol: 114 mg/dL — ABNORMAL LOW (ref 125–200)
HDL: 57 mg/dL (ref >=46)
LDL Cholesterol: 45 mg/dL (ref <130)
Total CHOL/HDL Ratio: 2 ratio (ref <=5.0)
Triglycerides: 62 mg/dL (ref <150)
VLDL: 12 mg/dL (ref <30)

## 2015-08-31 LAB — MAGNESIUM: MAGNESIUM: 2 mg/dL (ref 1.5–2.5)

## 2015-08-31 LAB — HEPATIC FUNCTION PANEL
ALT: 12 U/L (ref 6–29)
AST: 15 U/L (ref 10–35)
Albumin: 4.1 g/dL (ref 3.6–5.1)
Alkaline Phosphatase: 107 U/L (ref 33–115)
Bilirubin, Direct: 0.1 mg/dL (ref <=0.2)
Indirect Bilirubin: 0.3 mg/dL (ref 0.2–1.2)
Total Bilirubin: 0.4 mg/dL (ref 0.2–1.2)
Total Protein: 6.7 g/dL (ref 6.1–8.1)

## 2015-08-31 LAB — URINALYSIS, ROUTINE W REFLEX MICROSCOPIC
BILIRUBIN URINE: NEGATIVE
GLUCOSE, UA: NEGATIVE
Hgb urine dipstick: NEGATIVE
KETONES UR: NEGATIVE
Leukocytes, UA: NEGATIVE
Nitrite: NEGATIVE
PH: 5 (ref 5.0–8.0)
Protein, ur: NEGATIVE
SPECIFIC GRAVITY, URINE: 1.021 (ref 1.001–1.035)

## 2015-08-31 LAB — IRON AND TIBC
%SAT: 29 % (ref 11–50)
Iron: 101 ug/dL (ref 40–190)
TIBC: 343 ug/dL (ref 250–450)
UIBC: 242 ug/dL (ref 125–400)

## 2015-08-31 LAB — INSULIN, FASTING: Insulin fasting, serum: 8.7 u[IU]/mL (ref 2.0–19.6)

## 2015-08-31 LAB — FERRITIN: Ferritin: 11 ng/mL (ref 10–232)

## 2015-08-31 LAB — HEMOGLOBIN A1C
HEMOGLOBIN A1C: 5.3 % (ref <5.7)
MEAN PLASMA GLUCOSE: 105 mg/dL

## 2015-08-31 LAB — MICROALBUMIN / CREATININE URINE RATIO
Creatinine, Urine: 138 mg/dL (ref 20–320)
MICROALB UR: 0.5 mg/dL
Microalb Creat Ratio: 4 mcg/mg creat (ref <30)

## 2015-08-31 LAB — TSH: TSH: 1.02 m[IU]/L

## 2015-08-31 LAB — VITAMIN B12: Vitamin B-12: 519 pg/mL (ref 200–1100)

## 2015-08-31 LAB — VITAMIN D 25 HYDROXY (VIT D DEFICIENCY, FRACTURES): Vit D, 25-Hydroxy: 51 ng/mL (ref 30–100)

## 2015-09-01 ENCOUNTER — Other Ambulatory Visit: Payer: Self-pay | Admitting: Internal Medicine

## 2015-09-01 LAB — FOLATE RBC

## 2015-09-02 LAB — FOLLICLE STIMULATING HORMONE: FSH: 18.6 m[IU]/mL

## 2015-09-12 ENCOUNTER — Other Ambulatory Visit: Payer: Self-pay | Admitting: Physician Assistant

## 2015-09-12 ENCOUNTER — Encounter: Payer: Self-pay | Admitting: Physician Assistant

## 2015-09-12 MED ORDER — TRAMADOL HCL 50 MG PO TABS: ORAL_TABLET | ORAL | 0 refills | Status: DC

## 2015-09-20 ENCOUNTER — Ambulatory Visit (INDEPENDENT_AMBULATORY_CARE_PROVIDER_SITE_OTHER): Payer: Managed Care, Other (non HMO) | Admitting: *Deleted

## 2015-09-20 DIAGNOSIS — Z3042 Encounter for surveillance of injectable contraceptive: Secondary | ICD-10-CM

## 2015-09-20 MED ORDER — MEDROXYPROGESTERONE ACETATE 150 MG/ML IM SUSP
150.0000 mg | Freq: Once | INTRAMUSCULAR | Status: AC
  Administered 2015-09-20: 150 mg via INTRAMUSCULAR

## 2015-09-20 NOTE — Progress Notes (Signed)
Patient presents for Depo Provera injection.  Patient received Depo Provera 150 mg IM right glut and tolerated well.

## 2015-09-22 ENCOUNTER — Other Ambulatory Visit: Payer: Self-pay | Admitting: Physician Assistant

## 2015-09-30 ENCOUNTER — Other Ambulatory Visit: Payer: Self-pay | Admitting: Physician Assistant

## 2015-09-30 NOTE — Telephone Encounter (Signed)
RX CALLED INTO [facility]. 

## 2015-10-12 ENCOUNTER — Other Ambulatory Visit: Payer: Self-pay | Admitting: Physician Assistant

## 2015-10-12 MED ORDER — TRAMADOL HCL 50 MG PO TABS: ORAL_TABLET | ORAL | 0 refills | Status: DC

## 2015-11-10 ENCOUNTER — Telehealth: Payer: Self-pay

## 2015-11-10 ENCOUNTER — Other Ambulatory Visit: Payer: Self-pay | Admitting: Physician Assistant

## 2015-11-10 MED ORDER — TRAMADOL HCL 50 MG PO TABS: ORAL_TABLET | ORAL | 0 refills | Status: DC

## 2015-11-10 NOTE — Telephone Encounter (Signed)
Rx called into pharmacy Tramadol

## 2015-11-25 ENCOUNTER — Other Ambulatory Visit: Payer: Self-pay | Admitting: Internal Medicine

## 2015-12-05 ENCOUNTER — Encounter (HOSPITAL_COMMUNITY): Payer: Self-pay

## 2015-12-06 ENCOUNTER — Other Ambulatory Visit: Payer: Self-pay | Admitting: *Deleted

## 2015-12-06 ENCOUNTER — Ambulatory Visit (INDEPENDENT_AMBULATORY_CARE_PROVIDER_SITE_OTHER): Payer: Managed Care, Other (non HMO) | Admitting: *Deleted

## 2015-12-06 DIAGNOSIS — Z3042 Encounter for surveillance of injectable contraceptive: Secondary | ICD-10-CM

## 2015-12-06 MED ORDER — TRAMADOL HCL 50 MG PO TABS: ORAL_TABLET | ORAL | 0 refills | Status: DC

## 2015-12-06 MED ORDER — MEDROXYPROGESTERONE ACETATE 150 MG/ML IM SUSP
150.0000 mg | Freq: Once | INTRAMUSCULAR | Status: AC
Start: 2015-12-06 — End: 2015-12-06
  Administered 2015-12-06: 150 mg via INTRAMUSCULAR

## 2016-01-05 ENCOUNTER — Encounter: Payer: Self-pay | Admitting: Physician Assistant

## 2016-01-05 ENCOUNTER — Other Ambulatory Visit: Payer: Self-pay | Admitting: Physician Assistant

## 2016-01-06 MED ORDER — TRAMADOL HCL 50 MG PO TABS: ORAL_TABLET | ORAL | 0 refills | Status: DC

## 2016-01-06 NOTE — Telephone Encounter (Signed)
Tramadol was called into pharmacy  

## 2016-02-03 ENCOUNTER — Encounter: Payer: Self-pay | Admitting: Physician Assistant

## 2016-02-03 ENCOUNTER — Other Ambulatory Visit: Payer: Self-pay | Admitting: Physician Assistant

## 2016-02-03 MED ORDER — TRAMADOL HCL 50 MG PO TABS: ORAL_TABLET | ORAL | 0 refills | Status: DC

## 2016-02-03 NOTE — Telephone Encounter (Signed)
TRAMADOL WAS CALLED INTO PHARMACY 

## 2016-02-14 ENCOUNTER — Other Ambulatory Visit: Payer: Self-pay | Admitting: Physician Assistant

## 2016-02-14 DIAGNOSIS — Z1231 Encounter for screening mammogram for malignant neoplasm of breast: Secondary | ICD-10-CM

## 2016-02-21 ENCOUNTER — Ambulatory Visit (INDEPENDENT_AMBULATORY_CARE_PROVIDER_SITE_OTHER): Payer: Managed Care, Other (non HMO)

## 2016-02-21 DIAGNOSIS — Z3042 Encounter for surveillance of injectable contraceptive: Secondary | ICD-10-CM

## 2016-02-21 MED ORDER — MEDROXYPROGESTERONE ACETATE 150 MG/ML IM SUSP
150.0000 mg | Freq: Once | INTRAMUSCULAR | Status: AC
  Administered 2016-02-21: 150 mg via INTRAMUSCULAR

## 2016-02-21 NOTE — Progress Notes (Signed)
PT PRESENTS FOR DEPO-PROVERA WAS GIVEN IN RIGHT VENTROGLUTEAL IM  GLUT W/O CONCERNS PT DUE TO RETURN FOR NEXT DEPO IS BETWEEN April 24TH-MAY 8TH

## 2016-02-24 ENCOUNTER — Other Ambulatory Visit: Payer: Self-pay | Admitting: Physician Assistant

## 2016-03-01 ENCOUNTER — Telehealth: Payer: Self-pay

## 2016-03-01 ENCOUNTER — Encounter: Payer: Self-pay | Admitting: Physician Assistant

## 2016-03-01 MED ORDER — TRAMADOL HCL 50 MG PO TABS: ORAL_TABLET | ORAL | 1 refills | Status: DC

## 2016-03-01 NOTE — Telephone Encounter (Signed)
TRAMADOL WAS CALLED INTO PHARMACY ON 15th FEB 2018 @ 10:32AM BY DD

## 2016-03-05 ENCOUNTER — Other Ambulatory Visit: Payer: Self-pay | Admitting: Physician Assistant

## 2016-03-05 ENCOUNTER — Ambulatory Visit (INDEPENDENT_AMBULATORY_CARE_PROVIDER_SITE_OTHER): Payer: Managed Care, Other (non HMO) | Admitting: Physician Assistant

## 2016-03-05 ENCOUNTER — Encounter: Payer: Self-pay | Admitting: Physician Assistant

## 2016-03-05 VITALS — BP 120/78 | HR 76 | Temp 97.7°F | Resp 16 | Ht 67.0 in | Wt 204.0 lb

## 2016-03-05 DIAGNOSIS — Z79899 Other long term (current) drug therapy: Secondary | ICD-10-CM

## 2016-03-05 DIAGNOSIS — M797 Fibromyalgia: Secondary | ICD-10-CM

## 2016-03-05 DIAGNOSIS — R7303 Prediabetes: Secondary | ICD-10-CM

## 2016-03-05 DIAGNOSIS — R350 Frequency of micturition: Secondary | ICD-10-CM

## 2016-03-05 DIAGNOSIS — E559 Vitamin D deficiency, unspecified: Secondary | ICD-10-CM | POA: Diagnosis not present

## 2016-03-05 DIAGNOSIS — D509 Iron deficiency anemia, unspecified: Secondary | ICD-10-CM

## 2016-03-05 DIAGNOSIS — I10 Essential (primary) hypertension: Secondary | ICD-10-CM

## 2016-03-05 DIAGNOSIS — Z3042 Encounter for surveillance of injectable contraceptive: Secondary | ICD-10-CM

## 2016-03-05 LAB — LIPID PANEL
CHOL/HDL RATIO: 2.1 ratio (ref <5.0)
Cholesterol: 115 mg/dL (ref <200)
HDL: 56 mg/dL (ref >50)
LDL Cholesterol: 49 mg/dL (ref <100)
Triglycerides: 50 mg/dL (ref <150)
VLDL: 10 mg/dL (ref <30)

## 2016-03-05 LAB — CBC WITH DIFFERENTIAL/PLATELET
BASOS PCT: 0 %
Basophils Absolute: 0 cells/uL (ref 0–200)
EOS PCT: 1 %
Eosinophils Absolute: 63 cells/uL (ref 15–500)
HCT: 41.3 % (ref 35.0–45.0)
Hemoglobin: 13.9 g/dL (ref 11.7–15.5)
LYMPHS PCT: 37 %
Lymphs Abs: 2331 cells/uL (ref 850–3900)
MCH: 29.7 pg (ref 27.0–33.0)
MCHC: 33.7 g/dL (ref 32.0–36.0)
MCV: 88.2 fL (ref 80.0–100.0)
MONOS PCT: 10 %
MPV: 9.5 fL (ref 7.5–12.5)
Monocytes Absolute: 630 cells/uL (ref 200–950)
NEUTROS PCT: 52 %
Neutro Abs: 3276 cells/uL (ref 1500–7800)
PLATELETS: 332 10*3/uL (ref 140–400)
RBC: 4.68 MIL/uL (ref 3.80–5.10)
RDW: 14.3 % (ref 11.0–15.0)
WBC: 6.3 10*3/uL (ref 3.8–10.8)

## 2016-03-05 LAB — BASIC METABOLIC PANEL WITH GFR
BUN: 15 mg/dL (ref 7–25)
CALCIUM: 9.4 mg/dL (ref 8.6–10.2)
CO2: 23 mmol/L (ref 20–31)
Chloride: 108 mmol/L (ref 98–110)
Creat: 0.71 mg/dL (ref 0.50–1.10)
GLUCOSE: 92 mg/dL (ref 65–99)
POTASSIUM: 3.6 mmol/L (ref 3.5–5.3)
Sodium: 142 mmol/L (ref 135–146)

## 2016-03-05 LAB — HEPATIC FUNCTION PANEL
ALBUMIN: 4.5 g/dL (ref 3.6–5.1)
ALT: 15 U/L (ref 6–29)
AST: 18 U/L (ref 10–35)
Alkaline Phosphatase: 123 U/L — ABNORMAL HIGH (ref 33–115)
BILIRUBIN INDIRECT: 0.3 mg/dL (ref 0.2–1.2)
BILIRUBIN TOTAL: 0.4 mg/dL (ref 0.2–1.2)
Bilirubin, Direct: 0.1 mg/dL (ref <=0.2)
TOTAL PROTEIN: 7.1 g/dL (ref 6.1–8.1)

## 2016-03-05 LAB — TSH: TSH: 0.92 mIU/L

## 2016-03-05 LAB — IRON AND TIBC
%SAT: 19 % (ref 11–50)
Iron: 74 ug/dL (ref 40–190)
TIBC: 389 ug/dL (ref 250–450)
UIBC: 315 ug/dL (ref 125–400)

## 2016-03-05 LAB — FERRITIN: Ferritin: 11 ng/mL (ref 10–232)

## 2016-03-05 NOTE — Progress Notes (Signed)
Assessment and Plan:  Hypertension -Continue medication, monitor blood pressure at home. Continue DASH diet.  Reminder to go to the ER if any CP, SOB, nausea, dizziness, severe HA, changes vision/speech, left arm numbness and tingling and jaw pain.   Cholesterol -Continue diet and exercise. Check cholesterol.   Prediabetes  -Continue diet and exercise. Check A1C   Vitamin D Def - check level and continue medications.    FM will continue to prescribe tramadol, has been on for 7 years without increase or abuse, long discussion that if ever over use will refer to pain management.  Seeing Julieta Bellini for counseling  Urinary frequency Check urine  Patient is s/p gastric bypass - Check labs to do to screen for vitamin deficiencies associated with gastric bypass including Vitamin D, B12, iron. Recommend strict diet and exercise.    Continue diet and meds as discussed. Further disposition pending results of labs. Over 30 minutes of exam, counseling, chart review, and critical decision making was performed Future Appointments Date Time Provider Department Center  03/19/2016 5:00 PM [facility]  05/08/2016 4:30 PM [facility] NURSE [facility] None  08/30/2016 9:00 AM Quentin Mulling, PA-C [facility] None    HPI 51 y.o. female  presents for 3 month follow up on hypertension, cholesterol, prediabetes, and vitamin D deficiency.   Her blood pressure has been controlled at home, today their BP is BP: 120/78  She does workout. She denies chest pain, shortness of breath, dizziness.  She is not on cholesterol medication and denies myalgias. Her cholesterol is at goal. The cholesterol last visit was:   Lab Results  Component Value Date   CHOL 114 (L) 08/30/2015   HDL 57 08/30/2015   LDLCALC 45 08/30/2015   TRIG 62 08/30/2015   CHOLHDL 2.0 08/30/2015    She has been working on diet and exercise for prediabetes, and denies paresthesia of the feet, polydipsia, polyuria and  visual disturbances. Last A1C in the office was:  Lab Results  Component Value Date   HGBA1C 5.3 08/30/2015   Patient is on Vitamin D supplement.   Lab Results  Component Value Date   VD25OH 51 08/30/2015   She is on tramadol for FM, takes 4 a day occ 3 a day and has been on it for 7 years, has never over used it.  She has tried and failed lyrica and cymbalta, topamax.  Seeing bariatric clinics.  She has been having body aches with changing weather, states it was worse Saturday, epson salt baths.  BMI is Body mass index is 31.95 kg/m., she is working on diet and exercise. Wt Readings from Last 3 Encounters:  03/05/16 204 lb (92.5 kg)  08/30/15 192 lb 6.4 oz (87.3 kg)  02/01/15 210 lb (95.3 kg)     Current Medications:  Current Outpatient Prescriptions on File Prior to Visit  Medication Sig Dispense Refill  . Aspirin-Acetaminophen-Caffeine (EXCEDRIN EXTRA STRENGTH PO) Take by mouth daily as needed.    Marland Kitchen augmented betamethasone dipropionate (DIPROLENE-AF) 0.05 % ointment Apply topically daily. 50 g 3  . lisinopril-hydrochlorothiazide (PRINZIDE,ZESTORETIC) 20-12.5 MG tablet TAKE 1 TABLET BY MOUTH DAILY 90 tablet 0  . LORazepam (ATIVAN) 2 MG tablet TAKE 1 TABLET AT BEDTIME IF NEEDED FOR SLEEP. 30 tablet 2  . medroxyPROGESTERone (DEPO-PROVERA) 150 MG/ML injection Inject 150 mg into the muscle every 3 (three) months.     . metoCLOPramide (REGLAN) 10 MG tablet TAKE ONE TABLET BY MOUTH ONCE DAILY 90 tablet 0  . Multiple  Vitamins-Minerals (MULTIVITAMIN WITH MINERALS) tablet Take 1 tablet by mouth daily. Bariatric multi vitamin.    Marland Kitchen. topiramate (TOPAMAX) 100 MG tablet 2 nightly 180 tablet 1  . traMADol (ULTRAM) 50 MG tablet TAKE (2) TABLETS TWICE DAILY. 120 tablet 1  . Vitamin D, Ergocalciferol, (DRISDOL) 50000 units CAPS capsule TAKE ONE CAPSULE BY MOUTH TWICE A WEEK 30 capsule 1   No current facility-administered medications on file prior to visit.    Medical History:  Past Medical  History:  Diagnosis Date  . Anemia   . Arthritis   . Fibromyalgia   . Hypertension   . IBS (irritable bowel syndrome)   . Nephrolithiasis   . Plantar fasciitis of right foot    wears boot    Allergies: No Known Allergies   Review of Systems:  Review of Systems  Constitutional: Positive for malaise/fatigue. Negative for chills, diaphoresis, fever and weight loss.  Eyes: Negative.   Respiratory: Negative.   Cardiovascular: Negative.   Gastrointestinal: Negative.   Genitourinary: Negative.   Musculoskeletal: Positive for back pain, joint pain, myalgias and neck pain. Negative for falls.  Skin: Negative.   Neurological: Negative.  Negative for weakness.  Psychiatric/Behavioral: Negative.     Family history- Review and unchanged Social history- Review and unchanged Physical Exam: BP 120/78   Pulse 76   Temp 97.7 F (36.5 C)   Resp 16   Ht 5\' 7"  (1.702 m)   Wt 204 lb (92.5 kg)   SpO2 98%   BMI 31.95 kg/m  Wt Readings from Last 3 Encounters:  03/05/16 204 lb (92.5 kg)  08/30/15 192 lb 6.4 oz (87.3 kg)  02/01/15 210 lb (95.3 kg)   General Appearance: Well nourished, in no apparent distress. Eyes: PERRLA, EOMs, conjunctiva no swelling or erythema Sinuses: No Frontal/maxillary tenderness ENT/Mouth: Ext aud canals clear, TMs without erythema, bulging. No erythema, swelling, or exudate on post pharynx.  Tonsils not swollen or erythematous. Hearing normal.  Neck: Supple, thyroid normal.  Respiratory: Respiratory effort normal, BS equal bilaterally without rales, rhonchi, wheezing or stridor.  Cardio: RRR with no MRGs. Brisk peripheral pulses without edema.  Abdomen: Soft, + BS,  Non tender, no guarding, rebound, hernias, masses. Lymphatics: Non tender without lymphadenopathy.  Musculoskeletal: Full ROM, 5/5 strength, Normal gait Skin: Warm, dry without rashes, lesions, ecchymosis.  Neuro: Cranial nerves intact. Normal muscle tone, no cerebellar symptoms. Psych: Awake and  oriented X 3, normal affect, Insight and Judgment appropriate.    Quentin MullingAmanda Britlee Skolnik, PA-C 4:54 PM [facility]

## 2016-03-05 NOTE — Patient Instructions (Signed)
Simple math prevails.    1st - exercise does not produce significant weight loss - at best one converts fat into muscle , "bulks up", loses inches, but usually stays "weight neutral"     2nd - think of your body weightas a check book: If you eat more calories than you burn up - you save money or gain weight .... Or if you spend more money than you put in the check book, ie burn up more calories than you eat, then you lose weight     3rd - if you walk or run 1 mile, you burn up 100 calories - you have to burn up 3,500 calories to lose 1 pound, ie you have to walk/run 35 miles to lose 1 measly pound. So if you want to lose 10 #, then you have to walk/run 350 miles, so.... clearly exercise is not the solution.     4. So if you consume 1,500 calories, then you have to burn up the equivalent of 15 miles to stay weight neutral - It also stands to reason that if you consume 1,500 cal/day and don't lose weight, then you must be burning up about 1,500 cals/day to stay weight neutral.     5. If you really want to lose weight, you must cut your calorie intake 300 calories /day and at that rate you should lose about 1 # every 3 days.   6. Please purchase Dr Francis DowseJoel Fuhrman's book(s) "The End of Dieting" & "Eat to Live" . It has some great concepts and recipes.     Send the labs from the clinic

## 2016-03-06 LAB — MAGNESIUM: MAGNESIUM: 2.1 mg/dL (ref 1.5–2.5)

## 2016-03-06 LAB — VITAMIN D 25 HYDROXY (VIT D DEFICIENCY, FRACTURES): VIT D 25 HYDROXY: 36 ng/mL (ref 30–100)

## 2016-03-06 LAB — FOLATE RBC: RBC Folate: 432 ng/mL (ref >280)

## 2016-03-07 LAB — FOLLICLE STIMULATING HORMONE: FSH: 21.2 m[IU]/mL

## 2016-03-12 ENCOUNTER — Encounter: Payer: Self-pay | Admitting: Physician Assistant

## 2016-03-12 MED ORDER — BETAMETHASONE DIPROPIONATE AUG 0.05 % EX OINT: TOPICAL_OINTMENT | Freq: Every day | CUTANEOUS | 3 refills | Status: DC

## 2016-03-19 ENCOUNTER — Ambulatory Visit
Admission: RE | Admit: 2016-03-19 | Discharge: 2016-03-19 | Disposition: A | Discharge status: Home / Self Care | Payer: Managed Care, Other (non HMO) | Source: Ambulatory Visit | Attending: Physician Assistant | Admitting: Physician Assistant

## 2016-03-19 DIAGNOSIS — Z1231 Encounter for screening mammogram for malignant neoplasm of breast: Secondary | ICD-10-CM

## 2016-03-29 ENCOUNTER — Other Ambulatory Visit: Payer: Self-pay | Admitting: Physician Assistant

## 2016-03-29 NOTE — Telephone Encounter (Signed)
please call Lorazepam 

## 2016-03-30 NOTE — Telephone Encounter (Signed)
Ativan was called into pharmacy @ 8:14am on 16th March 2018 by DD

## 2016-04-04 ENCOUNTER — Other Ambulatory Visit: Payer: Self-pay | Admitting: Physician Assistant

## 2016-05-01 ENCOUNTER — Other Ambulatory Visit: Payer: Self-pay | Admitting: Physician Assistant

## 2016-05-01 NOTE — Telephone Encounter (Signed)
Tramadol was called into pharmacy @ 3:31 pm on 17 th April 2018 By DD.

## 2016-05-08 ENCOUNTER — Ambulatory Visit (INDEPENDENT_AMBULATORY_CARE_PROVIDER_SITE_OTHER): Payer: Managed Care, Other (non HMO)

## 2016-05-08 DIAGNOSIS — Z30019 Encounter for initial prescription of contraceptives, unspecified: Secondary | ICD-10-CM | POA: Diagnosis not present

## 2016-05-08 MED ORDER — MEDROXYPROGESTERONE ACETATE 150 MG/ML IM SUSP
150.0000 mg | Freq: Once | INTRAMUSCULAR | Status: AC
  Administered 2016-05-08: 150 mg via INTRAMUSCULAR

## 2016-05-08 NOTE — Progress Notes (Signed)
PT PRESENTS FOR DEPO-PROVERA WAS GIVEN IN LEFT VENTROGLUTEAL IM  GLUT W/O CONCERNS

## 2016-05-27 ENCOUNTER — Other Ambulatory Visit: Payer: Self-pay | Admitting: Internal Medicine

## 2016-06-28 ENCOUNTER — Other Ambulatory Visit: Payer: Self-pay | Admitting: Physician Assistant

## 2016-07-24 ENCOUNTER — Ambulatory Visit (INDEPENDENT_AMBULATORY_CARE_PROVIDER_SITE_OTHER): Payer: Managed Care, Other (non HMO)

## 2016-07-24 DIAGNOSIS — Z309 Encounter for contraceptive management, unspecified: Secondary | ICD-10-CM

## 2016-07-24 MED ORDER — MEDROXYPROGESTERONE ACETATE 150 MG/ML IM SUSP
150.0000 mg | Freq: Once | INTRAMUSCULAR | Status: AC
  Administered 2016-07-24: 150 mg via INTRAMUSCULAR

## 2016-07-24 NOTE — Progress Notes (Signed)
PT PRESENTS FOR DEPO-PROVERA WAS GIVEN IN RIGHT VENTROGLUTEAL IM GLUT W/O CONCERNS

## 2016-07-27 ENCOUNTER — Other Ambulatory Visit: Payer: Self-pay | Admitting: Physician Assistant

## 2016-07-29 ENCOUNTER — Encounter: Payer: Self-pay | Admitting: Physician Assistant

## 2016-07-30 NOTE — Telephone Encounter (Signed)
TRAMADOL WAS CALLED INTO PHARMACY ON 16TH July 2018 AT 11:12AM BY DD

## 2016-08-25 ENCOUNTER — Other Ambulatory Visit: Payer: Self-pay | Admitting: Internal Medicine

## 2016-08-27 ENCOUNTER — Other Ambulatory Visit: Payer: Self-pay | Admitting: Internal Medicine

## 2016-08-28 NOTE — Telephone Encounter (Signed)
Tramadol was called into pharmacy on 14th Aug 2018 at 8:39am by DD

## 2016-08-28 NOTE — Telephone Encounter (Signed)
Ativan was called into pharmacy on 14th Aug. 2018 at 8:37am by DD

## 2016-08-29 NOTE — Progress Notes (Signed)
Complete Physical  Assessment and Plan: Essential hypertension - continue medications, DASH diet, exercise and monitor at home. Call if greater than 130/80.  - CBC with Differential/Platelet - BASIC METABOLIC PANEL WITH GFR - Hepatic function panel - TSH - Urinalysis, Routine w reflex microscopic (not at [facility]) - Microalbumin / creatinine urine ratio - EKG 12-Lead  Prediabetes Discussed general issues about diabetes pathophysiology and management., Educational material distributed., Suggested low cholesterol diet., Encouraged aerobic exercise., Discussed foot care., Reminded to get yearly retinal exam. - Hemoglobin A1c - Lipid panel - Insulin, fasting  Morbid obesity Obesity with co morbidities- long discussion about weight loss, diet, and exercise  Tobacco abuse Smoking cessation-  instruction/counseling given, counseled patient on the dangers of tobacco use, advised patient to stop smoking, and reviewed strategies to maximize success, patient not ready to quit at this time.   Medication management - Magnesium  Vitamin D deficiency - Vit D  25 hydroxy (rtn osteoporosis monitoring)  Insomnia  good sleep hygiene discussed, increase day time activity  Anxiety state continue medications, stress management techniques discussed, increase water, good sleep hygiene discussed, increase exercise, and increase veggies.   Fibromyalgia Monitor, discuss tramadol use, if ever uses more will send to pain management, understands.   S/P gastric bypass Patient is s/p gastric bypass- Check labs to do to screen for vitamin deficiencies associated with gastric bypass including Vitamin D, B12, iron. Recommend strict diet and exercise.  - Iron and TIBC - Ferritin - Vitamin B12 - Folate  Elevated liver enzymes Will monitor, avoid tylenol  Encounter for general adult medical examination with abnormal findings  Screening for cervical cancer -     Cytology - PAP  Need for  diphtheria-tetanus-pertussis (Tdap) vaccine -     Tdap vaccine greater than or equal to 7yo IM   Discussed med's effects and SE's. Screening labs and tests as requested with regular follow-up as recommended. Over 40 minutes of exam, counseling, chart review, and complex, high level critical decision making was performed this visit.   HPI  51 y.o. female  presents for a complete physical.  Her blood pressure has been controlled at home, today their BP is BP: 122/80 She does workout, doing kick boxing at the gym. She denies chest pain, shortness of breath, dizziness.  She is not on cholesterol medication and denies myalgias. Her cholesterol is at goal. The cholesterol last visit was:   Lab Results  Component Value Date   CHOL 115 03/05/2016   HDL 56 03/05/2016   LDLCALC 49 03/05/2016   TRIG 50 03/05/2016   CHOLHDL 2.1 03/05/2016   She has been working on diet and exercise for prediabetes, and denies paresthesia of the feet, polydipsia, polyuria and visual disturbances. Last A1C in the office was:  Lab Results  Component Value Date   HGBA1C 5.3 08/30/2015   Patient is on Vitamin D supplement.   Lab Results  Component Value Date   VD25OH 36 03/05/2016     BMI is Body mass index is 33.09 kg/m., she is working on diet and exercise s/p gastric bypass Sept 2013. Has been getting phentermine from weight loss clinic, given 08/01, and getting HCG shots. .  Wt Readings from Last 3 Encounters:  08/30/16 216 lb (98 kg)  03/05/16 204 lb (92.5 kg)  08/30/15 192 lb 6.4 oz (87.3 kg)   She has FM and takes tramadol for this, will take 1.5 tablets 3 times a day and occ another at night, understands that at any  time if she increases dose will be sent to pain clinic.  She has had elevated alk phos with normal Ab [place], GGT and bone scan in 2001. Has tried topamax in the past.  Has had negative autoimmune workup in the past.  She had a normal DEXA 09/2013, has been on depo for 12 years, will  stop. She does smoke, has cut back, goal is to quit when she is 51.   Is going through menopause and states she is having a hard time, very moody, decreased sexual drive.  She takes 2 mg of ativan for sleep.    Current Medications:  Current Outpatient Prescriptions on File Prior to Visit  Medication Sig Dispense Refill  . Aspirin-Acetaminophen-Caffeine (EXCEDRIN EXTRA STRENGTH PO) Take by mouth daily as needed.    Marland Kitchen augmented betamethasone dipropionate (DIPROLENE-AF) 0.05 % ointment Apply topically daily. 50 g 3  . Glucosamine-Chondroitin (COSAMIN DS PO) Take 1,500 mg by mouth daily.    Marland Kitchen lisinopril-hydrochlorothiazide (PRINZIDE,ZESTORETIC) 20-12.5 MG tablet TAKE 1 TABLET BY MOUTH EVERY DAY 90 tablet 0  . LORazepam (ATIVAN) 2 MG tablet TAKE 1/2 TO 1 TABLET BY MOUTH AT BEDTIME ONLY AS NEEDED FOR SLEEP *LIMIT 5TABS/WEEK 60 tablet 1  . medroxyPROGESTERone (DEPO-PROVERA) 150 MG/ML injection Inject 150 mg into the muscle every 3 (three) months.     . metoCLOPramide (REGLAN) 10 MG tablet TAKE ONE TABLET BY MOUTH ONCE DAILY 90 tablet 0  . Multiple Vitamins-Minerals (MULTIVITAMIN WITH MINERALS) tablet Take 1 tablet by mouth daily. Bariatric multi vitamin.    . traMADol (ULTRAM) 50 MG tablet TAKE TWO TABLETS BY MOUTH TWICE A DAY 120 tablet 0  . Vitamin D, Ergocalciferol, (DRISDOL) 50000 units CAPS capsule TAKE ONE CAPSULE BY MOUTH TWICE A WEEK 30 capsule 0   No current facility-administered medications on file prior to visit.    Health Maintenance:   Immunization History  Administered Date(s) Administered  . Influenza Split 10/02/2011, 10/16/2013, 11/15/2014  . Pneumococcal Polysaccharide-23 10/02/2011   Tetanus: 2008 DUE Pneumovax: 2013 Prevnar 13: N/A Flu vaccine: 2016 Zostavax: N/A  LMP: post menopausal will stop depo Pap: 02/2013 DUE MGM:  [id number] DEXA: 09/2013  Colonoscopy: 06/2011 normal EGD: N/A Myoview stress test 10/2010  Last Dental Exam: Dr. Quincy Simmonds Last Eye Exam: Dr  Randall Hiss DERM Dr. Raquel Sarna, OB/GYN Patient Care Team: Unk Pinto, MD as PCP - General (Internal Medicine) Himmelrich, Bryson Ha, RD (Inactive) as Dietitian (Bariatrics) Ladene Artist, MD as Consulting Physician (Gastroenterology)  Medical History:  Past Medical History:  Diagnosis Date  . Anemia   . Arthritis   . Fibromyalgia   . Hypertension   . IBS (irritable bowel syndrome)   . Nephrolithiasis   . Plantar fasciitis of right foot    wears boot    Allergies No Known Allergies  SURGICAL HISTORY She  has a past surgical history that includes Left shoulder arthroscopic surgery (yrs ago); tummy tuck (2005); Breath tek h pylori (07/11/2011); Gastric Roux-En-Y (10/01/2011); and Cholecystectomy (10/01/2011). FAMILY HISTORY Her family history includes Breast cancer (age of onset: 44) in her mother; Breast cancer (age of onset: 21) in her maternal aunt; Cancer in her maternal aunt and maternal grandmother; Cancer (age of onset: 104) in her mother; Heart attack in her maternal grandfather and paternal grandfather; Heart disease in her father and maternal grandfather; Hypertension in her father; Lung cancer in her maternal grandmother; Multiple sclerosis in her maternal uncle; Obesity in her brother; Stroke in her father. SOCIAL HISTORY She  reports that  she has been smoking Cigarettes.  She has a 1.75 pack-year smoking history. She has never used smokeless tobacco. She reports that she drinks about 0.6 oz of alcohol per week . She reports that she does not use drugs.   Review of Systems: Review of Systems  Constitutional: Positive for malaise/fatigue. Negative for chills, diaphoresis, fever and weight loss.  Eyes: Negative.   Respiratory: Negative.   Cardiovascular: Negative.   Gastrointestinal: Negative.   Genitourinary: Negative.   Musculoskeletal: Positive for back pain, joint pain, myalgias and neck pain. Negative for falls.  Skin: Negative.   Neurological: Negative.  Negative for  weakness.  Psychiatric/Behavioral: Negative.     Physical Exam: Estimated body mass index is 33.09 kg/m as calculated from the following:   Height as of this encounter: 5' 7.75" (1.721 m).   Weight as of this encounter: 216 lb (98 kg). BP 122/80   Temp (!) 97.5 F (36.4 C)   Resp 14   Ht 5' 7.75" (1.721 m)   Wt 216 lb (98 kg)   BMI 33.09 kg/m  General Appearance: Well nourished, in no apparent distress.  Eyes: PERRLA, EOMs, conjunctiva no swelling or erythema, normal fundi and vessels.  Sinuses: No Frontal/maxillary tenderness  ENT/Mouth: Ext aud canals clear, normal light reflex with TMs without erythema, bulging. Good dentition. No erythema, swelling, or exudate on post pharynx. Tonsils not swollen or erythematous. Hearing normal.  Neck: Supple, thyroid normal. No bruits  Respiratory: Respiratory effort normal, BS equal bilaterally without rales, rhonchi, wheezing or stridor.  Cardio: RRR without murmurs, rubs or gallops. Brisk peripheral pulses without edema.  Chest: symmetric, with normal excursions and percussion.  Breasts: defer  Abdomen: Soft, nontender, no guarding, rebound, hernias, masses, or organomegaly.  Lymphatics: Non tender without lymphadenopathy.  Genitourinary: defer Musculoskeletal: Full ROM all peripheral extremities,5/5 strength, and normal gait.  Skin: Warm, dry without rashes, lesions, ecchymosis. Neuro: Cranial nerves intact, reflexes equal bilaterally. Normal muscle tone, no cerebellar symptoms. Sensation intact.  Psych: Awake and oriented X 3, normal affect, Insight and Judgment appropriate.   EKG: declines AORTA SCAN: defer   Wendy Valencia 9:21 AM [facility]

## 2016-08-30 ENCOUNTER — Other Ambulatory Visit (HOSPITAL_COMMUNITY)
Admission: RE | Admit: 2016-08-30 | Discharge: 2016-08-30 | Disposition: A | Discharge status: Home / Self Care | Payer: Managed Care, Other (non HMO) | Source: Ambulatory Visit | Attending: Physician Assistant | Admitting: Physician Assistant

## 2016-08-30 ENCOUNTER — Encounter: Payer: Self-pay | Admitting: Physician Assistant

## 2016-08-30 ENCOUNTER — Ambulatory Visit (INDEPENDENT_AMBULATORY_CARE_PROVIDER_SITE_OTHER): Payer: Managed Care, Other (non HMO) | Admitting: Physician Assistant

## 2016-08-30 VITALS — BP 122/80 | Temp 97.5°F | Resp 14 | Ht 67.75 in | Wt 216.0 lb

## 2016-08-30 DIAGNOSIS — I10 Essential (primary) hypertension: Secondary | ICD-10-CM

## 2016-08-30 DIAGNOSIS — Z13220 Encounter for screening for lipoid disorders: Secondary | ICD-10-CM | POA: Insufficient documentation

## 2016-08-30 DIAGNOSIS — Z9884 Bariatric surgery status: Secondary | ICD-10-CM

## 2016-08-30 DIAGNOSIS — R7303 Prediabetes: Secondary | ICD-10-CM | POA: Diagnosis not present

## 2016-08-30 DIAGNOSIS — Z0000 Encounter for general adult medical examination without abnormal findings: Secondary | ICD-10-CM | POA: Diagnosis not present

## 2016-08-30 DIAGNOSIS — Z79899 Other long term (current) drug therapy: Secondary | ICD-10-CM

## 2016-08-30 DIAGNOSIS — Z23 Encounter for immunization: Secondary | ICD-10-CM | POA: Diagnosis not present

## 2016-08-30 DIAGNOSIS — Z0001 Encounter for general adult medical examination with abnormal findings: Secondary | ICD-10-CM

## 2016-08-30 DIAGNOSIS — Z136 Encounter for screening for cardiovascular disorders: Secondary | ICD-10-CM

## 2016-08-30 DIAGNOSIS — Z124 Encounter for screening for malignant neoplasm of cervix: Secondary | ICD-10-CM | POA: Insufficient documentation

## 2016-08-30 DIAGNOSIS — E538 Deficiency of other specified B group vitamins: Secondary | ICD-10-CM

## 2016-08-30 DIAGNOSIS — E559 Vitamin D deficiency, unspecified: Secondary | ICD-10-CM

## 2016-08-30 DIAGNOSIS — Z720 Tobacco use: Secondary | ICD-10-CM

## 2016-08-30 DIAGNOSIS — M797 Fibromyalgia: Secondary | ICD-10-CM

## 2016-08-30 DIAGNOSIS — D509 Iron deficiency anemia, unspecified: Secondary | ICD-10-CM

## 2016-08-30 NOTE — Patient Instructions (Addendum)
Stop depo  Call to see if insurance is covered for cologuard If not can call Dr. Fuller Plan and see if need OV/colonosocpy Phone: [phone];  Cologuard is an easy to use noninvasive colon cancer screening test based on the latest advances in stool DNA science.   Colon cancer is 3rd most diagnosed cancer and 2nd leading cause of death in both men and women 50 years of age and older despite being one of the most preventable and treatable cancers if found early.  4 of out 5 people diagnosed with colon cancer have NO prior family history.  When caught EARLY 90% of colon cancer is curable.   You have agreed to do a Cologuard screening and have declined a colonoscopy in spite of being explained the risks and benefits of the colonoscopy in detail, including cancer and death. Please understand that this is test not as sensitive or specific as a colonoscopy and you are still recommended to get a colonoscopy.   If you are NOT medicare please call your insurance company and give them these items to see if they will cover it: 1) CPT code, [phone] 2) Provider is Probation officer 3) [employer] NPI [id number] 4) [place] Tax ID [phone]  Out-of-pocket cost for Cologuard can range from $0 - $649 so please call  You will receive a short call from [place] support center at [employer], when you receive a call they will say they are from [place],  to confirm your mailing address and give you more information.  When they calll you, it will appear on the caller ID as "[employer]" or in some cases only this number will appear, [phone].   [employer] will ship your collection kit directly to you. You will collect a single stool sample in the privacy of your own home, no special preparation required. You will return the kit via [facility] pre-paid shipping or pick-up, in the same box it arrived in. Then I will contact you to discuss your results  after I receive them from the laboratory.   If you have any questions or concerns, Cologuard Customer Support Specialist are available 24 hours a day, 7 days a week at [phone] or go to [email or web address].     10 Tips on Belching, Bloating, and Flatulence 1. Belching is caused by swallowed air from:  Eating or drinking too fast  Poorly fitting dentures; not chewing food completely  Carbonated beverages  Chewing gum or sucking on hard candies  Excessive swallowing due to nervous tension or postnasal drip  Forced belching to relieve abdominal discomfort 2. To prevent excessive belching, avoid:  Carbonated beverages  Chewing gum  Hard candies  Simethicone/GasX may be helpful  3. Abdominal bloating and discomfort may be due to intestinal sensitivity or symptoms of irritable bowel syndrome. To relieve symptoms, avoid:  Broccoli  Baked beans  Cabbage  Carbonated drinks  Cauliflower  Chewing gum  Hard candy 4. Abdominal distention resulting from weak abdominal muscles:  Is better in the morning  Gets worse as the day progresses  Is relieved by lying down 5. To prevent Abdominal distention:  Tighten abdominal muscles by pulling in your stomach several times during the day  Do sit-up exercises if possible  Wear an abdominal support garment if exercise is too difficult 6. Flatulence is gas created through bacterial action in the bowel and passed rectally. Keep in mind that:  10-18 passages per day are normal  Primary gases are harmless and  odorless  Noticeable smells are trace gases related to food intake 7. Foods that are likely to form gas include:  Milk, dairy products, and medications that contain lactose--If your body doesn't produce the enzyme (lactase) to break it down.  Certain vegetables--baked beans, cauliflower, broccoli, cabbage  Certain starches--wheat, oats, corn, potatoes. Rice is a good substitute. 8. Identify offending foods. Reduce or eliminate these  gas-forming foods from your diet.

## 2016-08-31 LAB — URINE CULTURE
MICRO NUMBER: 80889105
RESULT: NO GROWTH
SPECIMEN QUALITY:: ADEQUATE

## 2016-08-31 LAB — URINALYSIS, ROUTINE W REFLEX MICROSCOPIC
Bilirubin Urine: NEGATIVE
Glucose, UA: NEGATIVE
HGB URINE DIPSTICK: NEGATIVE
KETONES UR: NEGATIVE
Leukocytes, UA: NEGATIVE
NITRITE: NEGATIVE
PH: 5.5 (ref 5.0–8.0)
Protein, ur: NEGATIVE
Specific Gravity, Urine: 1.021 (ref 1.001–1.03)

## 2016-08-31 LAB — MICROALBUMIN / CREATININE URINE RATIO
CREATININE, URINE: 143 mg/dL (ref 20–320)
Microalb Creat Ratio: 4 mcg/mg creat (ref <30)
Microalb, Ur: 0.6 mg/dL

## 2016-09-03 LAB — BASIC METABOLIC PANEL WITH GFR
BUN: 11 mg/dL (ref 7–25)
CO2: 21 mmol/L (ref 20–32)
CREATININE: 0.73 mg/dL (ref 0.50–1.05)
Calcium: 9.2 mg/dL (ref 8.6–10.4)
Chloride: 107 mmol/L (ref 98–110)
GFR, Est African American: 111 mL/min/{1.73_m2} (ref >=60)
GFR, Est Non African American: 96 mL/min/{1.73_m2} (ref >=60)
GLUCOSE: 98 mg/dL (ref 65–99)
Potassium: 3.9 mmol/L (ref 3.5–5.3)
Sodium: 139 mmol/L (ref 135–146)

## 2016-09-03 LAB — TSH: TSH: 1.07 mIU/L

## 2016-09-03 LAB — HEPATIC FUNCTION PANEL
AG RATIO: 1.7 (calc) (ref 1.0–2.5)
ALT: 15 U/L (ref 6–29)
AST: 15 U/L (ref 10–35)
Albumin: 4.2 g/dL (ref 3.6–5.1)
Alkaline phosphatase (APISO): 144 U/L — ABNORMAL HIGH (ref 33–130)
BILIRUBIN INDIRECT: 0.3 mg/dL (ref 0.2–1.2)
Bilirubin, Direct: 0.1 mg/dL (ref 0.0–0.2)
GLOBULIN: 2.5 g/dL (ref 1.9–3.7)
TOTAL PROTEIN: 6.7 g/dL (ref 6.1–8.1)
Total Bilirubin: 0.4 mg/dL (ref 0.2–1.2)

## 2016-09-03 LAB — CYTOLOGY - PAP
Diagnosis: NEGATIVE
HPV: NOT DETECTED

## 2016-09-03 LAB — CBC WITH DIFFERENTIAL/PLATELET
BASOS ABS: 28 {cells}/uL (ref 0–200)
Basophils Relative: 0.5 %
EOS PCT: 0.9 %
Eosinophils Absolute: 50 cells/uL (ref 15–500)
HEMATOCRIT: 39.1 % (ref 35.0–45.0)
HEMOGLOBIN: 13.2 g/dL (ref 11.7–15.5)
LYMPHS ABS: 1705 {cells}/uL (ref 850–3900)
MCH: 28.6 pg (ref 27.0–33.0)
MCHC: 33.8 g/dL (ref 32.0–36.0)
MCV: 84.6 fL (ref 80.0–100.0)
MPV: 10.1 fL (ref 7.5–12.5)
Monocytes Relative: 9.3 %
NEUTROS ABS: 3207 {cells}/uL (ref 1500–7800)
Neutrophils Relative %: 58.3 %
Platelets: 337 10*3/uL (ref 140–400)
RBC: 4.62 10*6/uL (ref 3.80–5.10)
RDW: 13.2 % (ref 11.0–15.0)
Total Lymphocyte: 31 %
WBC: 5.5 10*3/uL (ref 3.8–10.8)
WBCMIX: 512 {cells}/uL (ref 200–950)

## 2016-09-03 LAB — IRON, TOTAL/TOTAL IRON BINDING CAP
%SAT: 16 % (ref 11–50)
Iron: 68 ug/dL (ref 45–160)
TIBC: 422 ug/dL (ref 250–450)

## 2016-09-03 LAB — VITAMIN D 25 HYDROXY (VIT D DEFICIENCY, FRACTURES): Vit D, 25-Hydroxy: 25 ng/mL — ABNORMAL LOW (ref 30–100)

## 2016-09-03 LAB — LIPID PANEL
CHOL/HDL RATIO: 2 (calc) (ref <5.0)
Cholesterol: 119 mg/dL (ref <200)
HDL: 60 mg/dL (ref >50)
LDL Cholesterol (Calc): 45 mg/dL (calc)
NON-HDL CHOLESTEROL (CALC): 59 mg/dL (ref <130)
TRIGLYCERIDES: 61 mg/dL (ref <150)

## 2016-09-03 LAB — FOLATE RBC: RBC FOLATE: 822 ng/mL (ref >280)

## 2016-09-03 LAB — VITAMIN B12: VITAMIN B 12: 505 pg/mL (ref 200–1100)

## 2016-09-03 LAB — HEMOGLOBIN A1C
EAG (MMOL/L): 6.6 (calc)
Hgb A1c MFr Bld: 5.8 % of total Hgb — ABNORMAL HIGH (ref <5.7)
Mean Plasma Glucose: 120 (calc)

## 2016-09-03 LAB — MAGNESIUM: MAGNESIUM: 2 mg/dL (ref 1.5–2.5)

## 2016-09-03 LAB — ZINC: ZINC: 74 ug/dL (ref 60–130)

## 2016-09-03 LAB — FERRITIN: FERRITIN: 8 ng/mL — AB (ref 10–232)

## 2016-09-26 ENCOUNTER — Other Ambulatory Visit: Payer: Self-pay | Admitting: Physician Assistant

## 2016-09-26 NOTE — Telephone Encounter (Signed)
Tramadol called into pharmacy on sept 12th 2018 by DD

## 2016-10-09 ENCOUNTER — Ambulatory Visit: Payer: Self-pay

## 2016-10-23 ENCOUNTER — Ambulatory Visit (INDEPENDENT_AMBULATORY_CARE_PROVIDER_SITE_OTHER): Payer: Managed Care, Other (non HMO) | Admitting: Physician Assistant

## 2016-10-23 ENCOUNTER — Encounter: Payer: Self-pay | Admitting: Physician Assistant

## 2016-10-23 DIAGNOSIS — D509 Iron deficiency anemia, unspecified: Secondary | ICD-10-CM | POA: Diagnosis not present

## 2016-10-23 DIAGNOSIS — E538 Deficiency of other specified B group vitamins: Secondary | ICD-10-CM

## 2016-10-23 DIAGNOSIS — M797 Fibromyalgia: Secondary | ICD-10-CM | POA: Diagnosis not present

## 2016-10-23 DIAGNOSIS — Z23 Encounter for immunization: Secondary | ICD-10-CM

## 2016-10-23 MED ORDER — DULOXETINE HCL 30 MG PO CPEP: 30.0000 mg | ORAL_CAPSULE | Freq: Every day | ORAL | 2 refills | Status: DC

## 2016-10-23 MED ORDER — GABAPENTIN 300 MG PO CAPS: 300.0000 mg | ORAL_CAPSULE | Freq: Three times a day (TID) | ORAL | 2 refills | Status: DC

## 2016-10-23 NOTE — Patient Instructions (Addendum)
Start the cymbalta  x 1 month, can increase to 2 a day or 60 mg after 2-4 weeks.  Can try gabapentin  do 1 at night,  Can do up to 3 x a day.   Continue tramadol, can not take more or can refer to pain management  Counseling services  Here are some numbers below you can try but I suggest calling your insurance and finding out who is in your network and THEN calling those people or looking them up on google.   I'm a big fan of Cognitive Behavioral Therapy, look this up on You tube or check with the therapist you see if they are certified.  This form of therapy helps to teach you skills to better handle with current situation that are causing anxiety or depression.   [facility] Adolphus [place], NP [phone]  Suite [phone] Fax [phone]  [facility] Hours: Monday-Thursday 830-8pm  Friday 830AM-7PM Address: [address] [phone]  [facility].  Address: [address] [place], [place] [address] Phone-[phone]  [facility] [phone] office [email or web address] [address], [address] [place], [place], [place] [address]  Dr. Vilinda Flake, Ph.D. [address] [place] [address] Phone: [phone]  Catharine Dowda, [place] [phone] B[address] [place] [address]   Franchot Erichsen, MA, clinical psychologist  Cognitive-Behavior Therapy; Mood Disorders; Anxiety Disorders; adult and child ADHD; Family Therapy; Stress Management; personal growth, and Marital Therapy.    Carlus Pavlov Ph.D., clinical psychologist Cognitive-Behavior Therapy; Mood Disorders; Anxiety Disorders; Stress     Management  [facility] [address] [place], [place] [address] [phone]   [facility] Sheran Luz, psychotherapist [address] [place], [place] [address] [phone]  Miguel Aschoff Ph.D.,  clinical psychologist [phone] office [address] [place], [place] [address] Cognitive Behavior Therapy, Depression, Bipolar, Anxiety, Grief and Loss

## 2016-10-23 NOTE — Progress Notes (Signed)
Subjective:    Patient ID: Wendy Valencia, female    DOB: 07/01/1965, 51 y.o.   MRN: [id number]  HPI 51 y.o. AAF presents with multitude of issues. She has history of FM, insomnia, anxiety presents with insomnia and pain. She is on tramadol and states the manufacture changed and it is only lasting 2 hours rather than 6. She states she has been snappy and sad mood. She took off work, she states she will start crying at work and can help but to start crying. Feel emotions are very labile, she is no longer seeing anyone for counseling.   Lost her dog 3 weeks ago, 37 year old beagle, dad is estranged, son is being a trouble, husband lost job but has one now. No SI/HI.  She has varicose/spider veins, states they have gotten worse.  Blood pressure 136/80, pulse 61, temperature (!) 97.5 F (36.4 C), resp. rate 16, height 5' 7.75" (1.721 m), weight 213 lb 3.2 oz (96.7 kg), SpO2 97 %.  BMI is Body mass index is 32.66 kg/m., she is working on diet and exercise. Wt Readings from Last 3 Encounters:  10/23/16 213 lb 3.2 oz (96.7 kg)  08/30/16 216 lb (98 kg)  03/05/16 204 lb (92.5 kg)    Medications Current Outpatient Prescriptions on File Prior to Visit  Medication Sig  . Aspirin-Acetaminophen-Caffeine (EXCEDRIN EXTRA STRENGTH PO) Take by mouth daily as needed.  Marland Kitchen augmented betamethasone dipropionate (DIPROLENE-AF) 0.05 % ointment Apply topically daily.  . Glucosamine-Chondroitin (COSAMIN DS PO) Take 1,500 mg by mouth daily.  Marland Kitchen lisinopril-hydrochlorothiazide (PRINZIDE,ZESTORETIC) 20-12.5 MG tablet TAKE 1 TABLET BY MOUTH EVERY DAY  . LORazepam (ATIVAN) 2 MG tablet TAKE 1/2 TO 1 TABLET BY MOUTH AT BEDTIME ONLY AS NEEDED FOR SLEEP *LIMIT 5TABS/WEEK  . metoCLOPramide (REGLAN) 10 MG tablet TAKE ONE TABLET BY MOUTH ONCE DAILY  . Multiple Vitamins-Minerals (MULTIVITAMIN WITH MINERALS) tablet Take 1 tablet by mouth daily. Bariatric multi vitamin.  . traMADol (ULTRAM) 50 MG tablet TAKE TWO TABLETS BY  MOUTH TWICE A DAY  . Vitamin D, Ergocalciferol, (DRISDOL) 50000 units CAPS capsule TAKE ONE CAPSULE BY MOUTH TWICE A WEEK   No current facility-administered medications on file prior to visit.     Problem list She has Anxiety state; Essential hypertension; Tobacco abuse; Morbid obesity (HCC); Prediabetes; Insomnia; Fibromyalgia; Medication management; Vitamin D deficiency; and S/P gastric bypass on her problem list.   Review of Systems  Constitutional: Negative.   HENT: Negative.   Respiratory: Negative.   Cardiovascular: Negative.   Gastrointestinal: Negative.   Genitourinary: Negative.   Musculoskeletal: Positive for myalgias.  Skin: Negative.   Neurological: Negative.   Hematological: Negative.   Psychiatric/Behavioral: Positive for decreased concentration and sleep disturbance. The patient is nervous/anxious.        Objective:   Physical Exam  Constitutional: She is oriented to person, place, and time. She appears well-developed and well-nourished.  HENT:  Head: Normocephalic and atraumatic.  Right Ear: External ear normal.  Left Ear: External ear normal.  Mouth/Throat: Oropharynx is clear and moist.  Eyes: Pupils are equal, round, and reactive to light. Conjunctivae and EOM are normal.  Neck: Normal range of motion. Neck supple. No thyromegaly present.  Cardiovascular: Normal rate, regular rhythm and normal heart sounds.  Exam reveals no gallop and no friction rub.   No murmur heard. Pulmonary/Chest: Effort normal and breath sounds normal. No respiratory distress. She has no wheezes.  Abdominal: Soft. Bowel sounds are normal. She exhibits no distension  and no mass. There is no tenderness. There is no rebound and no guarding.  8 tender spots  Musculoskeletal: Normal range of motion.  Lymphadenopathy:    She has no cervical adenopathy.  Neurological: She is alert and oriented to person, place, and time. She displays normal reflexes. No cranial nerve deficit. Coordination  normal.  Skin: Skin is warm and dry.  Psychiatric: She has a normal mood and affect. Her speech is normal. Cognition and memory are normal. She expresses no homicidal and no suicidal ideation. She expresses no suicidal plans and no homicidal plans.  crying       Assessment & Plan:   Morbid obesity (HCC) Continue weight loss  Fibromyalgia -     DULoxetine (CYMBALTA) 30 MG capsule; Take 1 capsule (30 mg total) by mouth daily. -     gabapentin (NEURONTIN) 300 MG capsule; Take 1 capsule (300 mg total) by mouth 3 (three) times daily. - follow up with counseling  B12 deficiency - B12 is low end of normal, add sublingual B12.   Iron deficiency anemia, unspecified iron deficiency anemia type Continue supplements

## 2016-10-23 NOTE — Addendum Note (Signed)
Addended by: Rodney Booze D on: 10/23/2016 04:59 PM   Modules accepted: Orders

## 2016-10-26 ENCOUNTER — Other Ambulatory Visit: Payer: Self-pay | Admitting: Physician Assistant

## 2016-10-26 ENCOUNTER — Encounter: Payer: Self-pay | Admitting: Physician Assistant

## 2016-10-26 NOTE — Telephone Encounter (Signed)
Please call Lorazepam 

## 2016-10-26 NOTE — Telephone Encounter (Signed)
Ativan has been called into pharmacy on 12th Oct 2018 by DD

## 2016-10-27 ENCOUNTER — Encounter: Payer: Self-pay | Admitting: Physician Assistant

## 2016-10-29 MED ORDER — ULTRAM 50 MG PO TABS: 100.0000 mg | ORAL_TABLET | Freq: Two times a day (BID) | ORAL | 0 refills | Status: DC

## 2016-11-20 ENCOUNTER — Ambulatory Visit (INDEPENDENT_AMBULATORY_CARE_PROVIDER_SITE_OTHER): Payer: Managed Care, Other (non HMO) | Admitting: Physician Assistant

## 2016-11-20 ENCOUNTER — Encounter: Payer: Self-pay | Admitting: Physician Assistant

## 2016-11-20 DIAGNOSIS — M797 Fibromyalgia: Secondary | ICD-10-CM | POA: Diagnosis not present

## 2016-11-20 MED ORDER — ULTRAM 50 MG PO TABS: 100.0000 mg | ORAL_TABLET | Freq: Two times a day (BID) | ORAL | 0 refills | Status: DC

## 2016-11-20 MED ORDER — DULOXETINE HCL 60 MG PO CPEP: 60.0000 mg | ORAL_CAPSULE | Freq: Every day | ORAL | 1 refills | Status: DC

## 2016-11-20 MED ORDER — LORAZEPAM 2 MG PO TABS: ORAL_TABLET | ORAL | 0 refills | Status: DC

## 2016-11-20 NOTE — Patient Instructions (Signed)
  Vitamin D goal is between 60-80  Please make sure that you are taking your Vitamin D as directed.   It is very important as a natural anti-inflammatory   helping hair, skin, and nails, as well as reducing stroke and heart attack risk.   It helps your bones and helps with mood.  We want you on at least 5000 IU daily  It also decreases numerous cancer risks so please take it as directed.   Low Vit D is associated with a 200-300% higher risk for CANCER   and 200-300% higher risk for HEART   ATTACK  &  STROKE.    ......................................  It is also associated with higher death rate at younger ages,   autoimmune diseases like Rheumatoid arthritis, Lupus, Multiple Sclerosis.     Also many other serious conditions, like depression, Alzheimer's  Dementia, infertility, muscle aches, fatigue, fibromyalgia - just to name a few.  +++++++++++++++++++  Can get liquid vitamin D from amazon  OR here in [place] at  [facility] [address], [place], [place] [address] Or you can try [employer]    

## 2016-11-20 NOTE — Progress Notes (Signed)
Subjective:    Patient ID: Wendy Valencia, female    DOB: 20-Sep-1965, 51 y.o.   MRN: [id number]  HPI 51 y.o. AAF presents for follow up FM and depression.  She is on gabapentin at night and the Cymbalta 60 mg for mood.  She is off the depo and this is helping.   BMI is Body mass index is 31.98 kg/m., she is working on diet and exercise. Wt Readings from Last 3 Encounters:  11/20/16 208 lb 12.8 oz (94.7 kg)  10/23/16 213 lb 3.2 oz (96.7 kg)  08/30/16 216 lb (98 kg)    Blood pressure 128/86, pulse 88, temperature (!) 97 F (36.1 C), resp. rate 16, height 5' 7.75" (1.721 m), weight 208 lb 12.8 oz (94.7 kg), SpO2 99 %.  Medications Current Outpatient Medications on File Prior to Visit  Medication Sig  . Aspirin-Acetaminophen-Caffeine (EXCEDRIN EXTRA STRENGTH PO) Take by mouth daily as needed.  Marland Kitchen. augmented betamethasone dipropionate (DIPROLENE-AF) 0.05 % ointment Apply topically daily.  . DULoxetine (CYMBALTA) 30 MG capsule Take 1 capsule (30 mg total) by mouth daily.  Marland Kitchen. gabapentin (NEURONTIN) 300 MG capsule Take 1 capsule (300 mg total) by mouth 3 (three) times daily.  . Glucosamine-Chondroitin (COSAMIN DS PO) Take 1,500 mg by mouth daily.  Marland Kitchen. lisinopril-hydrochlorothiazide (PRINZIDE,ZESTORETIC) 20-12.5 MG tablet TAKE 1 TABLET BY MOUTH EVERY DAY  . LORazepam (ATIVAN) 2 MG tablet TAKE 1/2-1 TAB AT BEDTIME AS NEEDED FOR SLEEP. DO NOT EXCEED 5 TABLETS PER WEEK  . metoCLOPramide (REGLAN) 10 MG tablet TAKE ONE TABLET BY MOUTH ONCE DAILY  . Multiple Vitamins-Minerals (MULTIVITAMIN WITH MINERALS) tablet Take 1 tablet by mouth daily. Bariatric multi vitamin.  Marland Kitchen. ULTRAM 50 MG tablet Take 2 tablets (100 mg total) by mouth 2 (two) times daily.  . Vitamin D, Ergocalciferol, (DRISDOL) 50000 units CAPS capsule TAKE ONE CAPSULE BY MOUTH TWICE A WEEK   No current facility-administered medications on file prior to visit.     Problem list She has Anxiety state; Essential hypertension; Tobacco  abuse; Morbid obesity (HCC); Prediabetes; Insomnia; Fibromyalgia; Medication management; Vitamin D deficiency; and S/P gastric bypass on their problem list.   Review of Systems  Constitutional: Negative.   HENT: Negative.   Respiratory: Negative.   Cardiovascular: Negative.   Gastrointestinal: Negative.   Genitourinary: Negative.   Musculoskeletal: Negative.   Skin: Negative.   Neurological: Negative.   Hematological: Negative.   Psychiatric/Behavioral: Negative.        Objective:   Physical Exam  Constitutional: She is oriented to person, place, and time. She appears well-developed and well-nourished.  HENT:  Head: Normocephalic and atraumatic.  Right Ear: External ear normal.  Left Ear: External ear normal.  Mouth/Throat: Oropharynx is clear and moist.  Eyes: Conjunctivae and EOM are normal. Pupils are equal, round, and reactive to light.  Neck: Normal range of motion. Neck supple. No thyromegaly present.  Cardiovascular: Normal rate, regular rhythm and normal heart sounds. Exam reveals no gallop and no friction rub.  No murmur heard. Pulmonary/Chest: Effort normal and breath sounds normal. No respiratory distress. She has no wheezes.  Abdominal: Soft. Bowel sounds are normal. She exhibits no distension and no mass. There is no tenderness. There is no rebound and no guarding.  Musculoskeletal: Normal range of motion.  Lymphadenopathy:    She has no cervical adenopathy.  Neurological: She is alert and oriented to person, place, and time. She displays normal reflexes. No cranial nerve deficit. Coordination normal.  Skin: Skin is warm  and dry.  Psychiatric: She has a normal mood and affect.       Assessment & Plan:   Fibromyalgia Continue the same -     DULoxetine (CYMBALTA) 60 MG capsule; Take 1 capsule (60 mg total) daily by mouth. -     ULTRAM 50 MG tablet; Take 2 tablets (100 mg total) 2 (two) times daily by mouth. -     LORazepam (ATIVAN) 2 MG tablet; TAKE 1/2-1 TAB  AT BEDTIME AS NEEDED FOR SLEEP. DO NOT EXCEED 5 TABLETS PER WEEK     Future Appointments  Date Time Provider Department Center  03/04/2017  4:30 PM Quentin MullingCollier, Brian Zeitlin, PA-C [facility] None  09/02/2017  9:00 AM Quentin Mullingollier, Emylee Decelle, PA-C [facility] None

## 2016-11-26 ENCOUNTER — Other Ambulatory Visit: Payer: Self-pay | Admitting: Internal Medicine

## 2016-11-30 ENCOUNTER — Other Ambulatory Visit: Payer: Self-pay | Admitting: Internal Medicine

## 2016-12-31 ENCOUNTER — Encounter: Payer: Self-pay | Admitting: Physician Assistant

## 2016-12-31 ENCOUNTER — Other Ambulatory Visit: Payer: Self-pay | Admitting: Physician Assistant

## 2016-12-31 DIAGNOSIS — M797 Fibromyalgia: Secondary | ICD-10-CM

## 2016-12-31 MED ORDER — TRAMADOL HCL 50 MG PO TABS: 100.0000 mg | ORAL_TABLET | Freq: Two times a day (BID) | ORAL | 0 refills | Status: DC

## 2017-01-01 ENCOUNTER — Other Ambulatory Visit: Payer: Self-pay | Admitting: Internal Medicine

## 2017-01-28 ENCOUNTER — Other Ambulatory Visit: Payer: Self-pay | Admitting: Physician Assistant

## 2017-01-28 DIAGNOSIS — M797 Fibromyalgia: Secondary | ICD-10-CM

## 2017-01-30 ENCOUNTER — Other Ambulatory Visit: Payer: Self-pay | Admitting: Physician Assistant

## 2017-01-30 DIAGNOSIS — M797 Fibromyalgia: Secondary | ICD-10-CM

## 2017-02-05 ENCOUNTER — Encounter: Payer: Self-pay | Admitting: Physician Assistant

## 2017-02-05 DIAGNOSIS — G894 Chronic pain syndrome: Secondary | ICD-10-CM

## 2017-02-11 DIAGNOSIS — M797 Fibromyalgia: Secondary | ICD-10-CM | POA: Insufficient documentation

## 2017-02-11 DIAGNOSIS — M47812 Spondylosis without myelopathy or radiculopathy, cervical region: Secondary | ICD-10-CM | POA: Insufficient documentation

## 2017-02-11 DIAGNOSIS — M47816 Spondylosis without myelopathy or radiculopathy, lumbar region: Secondary | ICD-10-CM | POA: Insufficient documentation

## 2017-02-18 ENCOUNTER — Other Ambulatory Visit: Payer: Self-pay | Admitting: Physician Assistant

## 2017-02-18 DIAGNOSIS — M797 Fibromyalgia: Secondary | ICD-10-CM

## 2017-02-26 ENCOUNTER — Other Ambulatory Visit: Payer: Self-pay | Admitting: Physician Assistant

## 2017-02-26 DIAGNOSIS — M797 Fibromyalgia: Secondary | ICD-10-CM

## 2017-02-27 ENCOUNTER — Other Ambulatory Visit: Payer: Self-pay | Admitting: Physician Assistant

## 2017-02-27 DIAGNOSIS — M797 Fibromyalgia: Secondary | ICD-10-CM

## 2017-03-04 ENCOUNTER — Ambulatory Visit (INDEPENDENT_AMBULATORY_CARE_PROVIDER_SITE_OTHER): Payer: Managed Care, Other (non HMO) | Admitting: Physician Assistant

## 2017-03-04 ENCOUNTER — Encounter: Payer: Self-pay | Admitting: Physician Assistant

## 2017-03-04 VITALS — BP 128/76 | HR 78 | Temp 98.5°F | Resp 16 | Ht 67.0 in | Wt 215.0 lb

## 2017-03-04 DIAGNOSIS — M797 Fibromyalgia: Secondary | ICD-10-CM

## 2017-03-04 DIAGNOSIS — G8929 Other chronic pain: Secondary | ICD-10-CM

## 2017-03-04 DIAGNOSIS — Z130 Encounter for screening for diseases of the blood and blood-forming organs and certain disorders involving the immune mechanism: Secondary | ICD-10-CM

## 2017-03-04 DIAGNOSIS — R7309 Other abnormal glucose: Secondary | ICD-10-CM

## 2017-03-04 DIAGNOSIS — E782 Mixed hyperlipidemia: Secondary | ICD-10-CM

## 2017-03-04 DIAGNOSIS — Z9884 Bariatric surgery status: Secondary | ICD-10-CM | POA: Diagnosis not present

## 2017-03-04 DIAGNOSIS — I10 Essential (primary) hypertension: Secondary | ICD-10-CM

## 2017-03-04 DIAGNOSIS — E559 Vitamin D deficiency, unspecified: Secondary | ICD-10-CM

## 2017-03-04 DIAGNOSIS — Z79899 Other long term (current) drug therapy: Secondary | ICD-10-CM

## 2017-03-04 DIAGNOSIS — Z720 Tobacco use: Secondary | ICD-10-CM

## 2017-03-04 MED ORDER — VALACYCLOVIR HCL 500 MG PO TABS: 500.0000 mg | ORAL_TABLET | Freq: Every day | ORAL | 0 refills | Status: AC

## 2017-03-04 NOTE — Patient Instructions (Signed)
Myofascial Pain Syndrome and Fibromyalgia Myofascial pain syndrome and fibromyalgia are both pain disorders. This pain may be felt mainly in your muscles.  Myofascial pain syndrome: ? Always has trigger points or tender points in the muscle that will cause pain when pressed. The pain may come and go. ? Usually affects your neck, upper back, and shoulder areas. The pain often radiates into your arms and hands.  Fibromyalgia: ? Has muscle pains and tenderness that come and go. ? Is often associated with fatigue and sleep disturbances. ? Has trigger points. ? Tends to be long-lasting (chronic), but is not life-threatening.  Fibromyalgia and myofascial pain are not the same. However, they often occur together. If you have both conditions, each can make the other worse. Both are common and can cause enough pain and fatigue to make day-to-day activities difficult. What are the causes? The exact causes of fibromyalgia and myofascial pain are not known. People with certain gene types may be more likely to develop fibromyalgia. Some factors can be triggers for both conditions, such as:  Spine disorders.  Arthritis.  Severe injury (trauma) and other physical stressors.  Being under a lot of stress.  A medical illness.  What are the signs or symptoms? Fibromyalgia The main symptom of fibromyalgia is widespread pain and tenderness in your muscles. This can vary over time. Pain is sometimes described as stabbing, shooting, or burning. You may have tingling or numbness, too. You may also have sleep problems and fatigue. You may wake up feeling tired and groggy (fibro fog). Other symptoms may include:  Bowel and bladder problems.  Headaches.  Visual problems.  Problems with odors and noises.  Depression or mood changes.  Painful menstrual periods (dysmenorrhea).  Dry skin or eyes.  Myofascial pain syndrome Symptoms of myofascial pain syndrome include:  Tight, ropy bands of  muscle.  Uncomfortable sensations in muscular areas, such as: ? Aching. ? Cramping. ? Burning. ? Numbness. ? Tingling. ? Muscle weakness.  Trouble moving certain muscles freely (range of motion).  How is this diagnosed? There are no specific tests to diagnose fibromyalgia or myofascial pain syndrome. Both can be hard to diagnose because their symptoms are common in many other conditions. Your health care provider may suspect one or both of these conditions based on your symptoms and medical history. Your health care provider will also do a physical exam. The key to diagnosing fibromyalgia is having pain, fatigue, and other symptoms for more than three months that cannot be explained by another condition. The key to diagnosing myofascial pain syndrome is finding trigger points in muscles that are tender and cause pain elsewhere in your body (referred pain). How is this treated? Treating fibromyalgia and myofascial pain often requires a team of health care providers. This usually starts with your primary provider and a physical therapist. You may also find it helpful to work with alternative health care providers, such as massage therapists or acupuncturists. Treatment for fibromyalgia may include medicines. This may include nonsteroidal anti-inflammatory drugs (NSAIDs), along with other medicines. Treatment for myofascial pain may also include:  NSAIDs.  Cooling and stretching of muscles.  Trigger point injections.  Sound wave (ultrasound) treatments to stimulate muscles.  Follow these instructions at home:  Take medicines only as directed by your health care provider.  Exercise as directed by your health care provider or physical therapist.  Try to avoid stressful situations.  Practice relaxation techniques to control your stress. You may want to try: ? Biofeedback. ? Visual   imagery. ? Hypnosis. ? Muscle relaxation. ? Yoga. ? Meditation.  Talk to your health care provider  about alternative treatments, such as acupuncture or massage treatment.  Maintain a healthy lifestyle. This includes eating a healthy diet and getting enough sleep.  Consider joining a support group.  Do not do activities that stress or strain your muscles. That includes repetitive motions and heavy lifting. Where to find more information:  National Fibromyalgia Association: www.fmaware.org  Arthritis Foundation: www.arthritis.org  American Chronic Pain Association: www.theacpa.org/condition/myofascial-pain Contact a health care provider if:  You have new symptoms.  Your symptoms get worse.  You have side effects from your medicines.  You have trouble sleeping.  Your condition is causing depression or anxiety. This information is not intended to replace advice given to you by your health care provider. Make sure you discuss any questions you have with your health care provider. Document Released: 01/01/2005 Document Revised: 06/09/2015 Document Reviewed: 10/07/2013 Elsevier Interactive Patient Education  2018 Elsevier Inc.  

## 2017-03-04 NOTE — Progress Notes (Signed)
Assessment and Plan:  Hypertension -Continue medication, monitor blood pressure at home. Continue DASH diet.  Reminder to go to the ER if any CP, SOB, nausea, dizziness, severe HA, changes vision/speech, left arm numbness and tingling and jaw pain.   Cholesterol -Continue diet and exercise. Check cholesterol.   Prediabetes  -Continue diet and exercise. Check A1C   Vitamin D Def - check level and continue medications.    FM/chronic pain will continue to prescribe tramadol, has been on for 7 years without increase or abuse, long discussion that if ever over use will refer to pain management.  Going to see rheum, has axial pain symptoms, will get ESR/CRP today but will let rheum get the rest of the labs  Patient is s/p gastric bypass - Check labs to do to screen for vitamin deficiencies associated with gastric bypass including Vitamin D, B12, iron, RBC magnesium. Recommend strict diet and exercise.    Continue diet and meds as discussed. Further disposition pending results of labs. Over 30 minutes of exam, counseling, chart review, and critical decision making was performed Future Appointments  Date Time Provider [place]  04/26/2017  8:15 AM Wendy Merino, MD [facility] None  09/02/2017  9:00 AM Wendy Mutters, PA-C [facility] None    HPI 52 y.o. female  presents for 3 month follow up on hypertension, cholesterol, prediabetes, and vitamin D deficiency.   Her blood pressure has been controlled at home, today their BP is BP: 128/76  She does workout. She denies chest pain, shortness of breath, dizziness.  She is not on cholesterol medication and denies myalgias. Her cholesterol is at goal. The cholesterol last visit was:   Lab Results  Component Value Date   CHOL 119 08/30/2016   HDL 60 08/30/2016   LDLCALC 49 03/05/2016   TRIG 61 08/30/2016   CHOLHDL 2.0 08/30/2016    She has been working on diet and exercise for prediabetes, and denies paresthesia of the feet,  polydipsia, polyuria and visual disturbances. Last A1C in the office was:  Lab Results  Component Value Date   HGBA1C 5.8 (H) 08/30/2016   Patient is on Vitamin D supplement.   Lab Results  Component Value Date   VD25OH 25 (L) 08/30/2016   She is on tramadol for FM, takes 4 a day occ 3 a day and has been on it for 7 years, has never over used it.  She has tried and failed lyrica and topamax. She is on Cymbalta and gabapentin and increasing walking after seeing pain management.  Seeing bariatric clinics. She has pain thigh, hips, across shoulders, had normal xrays. Worse with sitting for a long time. Has follow up with rheum April 12th will let them get labs.    BMI is Body mass index is 33.67 kg/m., she is working on diet and exercise. Wt Readings from Last 3 Encounters:  03/04/17 215 lb (97.5 kg)  11/20/16 208 lb 12.8 oz (94.7 kg)  10/23/16 213 lb 3.2 oz (96.7 kg)     Current Medications:  Current Outpatient Medications on File Prior to Visit  Medication Sig Dispense Refill  . Aspirin-Acetaminophen-Caffeine (EXCEDRIN EXTRA STRENGTH PO) Take by mouth daily as needed.    Marland Kitchen augmented betamethasone dipropionate (DIPROLENE-AF) 0.05 % ointment Apply topically daily. 50 g 3  . DULoxetine (CYMBALTA) 60 MG capsule Take 1 capsule (60 mg total) daily by mouth. 90 capsule 1  . gabapentin (NEURONTIN) 300 MG capsule TAKE ONE CAPSULE BY MOUTH THREE TIMES A DAY 90 capsule  1  . Glucosamine-Chondroitin (COSAMIN DS PO) Take 1,500 mg by mouth daily.    Marland Kitchen lisinopril-hydrochlorothiazide (PRINZIDE,ZESTORETIC) 20-12.5 MG tablet TAKE 1 TABLET BY MOUTH EVERY DAY 90 tablet 0  . LORazepam (ATIVAN) 2 MG tablet TAKE ONE-HALF TO ONE TABLET BY MOUTH AT BEDITME AS NEEDED FOR SLEEP. DO NOT EXCEED 5 TABLETS PER WEEK 30 tablet 0  . metoCLOPramide (REGLAN) 10 MG tablet TAKE ONE TABLET BY MOUTH ONCE DAILY 90 tablet 0  . Multiple Vitamins-Minerals (MULTIVITAMIN WITH MINERALS) tablet Take 1 tablet by mouth daily. Bariatric  multi vitamin.    . traMADol (ULTRAM) 50 MG tablet TAKE TWO TABLETS BY MOUTH TWICE A DAY 120 tablet 0  . Vitamin D, Ergocalciferol, (DRISDOL) 50000 units CAPS capsule TAKE ONE CAPSULE BY MOUTH TWICE WEEKLY 30 capsule 0   No current facility-administered medications on file prior to visit.    Medical History:  Past Medical History:  Diagnosis Date  . Anemia   . Arthritis   . Fibromyalgia   . Hypertension   . IBS (irritable bowel syndrome)   . Nephrolithiasis   . Plantar fasciitis of right foot    wears boot    Allergies: No Known Allergies   Review of Systems:  Review of Systems  Constitutional: Positive for malaise/fatigue. Negative for chills, diaphoresis, fever and weight loss.  Eyes: Negative.   Respiratory: Negative.   Cardiovascular: Negative.   Gastrointestinal: Negative.   Genitourinary: Negative.   Musculoskeletal: Positive for back pain, joint pain, myalgias and neck pain. Negative for falls.  Skin: Negative.   Neurological: Negative.  Negative for weakness.  Psychiatric/Behavioral: Negative.     Family history- Review and unchanged Social history- Review and unchanged Physical Exam: BP 128/76   Pulse 78   Temp 98.5 F (36.9 C)   Resp 16   Ht '5\' 7"'  (1.702 m)   Wt 215 lb (97.5 kg)   SpO2 96%   BMI 33.67 kg/m  Wt Readings from Last 3 Encounters:  03/04/17 215 lb (97.5 kg)  11/20/16 208 lb 12.8 oz (94.7 kg)  10/23/16 213 lb 3.2 oz (96.7 kg)   General Appearance: Well nourished, in no apparent distress. Eyes: PERRLA, EOMs, conjunctiva no swelling or erythema Sinuses: No Frontal/maxillary tenderness ENT/Mouth: Ext aud canals clear, TMs without erythema, bulging. No erythema, swelling, or exudate on post pharynx.  Tonsils not swollen or erythematous. Hearing normal.  Neck: Supple, thyroid normal.  Respiratory: Respiratory effort normal, BS equal bilaterally without rales, rhonchi, wheezing or stridor.  Cardio: RRR with no MRGs. Brisk peripheral pulses  without edema.  Abdomen: Soft, + BS,  Non tender, no guarding, rebound, hernias, masses. Lymphatics: Non tender without lymphadenopathy.  Musculoskeletal: Full ROM, 5/5 strength, Normal gait Skin: Warm, dry without rashes, lesions, ecchymosis.  Neuro: Cranial nerves intact. Normal muscle tone, no cerebellar symptoms. Psych: Awake and oriented X 3, normal affect, Insight and Judgment appropriate.    Wendy Mutters, PA-C 4:49 PM [facility]

## 2017-03-07 ENCOUNTER — Encounter: Payer: Self-pay | Admitting: Physician Assistant

## 2017-03-07 ENCOUNTER — Encounter: Payer: Self-pay | Admitting: Gastroenterology

## 2017-03-07 DIAGNOSIS — Z1211 Encounter for screening for malignant neoplasm of colon: Secondary | ICD-10-CM

## 2017-03-07 LAB — TSH: TSH: 0.86 mIU/L

## 2017-03-07 LAB — LIPID PANEL
CHOLESTEROL: 133 mg/dL (ref <200)
HDL: 58 mg/dL (ref >50)
LDL CHOLESTEROL (CALC): 59 mg/dL
Non-HDL Cholesterol (Calc): 75 mg/dL (calc) (ref <130)
TRIGLYCERIDES: 77 mg/dL (ref <150)
Total CHOL/HDL Ratio: 2.3 (calc) (ref <5.0)

## 2017-03-07 LAB — SEDIMENTATION RATE: SED RATE: 22 mm/h — AB (ref 0–20)

## 2017-03-07 LAB — CBC WITH DIFFERENTIAL/PLATELET
BASOS PCT: 0.5 %
Basophils Absolute: 30 cells/uL (ref 0–200)
EOS ABS: 78 {cells}/uL (ref 15–500)
EOS PCT: 1.3 %
HCT: 37 % (ref 35.0–45.0)
Hemoglobin: 12.1 g/dL (ref 11.7–15.5)
Lymphs Abs: 1908 cells/uL (ref 850–3900)
MCH: 27.1 pg (ref 27.0–33.0)
MCHC: 32.7 g/dL (ref 32.0–36.0)
MCV: 83 fL (ref 80.0–100.0)
MPV: 9.9 fL (ref 7.5–12.5)
Monocytes Relative: 9.5 %
Neutro Abs: 3414 cells/uL (ref 1500–7800)
Neutrophils Relative %: 56.9 %
PLATELETS: 394 10*3/uL (ref 140–400)
RBC: 4.46 10*6/uL (ref 3.80–5.10)
RDW: 14.8 % (ref 11.0–15.0)
TOTAL LYMPHOCYTE: 31.8 %
WBC mixed population: 570 cells/uL (ref 200–950)
WBC: 6 10*3/uL (ref 3.8–10.8)

## 2017-03-07 LAB — HEMOGLOBIN A1C
HEMOGLOBIN A1C: 5.8 %{Hb} — AB (ref <5.7)
Mean Plasma Glucose: 120 (calc)
eAG (mmol/L): 6.6 (calc)

## 2017-03-07 LAB — HEPATIC FUNCTION PANEL
AG Ratio: 1.7 (calc) (ref 1.0–2.5)
ALBUMIN MSPROF: 4.3 g/dL (ref 3.6–5.1)
ALKALINE PHOSPHATASE (APISO): 144 U/L — AB (ref 33–130)
ALT: 14 U/L (ref 6–29)
AST: 16 U/L (ref 10–35)
Bilirubin, Direct: 0.1 mg/dL (ref 0.0–0.2)
Globulin: 2.5 g/dL (calc) (ref 1.9–3.7)
Indirect Bilirubin: 0.2 mg/dL (calc) (ref 0.2–1.2)
TOTAL PROTEIN: 6.8 g/dL (ref 6.1–8.1)
Total Bilirubin: 0.3 mg/dL (ref 0.2–1.2)

## 2017-03-07 LAB — BASIC METABOLIC PANEL WITH GFR
BUN: 13 mg/dL (ref 7–25)
CALCIUM: 9.6 mg/dL (ref 8.6–10.4)
CO2: 27 mmol/L (ref 20–32)
Chloride: 107 mmol/L (ref 98–110)
Creat: 0.68 mg/dL (ref 0.50–1.05)
GFR, EST AFRICAN AMERICAN: 118 mL/min/{1.73_m2} (ref >=60)
GFR, EST NON AFRICAN AMERICAN: 102 mL/min/{1.73_m2} (ref >=60)
Glucose, Bld: 97 mg/dL (ref 65–99)
Potassium: 4 mmol/L (ref 3.5–5.3)
Sodium: 140 mmol/L (ref 135–146)

## 2017-03-07 LAB — VITAMIN D 25 HYDROXY (VIT D DEFICIENCY, FRACTURES): VIT D 25 HYDROXY: 60 ng/mL (ref 30–100)

## 2017-03-07 LAB — VITAMIN B12: VITAMIN B 12: 707 pg/mL (ref 200–1100)

## 2017-03-07 LAB — IRON, TOTAL/TOTAL IRON BINDING CAP
%SAT: 10 % (calc) — ABNORMAL LOW (ref 11–50)
Iron: 39 ug/dL — ABNORMAL LOW (ref 45–160)
TIBC: 382 mcg/dL (calc) (ref 250–450)

## 2017-03-07 LAB — MAGNESIUM, RBC: MAGNESIUM RBC: 6.1 mg/dL (ref 4.0–6.4)

## 2017-03-07 LAB — C-REACTIVE PROTEIN: CRP: 0.9 mg/L (ref <8.0)

## 2017-03-07 LAB — FERRITIN: FERRITIN: 7 ng/mL — AB (ref 10–232)

## 2017-03-07 LAB — FOLATE RBC: RBC FOLATE: 532 ng/mL (ref >280)

## 2017-03-20 ENCOUNTER — Other Ambulatory Visit: Payer: Self-pay | Admitting: Adult Health

## 2017-03-28 ENCOUNTER — Other Ambulatory Visit: Payer: Self-pay | Admitting: Physician Assistant

## 2017-03-28 DIAGNOSIS — M797 Fibromyalgia: Secondary | ICD-10-CM

## 2017-04-01 ENCOUNTER — Other Ambulatory Visit: Payer: Self-pay | Admitting: Physician Assistant

## 2017-04-01 ENCOUNTER — Encounter: Payer: Self-pay | Admitting: Physician Assistant

## 2017-04-01 DIAGNOSIS — M797 Fibromyalgia: Secondary | ICD-10-CM

## 2017-04-12 NOTE — Progress Notes (Deleted)
Office Visit Note  Patient: Wendy Valencia             Date of Birth: [birth year]           MRN: [id number]             PCP: Lucky Cowboy, MD Referring: Quentin Mulling, PA-C Visit Date: 04/26/2017 Occupation: @GUAROCC @    Subjective:  No chief complaint on file.   History of Present Illness: Wendy Valencia is a 52 y.o. female ***   Activities of Daily Living:  Patient reports morning stiffness for *** {minute/hour:19697}.   Patient {ACTIONS;DENIES/REPORTS:21021675::"Denies"} nocturnal pain.  Difficulty dressing/grooming: {ACTIONS;DENIES/REPORTS:21021675::"Denies"} Difficulty climbing stairs: {ACTIONS;DENIES/REPORTS:21021675::"Denies"} Difficulty getting out of chair: {ACTIONS;DENIES/REPORTS:21021675::"Denies"} Difficulty using hands for taps, buttons, cutlery, and/or writing: {ACTIONS;DENIES/REPORTS:21021675::"Denies"}   No Rheumatology ROS completed.   PMFS History:  Patient Active Problem List   Diagnosis Date Noted  . Chronic pain 04/01/2017  . Fibromyalgia 08/30/2014  . Medication management 08/30/2014  . Vitamin D deficiency 08/30/2014  . S/P gastric bypass 08/30/2014  . Prediabetes 01/01/2014  . Insomnia 01/01/2014  . Morbid obesity (HCC) 06/15/2011  . Tobacco abuse 10/11/2010  . Anxiety state 05/12/2008  . Essential hypertension 05/12/2008    Past Medical History:  Diagnosis Date  . Anemia   . Arthritis   . Fibromyalgia   . Hypertension   . IBS (irritable bowel syndrome)   . Nephrolithiasis   . Plantar fasciitis of right foot    wears boot     Family History  Problem Relation Age of Onset  . Heart attack Maternal Grandfather   . Heart disease Maternal Grandfather   . Heart attack Paternal Grandfather   . Breast cancer Mother 56       breast  . Cancer Mother 5       breast/ ovarian  . Obesity Brother   . Breast cancer Maternal Aunt 60       died at age 67  . Cancer Maternal Aunt        breast  . Lung cancer Maternal Grandmother    . Cancer Maternal Grandmother        ovarian  . Heart disease Father   . Hypertension Father   . Stroke Father   . Multiple sclerosis Maternal Uncle    Past Surgical History:  Procedure Laterality Date  . BREATH TEK H PYLORI  07/11/2011   Procedure: BREATH TEK H PYLORI;  Surgeon: Mariella Saa, MD;  Location: [facility];  Service: General;  Laterality: N/A;  . CHOLECYSTECTOMY  10/01/2011   Procedure: LAPAROSCOPIC CHOLECYSTECTOMY WITH INTRAOPERATIVE CHOLANGIOGRAM;  Surgeon: Mariella Saa, MD;  Location: [facility];  Service: General;  Laterality: N/A;  . GASTRIC ROUX-EN-Y  10/01/2011   Procedure: LAPAROSCOPIC ROUX-EN-Y GASTRIC;  Surgeon: Mariella Saa, MD;  Location: [facility];  Service: General;  Laterality: N/A;  . Left shoulder arthroscopic surgery  yrs ago  . tummy tuck  2005   Social History   Social History Narrative  . Not on file     Objective: Vital Signs: There were no vitals taken for this visit.   Physical Exam   Musculoskeletal Exam: ***  CDAI Exam: No CDAI exam completed.    Investigation: No additional findings. CBC Latest Ref Rng & Units 03/04/2017 08/30/2016 03/05/2016  WBC 3.8 - 10.8 Thousand/uL 6.0 5.5 6.3  Hemoglobin 11.7 - 15.5 g/dL 01.0 27.2 53.6  Hematocrit 35.0 - 45.0 % 37.0 39.1 41.3  Platelets 140 - 400 Thousand/uL 394 337  332   CMP Latest Ref Rng & Units 03/04/2017 08/30/2016 03/05/2016  Glucose 65 - 99 mg/dL 97 98 92  BUN 7 - 25 mg/dL 13 11 15   Creatinine 0.50 - 1.05 mg/dL 2.950.68 2.840.73 1.320.71  Sodium 135 - 146 mmol/L 140 139 142  Potassium 3.5 - 5.3 mmol/L 4.0 3.9 3.6  Chloride 98 - 110 mmol/L 107 107 108  CO2 20 - 32 mmol/L 27 21 23   Calcium 8.6 - 10.4 mg/dL 9.6 9.2 9.4  Total Protein 6.1 - 8.1 g/dL 6.8 6.7 7.1  Total Bilirubin 0.2 - 1.2 mg/dL 0.3 0.4 0.4  Alkaline Phos 33 - 115 U/L - - 123(H)  AST 10 - 35 U/L 16 15 18   ALT 6 - 29 U/L 14 15 15     Imaging: No results found.  Speciality Comments: No specialty comments  available.    Procedures:  No procedures performed Allergies: Patient has no known allergies.   Assessment / Plan:     Visit Diagnoses: Polyarthralgia  Fibromyalgia - Tramadol, Gabapentin, Cymbalta  Other insomnia  Myalgia  Vitamin D deficiency  Essential hypertension  Prediabetes  S/P gastric bypass    Orders: No orders of the defined types were placed in this encounter.  No orders of the defined types were placed in this encounter.   Face-to-face time spent with patient was *** minutes. 50% of time was spent in counseling and coordination of care.  Follow-Up Instructions: No follow-ups on file.   Wendy Bienenstockaylor M Lachlan Pelto, PA-C  Note - This record has been created using Dragon software.  Chart creation errors have been sought, but may not always  have been located. Such creation errors do not reflect on  the standard of medical care.

## 2017-04-15 ENCOUNTER — Encounter: Payer: Self-pay | Admitting: Nurse Practitioner

## 2017-04-15 ENCOUNTER — Other Ambulatory Visit: Payer: Self-pay

## 2017-04-15 ENCOUNTER — Ambulatory Visit: Payer: Managed Care, Other (non HMO) | Attending: Nurse Practitioner | Admitting: Nurse Practitioner

## 2017-04-15 VITALS — BP 129/91 | HR 89 | Temp 98.5°F | Ht 67.0 in | Wt 205.0 lb

## 2017-04-15 DIAGNOSIS — F419 Anxiety disorder, unspecified: Secondary | ICD-10-CM | POA: Insufficient documentation

## 2017-04-15 DIAGNOSIS — Z9884 Bariatric surgery status: Secondary | ICD-10-CM | POA: Insufficient documentation

## 2017-04-15 DIAGNOSIS — M25512 Pain in left shoulder: Secondary | ICD-10-CM | POA: Insufficient documentation

## 2017-04-15 DIAGNOSIS — I10 Essential (primary) hypertension: Secondary | ICD-10-CM | POA: Diagnosis not present

## 2017-04-15 DIAGNOSIS — Z9889 Other specified postprocedural states: Secondary | ICD-10-CM | POA: Diagnosis not present

## 2017-04-15 DIAGNOSIS — M25552 Pain in left hip: Secondary | ICD-10-CM | POA: Insufficient documentation

## 2017-04-15 DIAGNOSIS — Z87891 Personal history of nicotine dependence: Secondary | ICD-10-CM | POA: Diagnosis not present

## 2017-04-15 DIAGNOSIS — M899 Disorder of bone, unspecified: Secondary | ICD-10-CM

## 2017-04-15 DIAGNOSIS — Z79899 Other long term (current) drug therapy: Secondary | ICD-10-CM | POA: Diagnosis not present

## 2017-04-15 DIAGNOSIS — M47812 Spondylosis without myelopathy or radiculopathy, cervical region: Secondary | ICD-10-CM | POA: Insufficient documentation

## 2017-04-15 DIAGNOSIS — G4700 Insomnia, unspecified: Secondary | ICD-10-CM | POA: Diagnosis not present

## 2017-04-15 DIAGNOSIS — R7303 Prediabetes: Secondary | ICD-10-CM | POA: Insufficient documentation

## 2017-04-15 DIAGNOSIS — M1990 Unspecified osteoarthritis, unspecified site: Secondary | ICD-10-CM | POA: Diagnosis not present

## 2017-04-15 DIAGNOSIS — Z79891 Long term (current) use of opiate analgesic: Secondary | ICD-10-CM | POA: Diagnosis not present

## 2017-04-15 DIAGNOSIS — Z9049 Acquired absence of other specified parts of digestive tract: Secondary | ICD-10-CM | POA: Diagnosis not present

## 2017-04-15 DIAGNOSIS — G894 Chronic pain syndrome: Secondary | ICD-10-CM | POA: Diagnosis not present

## 2017-04-15 DIAGNOSIS — M797 Fibromyalgia: Secondary | ICD-10-CM | POA: Diagnosis not present

## 2017-04-15 DIAGNOSIS — M47896 Other spondylosis, lumbar region: Secondary | ICD-10-CM | POA: Diagnosis not present

## 2017-04-15 DIAGNOSIS — Z789 Other specified health status: Secondary | ICD-10-CM

## 2017-04-15 DIAGNOSIS — R05 Cough: Secondary | ICD-10-CM | POA: Diagnosis not present

## 2017-04-15 DIAGNOSIS — G8929 Other chronic pain: Secondary | ICD-10-CM | POA: Insufficient documentation

## 2017-04-15 DIAGNOSIS — Z7982 Long term (current) use of aspirin: Secondary | ICD-10-CM | POA: Insufficient documentation

## 2017-04-15 DIAGNOSIS — I2510 Atherosclerotic heart disease of native coronary artery without angina pectoris: Secondary | ICD-10-CM | POA: Diagnosis not present

## 2017-04-15 DIAGNOSIS — R52 Pain, unspecified: Secondary | ICD-10-CM | POA: Diagnosis not present

## 2017-04-15 DIAGNOSIS — M25551 Pain in right hip: Secondary | ICD-10-CM

## 2017-04-15 DIAGNOSIS — M25511 Pain in right shoulder: Secondary | ICD-10-CM | POA: Diagnosis not present

## 2017-04-15 NOTE — Progress Notes (Signed)
Patient's Name: Wendy Valencia  MRN: [id number]  Referring Provider: Vicie Mutters, PA-C  DOB: [birth year]  PCP: Unk Pinto, MD  DOS: 04/15/2017  Note by: Dionisio David NP  Service setting: Ambulatory outpatient  Specialty: Interventional Pain Management  Location: [facility] (AMB) Pain Management Facility    Patient type: New Patient    Primary Reason(s) for Visit: Initial Patient Evaluation CC: Shoulder Pain (bilateral)  HPI  Wendy Valencia is a 52 y.o. year old, female patient, who comes today for an initial evaluation. She has Anxiety state; Essential hypertension; Tobacco abuse; Morbid obesity ([place]); Prediabetes; Insomnia; Chronic pain disorder; Medication management; Vitamin D deficiency; S/P gastric bypass; Chronic pain; Cervical spondylosis without myelopathy; Fibromyalgia affecting multiple sites (Secondary Area of Pain); Spondylosis of lumbar spine; Chronic generalized pain (Primary Area of Pain); Chronic pain syndrome; Long term current use of opiate analgesic; Long term prescription benzodiazepine use; Pharmacologic therapy; Disorder of skeletal system; Problems influencing health status; Chronic pain of both shoulders (Tertiary Area of Pain)(R>L); and Chronic pain of both hips (Fourth Area of Pain) (R>L) on their problem list.. Her primarily concern today is the Shoulder Pain (bilateral)  Pain Assessment: Location: Left, Right Shoulder  Onset: More than a month ago Duration: Chronic pain Quality: Aching, Burning, Constant Severity: 5 /10 (self-reported pain score)  Note: Reported level is compatible with observation.                          Timing: Constant Modifying factors: resting and laying  Onset and Duration: Present longer than 3 months Cause of pain: Unknown Severity: Getting worse, NAS-11 at its worse: 4/10, NAS-11 at its best: 5/10, NAS-11 now: 5/10 and NAS-11 on the average: 6/10 Timing: Morning, Night, During activity or exercise and After a period of  immobility Aggravating Factors: Bending, Kneeling, Prolonged sitting, Prolonged standing, Squatting, Stooping , Walking, Walking uphill and Walking downhill Alleviating Factors: Lying down, Medications, Resting and Walking Associated Problems: Fatigue, Inability to concentrate, Personality changes, Spasms, Temperature changes, Pain that wakes patient up and Pain that does not allow patient to sleep Quality of Pain: Agonizing, Annoying, Burning, Constant, Deep, Disabling, Exhausting, Feeling of weight, Getting longer, Horrible, Hot, Nagging, Pressure-like, Pulsating, Sharp, Shooting, Sickening, Stabbing, Tender, Throbbing, Tingling and Uncomfortable Previous Examinations or Tests: Biopsy, Bone scan, CT scan and MRI scan Previous Treatments: Narcotic medications  The patient comes into the clinics today for the first time for a chronic pain management evaluation. According to the patient her primary area of pain is generalized. She admits that she has been diagnosed with fibromyalgia.She admits that she has been developed this pain for approximately 8 years however the last 2 years has gotten worse.she admits that she has been treated by her primary care with tramadol 100 mg twice dailygabapentin 300 mg twice daily and Cymbalta 60 mg daily. She admits that she does have occasional flare-ups every 3 months.she admits that the flare-up does affect her job. She does work full time. She is concerned that this will affect her job over time. She admits that she is to go see a rheumatologist. She admits that the pain does cause her to be anxious and it affects her sleep. She is currently using lorazepam and Excedrin P.M.she is status post gastric bypass approximately 6 years ago. She admits the fibromyalgia affects her shoulders and hips.  She admits that the pain is worse on the right side and radiates around to the left. She admits that this is  not a constant pain but when it does occur is she describes it as a  heaviness. She admits try to stay ahead of the pain.she is status post right rotator cuff surgery repair. She feels that this was related to lifting excessive weight when she was in her 20s.She was a weight liter and was able to pick up 350 pounds.she admits that the pain is worse at night and in the morning. She admits that is affected by weather.  She admits that her second area of pain is in her hips. She denies any numbness tingling or weakness in her lower extremities. She denies any previous surgeries, interventional therapy or physical therapy.  Today I took the time to provide the patient with information regarding this pain practice. The patient was informed that the practice is divided into two sections: an interventional pain management section, as well as a completely separate and distinct medication management section. I explained that there are procedure days for interventional therapies, and evaluation days for follow-ups and medication management. Because of the amount of documentation required during both, they are kept separated. This means that there is the possibility that she may be scheduled for a procedure on one day, and medication management the next. I have also informed her that because of staffing and facility limitations, this practice will no longer take patients for medication management only. To illustrate the reasons for this, I gave the patient the example of surgeons, and how inappropriate it would be to refer a patient to his/her care, just to write for the post-surgical antibiotics on a surgery done by a different surgeon.   Because interventional pain management is part of the board-certified specialty for the doctors, the patient was informed that joining this practice means that they are open to any and all interventional therapies. I made it clear that this does not mean that they will be forced to have any procedures done. What this means is that I believe  interventional therapies to be essential part of the diagnosis and proper management of chronic pain conditions. Therefore, patients not interested in these interventional alternatives will be better served under the care of a different practitioner.  The patient was also made aware of my Comprehensive Pain Management Safety Guidelines where by joining this practice, they limit all of their nerve blocks and joint injections to those done by our practice, for as long as we are retained to manage their care. Historic Controlled Substance Pharmacotherapy Review  PMP and historical list of controlled substances: lorazepam 2 mg, tramadol 50 mg, phentermine 37.5 mg,clonazepam 1 mghydrocodone/acetaminophen 5/325 mg, Roxicet 5/325 mg 250 mL, Highest opioid analgesic regimen found: Roxicet 5/325 mg oral solution 250 ml quantity 4 days 9oxycodone 60 mg per day roughly) Most recent opioid analgesic: tramadol 50 mg 2 tablets twice a day (fill date 04/01/2017) tramadol 200 mg per day Current opioid analgesics:  tramadol 50 mg 2 tablets twice daily (fill date 04/01/2017) tramadol 200 mg per day Highest recorded MME/day: 93.75 mg/day MME/day: 200 mg/day Medications: The patient did not bring the medication(s) to the appointment, as requested in our "New Patient Package" Pharmacodynamics: Desired effects: Analgesia: The patient reports >50% benefit. Reported improvement in function: The patient reports medication allows her to accomplish basic ADLs. Clinically meaningful improvement in function (CMIF): Sustained CMIF goals met Perceived effectiveness: Described as relatively effective, allowing for increase in activities of daily living (ADL) Undesirable effects: Side-effects or Adverse reactions: None reported Historical Monitoring: The patient  reports that  she does not use drugs. List of all UDS Test(s): Lab Results  Component Value Date   COCAINSCRNUR NONE DETECTED 07/01/2013   THCU NONE DETECTED  07/01/2013   List of all Serum Drug Screening Test(s):  No results found for: AMPHSCRSER, BARBSCRSER, BENZOSCRSER, COCAINSCRSER, PCPSCRSER, PCPQUANT, THCSCRSER, CANNABQUANT, OPIATESCRSER, OXYSCRSER, PROPOXSCRSER Historical Background Evaluation: [place] PDMP: Six (6) year initial data search conducted.             [place] Department of public safety, offender search: Editor, commissioning Information) Non-contributory Risk Assessment Profile: Aberrant behavior: None observed or detected today Risk factors for fatal opioid overdose: None identified today Fatal overdose hazard ratio (HR): Calculation deferred Non-fatal overdose hazard ratio (HR): Calculation deferred Risk of opioid abuse or dependence: 0.7-3.0% with doses ? 36 MME/day and 6.1-26% with doses ? 120 MME/day. Substance use disorder (SUD) risk level: Pending results of Medical Psychology Evaluation for SUD Opioid risk tool (ORT) (Total Score):    ORT Scoring interpretation table:  Score <3 = Low Risk for SUD  Score between 4-7 = Moderate Risk for SUD  Score >8 = High Risk for Opioid Abuse   PHQ-2 Depression Scale:  Total score: 0  PHQ-2 Scoring interpretation table: (Score and probability of major depressive disorder)  Score 0 = No depression  Score 1 = 15.4% Probability  Score 2 = 21.1% Probability  Score 3 = 38.4% Probability  Score 4 = 45.5% Probability  Score 5 = 56.4% Probability  Score 6 = 78.6% Probability   PHQ-9 Depression Scale:  Total score: 0  PHQ-9 Scoring interpretation table:  Score 0-4 = No depression  Score 5-9 = Mild depression  Score 10-14 = Moderate depression  Score 15-19 = Moderately severe depression  Score 20-27 = Severe depression (2.4 times higher risk of SUD and 2.89 times higher risk of overuse)   Pharmacologic Plan: Pending ordered tests and/or consults  Meds  The patient has a current medication list which includes the following prescription(s): aspirin-acetaminophen-caffeine, augmented betamethasone  dipropionate, cetirizine-pseudoephedrine, chorionic gonadotropin, diphenhydramine, duloxetine, ferrous sulfate, gabapentin, ginkgo biloba, glucosamine-chondroitin, cla, lisinopril-hydrochlorothiazide, lorazepam, magnesium, metoclopramide, multivitamin with minerals, phentermine, tramadol, and vitamin d (ergocalciferol).  Current Outpatient Medications on File Prior to Visit  Medication Sig  . Aspirin-Acetaminophen-Caffeine (EXCEDRIN EXTRA STRENGTH PO) Take by mouth daily as needed.  Marland Kitchen augmented betamethasone dipropionate (DIPROLENE-AF) 0.05 % ointment Apply topically daily.  . cetirizine-pseudoephedrine (ZYRTEC-D) 5-120 MG tablet Take 1 tablet by mouth 2 (two) times daily as needed for allergies.  . Chorionic Gonadotropin (HCG IJ) Inject 2 mg as directed 2 (two) times a week.  . diphenhydrAMINE (BENADRYL) 50 MG tablet Take 50 mg by mouth 2 (two) times daily.  . DULoxetine (CYMBALTA) 60 MG capsule Take 1 capsule (60 mg total) daily by mouth.  . ferrous sulfate 325 (65 FE) MG EC tablet Take 325 mg by mouth once.  . gabapentin (NEURONTIN) 300 MG capsule TAKE ONE CAPSULE BY MOUTH THREE TIMES A DAY  . Ginkgo Biloba (GINKOBA PO) Take by mouth once.  . Glucosamine-Chondroitin (COSAMIN DS PO) Take 1,500 mg by mouth daily.  . Linoleic Acid-Sunflower Oil (CLA) [phone] MG CAPS Take 500 capsules by mouth 2 (two) times daily.  Marland Kitchen lisinopril-hydrochlorothiazide (PRINZIDE,ZESTORETIC) 20-12.5 MG tablet TAKE 1 TABLET BY MOUTH EVERY DAY  . LORazepam (ATIVAN) 2 MG tablet TAKE 1/2 (ONE-HALF) TO 1 TABLET BY MOUTH AT BEDTIME AS NEEDED FOR SLEEP. DO NOT EXCEED 5 TABLETS PER WEEK  . magnesium 30 MG tablet Take 30 mg by mouth once.  Marland Kitchen  metoCLOPramide (REGLAN) 10 MG tablet TAKE ONE TABLET BY MOUTH ONCE DAILY  . Multiple Vitamins-Minerals (MULTIVITAMIN WITH MINERALS) tablet Take 1 tablet by mouth daily. Bariatric multi vitamin.  . phentermine 37.5 MG capsule Take 37.5 mg by mouth once.  . traMADol (ULTRAM) 50 MG tablet  TAKE TWO TABLETS BY MOUTH TWICE A DAY  . Vitamin D, Ergocalciferol, (DRISDOL) 50000 units CAPS capsule TAKE ONE CAPSULE BY MOUTH TWICE WEEKLY   No current facility-administered medications on file prior to visit.    Imaging Review   Note: Available results from prior imaging studies were reviewed.        ROS  Cardiovascular History: No reported cardiovascular signs or symptoms such as High blood pressure, coronary artery disease, abnormal heart rate or rhythm, heart attack, blood thinner therapy or heart weakness and/or failure Pulmonary or Respiratory History: Smoking, Coughing up mucus (Bronchitis) and Temporary stoppage of breathing during sleep Neurological History: No reported neurological signs or symptoms such as seizures, abnormal skin sensations, urinary and/or fecal incontinence, being born with an abnormal open spine and/or a tethered spinal cord Review of Past Neurological Studies:  Results for orders placed or performed during the hospital encounter of 09/12/12  CT Head Wo Contrast   Narrative   CLINICAL DATA:  Syncope. Right-sided headache.  EXAM: CT HEAD WITHOUT CONTRAST  TECHNIQUE: Contiguous axial images were obtained from the base of the skull through the vertex without intravenous contrast.  COMPARISON:  04/07/2008  FINDINGS: The brainstem, cerebellum, cerebral peduncles, thalamus, basal ganglia, basilar cisterns, and ventricular system appear within normal limits. No intracranial hemorrhage, mass lesion, or acute CVA.  IMPRESSION: No significant abnormality identified.   Electronically Signed   By: Sherryl Barters   On: 09/12/2012 14:28   Results for orders placed or performed in visit on 06/27/01  MR Brain Wo Contrast   Narrative   FINDINGS CLINICAL DATA:  FRONTAL HEADACHES FOR TWO MONTHS.  NECK PAIN. MRI OF THE BRAIN WITHOUT CONTRAST PERFORMED ON THE OPEN MAGNET MULTIPLANAR T1 AND T2-WEIGHTED IMAGING WAS PERFORMED INCLUDING DIFFUSION IMAGING.   THE STUDY WAS ORDERED AND PERFORMED WITHOUT GADOLINIUM ADMINISTRATION. THE BRAIN HAS A NORMAL APPEARANCE ON ALL PULSE SEQUENCES WITHOUT EVIDENCE OF ATROPHY, STROKE, MASS, HEMORRHAGE, HYDROCEPHALUS OR EXTRA-AXIAL COLLECTION.  DIFFUSION IMAGING IS NORMAL.  THE PITUITARY GLAND IS NORMAL.  THE CAROTID AND VERTEBROBASILAR SYSTEMS ARE PATENT INTO THE BRAIN.  THE PARANASAL SINUSES ARE CLEAR.  NO FLUID IS SEEN IN THE MIDDLE EARS OR MASTOIDS. IMPRESSION NORMAL MRI OF THE BRAIN WITHOUT CONTRAST.  NO LESION IS SEEN TO EXPLAIN THE PATIENT'S HEADACHES.   Psychological-Psychiatric History: Anxiousness, Prone to panicking and Difficulty sleeping and or falling asleep Gastrointestinal History: Alternating episodes iof diarrhea and constipation (IBS-Irritable bowe syndrome) and Irregular, infrequent bowel movements (Constipation) Genitourinary History: Passing kidney stones Hematological History: Weakness due to low blood hemoglobin or red blood cell count (Anemia) Endocrine History: No reported endocrine signs or symptoms such as high or low blood sugar, rapid heart rate due to high thyroid levels, obesity or weight gain due to slow thyroid or thyroid disease Rheumatologic History: Generalized muscle aches (Fibromyalgia) Musculoskeletal History: Negative for myasthenia gravis, muscular dystrophy, multiple sclerosis or malignant hyperthermia Work History: Working part time  Allergies  Wendy Valencia has No Known Allergies.  Laboratory Chemistry  Inflammation Markers Lab Results  Component Value Date   CRP 0.9 03/04/2017   ESRSEDRATE 22 (H) 03/04/2017   (CRP: Acute Phase) (ESR: Chronic Phase) Renal Function Markers Lab Results  Component Value Date  BUN 13 03/04/2017   CREATININE 0.68 03/04/2017   GFRAA 118 03/04/2017   GFRNONAA 102 03/04/2017   Hepatic Function Markers Lab Results  Component Value Date   AST 16 03/04/2017   ALT 14 03/04/2017   ALBUMIN 4.5 03/05/2016   ALKPHOS 123 (H)  03/05/2016   Electrolytes Lab Results  Component Value Date   NA 140 03/04/2017   K 4.0 03/04/2017   CL 107 03/04/2017   CALCIUM 9.6 03/04/2017   MG 2.0 08/30/2016   Neuropathy Markers Lab Results  Component Value Date   VITAMINB12 707 03/04/2017   Bone Pathology Markers Lab Results  Component Value Date   ALKPHOS 123 (H) 03/05/2016   VD25OH 60 03/04/2017   CALCIUM 9.6 03/04/2017   Coagulation Parameters Lab Results  Component Value Date   PLT 394 03/04/2017   Cardiovascular Markers Lab Results  Component Value Date   HGB 12.1 03/04/2017   HCT 37.0 03/04/2017   Note: Lab results reviewed.  PFSH  Drug: Wendy Valencia  reports that she does not use drugs. Alcohol:  reports that she drinks about 0.6 oz of alcohol per week. Tobacco:  reports that she has been smoking cigarettes.  She has a 1.75 pack-year smoking history. She has never used smokeless tobacco. Medical:  has a past medical history of Anemia, Arthritis, Fibromyalgia, Hypertension, IBS (irritable bowel syndrome), Nephrolithiasis, and Plantar fasciitis of right foot. Family: family history includes Breast cancer (age of onset: 74) in her mother; Breast cancer (age of onset: 55) in her maternal aunt; Cancer in her maternal aunt and maternal grandmother; Cancer (age of onset: 3) in her mother; Heart attack in her maternal grandfather and paternal grandfather; Heart disease in her father and maternal grandfather; Hypertension in her father; Lung cancer in her maternal grandmother; Multiple sclerosis in her maternal uncle; Obesity in her brother; Stroke in her father.  Past Surgical History:  Procedure Laterality Date  . BREATH TEK H PYLORI  07/11/2011   Procedure: BREATH TEK H PYLORI;  Surgeon: Edward Jolly, MD;  Location: [facility];  Service: General;  Laterality: N/A;  . CHOLECYSTECTOMY  10/01/2011   Procedure: LAPAROSCOPIC CHOLECYSTECTOMY WITH INTRAOPERATIVE CHOLANGIOGRAM;  Surgeon: Edward Jolly, MD;  Location: [facility];  Service: General;  Laterality: N/A;  . GASTRIC ROUX-EN-Y  10/01/2011   Procedure: LAPAROSCOPIC ROUX-EN-Y GASTRIC;  Surgeon: Edward Jolly, MD;  Location: [facility];  Service: General;  Laterality: N/A;  . Left shoulder arthroscopic surgery  yrs ago  . tummy tuck  2005   Active Ambulatory Problems    Diagnosis Date Noted  . Anxiety state 05/12/2008  . Essential hypertension 05/12/2008  . Tobacco abuse 10/11/2010  . Morbid obesity ([place]) 06/15/2011  . Prediabetes 01/01/2014  . Insomnia 01/01/2014  . Chronic pain disorder 08/30/2014  . Medication management 08/30/2014  . Vitamin D deficiency 08/30/2014  . S/P gastric bypass 08/30/2014  . Chronic pain 04/01/2017  . Cervical spondylosis without myelopathy 02/11/2017  . Fibromyalgia affecting multiple sites (Secondary Area of Pain) 02/11/2017  . Spondylosis of lumbar spine 02/11/2017  . Chronic generalized pain (Primary Area of Pain) 04/15/2017  . Chronic pain syndrome 04/15/2017  . Long term current use of opiate analgesic 04/15/2017  . Long term prescription benzodiazepine use 04/15/2017  . Pharmacologic therapy 04/15/2017  . Disorder of skeletal system 04/15/2017  . Problems influencing health status 04/15/2017  . Chronic pain of both shoulders (Tertiary Area of Pain)(R>L) 04/15/2017  . Chronic pain of both hips (Fourth Area  of Pain) (R>L) 04/15/2017   Resolved Ambulatory Problems    Diagnosis Date Noted  . No Resolved Ambulatory Problems   Past Medical History:  Diagnosis Date  . Anemia   . Arthritis   . Fibromyalgia   . Hypertension   . IBS (irritable bowel syndrome)   . Nephrolithiasis   . Plantar fasciitis of right foot    Constitutional Exam  General appearance: Well nourished, well developed, and well hydrated. In no apparent acute distress Vitals:   04/15/17 0948  BP: (!) 129/91  Pulse: 89  Temp: 98.5 F (36.9 C)  SpO2: 100%  Weight: 205 lb (93 kg)  Height: 5' 7" (1.702 m)    BMI Assessment: Estimated body mass index is 32.11 kg/m as calculated from the following:   Height as of this encounter: 5' 7" (1.702 m).   Weight as of this encounter: 205 lb (93 kg).  BMI interpretation table: BMI level Category Range association with higher incidence of chronic pain  <18 kg/m2 Underweight   18.5-24.9 kg/m2 Ideal body weight   25-29.9 kg/m2 Overweight Increased incidence by 20%  30-34.9 kg/m2 Obese (Class I) Increased incidence by 68%  35-39.9 kg/m2 Severe obesity (Class II) Increased incidence by 136%  >40 kg/m2 Extreme obesity (Class III) Increased incidence by 254%   BMI Readings from Last 4 Encounters:  04/15/17 32.11 kg/m  03/04/17 33.67 kg/m  11/20/16 31.98 kg/m  10/23/16 32.66 kg/m   Wt Readings from Last 4 Encounters:  04/15/17 205 lb (93 kg)  03/04/17 215 lb (97.5 kg)  11/20/16 208 lb 12.8 oz (94.7 kg)  10/23/16 213 lb 3.2 oz (96.7 kg)  Psych/Mental status: Alert, oriented x 3 (person, place, & time)       Eyes: PERLA Respiratory: No evidence of acute respiratory distress  Cervical Spine Exam  Inspection: No masses, redness, or swelling Alignment: Symmetrical Functional ROM: Unrestricted ROM      Stability: No instability detected Muscle strength & Tone: Functionally intact Sensory: Unimpaired Palpation: Complains of area being tender to palpation              Upper Extremity (UE) Exam    Side: Right upper extremity  Side: Left upper extremity  Inspection: No masses, redness, swelling, or asymmetry. No contractures  Inspection: No masses, redness, swelling, or asymmetry. No contractures  Functional ROM: Unrestricted ROM          Functional ROM: Unrestricted ROM          Muscle strength & Tone: Functionally intact  Muscle strength & Tone: Functionally intact  Sensory: Unimpaired  Sensory: Unimpaired  Palpation: No palpable anomalies              Palpation: No palpable anomalies              Specialized Test(s): Deferred          Specialized Test(s): Deferred          Thoracic Spine Exam  Inspection: No masses, redness, or swelling Alignment: Symmetrical Functional ROM: Unrestricted ROM Stability: No instability detected Sensory: Unimpaired Muscle strength & Tone: No palpable anomalies  Lumbar Spine Exam  Inspection: No masses, redness, or swelling Alignment: Symmetrical Functional ROM: Unrestricted ROM      Stability: No instability detected Muscle strength & Tone: Functionally intact Sensory: Unimpaired Palpation: No palpable anomalies       Provocative Tests: Lumbar Hyperextension and rotation test: Negative       Patrick's Maneuver: Negative  Bilateral leg raise is negative Gait & Posture Assessment  Ambulation: Unassisted Gait: Relatively normal for age and body habitus Posture: WNL   Lower Extremity Exam    Side: Right lower extremity  Side: Left lower extremity  Inspection: No masses, redness, swelling, or asymmetry. No contractures  Inspection: No masses, redness, swelling, or asymmetry. No contractures  Functional ROM: Unrestricted ROM          Functional ROM: Unrestricted ROM          Muscle strength & Tone: Functionally intact  Muscle strength & Tone: Functionally intact  Sensory: Unimpaired  Sensory: Unimpaired  Palpation: No palpable anomalies  Palpation: No palpable anomalies   Assessment  Primary Diagnosis & Pertinent Problem List: The primary encounter diagnosis was Chronic generalized pain. Diagnoses of Fibromyalgia affecting multiple sites, Chronic pain of both shoulders, Chronic pain of both hips, Chronic pain syndrome, Long term current use of opiate analgesic, Pharmacologic therapy, Long term prescription benzodiazepine use, Disorder of skeletal system, and Problems influencing health status were also pertinent to this visit.  Visit Diagnosis: 1. Chronic generalized pain   2. Fibromyalgia affecting multiple sites   3. Chronic pain of both shoulders   4. Chronic  pain of both hips   5. Chronic pain syndrome   6. Long term current use of opiate analgesic   7. Pharmacologic therapy   8. Long term prescription benzodiazepine use   9. Disorder of skeletal system   10. Problems influencing health status    Plan of Care  Initial treatment plan:  Please be advised that as per protocol, today's visit has been an evaluation only. We have not taken over the patient's controlled substance management.  Problem-specific plan: No problem-specific Assessment & Plan notes found for this encounter.  Ordered Lab-work, Procedure(s), Referral(s), & Consult(s): Orders Placed This Encounter  Procedures  . Magnesium   Pharmacotherapy: Medications ordered:  No orders of the defined types were placed in this encounter.  Medications administered during this visit: Wendy Valencia had no medications administered during this visit.   Pharmacotherapy under consideration:  Opioid Analgesics: The patient was informed that there is no guarantee that she would be a candidate for opioid analgesics. The decision will be made following CDC guidelines. This decision will be based on the results of diagnostic studies, as well as Wendy Valencia risk profile.  Membrane stabilizer: To be determined at a later time Muscle relaxant: To be determined at a later time NSAID: To be determined at a later time Other analgesic(s): To be determined at a later time   Interventional therapies under consideration: Wendy Valencia was informed that there is no guarantee that she would be a candidate for interventional therapies. The decision will be based on the results of diagnostic studies, as well as Wendy Valencia's risk profile.  Possible procedure(s): Lidocaine infusions Trigger point injections Diagnostic bilateral cervical facet nerve blocks Diagnostic bilateralcervical facet RFA   Provider-requested follow-up: Return for 2nd Visit, w/ Dr. Dossie Arbour, after [employer]  eval.  Future Appointments  Date Time Provider [place]  04/26/2017  8:15 AM Bo Merino, MD [facility] None  04/29/2017  8:00 AM [facility] PREVISIT RM 51 [facility]  05/13/2017  9:30 AM Ladene Artist, MD [facility]  09/02/2017  9:00 AM Vicie Mutters, Randolm Idol None    Primary Care Physician: Unk Pinto, MD Location: [facility] Note by:  Date: 04/15/2017; Time: 3:54 PM  Pain Score Disclaimer: We use the NRS-11 scale. This is  a self-reported, subjective measurement of pain severity with only modest accuracy. It is used primarily to identify changes within a particular patient. It must be understood that outpatient pain scales are significantly less accurate that those used for research, where they can be applied under ideal controlled circumstances with minimal exposure to variables. In reality, the score is likely to be a combination of pain intensity and pain affect, where pain affect describes the degree of emotional arousal or changes in action readiness caused by the sensory experience of pain. Factors such as social and work situation, setting, emotional state, anxiety levels, expectation, and prior pain experience may influence pain perception and show large inter-individual differences that may also be affected by time variables.  Patient instructions provided during this appointment: Patient Instructions   ____________________________________________________________________________________________  Appointment Policy Summary  It is our goal and responsibility to provide the medical community with assistance in the evaluation and management of patients with chronic pain. Unfortunately our resources are limited. Because we do not have an unlimited amount of time, or available appointments, we are required to closely monitor and manage their use. The following rules exist to maximize their use:  Patient's  responsibilities: 1. Punctuality:  At what time should I arrive? You should be physically present in our office 30 minutes before your scheduled appointment. Your scheduled appointment is with your assigned healthcare provider. However, it takes 5-10 minutes to be "checked-in", and another 15 minutes for the nurses to do the admission. If you arrive to our office at the time you were given for your appointment, you will end up being at least 20-25 minutes late to your appointment with the provider. 2. Tardiness:  What happens if I arrive only a few minutes after my scheduled appointment time? You will need to reschedule your appointment. The cutoff is your appointment time. This is why it is so important that you arrive at least 30 minutes before that appointment. If you have an appointment scheduled for 10:00 AM and you arrive at 10:01, you will be required to reschedule your appointment.  3. Plan ahead:  Always assume that you will encounter traffic on your way in. Plan for it. If you are dependent on a driver, make sure they understand these rules and the need to arrive early. 4. Other appointments and responsibilities:  Avoid scheduling any other appointments before or after your pain clinic appointments.  5. Be prepared:  Write down everything that you need to discuss with your healthcare provider and give this information to the admitting nurse. Write down the medications that you will need refilled. Bring your pills and bottles (even the empty ones), to all of your appointments, except for those where a procedure is scheduled. 6. No children or pets:  Find someone to take care of them. It is not appropriate to bring them in. 7. Scheduling changes:  We request "advanced notification" of any changes or cancellations. 8. Advanced notification:  Defined as a time period of more than 24 hours prior to the originally scheduled appointment. This allows for the appointment to be offered to other  patients. 9. Rescheduling:  When a visit is rescheduled, it will require the cancellation of the original appointment. For this reason they both fall within the category of "Cancellations".  10. Cancellations:  They require advanced notification. Any cancellation less than 24 hours before the  appointment will be recorded as a "No Show". 11. No Show:  Defined as an unkept appointment where the patient failed to  notify or declare to the practice their intention or inability to keep the appointment.  Corrective process for repeat offenders:  1. Tardiness: Three (3) episodes of rescheduling due to late arrivals will be recorded as one (1) "No Show". 2. Cancellation or reschedule: Three (3) cancellations or rescheduling will be recorded as one (1) "No Show". 3. "No Shows": Three (3) "No Shows" within a 12 month period will result in discharge from the practice. ____________________________________________________________________________________________  ____________________________________________________________________________________________  Pain Scale  Introduction: The pain score used by this practice is the Verbal Numerical Rating Scale (VNRS-11). This is an 11-point scale. It is for adults and children 10 years or older. There are significant differences in how the pain score is reported, used, and applied. Forget everything you learned in the past and learn this scoring system.  General Information: The scale should reflect your current level of pain. Unless you are specifically asked for the level of your worst pain, or your average pain. If you are asked for one of these two, then it should be understood that it is over the past 24 hours.  Basic Activities of Daily Living (ADL): Personal hygiene, dressing, eating, transferring, and using restroom.  Instructions: Most patients tend to report their level of pain as a combination of two factors, their physical pain and their psychosocial  pain. This last one is also known as "suffering" and it is reflection of how physical pain affects you socially and psychologically. From now on, report them separately. From this point on, when asked to report your pain level, report only your physical pain. Use the following table for reference.  Pain Clinic Pain Levels (0-5/10)  Pain Level Score  Description  No Pain 0   Mild pain 1 Nagging, annoying, but does not interfere with basic activities of daily living (ADL). Patients are able to eat, bathe, get dressed, toileting (being able to get on and off the toilet and perform personal hygiene functions), transfer (move in and out of bed or a chair without assistance), and maintain continence (able to control bladder and bowel functions). Blood pressure and heart rate are unaffected. A normal heart rate for a healthy adult ranges from 60 to 100 bpm (beats per minute).   Mild to moderate pain 2 Noticeable and distracting. Impossible to hide from other people. More frequent flare-ups. Still possible to adapt and function close to normal. It can be very annoying and may have occasional stronger flare-ups. With discipline, patients may get used to it and adapt.   Moderate pain 3 Interferes significantly with activities of daily living (ADL). It becomes difficult to feed, bathe, get dressed, get on and off the toilet or to perform personal hygiene functions. Difficult to get in and out of bed or a chair without assistance. Very distracting. With effort, it can be ignored when deeply involved in activities.   Moderately severe pain 4 Impossible to ignore for more than a few minutes. With effort, patients may still be able to manage work or participate in some social activities. Very difficult to concentrate. Signs of autonomic nervous system discharge are evident: dilated pupils (mydriasis); mild sweating (diaphoresis); sleep interference. Heart rate becomes elevated (>115 bpm). Diastolic blood pressure  (lower number) rises above 100 mmHg. Patients find relief in laying down and not moving.   Severe pain 5 Intense and extremely unpleasant. Associated with frowning face and frequent crying. Pain overwhelms the senses.  Ability to do any activity or maintain social relationships becomes significantly limited. Conversation becomes  difficult. Pacing back and forth is common, as getting into a comfortable position is nearly impossible. Pain wakes you up from deep sleep. Physical signs will be obvious: pupillary dilation; increased sweating; goosebumps; brisk reflexes; cold, clammy hands and feet; nausea, vomiting or dry heaves; loss of appetite; significant sleep disturbance with inability to fall asleep or to remain asleep. When persistent, significant weight loss is observed due to the complete loss of appetite and sleep deprivation.  Blood pressure and heart rate becomes significantly elevated. Caution: If elevated blood pressure triggers a pounding headache associated with blurred vision, then the patient should immediately seek attention at an urgent or emergency care unit, as these may be signs of an impending stroke.    Emergency Department Pain Levels (6-10/10)  Emergency Room Pain 6 Severely limiting. Requires emergency care and should not be seen or managed at an outpatient pain management facility. Communication becomes difficult and requires great effort. Assistance to reach the emergency department may be required. Facial flushing and profuse sweating along with potentially dangerous increases in heart rate and blood pressure will be evident.   Distressing pain 7 Self-care is very difficult. Assistance is required to transport, or use restroom. Assistance to reach the emergency department will be required. Tasks requiring coordination, such as bathing and getting dressed become very difficult.   Disabling pain 8 Self-care is no longer possible. At this level, pain is disabling. The individual is  unable to do even the most "basic" activities such as walking, eating, bathing, dressing, transferring to a bed, or toileting. Fine motor skills are lost. It is difficult to think clearly.   Incapacitating pain 9 Pain becomes incapacitating. Thought processing is no longer possible. Difficult to remember your own name. Control of movement and coordination are lost.   The worst pain imaginable 10 At this level, most patients pass out from pain. When this level is reached, collapse of the autonomic nervous system occurs, leading to a sudden drop in blood pressure and heart rate. This in turn results in a temporary and dramatic drop in blood flow to the brain, leading to a loss of consciousness. Fainting is one of the body's self defense mechanisms. Passing out puts the brain in a calmed state and causes it to shut down for a while, in order to begin the healing process.    Summary: 1. Refer to this scale when providing [place] with your pain level. 2. Be accurate and careful when reporting your pain level. This will help with your care. 3. Over-reporting your pain level will lead to loss of credibility. 4. Even a level of 1/10 means that there is pain and will be treated at our facility. 5. High, inaccurate reporting will be documented as "Symptom Exaggeration", leading to loss of credibility and suspicions of possible secondary gains such as obtaining more narcotics, or wanting to appear disabled, for fraudulent reasons. 6. Only pain levels of 5 or below will be seen at our facility. 7. Pain levels of 6 and above will be sent to the Emergency Department and the appointment cancelled. ____________________________________________________________________________________________

## 2017-04-15 NOTE — Patient Instructions (Signed)

## 2017-04-16 LAB — MAGNESIUM: Magnesium: 2.2 mg/dL (ref 1.6–2.3)

## 2017-04-19 ENCOUNTER — Other Ambulatory Visit: Payer: Self-pay | Admitting: Physician Assistant

## 2017-04-19 DIAGNOSIS — M797 Fibromyalgia: Secondary | ICD-10-CM

## 2017-04-20 LAB — COMPLIANCE DRUG ANALYSIS, UR

## 2017-04-22 ENCOUNTER — Encounter: Payer: Self-pay | Admitting: Nurse Practitioner

## 2017-04-26 ENCOUNTER — Other Ambulatory Visit: Payer: Self-pay | Admitting: Adult Health

## 2017-04-26 ENCOUNTER — Ambulatory Visit: Payer: Self-pay | Admitting: Rheumatology

## 2017-04-26 DIAGNOSIS — M797 Fibromyalgia: Secondary | ICD-10-CM

## 2017-04-29 ENCOUNTER — Other Ambulatory Visit: Payer: Self-pay | Admitting: Physician Assistant

## 2017-04-29 ENCOUNTER — Other Ambulatory Visit: Payer: Self-pay

## 2017-04-29 ENCOUNTER — Encounter: Payer: Self-pay | Admitting: Gastroenterology

## 2017-04-29 ENCOUNTER — Ambulatory Visit (AMBULATORY_SURGERY_CENTER): Payer: Self-pay | Admitting: *Deleted

## 2017-04-29 VITALS — Ht 67.0 in | Wt 211.8 lb

## 2017-04-29 DIAGNOSIS — Z1211 Encounter for screening for malignant neoplasm of colon: Secondary | ICD-10-CM

## 2017-04-29 DIAGNOSIS — M797 Fibromyalgia: Secondary | ICD-10-CM

## 2017-04-29 MED ORDER — NA SULFATE-K SULFATE-MG SULF 17.5-3.13-1.6 GM/177ML PO SOLN: 1.0000 [IU] | Freq: Once | ORAL | 0 refills | Status: AC

## 2017-04-29 NOTE — Progress Notes (Signed)
No egg or soy allergy known to patient  No issues with past sedation with any surgeries  or procedures, no intubation problems  diet pills per patient is taking Phentermine CLA L-Carnitine No home 02 use per patient  No blood thinners per patient  Pt denies issues with constipation on Iron and takes it with Senna Tea No A fib or A flutter  EMMI video sent to pt's e mail

## 2017-05-01 ENCOUNTER — Encounter (HOSPITAL_COMMUNITY): Payer: Self-pay

## 2017-05-13 ENCOUNTER — Ambulatory Visit (AMBULATORY_SURGERY_CENTER): Payer: Managed Care, Other (non HMO) | Admitting: Gastroenterology

## 2017-05-13 ENCOUNTER — Encounter: Payer: Self-pay | Admitting: Gastroenterology

## 2017-05-13 VITALS — BP 144/81 | HR 67 | Temp 98.0°F | Resp 22 | Ht 67.0 in | Wt 205.0 lb

## 2017-05-13 DIAGNOSIS — Z1211 Encounter for screening for malignant neoplasm of colon: Secondary | ICD-10-CM | POA: Diagnosis not present

## 2017-05-13 DIAGNOSIS — Z1212 Encounter for screening for malignant neoplasm of rectum: Secondary | ICD-10-CM | POA: Diagnosis not present

## 2017-05-13 MED ORDER — SODIUM CHLORIDE 0.9 % IV SOLN: 500.0000 mL | Freq: Once | INTRAVENOUS | Status: DC

## 2017-05-13 NOTE — Progress Notes (Signed)
Report to PACU, RN, vss, BBS= Clear.  

## 2017-05-13 NOTE — Patient Instructions (Signed)
YOU HAD AN ENDOSCOPIC PROCEDURE TODAY AT THE [facility]:   Refer to the procedure report that was given to you for any specific questions about what was found during the examination.  If the procedure report does not answer your questions, please call your gastroenterologist to clarify.  If you requested that your care partner not be given the details of your procedure findings, then the procedure report has been included in a sealed envelope for you to review at your convenience later.  YOU SHOULD EXPECT: Some feelings of bloating in the abdomen. Passage of more gas than usual.  Walking can help get rid of the air that was put into your GI tract during the procedure and reduce the bloating. If you had a lower endoscopy (such as a colonoscopy or flexible sigmoidoscopy) you may notice spotting of blood in your stool or on the toilet paper. If you underwent a bowel prep for your procedure, you may not have a normal bowel movement for a few days.  Please Note:  You might notice some irritation and congestion in your nose or some drainage.  This is from the oxygen used during your procedure.  There is no need for concern and it should clear up in a day or so.  SYMPTOMS TO REPORT IMMEDIATELY:   Following lower endoscopy (colonoscopy or flexible sigmoidoscopy):  Excessive amounts of blood in the stool  Significant tenderness or worsening of abdominal pains  Swelling of the abdomen that is new, acute  Fever of 100F or higher  For urgent or emergent issues, a gastroenterologist can be reached at any hour by calling [phone].   DIET:  We do recommend a small meal at first, but then you may proceed to your regular diet.  Drink plenty of fluids but you should avoid alcoholic beverages for 24 hours.  ACTIVITY:  You should plan to take it easy for the rest of today and you should NOT DRIVE or use heavy machinery until tomorrow (because of the sedation medicines used during the test).     FOLLOW UP: Our staff will call the number listed on your records the next business day following your procedure to check on you and address any questions or concerns that you may have regarding the information given to you following your procedure. If we do not reach you, we will leave a message.  However, if you are feeling well and you are not experiencing any problems, there is no need to return our call.  We will assume that you have returned to your regular daily activities without incident.  If any biopsies were taken you will be contacted by phone or by letter within the next 1-3 weeks.  Please call us at [phone] if you have not heard about the biopsies in 3 weeks.    SIGNATURES/CONFIDENTIALITY: You and/or your care partner have signed paperwork which will be entered into your electronic medical record.  These signatures attest to the fact that that the information above on your After Visit Summary has been reviewed and is understood.  Full responsibility of the confidentiality of this discharge information lies with you and/or your care-partner. 

## 2017-05-13 NOTE — Progress Notes (Signed)
Pt's states no medical or surgical changes since previsit or office visit. 

## 2017-05-13 NOTE — Op Note (Signed)
[facility] Patient Name: Wendy Valencia Procedure Date: 05/13/2017 9:20 AM MRN: [id number] Endoscopist: Meryl Dare , MD Age: 52 Referring MD:  Date of Birth: 1965-05-16 Gender: Female Account #: [id number] Procedure:                Colonoscopy Indications:              Screening for colorectal malignant neoplasm Medicines:                Monitored Anesthesia Care Procedure:                Pre-Anesthesia Assessment:                           - Prior to the procedure, a History and Physical                            was performed, and patient medications and                            allergies were reviewed. The patient's tolerance of                            previous anesthesia was also reviewed. The risks                            and benefits of the procedure and the sedation                            options and risks were discussed with the patient.                            All questions were answered, and informed consent                            was obtained. Prior Anticoagulants: The patient has                            taken no previous anticoagulant or antiplatelet                            agents. ASA Grade Assessment: II - A patient with                            mild systemic disease. After reviewing the risks                            and benefits, the patient was deemed in                            satisfactory condition to undergo the procedure.                           After obtaining informed consent, the colonoscope  was passed under direct vision. Throughout the                            procedure, the patient's blood pressure, pulse, and                            oxygen saturations were monitored continuously. The                            Colonoscope was introduced through the anus and                            advanced to the the cecum, identified by                            appendiceal orifice and  ileocecal valve. The                            ileocecal valve, appendiceal orifice, and rectum                            were photographed. The quality of the bowel                            preparation was adequate to identify polyps 6 mm                            and larger in size despite extensive lavage and                            suctioning. Left colon prep was better than right                            colon prep. The colonoscopy was performed without                            difficulty. The patient tolerated the procedure                            well. Scope In: 9:28:17 AM Scope Out: 9:53:16 AM Scope Withdrawal Time: 0 hours 17 minutes 9 seconds  Total Procedure Duration: 0 hours 24 minutes 59 seconds  Findings:                 The perianal and digital rectal examinations were                            normal.                           The entire examined colon appeared normal on direct                            and retroflexion views. Complications:            No  immediate complications. Estimated blood loss:                            None. Estimated Blood Loss:     Estimated blood loss: none. Impression:               - The entire examined colon is normal on direct and                            retroflexion views.                           - No specimens collected. Recommendation:           - Repeat colonoscopy in 5 years for screening                            purposes with an extended bowel prep.                           - Patient has a contact number available for                            emergencies. The signs and symptoms of potential                            delayed complications were discussed with the                            patient. Return to normal activities tomorrow.                            Written discharge instructions were provided to the                            patient.                           - Resume previous diet.                            - Continue present medications. Meryl Dare, MD 05/13/2017 9:56:27 AM This report has been signed electronically.

## 2017-05-14 ENCOUNTER — Telehealth: Payer: Self-pay | Admitting: *Deleted

## 2017-05-14 NOTE — Telephone Encounter (Signed)
No answer left message to call if questions or concerns. 

## 2017-05-14 NOTE — Telephone Encounter (Signed)
  Follow up Call-  Call back number 05/13/2017  Post procedure Call Back phone  # [phone]  Permission to leave phone message Yes  Some recent data might be hidden     Patient questions:  Do you have a fever, pain , or abdominal swelling? No. Pain Score  0 *  Have you tolerated food without any problems? Yes.    Have you been able to return to your normal activities? Yes.    Do you have any questions about your discharge instructions: Diet   No. Medications  No. Follow up visit  No.  Do you have questions or concerns about your Care? No.  Actions: * If pain score is 4 or above: No action needed, pain <4.

## 2017-05-22 ENCOUNTER — Other Ambulatory Visit: Payer: Self-pay | Admitting: Internal Medicine

## 2017-05-23 ENCOUNTER — Other Ambulatory Visit: Payer: Self-pay | Admitting: Physician Assistant

## 2017-05-23 ENCOUNTER — Other Ambulatory Visit: Payer: Self-pay | Admitting: Adult Health

## 2017-05-23 DIAGNOSIS — M797 Fibromyalgia: Secondary | ICD-10-CM

## 2017-05-26 ENCOUNTER — Other Ambulatory Visit: Payer: Self-pay | Admitting: Physician Assistant

## 2017-05-26 DIAGNOSIS — M797 Fibromyalgia: Secondary | ICD-10-CM

## 2017-05-28 ENCOUNTER — Telehealth: Payer: Self-pay

## 2017-05-28 DIAGNOSIS — M797 Fibromyalgia: Secondary | ICD-10-CM

## 2017-05-28 NOTE — Telephone Encounter (Signed)
Entered in Error

## 2017-05-28 NOTE — Telephone Encounter (Deleted)
Refill request for Tramadol HCL  tab, take two tablets by mouth twice a day. Last office visit on 03/04/17, next office visit on 09/02/17.

## 2017-05-29 ENCOUNTER — Ambulatory Visit: Payer: Self-pay | Admitting: Rheumatology

## 2017-05-30 ENCOUNTER — Other Ambulatory Visit: Payer: Self-pay | Admitting: Physician Assistant

## 2017-05-30 DIAGNOSIS — M797 Fibromyalgia: Secondary | ICD-10-CM

## 2017-05-31 ENCOUNTER — Encounter: Payer: Self-pay | Admitting: Physician Assistant

## 2017-06-03 ENCOUNTER — Ambulatory Visit: Payer: Managed Care, Other (non HMO) | Attending: Pain Medicine | Admitting: Pain Medicine

## 2017-06-03 ENCOUNTER — Other Ambulatory Visit: Payer: Self-pay

## 2017-06-03 ENCOUNTER — Encounter: Payer: Self-pay | Admitting: Pain Medicine

## 2017-06-03 VITALS — BP 141/78 | HR 94 | Temp 98.0°F | Resp 18 | Ht 67.0 in | Wt 205.0 lb

## 2017-06-03 DIAGNOSIS — M1990 Unspecified osteoarthritis, unspecified site: Secondary | ICD-10-CM | POA: Diagnosis not present

## 2017-06-03 DIAGNOSIS — F17210 Nicotine dependence, cigarettes, uncomplicated: Secondary | ICD-10-CM | POA: Insufficient documentation

## 2017-06-03 DIAGNOSIS — M47816 Spondylosis without myelopathy or radiculopathy, lumbar region: Secondary | ICD-10-CM | POA: Insufficient documentation

## 2017-06-03 DIAGNOSIS — D649 Anemia, unspecified: Secondary | ICD-10-CM | POA: Insufficient documentation

## 2017-06-03 DIAGNOSIS — M899 Disorder of bone, unspecified: Secondary | ICD-10-CM | POA: Insufficient documentation

## 2017-06-03 DIAGNOSIS — M797 Fibromyalgia: Secondary | ICD-10-CM | POA: Insufficient documentation

## 2017-06-03 DIAGNOSIS — F329 Major depressive disorder, single episode, unspecified: Secondary | ICD-10-CM | POA: Diagnosis not present

## 2017-06-03 DIAGNOSIS — R7303 Prediabetes: Secondary | ICD-10-CM | POA: Insufficient documentation

## 2017-06-03 DIAGNOSIS — M25512 Pain in left shoulder: Secondary | ICD-10-CM

## 2017-06-03 DIAGNOSIS — K219 Gastro-esophageal reflux disease without esophagitis: Secondary | ICD-10-CM | POA: Diagnosis not present

## 2017-06-03 DIAGNOSIS — G894 Chronic pain syndrome: Secondary | ICD-10-CM | POA: Insufficient documentation

## 2017-06-03 DIAGNOSIS — M25552 Pain in left hip: Secondary | ICD-10-CM | POA: Insufficient documentation

## 2017-06-03 DIAGNOSIS — F419 Anxiety disorder, unspecified: Secondary | ICD-10-CM | POA: Insufficient documentation

## 2017-06-03 DIAGNOSIS — G8929 Other chronic pain: Secondary | ICD-10-CM

## 2017-06-03 DIAGNOSIS — E559 Vitamin D deficiency, unspecified: Secondary | ICD-10-CM | POA: Insufficient documentation

## 2017-06-03 DIAGNOSIS — Z803 Family history of malignant neoplasm of breast: Secondary | ICD-10-CM | POA: Insufficient documentation

## 2017-06-03 DIAGNOSIS — M47812 Spondylosis without myelopathy or radiculopathy, cervical region: Secondary | ICD-10-CM | POA: Diagnosis not present

## 2017-06-03 DIAGNOSIS — R52 Pain, unspecified: Secondary | ICD-10-CM

## 2017-06-03 DIAGNOSIS — Z789 Other specified health status: Secondary | ICD-10-CM

## 2017-06-03 DIAGNOSIS — K589 Irritable bowel syndrome without diarrhea: Secondary | ICD-10-CM | POA: Diagnosis not present

## 2017-06-03 DIAGNOSIS — F411 Generalized anxiety disorder: Secondary | ICD-10-CM | POA: Insufficient documentation

## 2017-06-03 DIAGNOSIS — Z8249 Family history of ischemic heart disease and other diseases of the circulatory system: Secondary | ICD-10-CM | POA: Insufficient documentation

## 2017-06-03 DIAGNOSIS — M25511 Pain in right shoulder: Secondary | ICD-10-CM

## 2017-06-03 DIAGNOSIS — G4700 Insomnia, unspecified: Secondary | ICD-10-CM | POA: Insufficient documentation

## 2017-06-03 DIAGNOSIS — Z9049 Acquired absence of other specified parts of digestive tract: Secondary | ICD-10-CM | POA: Insufficient documentation

## 2017-06-03 DIAGNOSIS — Z79899 Other long term (current) drug therapy: Secondary | ICD-10-CM | POA: Diagnosis not present

## 2017-06-03 DIAGNOSIS — I10 Essential (primary) hypertension: Secondary | ICD-10-CM | POA: Insufficient documentation

## 2017-06-03 DIAGNOSIS — Z79891 Long term (current) use of opiate analgesic: Secondary | ICD-10-CM | POA: Insufficient documentation

## 2017-06-03 DIAGNOSIS — Z823 Family history of stroke: Secondary | ICD-10-CM | POA: Insufficient documentation

## 2017-06-03 DIAGNOSIS — Z9884 Bariatric surgery status: Secondary | ICD-10-CM | POA: Insufficient documentation

## 2017-06-03 DIAGNOSIS — M25551 Pain in right hip: Secondary | ICD-10-CM | POA: Diagnosis not present

## 2017-06-03 DIAGNOSIS — Z87442 Personal history of urinary calculi: Secondary | ICD-10-CM | POA: Insufficient documentation

## 2017-06-03 NOTE — Progress Notes (Signed)
Patient's Name: Wendy Valencia  MRN: [id number]  Referring Provider: Unk Pinto, MD  DOB: Jul 12, 1965  PCP: Unk Pinto, MD  DOS: 06/03/2017  Note by: Gaspar Cola, MD  Service setting: Ambulatory outpatient  Specialty: Interventional Pain Management  Location: [facility] (AMB) Pain Management Facility    Patient type: Established   Primary Reason(s) for Visit: Encounter for evaluation before starting new chronic pain management plan of care (Level of risk: moderate) CC: Shoulder Pain (bilateral); Hip Pain (bilateral); Leg Pain (posterior to calf); and Fibromyalgia  HPI  Wendy Valencia is a 52 y.o. year old, female patient, who comes today for a follow-up evaluation to review the test results and decide on a treatment plan. She has Anxiety state; Essential hypertension; Tobacco abuse; Morbid obesity ([place]); Prediabetes; Insomnia; Medication management; Vitamin D deficiency; S/P gastric bypass; Cervical spondylosis without myelopathy; Fibromyalgia affecting multiple sites (Secondary Area of Pain); Spondylosis of lumbar spine; Chronic generalized pain (Primary Area of Pain); Chronic pain syndrome; Long term current use of opiate analgesic; Long term prescription benzodiazepine use; Pharmacologic therapy; Disorder of skeletal system; Problems influencing health status; Chronic shoulder pain (Tertiary Area of Pain) (Bilateral) (R>L); and Chronic hip pain (Fourth Area of Pain) (Bilateral) (R>L) on their problem list. Her primarily concern today is the Shoulder Pain (bilateral); Hip Pain (bilateral); Leg Pain (posterior to calf); and Fibromyalgia  Pain Assessment: Location: Right, Left Shoulder Radiating: hips,legs- fibromyalgia  Onset: More than a month ago Duration: Chronic pain Quality: Aching, Dull, Sore Severity: 6 /10 (subjective, self-reported pain score)  Note: Reported level is inconsistent with clinical observations. Clinically the patient looks like a 2/10 A 2/10 is viewed as  "Mild to Moderate" and described as noticeable and distracting. Impossible to hide from other people. More frequent flare-ups. Still possible to adapt and function close to normal. It can be very annoying and may have occasional stronger flare-ups. With discipline, patients may get used to it and adapt. Information on the proper use of the pain scale provided to the patient today. When using our objective Pain Scale, levels between 6 and 10/10 are said to belong in an emergency room, as it progressively worsens from a 6/10, described as severely limiting, requiring emergency care not usually available at an outpatient pain management facility. At a 6/10 level, communication becomes difficult and requires great effort. Assistance to reach the emergency department may be required. Facial flushing and profuse sweating along with potentially dangerous increases in heart rate and blood pressure will be evident. Timing: Constant Modifying factors: resting, tramadol, gabapentin, Cymbalta, Lorazepam BP: (!) 141/78  HR: 94  Wendy Valencia comes in today for a follow-up visit after her initial evaluation on 04/15/2017. Today we went over the results of her tests. These were explained in "Layman's terms". During today's appointment we went over my diagnostic impression, as well as the proposed treatment plan.  According to the patient her primary area of pain is generalized. She admits that she has been diagnosed with fibromyalgia (by Rachel Moulds, MD). She admits that she has been developed this pain for approximately 8 years however the last 2 years has gotten worse. She admits that she has been treated by her primary care with tramadol 100 mg twice daily, gabapentin 300 mg three daily and Cymbalta 60 mg daily. She admits that she does have occasional flare-ups every 3-4 days. She admits that the flare-up does affect her job. She does work full time. She is concerned that this will affect her job over time.  She  admits that she is to go see a rheumatologist. She admits that the pain does cause her to be anxious and it affects her sleep. She is currently using lorazepam and Excedrin P.M. She is s/p gastric bypass approximately 6 years ago. She admits the fibromyalgia affects her shoulders and hips.  She admits that the shoulder pain is worse on the right side and radiates around to the left (R>L). She admits that this is not a constant pain but when it does occur is she describes it as a heaviness. She admits try to stay ahead of the pain.she is status post right rotator cuff surgery repair. She feels that this was related to lifting excessive weight when she was in her 38s. She was a weight liter and was able to pick up 260 pounds.she admits that the pain is worse at night and in the morning. She admits that is affected by weather.  She admits that her second area of pain is in her hips. She denies any numbness tingling or weakness in her lower extremities. She denies any previous surgeries, interventional therapy or physical therapy.  In considering the treatment plan options, Wendy Valencia was reminded that I no longer take patients for medication management only. I asked her to let me know if she had no intention of taking advantage of the interventional therapies, so that we could make arrangements to provide this space to someone interested. I also made it clear that undergoing interventional therapies for the purpose of getting pain medications is very inappropriate on the part of a patient, and it will not be tolerated in this practice. This type of behavior would suggest true addiction and therefore it requires referral to an addiction specialist.   Further details on both, my assessment(s), as well as the proposed treatment plan, please see below.  Controlled Substance Pharmacotherapy Assessment REMS (Risk Evaluation and Mitigation Strategy)  Analgesic: tramadol 50 mg 2 tablets twice daily (fill  date 04/01/2017) tramadol 200 mg per day Highest recorded MME/day: 93.75 mg/day MME/day: 20 mg/day Pill Count: None expected due to no prior prescriptions written by our practice. No notes on file Pharmacokinetics: Liberation and absorption (onset of action): WNL Distribution (time to peak effect): WNL Metabolism and excretion (duration of action): WNL         Pharmacodynamics: Desired effects: Analgesia: Wendy Valencia reports >50% benefit. Functional ability: Patient reports that medication allows her to accomplish basic ADLs Clinically meaningful improvement in function (CMIF): Sustained CMIF goals met Perceived effectiveness: Described as relatively effective, allowing for increase in activities of daily living (ADL) Undesirable effects: Side-effects or Adverse reactions: None reported Monitoring: [place] PMP: Online review of the past 51-monthperiod previously conducted. Not applicable at this point since we have not taken over the patient's medication management yet. List of other Serum/Urine Drug Screening Test(s):  Lab Results  Component Value Date   COCAINSCRNUR NONE DETECTED 07/01/2013   THCU NONE DETECTED 07/01/2013   List of all UDS test(s) done:  Lab Results  Component Value Date   SUMMARY FINAL 04/15/2017   Last UDS on record: Summary  Date Value Ref Range Status  04/15/2017 FINAL  Final    Comment:    ==================================================================== TOXASSURE COMP DRUG ANALYSIS,UR ==================================================================== Test                             Result       Flag  Units Drug Present and Declared for Prescription Verification   Lorazepam                      1185         EXPECTED   ng/mg creat    Source of lorazepam is a scheduled prescription medication.   Phentermine                    PRESENT      EXPECTED   Tramadol                       >4274        EXPECTED   ng/mg creat   O-Desmethyltramadol             >4274        EXPECTED   ng/mg creat   N-Desmethyltramadol            >4274        EXPECTED   ng/mg creat    Source of tramadol is a prescription medication.    O-desmethyltramadol and N-desmethyltramadol are expected    metabolites of tramadol.   Gabapentin                     PRESENT      EXPECTED   Duloxetine                     PRESENT      EXPECTED   Acetaminophen                  PRESENT      EXPECTED   Diphenhydramine                PRESENT      EXPECTED Drug Absent but Declared for Prescription Verification   Ephedrine/Pseudoephedrine      Not Detected UNEXPECTED   Salicylate                     Not Detected UNEXPECTED    Aspirin, as indicated in the declared medication list, is not    always detected even when used as directed. ==================================================================== Test                      Result    Flag   Units      Ref Range   Creatinine              117              mg/dL      >=20 ==================================================================== Declared Medications:  The flagging and interpretation on this report are based on the  following declared medications.  Unexpected results may arise from  inaccuracies in the declared medications.  **Note: The testing scope of this panel includes these medications:  Diphenhydramine (Benadryl)  Duloxetine (Cymbalta)  Gabapentin (Neurontin)  Lorazepam  Phentermine  Pseudoephedrine (Zyrtec D)  Tramadol  **Note: The testing scope of this panel does not include small to  moderate amounts of these reported medications:  Acetaminophen  Aspirin  **Note: The testing scope of this panel does not include following  reported medications:  Betamethasone  Caffeine  Cetirizine (Zyrtec D)  Hydrochlorothiazide (Lisinopril-HCTZ)  Iron (Ferrous Sulfate)  Lisinopril (Lisinopril-HCTZ)  Magnesium  Metoclopramide  Multivitamin  Supplement  Vitamin D2  (Drisdol) ==================================================================== For clinical consultation, please call [phone]. ====================================================================  UDS interpretation: No unexpected findings.          Medication Assessment Form: Patient introduced to form today Treatment compliance: Treatment may start today if patient agrees with proposed plan. Evaluation of compliance is not applicable at this point Risk Assessment Profile: Aberrant behavior: See initial evaluations. None observed or detected today Comorbid factors increasing risk of overdose: See initial evaluation. No additional risks detected today Medical Psychology Evaluation: Please see scanned results in medical record. Opioid Risk Tool - 06/03/17 1100      Family History of Substance Abuse   Alcohol  Negative    Illegal Drugs  Negative    Rx Drugs  Negative      Personal History of Substance Abuse   Alcohol  Negative    Illegal Drugs  Negative    Rx Drugs  Negative      Age   Age between 93-45 years   No      History of Preadolescent Sexual Abuse   History of Preadolescent Sexual Abuse  Negative or Female      Psychological Disease   Psychological Disease  Negative    Depression  Negative      Total Score   Opioid Risk Tool Scoring  0    Opioid Risk Interpretation  Low Risk      ORT Scoring interpretation table:  Score <3 = Low Risk for SUD  Score between 4-7 = Moderate Risk for SUD  Score >8 = High Risk for Opioid Abuse   Risk Mitigation Strategies:  Patient opioid safety counseling: Completed today. Counseling provided to patient as per "Patient Counseling Document". Document signed by patient, attesting to counseling and understanding Patient-Prescriber Agreement (PPA): Obtained today.  Controlled substance notification to other providers: Written and sent today.  Pharmacologic Plan: Today we may be taking over the patient's pharmacological regimen.  See below.             Laboratory Chemistry  Inflammation Markers (CRP: Acute Phase) (ESR: Chronic Phase) Lab Results  Component Value Date   CRP 0.9 03/04/2017   ESRSEDRATE 22 (H) 03/04/2017                         Renal Function Markers Lab Results  Component Value Date   BUN 13 03/04/2017   CREATININE 0.68 32/35/5732   BCR NOT APPLICABLE 20/25/4270   GFRAA 118 03/04/2017   GFRNONAA 102 03/04/2017                              Hepatic Function Markers Lab Results  Component Value Date   AST 16 03/04/2017   ALT 14 03/04/2017   ALBUMIN 4.5 03/05/2016   ALKPHOS 123 (H) 03/05/2016                        Electrolytes Lab Results  Component Value Date   NA 140 03/04/2017   K 4.0 03/04/2017   CL 107 03/04/2017   CALCIUM 9.6 03/04/2017   MG 2.2 04/15/2017                        Neuropathy Markers Lab Results  Component Value Date   VITAMINB12 707 03/04/2017   HGBA1C 5.8 (H) 03/04/2017   HIV NON REACTIVE 02/16/2013  Bone Pathology Markers Lab Results  Component Value Date   VD25OH 60 03/04/2017                         Coagulation Parameters Lab Results  Component Value Date   PLT 394 03/04/2017   DDIMER 0.36 09/12/2012                        Cardiovascular Markers Lab Results  Component Value Date   TROPONINI <0.30 09/12/2012   HGB 12.1 03/04/2017   HCT 37.0 03/04/2017                         Note: Lab results reviewed.  Recent Diagnostic Imaging Review   Complexity Note: No results found under the [facility] electronic medical record.                         Meds   Current Outpatient Medications:  .  Aspirin-Acetaminophen-Caffeine (EXCEDRIN EXTRA STRENGTH PO), Take by mouth daily as needed., Disp: , Rfl:  .  augmented betamethasone dipropionate (DIPROLENE-AF) 0.05 % ointment, Apply topically daily., Disp: 50 g, Rfl: 3 .  cetirizine-pseudoephedrine (ZYRTEC-D) 5-120 MG tablet, Take 1 tablet by mouth 2 (two) times daily  as needed for allergies., Disp: , Rfl:  .  Chorionic Gonadotropin (HCG IJ), Inject 2 mg as directed 2 (two) times a week., Disp: , Rfl:  .  diphenhydrAMINE (BENADRYL) 50 MG tablet, Take 50 mg by mouth 2 (two) times daily., Disp: , Rfl:  .  DULoxetine (CYMBALTA) 60 MG capsule, TAKE ONE CAPSULE BY MOUTH DAILY, Disp: 90 capsule, Rfl: 1 .  ferrous sulfate 325 (65 FE) MG EC tablet, Take 67 mg by mouth once. , Disp: , Rfl:  .  gabapentin (NEURONTIN) 300 MG capsule, TAKE ONE CAPSULE BY MOUTH THREE TIMES A DAY, Disp: 90 capsule, Rfl: 0 .  Ginkgo Biloba (GINKOBA PO), Take by mouth once., Disp: , Rfl:  .  Glucosamine-Chondroitin (COSAMIN DS PO), Take 1,500 mg by mouth daily., Disp: , Rfl:  .  Linoleic Acid-Sunflower Oil (CLA) [phone] MG CAPS, Take 500 capsules by mouth 2 (two) times daily., Disp: , Rfl:  .  lisinopril-hydrochlorothiazide (PRINZIDE,ZESTORETIC) 20-12.5 MG tablet, TAKE 1 TABLET BY MOUTH EVERY DAY, Disp: 90 tablet, Rfl: 0 .  LORazepam (ATIVAN) 2 MG tablet, TAKE 1/2 TO 1 TABLET AT BEDTIME AS NEEDED FOR SLEEP. DO NOT EXCEED 5 TABLETS A WEEK, Disp: 30 tablet, Rfl: 0 .  Magnesium 500 MG TABS, Take 500 mg by mouth at bedtime. , Disp: , Rfl:  .  metoCLOPramide (REGLAN) 10 MG tablet, TAKE ONE TABLET BY MOUTH ONCE DAILY, Disp: 90 tablet, Rfl: 0 .  Multiple Vitamins-Minerals (MULTIVITAMIN WITH MINERALS) tablet, Take 1 tablet by mouth daily. Bariatric multi vitamin., Disp: , Rfl:  .  phentermine 37.5 MG capsule, Take 37.5 mg by mouth once., Disp: , Rfl:  .  traMADol (ULTRAM) 50 MG tablet, TAKE TWO TABLETS BY MOUTH TWICE A DAY, Disp: 120 tablet, Rfl: 0 .  Turmeric 500 MG CAPS, Take 1,000 mg by mouth daily., Disp: , Rfl:  .  Vitamin D, Ergocalciferol, (DRISDOL) 50000 units CAPS capsule, TAKE ONE CAPSULE BY MOUTH TWICE WEEKLY, Disp: 30 capsule, Rfl: 0  Current Facility-Administered Medications:  .  0.9 %  sodium chloride infusion, 500 mL, Intravenous, Once, Ladene Artist, MD  ROS  Constitutional:  Denies any  fever or chills Gastrointestinal: No reported hemesis, hematochezia, vomiting, or acute GI distress Musculoskeletal: Denies any acute onset joint swelling, redness, loss of ROM, or weakness Neurological: No reported episodes of acute onset apraxia, aphasia, dysarthria, agnosia, amnesia, paralysis, loss of coordination, or loss of consciousness  Allergies  Wendy Valencia has No Known Allergies.  PFSH  Drug: Wendy Valencia  reports that she does not use drugs. Alcohol:  reports that she drinks about 0.6 oz of alcohol per week. Tobacco:  reports that she has been smoking cigarettes.  She has a 1.75 pack-year smoking history. She has never used smokeless tobacco. Medical:  has a past medical history of Allergy, Anemia, Anxiety, Arthritis, Fibromyalgia, GERD (gastroesophageal reflux disease), Hypertension, IBS (irritable bowel syndrome), Nephrolithiasis, and Plantar fasciitis of right foot. Surgical: Wendy Valencia  has a past surgical history that includes Left shoulder arthroscopic surgery (yrs ago); tummy tuck (2005); Breath tek h pylori (07/11/2011); Gastric Roux-En-Y (10/01/2011); Cholecystectomy (10/01/2011); and Colonoscopy. Family: family history includes Breast cancer (age of onset: 65) in her mother; Breast cancer (age of onset: 54) in her maternal aunt; Cancer in her maternal aunt and maternal grandmother; Cancer (age of onset: 8) in her mother; Heart attack in her maternal grandfather and paternal grandfather; Heart disease in her father and maternal grandfather; Hypertension in her father; Lung cancer in her maternal grandmother; Multiple sclerosis in her maternal uncle; Obesity in her brother; Stroke in her father.  Constitutional Exam  General appearance: Well nourished, well developed, and well hydrated. In no apparent acute distress Vitals:   06/03/17 1054  BP: (!) 141/78  Pulse: 94  Resp: 18  Temp: 98 F (36.7 C)  TempSrc: Oral  SpO2: 95%  Weight: 205 lb (93 kg)   Height: '5\' 7"'  (1.702 m)   BMI Assessment: Estimated body mass index is 32.11 kg/m as calculated from the following:   Height as of this encounter: '5\' 7"'  (1.702 m).   Weight as of this encounter: 205 lb (93 kg).  BMI interpretation table: BMI level Category Range association with higher incidence of chronic pain  <18 kg/m2 Underweight   18.5-24.9 kg/m2 Ideal body weight   25-29.9 kg/m2 Overweight Increased incidence by 20%  30-34.9 kg/m2 Obese (Class I) Increased incidence by 68%  35-39.9 kg/m2 Severe obesity (Class II) Increased incidence by 136%  >40 kg/m2 Extreme obesity (Class III) Increased incidence by 254%   Patient's current BMI Ideal Body weight  Body mass index is 32.11 kg/m. Ideal body weight: 61.6 kg (135 lb 12.9 oz) Adjusted ideal body weight: 74.2 kg (163 lb 7.7 oz)   BMI Readings from Last 4 Encounters:  06/03/17 32.11 kg/m  05/13/17 32.11 kg/m  04/29/17 33.17 kg/m  04/15/17 32.11 kg/m   Wt Readings from Last 4 Encounters:  06/03/17 205 lb (93 kg)  05/13/17 205 lb (93 kg)  04/29/17 211 lb 12.8 oz (96.1 kg)  04/15/17 205 lb (93 kg)  Psych/Mental status: Alert, oriented x 3 (person, place, & time)       Eyes: PERLA Respiratory: No evidence of acute respiratory distress  Cervical Spine Area Exam  Skin & Axial Inspection: No masses, redness, edema, swelling, or associated skin lesions Alignment: Symmetrical Functional ROM: Unrestricted ROM      Stability: No instability detected Muscle Tone/Strength: Functionally intact. No obvious neuro-muscular anomalies detected. Sensory (Neurological): Unimpaired Palpation: No palpable anomalies              Upper Extremity (UE) Exam    Side: Right upper  extremity  Side: Left upper extremity  Skin & Extremity Inspection: Skin color, temperature, and hair growth are WNL. No peripheral edema or cyanosis. No masses, redness, swelling, asymmetry, or associated skin lesions. No contractures.  Skin & Extremity Inspection:  Skin color, temperature, and hair growth are WNL. No peripheral edema or cyanosis. No masses, redness, swelling, asymmetry, or associated skin lesions. No contractures.  Functional ROM: Decreased ROM for shoulder  Functional ROM: Decreased ROM for shoulder  Muscle Tone/Strength: Functionally intact. No obvious neuro-muscular anomalies detected.  Muscle Tone/Strength: Functionally intact. No obvious neuro-muscular anomalies detected.  Sensory (Neurological): Unimpaired          Sensory (Neurological): Unimpaired          Palpation: No palpable anomalies              Palpation: No palpable anomalies              Specialized Test(s): Deferred         Specialized Test(s): Deferred          Thoracic Spine Area Exam  Skin & Axial Inspection: No masses, redness, or swelling Alignment: Symmetrical Functional ROM: Unrestricted ROM Stability: No instability detected Muscle Tone/Strength: Functionally intact. No obvious neuro-muscular anomalies detected. Sensory (Neurological): Unimpaired Muscle strength & Tone: No palpable anomalies  Lumbar Spine Area Exam  Skin & Axial Inspection: No masses, redness, or swelling Alignment: Symmetrical Functional ROM: Unrestricted ROM       Stability: No instability detected Muscle Tone/Strength: Functionally intact. No obvious neuro-muscular anomalies detected. Sensory (Neurological): Unimpaired Palpation: No palpable anomalies       Provocative Tests: Lumbar Hyperextension and rotation test: evaluation deferred today       Lumbar Lateral bending test: evaluation deferred today       Patrick's Maneuver: Positive             for bilateral hip arthralgia  Gait & Posture Assessment  Ambulation: Unassisted Gait: Relatively normal for age and body habitus Posture: WNL   Lower Extremity Exam    Side: Right lower extremity  Side: Left lower extremity  Stability: No instability observed          Stability: No instability observed          Skin & Extremity  Inspection: Skin color, temperature, and hair growth are WNL. No peripheral edema or cyanosis. No masses, redness, swelling, asymmetry, or associated skin lesions. No contractures.  Skin & Extremity Inspection: Skin color, temperature, and hair growth are WNL. No peripheral edema or cyanosis. No masses, redness, swelling, asymmetry, or associated skin lesions. No contractures.  Functional ROM: Unrestricted ROM                  Functional ROM: Unrestricted ROM                  Muscle Tone/Strength: Able to Toe-walk & Heel-walk without problems  Muscle Tone/Strength: Able to Toe-walk & Heel-walk without problems  Sensory (Neurological): Unimpaired  Sensory (Neurological): Unimpaired  Palpation: No palpable anomalies  Palpation: No palpable anomalies   Assessment & Plan  Primary Diagnosis & Pertinent Problem List: The primary encounter diagnosis was Chronic pain syndrome. Diagnoses of Chronic hip pain (Fourth Area of Pain) (Bilateral) (R>L), Chronic generalized pain (Primary Area of Pain), Chronic shoulder pain (Tertiary Area of Pain) (Bilateral) (R>L), Fibromyalgia affecting multiple sites (Secondary Area of Pain), Disorder of skeletal system, Problems influencing health status, and Pharmacologic therapy were also pertinent to this visit.  Visit Diagnosis:  1. Chronic pain syndrome   2. Chronic hip pain (Fourth Area of Pain) (Bilateral) (R>L)   3. Chronic generalized pain (Primary Area of Pain)   4. Chronic shoulder pain (Tertiary Area of Pain) (Bilateral) (R>L)   5. Fibromyalgia affecting multiple sites (Secondary Area of Pain)   6. Disorder of skeletal system   7. Problems influencing health status   8. Pharmacologic therapy    Problems updated and reviewed during this visit: No problems updated. Plan of Care  Pharmacotherapy (Medications Ordered): No orders of the defined types were placed in this encounter.   Procedure Orders     HIP INJECTION Lab Orders  No laboratory test(s)  ordered today    Imaging Orders     DG HIP UNILAT W OR W/O PELVIS 2-3 VIEWS LEFT     DG HIP UNILAT W OR W/O PELVIS 2-3 VIEWS RIGHT     DG Shoulder Right     DG Shoulder Left Referral Orders  No referral(s) requested today    Pharmacological management options:  Opioid Analgesics: I will not be prescribing any opioids at this time Membrane stabilizer: None prescribed at this time Muscle relaxant: None prescribed at this time NSAID: None prescribed at this time Other analgesic(s): None prescribed at this time   Interventional management options: Planned, scheduled, and/or pending:    Diagnostic bilateral hip block    Considering:   Lidocaine infusions Trigger point injections Diagnostic bilateral cervical facet nerve blocks Diagnostic bilateral cervical facet RFA   PRN Procedures:   None at this time   Provider-requested follow-up: Return for Procedure (w/ sedation): (B) Hip BLK #1.  Future Appointments  Date Time Provider [place]  06/25/2017  8:30 AM Milinda Pointer, MD [facility] None  09/02/2017  9:00 AM Vicie Mutters, [place]olm Idol None    Primary Care Physician: Unk Pinto, MD Location: [facility] Note by: Gaspar Cola, MD Date: 06/03/2017; Time: 12:46 PM

## 2017-06-03 NOTE — Patient Instructions (Addendum)
____________________________________________________________________________________________  Pain Scale  Introduction: The pain score used by this practice is the Verbal Numerical Rating Scale (VNRS-11). This is an 11-point scale. It is for adults and children 10 years or older. There are significant differences in how the pain score is reported, used, and applied. Forget everything you learned in the past and learn this scoring system.  General Information: The scale should reflect your current level of pain. Unless you are specifically asked for the level of your worst pain, or your average pain. If you are asked for one of these two, then it should be understood that it is over the past 24 hours.  Basic Activities of Daily Living (ADL): Personal hygiene, dressing, eating, transferring, and using restroom.  Instructions: Most patients tend to report their level of pain as a combination of two factors, their physical pain and their psychosocial pain. This last one is also known as "suffering" and it is reflection of how physical pain affects you socially and psychologically. From now on, report them separately. From this point on, when asked to report your pain level, report only your physical pain. Use the following table for reference.  Pain Clinic Pain Levels (0-5/10)  Pain Level Score  Description  No Pain 0   Mild pain 1 Nagging, annoying, but does not interfere with basic activities of daily living (ADL). Patients are able to eat, bathe, get dressed, toileting (being able to get on and off the toilet and perform personal hygiene functions), transfer (move in and out of bed or a chair without assistance), and maintain continence (able to control bladder and bowel functions). Blood pressure and heart rate are unaffected. A normal heart rate for a healthy adult ranges from 60 to 100 bpm (beats per minute).   Mild to moderate pain 2 Noticeable and distracting. Impossible to hide from other  people. More frequent flare-ups. Still possible to adapt and function close to normal. It can be very annoying and may have occasional stronger flare-ups. With discipline, patients may get used to it and adapt.   Moderate pain 3 Interferes significantly with activities of daily living (ADL). It becomes difficult to feed, bathe, get dressed, get on and off the toilet or to perform personal hygiene functions. Difficult to get in and out of bed or a chair without assistance. Very distracting. With effort, it can be ignored when deeply involved in activities.   Moderately severe pain 4 Impossible to ignore for more than a few minutes. With effort, patients may still be able to manage work or participate in some social activities. Very difficult to concentrate. Signs of autonomic nervous system discharge are evident: dilated pupils (mydriasis); mild sweating (diaphoresis); sleep interference. Heart rate becomes elevated (>115 bpm). Diastolic blood pressure (lower number) rises above 100 mmHg. Patients find relief in laying down and not moving.   Severe pain 5 Intense and extremely unpleasant. Associated with frowning face and frequent crying. Pain overwhelms the senses.  Ability to do any activity or maintain social relationships becomes significantly limited. Conversation becomes difficult. Pacing back and forth is common, as getting into a comfortable position is nearly impossible. Pain wakes you up from deep sleep. Physical signs will be obvious: pupillary dilation; increased sweating; goosebumps; brisk reflexes; cold, clammy hands and feet; nausea, vomiting or dry heaves; loss of appetite; significant sleep disturbance with inability to fall asleep or to remain asleep. When persistent, significant weight loss is observed due to the complete loss of appetite and sleep deprivation.  Blood   pressure and heart rate becomes significantly elevated. Caution: If elevated blood pressure triggers a pounding headache  associated with blurred vision, then the patient should immediately seek attention at an urgent or emergency care unit, as these may be signs of an impending stroke.    Emergency Department Pain Levels (6-10/10)  Emergency Room Pain 6 Severely limiting. Requires emergency care and should not be seen or managed at an outpatient pain management facility. Communication becomes difficult and requires great effort. Assistance to reach the emergency department may be required. Facial flushing and profuse sweating along with potentially dangerous increases in heart rate and blood pressure will be evident.   Distressing pain 7 Self-care is very difficult. Assistance is required to transport, or use restroom. Assistance to reach the emergency department will be required. Tasks requiring coordination, such as bathing and getting dressed become very difficult.   Disabling pain 8 Self-care is no longer possible. At this level, pain is disabling. The individual is unable to do even the most "basic" activities such as walking, eating, bathing, dressing, transferring to a bed, or toileting. Fine motor skills are lost. It is difficult to think clearly.   Incapacitating pain 9 Pain becomes incapacitating. Thought processing is no longer possible. Difficult to remember your own name. Control of movement and coordination are lost.   The worst pain imaginable 10 At this level, most patients pass out from pain. When this level is reached, collapse of the autonomic nervous system occurs, leading to a sudden drop in blood pressure and heart rate. This in turn results in a temporary and dramatic drop in blood flow to the brain, leading to a loss of consciousness. Fainting is one of the body's self defense mechanisms. Passing out puts the brain in a calmed state and causes it to shut down for a while, in order to begin the healing process.    Summary: 1. Refer to this scale when providing us with your pain level. 2. Be  accurate and careful when reporting your pain level. This will help with your care. 3. Over-reporting your pain level will lead to loss of credibility. 4. Even a level of 1/10 means that there is pain and will be treated at our facility. 5. High, inaccurate reporting will be documented as "Symptom Exaggeration", leading to loss of credibility and suspicions of possible secondary gains such as obtaining more narcotics, or wanting to appear disabled, for fraudulent reasons. 6. Only pain levels of 5 or below will be seen at our facility. 7. Pain levels of 6 and above will be sent to the Emergency Department and the appointment cancelled. ____________________________________________________________________________________________   ____________________________________________________________________________________________  Preparing for Procedure with Sedation  Instructions: . Oral Intake: Do not eat or drink anything for at least 8 hours prior to your procedure. . Transportation: Public transportation is not allowed. Bring an adult driver. The driver must be physically present in our waiting room before any procedure can be started. . Physical Assistance: Bring an adult physically capable of assisting you, in the event you need help. This adult should keep you company at home for at least 6 hours after the procedure. . Blood Pressure Medicine: Take your blood pressure medicine with a sip of water the morning of the procedure. . Blood thinners:  . Diabetics on insulin: Notify the staff so that you can be scheduled 1st case in the morning. If your diabetes requires high dose insulin, take only  of your normal insulin dose the morning of the procedure and   notify the staff that you have done so. . Preventing infections: Shower with an antibacterial soap the morning of your procedure. . Build-up your immune system: Take 1000 mg of Vitamin C with every meal (3 times a day) the day prior to your  procedure. Marland Kitchen Antibiotics: Inform the staff if you have a condition or reason that requires you to take antibiotics before dental procedures. . Pregnancy: If you are pregnant, call and cancel the procedure. . Sickness: If you have a cold, fever, or any active infections, call and cancel the procedure. . Arrival: You must be in the facility at least 30 minutes prior to your scheduled procedure. . Children: Do not bring children with you. . Dress appropriately: Bring dark clothing that you would not mind if they get stained. . Valuables: Do not bring any jewelry or valuables.  Procedure appointments are reserved for interventional treatments only. Marland Kitchen No Prescription Refills. . No medication changes will be discussed during procedure appointments. . No disability issues will be discussed.  Remember:  Regular Business hours are:  Monday to Thursday 8:00 AM to 4:00 PM  Provider's Schedule: Milinda Pointer, MD:  Procedure days: Tuesday and Thursday 7:30 AM to 4:00 PM  Gillis Santa, MD:  Procedure days: Monday and Wednesday 7:30 AM to 4:00 PM ____________________________________________________________________________________________   ____________________________________________________________________________________________  Medication Rules  Applies to: All patients receiving prescriptions (written or electronic).  Pharmacy of record: Pharmacy where electronic prescriptions will be sent. If written prescriptions are taken to a different pharmacy, please inform the nursing staff. The pharmacy listed in the electronic medical record should be the one where you would like electronic prescriptions to be sent.  Prescription refills: Only during scheduled appointments. Applies to both, written and electronic prescriptions.  NOTE: The following applies primarily to controlled substances (Opioid* Pain Medications).   Patient's responsibilities: 1. Pain Pills: Bring all pain pills to  every appointment (except for procedure appointments). 2. Pill Bottles: Bring pills in original pharmacy bottle. Always bring newest bottle. Bring bottle, even if empty. 3. Medication refills: You are responsible for knowing and keeping track of what medications you need refilled. The day before your appointment, write a list of all prescriptions that need to be refilled. Bring that list to your appointment and give it to the admitting nurse. Prescriptions will be written only during appointments. If you forget a medication, it will not be "Called in", "Faxed", or "electronically sent". You will need to get another appointment to get these prescribed. 4. Prescription Accuracy: You are responsible for carefully inspecting your prescriptions before leaving our office. Have the discharge nurse carefully go over each prescription with you, before taking them home. Make sure that your name is accurately spelled, that your address is correct. Check the name and dose of your medication to make sure it is accurate. Check the number of pills, and the written instructions to make sure they are clear and accurate. Make sure that you are given enough medication to last until your next medication refill appointment. 5. Taking Medication: Take medication as prescribed. Never take more pills than instructed. Never take medication more frequently than prescribed. Taking less pills or less frequently is permitted and encouraged, when it comes to controlled substances (written prescriptions).  6. Inform other Doctors: Always inform, all of your healthcare providers, of all the medications you take. 7. Pain Medication from other Providers: You are not allowed to accept any additional pain medication from any other Doctor or Healthcare provider. There are two exceptions  to this rule. (see below) In the event that you require additional pain medication, you are responsible for notifying us, as stated below. 8. Medication  Agreement: You are responsible for carefully reading and following our Medication Agreement. This must be signed before receiving any prescriptions from our practice. Safely store a copy of your signed Agreement. Violations to the Agreement will result in no further prescriptions. (Additional copies of our Medication Agreement are available upon request.) 9. Laws, Rules, & Regulations: All patients are expected to follow all Federal and [employer], [employer], Rules, [employer]. Ignorance of the Laws does not constitute a valid excuse. The use of any illegal substances is prohibited. 10. Adopted CDC guidelines & recommendations: Target dosing levels will be at or below 60 MME/day. Use of benzodiazepines** is not recommended.  Exceptions: There are only two exceptions to the rule of not receiving pain medications from other Healthcare Providers. 1. Exception #1 (Emergencies): In the event of an emergency (i.e.: accident requiring emergency care), you are allowed to receive additional pain medication. However, you are responsible for: As soon as you are able, call our office [phone], at any time of the day or night, and leave a message stating your name, the date and nature of the emergency, and the name and dose of the medication prescribed. In the event that your call is answered by a member of our staff, make sure to document and save the date, time, and the name of the person that took your information.  2. Exception #2 (Planned Surgery): In the event that you are scheduled by another doctor or dentist to have any type of surgery or procedure, you are allowed (for a period no longer than 30 days), to receive additional pain medication, for the acute post-op pain. However, in this case, you are responsible for picking up a copy of our "Post-op Pain Management for Surgeons" handout, and giving it to your surgeon or dentist. This document is available at our office, and does not require an appointment  to obtain it. Simply go to our office during business hours (Monday-Thursday from 8:00 AM to 4:00 PM) (Friday 8:00 AM to 12:00 Noon) or if you have a scheduled appointment with [place], prior to your surgery, and ask for it by name. In addition, you will need to provide [place] with your name, name of your surgeon, type of surgery, and date of procedure or surgery.  *Opioid medications include: morphine, codeine, oxycodone, oxymorphone, hydrocodone, hydromorphone, meperidine, tramadol, tapentadol, buprenorphine, fentanyl, methadone. **Benzodiazepine medications include: diazepam (Valium), alprazolam (Xanax), clonazepam (Klonopine), lorazepam (Ativan), clorazepate (Tranxene), chlordiazepoxide (Librium), estazolam (Prosom), oxazepam (Serax), temazepam (Restoril), triazolam (Halcion) (Last updated: 03/14/2017) ____________________________________________________________________________________________   ____________________________________________________________________________________________  Medication Recommendations and Reminders  Applies to: All patients receiving prescriptions (written and/or electronic).  Medication Rules & Regulations: These rules and regulations exist for your safety and that of others. They are not flexible and neither are we. Dismissing or ignoring them will be considered "non-compliance" with medication therapy, resulting in complete and irreversible termination of such therapy. (See document titled "Medication Rules" for more details.) In all conscience, because of safety reasons, we cannot continue providing a therapy where the patient does not follow instructions.  Pharmacy of record:   Definition: This is the pharmacy where your electronic prescriptions will be sent.   We do not endorse any particular pharmacy.  You are not restricted in your choice of pharmacy.  The pharmacy listed in the electronic medical record should be the one where  you want electronic  prescriptions to be sent.  If you choose to change pharmacy, simply notify our nursing staff of your choice of new pharmacy.  Recommendations:  Keep all of your pain medications in a safe place, under lock and key, even if you live alone.   After you fill your prescription, take 1 week's worth of pills and put them away in a safe place. You should keep a separate, properly labeled bottle for this purpose. The remainder should be kept in the original bottle. Use this as your primary supply, until it runs out. Once it's gone, then you know that you have 1 week's worth of medicine, and it is time to come in for a prescription refill. If you do this correctly, it is unlikely that you will ever run out of medicine.  To make sure that the above recommendation works, it is very important that you make sure your medication refill appointments are scheduled at least 1 week before you run out of medicine. To do this in an effective manner, make sure that you do not leave the office without scheduling your next medication management appointment. Always ask the nursing staff to show you in your prescription , when your medication will be running out. Then arrange for the receptionist to get you a return appointment, at least 7 days before you run out of medicine. Do not wait until you have 1 or 2 pills left, to come in. This is very poor planning and does not take into consideration that we may need to cancel appointments due to bad weather, sickness, or emergencies affecting our staff.  Prescription refills and/or changes in medication(s):   Prescription refills, and/or changes in dose or medication, will be conducted only during scheduled medication management appointments. (Applies to both, written and electronic prescriptions.)  No refills on procedure days. No medication will be changed or started on procedure days. No changes, adjustments, and/or refills will be conducted on a procedure day. Doing so will  interfere with the diagnostic portion of the procedure.  No phone refills. No medications will be "called into the pharmacy".  No Fax refills.  No weekend refills.  No Holliday refills.  No after hours refills.  Remember:  Business hours are:  Monday to Thursday 8:00 AM to 4:00 PM Provider's Schedule: Dionisio David, NP - Appointments are:  Medication management: Monday to Thursday 8:00 AM to 4:00 PM Milinda Pointer, MD - Appointments are:  Medication management: Monday and Wednesday 8:00 AM to 4:00 PM Procedure day: Tuesday and Thursday 7:30 AM to 4:00 PM Gillis Santa, MD - Appointments are:  Medication management: Tuesday and Thursday 8:00 AM to 4:00 PM Procedure day: Monday and Wednesday 7:30 AM to 4:00 PM (Last update: 03/14/2017) ____________________________________________________________________________________________   ____________________________________________________________________________________________  CANNABIDIOL (AKA: CBD Oil or Pills)  Applies to: All patients receiving prescriptions of controlled substances (written and/or electronic).  General Information: Cannabidiol (CBD) was discovered in [birth year]. It is one of some 113 identified cannabinoids in cannabis (Marijuana) plants, accounting for up to 40% of the plant's extract. As of 2018, preliminary clinical research on cannabidiol included studies of anxiety, cognition, movement disorders, and pain.  Cannabidiol is consummed in multiple ways, including inhalation of cannabis smoke or vapor, as an aerosol spray into the cheek, and by mouth. It may be supplied as CBD oil containing CBD as the active ingredient (no added tetrahydrocannabinol (THC) or terpenes), a full-plant CBD-dominant hemp extract oil, capsules, dried cannabis, or as a liquid solution. CBD  is thought not have the same psychoactivity as THC, and may affect the actions of THC. Studies suggest that CBD may interact with different biological  targets, including cannabinoid receptors and other neurotransmitter receptors. As of 2018 the mechanism of action for its biological effects has not been determined.  In the [place], cannabidiol has a limited approval by the Food and Drug Administration (FDA) for treatment of only two types of epilepsy disorders. The side effects of long-term use of the drug include somnolence, decreased appetite, diarrhea, fatigue, malaise, weakness, sleeping problems, and others.  CBD remains a Schedule I drug prohibited for any use.  Legality: Some manufacturers ship CBD products nationally, an illegal action which the FDA has not enforced in 2018, with CBD remaining the subject of an FDA investigational new drug evaluation, and is not considered legal as a dietary supplement or food ingredient as of December 2018. Federal illegality has made it difficult historically to conduct research on CBD. CBD is openly sold in head shops and health food stores in some states where such sales have not been explicitly legalized.  Warning: Because it is not FDA approved for general use or treatment of pain, it is not required to undergo the same manufacturing controls as prescription drugs.  This means that the available cannabidiol (CBD) may be contaminated with THC.  If this is the case, it will trigger a positive urine drug screen (UDS) test for cannabinoids (Marijuana).  Because a positive UDS for illicit substances is a violation of our medication agreement, your opioid analgesics (pain medicine) may be permanently discontinued. (Last update: 04/04/2017) ____________________________________________________________________________________________

## 2017-06-06 ENCOUNTER — Ambulatory Visit
Admission: RE | Admit: 2017-06-06 | Discharge: 2017-06-06 | Disposition: A | Discharge status: Home / Self Care | Payer: Managed Care, Other (non HMO) | Source: Ambulatory Visit | Attending: Pain Medicine | Admitting: Pain Medicine

## 2017-06-06 ENCOUNTER — Telehealth: Payer: Self-pay | Admitting: *Deleted

## 2017-06-06 DIAGNOSIS — G8929 Other chronic pain: Secondary | ICD-10-CM | POA: Diagnosis present

## 2017-06-06 DIAGNOSIS — M25512 Pain in left shoulder: Secondary | ICD-10-CM

## 2017-06-06 DIAGNOSIS — M25552 Pain in left hip: Secondary | ICD-10-CM | POA: Insufficient documentation

## 2017-06-06 DIAGNOSIS — M25511 Pain in right shoulder: Secondary | ICD-10-CM

## 2017-06-06 DIAGNOSIS — M25551 Pain in right hip: Secondary | ICD-10-CM | POA: Diagnosis present

## 2017-06-06 NOTE — Progress Notes (Signed)
sw pt today made her aware that she will not be seen if xray's are not completed...td

## 2017-06-16 ENCOUNTER — Other Ambulatory Visit: Payer: Self-pay | Admitting: Physician Assistant

## 2017-06-18 ENCOUNTER — Ambulatory Visit: Payer: Managed Care, Other (non HMO) | Admitting: Pain Medicine

## 2017-06-24 ENCOUNTER — Other Ambulatory Visit: Payer: Self-pay | Admitting: Physician Assistant

## 2017-06-24 DIAGNOSIS — M7061 Trochanteric bursitis, right hip: Secondary | ICD-10-CM | POA: Insufficient documentation

## 2017-06-24 DIAGNOSIS — M797 Fibromyalgia: Secondary | ICD-10-CM

## 2017-06-24 DIAGNOSIS — M7062 Trochanteric bursitis, left hip: Secondary | ICD-10-CM

## 2017-06-24 DIAGNOSIS — M160 Bilateral primary osteoarthritis of hip: Secondary | ICD-10-CM | POA: Insufficient documentation

## 2017-06-24 NOTE — Progress Notes (Signed)
Patient's Name: Wendy Valencia  MRN: [id number]  Referring Provider: Delano Metz, MD  DOB: 1965/10/23  PCP: Lucky Cowboy, MD  DOS: 06/25/2017  Note by: Oswaldo Done, MD  Service setting: Ambulatory outpatient  Specialty: Interventional Pain Management  Patient type: Established  Location: [facility] (AMB) Pain Management Facility  Visit type: Interventional Procedure   Primary Reason for Visit: Interventional Pain Management Treatment. CC: Hip Pain (right)  Procedure #1:  Anesthesia, Analgesia, Anxiolysis:  Type: Intra-Articular Hip Injection #1  Primary Purpose: Diagnostic Region: Posterolateral hip joint area. Level: Lower pelvic and hip joint level. Target Area: Superior aspect of the hip joint cavity, going thru the superior portion of the capsular ligament. Approach: Posterolateral approach. Laterality: Bilateral Position: Lateral Decubitus with bad side up  Type: Moderate (Conscious) Sedation combined with Local Anesthesia Indication(s): Analgesia and Anxiety Route: Intravenous (IV) IV Access: Secured Sedation: Meaningful verbal contact was maintained at all times during the procedure  Local Anesthetic: Lidocaine 1-2%   Procedure #2:    Type: Superficial Trochanteric Bursa Injection #1  Primary Purpose: Diagnostic Region: Upper (proximal) Femoral Region Level: Hip Joint Target Area: Superior aspect of the hip joint cavity, going thru the superior portion of the capsular ligament. Approach: Posterolateral approach Laterality: Bilateral Position: Lateral Decubitus with bad side up     Indications: 1. Osteoarthritis of hips (Bilateral)   2. Greater trochanteric bursitis of hips (Bilateral)   3. Chronic hip pain (Fourth Area of Pain) (Bilateral) (R>L)    Pain Score: Pre-procedure: 5 /10 Post-procedure: 0-No pain/10  Pre-op Assessment:  Wendy Valencia is a 52 y.o. (year old), female patient, seen today for interventional treatment. She  has a past surgical  history that includes Left shoulder arthroscopic surgery (yrs ago); tummy tuck (2005); Breath tek h pylori (07/11/2011); Gastric Roux-En-Y (10/01/2011); Cholecystectomy (10/01/2011); and Colonoscopy. Wendy Valencia has a current medication list which includes the following prescription(s): aspirin-acetaminophen-caffeine, augmented betamethasone dipropionate, cetirizine-pseudoephedrine, chorionic gonadotropin, diphenhydramine, duloxetine, ferrous sulfate, gabapentin, ginkgo biloba, glucosamine-chondroitin, cla, lisinopril-hydrochlorothiazide, lorazepam, magnesium, metoclopramide, multivitamin with minerals, phentermine, turmeric, vitamin d (ergocalciferol), and tramadol, and the following Facility-Administered Medications: sodium chloride, fentanyl, and midazolam. Her primarily concern today is the Hip Pain (right)  Initial Vital Signs:  Pulse/HCG Rate: 72ECG Heart Rate: 71 Temp: 98.8 F (37.1 C) Resp: 16 BP: 131/74 SpO2: 100 %  BMI: Estimated body mass index is 33.67 kg/m as calculated from the following:   Height as of this encounter: 5\' 7"  (1.702 m).   Weight as of this encounter: 215 lb (97.5 kg).  Risk Assessment: Allergies: Reviewed. She has No Known Allergies.  Allergy Precautions: None required Coagulopathies: Reviewed. None identified.  Blood-thinner therapy: None at this time Active Infection(s): Reviewed. None identified. Wendy Valencia is afebrile  Site Confirmation: Wendy Valencia was asked to confirm the procedure and laterality before marking the site Procedure checklist: Completed Consent: Before the procedure and under the influence of no sedative(s), amnesic(s), or anxiolytics, the patient was informed of the treatment options, risks and possible complications. To fulfill our ethical and legal obligations, as recommended by the American Medical Association's Code of Ethics, I have informed the patient of my clinical impression; the nature and purpose of the treatment or  procedure; the risks, benefits, and possible complications of the intervention; the alternatives, including doing nothing; the risk(s) and benefit(s) of the alternative treatment(s) or procedure(s); and the risk(s) and benefit(s) of doing nothing. The patient was provided information about the general risks and possible complications associated with the procedure. These  may include, but are not limited to: failure to achieve desired goals, infection, bleeding, organ or nerve damage, allergic reactions, paralysis, and death. In addition, the patient was informed of those risks and complications associated to the procedure, such as failure to decrease pain; infection; bleeding; organ or nerve damage with subsequent damage to sensory, motor, and/or autonomic systems, resulting in permanent pain, numbness, and/or weakness of one or several areas of the body; allergic reactions; (i.e.: anaphylactic reaction); and/or death. Furthermore, the patient was informed of those risks and complications associated with the medications. These include, but are not limited to: allergic reactions (i.e.: anaphylactic or anaphylactoid reaction(s)); adrenal axis suppression; blood sugar elevation that in diabetics may result in ketoacidosis or comma; water retention that in patients with history of congestive heart failure may result in shortness of breath, pulmonary edema, and decompensation with resultant heart failure; weight gain; swelling or edema; medication-induced neural toxicity; particulate matter embolism and blood vessel occlusion with resultant organ, and/or nervous system infarction; and/or aseptic necrosis of one or more joints. Finally, the patient was informed that Medicine is not an exact science; therefore, there is also the possibility of unforeseen or unpredictable risks and/or possible complications that may result in a catastrophic outcome. The patient indicated having understood very clearly. We have given the  patient no guarantees and we have made no promises. Enough time was given to the patient to ask questions, all of which were answered to the patient's satisfaction. Wendy Valencia has indicated that she wanted to continue with the procedure. Attestation: I, the ordering provider, attest that I have discussed with the patient the benefits, risks, side-effects, alternatives, likelihood of achieving goals, and potential problems during recovery for the procedure that I have provided informed consent. Date  Time: 06/25/2017  8:45 AM  Pre-Procedure Preparation:  Monitoring: As per clinic protocol. Respiration, ETCO2, SpO2, BP, heart rate and rhythm monitor placed and checked for adequate function Safety Precautions: Patient was assessed for positional comfort and pressure points before starting the procedure. Time-out: I initiated and conducted the "Time-out" before starting the procedure, as per protocol. The patient was asked to participate by confirming the accuracy of the "Time Out" information. Verification of the correct person, site, and procedure were performed and confirmed by me, the nursing staff, and the patient. "Time-out" conducted as per Joint Commission's Universal Protocol (UP.01.01.01). Time: 1021  Description of Procedure #1:  Area Prepped: Entire Posterolateral hip area. Prepping solution: ChloraPrep (2% chlorhexidine gluconate and 70% isopropyl alcohol) Safety Precautions: Aspiration looking for blood return was conducted prior to all injections. At no point did we inject any substances, as a needle was being advanced. No attempts were made at seeking any paresthesias. Safe injection practices and needle disposal techniques used. Medications properly checked for expiration dates. SDV (single dose vial) medications used. Description of the Procedure: Protocol guidelines were followed. The patient was placed in position over the fluoroscopy table. The target area was identified and the  area prepped in the usual manner. Skin & deeper tissues infiltrated with local anesthetic. Appropriate amount of time allowed to pass for local anesthetics to take effect. The procedure needles were then advanced to the target area. Proper needle placement secured. Negative aspiration confirmed. Solution injected in intermittent fashion, asking for systemic symptoms every 0.5cc of injectate. The needles were then removed and the area cleansed, making sure to leave some of the prepping solution back to take advantage of its long term bactericidal properties.  Start Time: 1021 hrs. Materials:  Needle(s) Type: Regular needle Gauge: 22G Length: 7-in Medication(s): Please see orders for medications and dosing details.  Imaging Guidance (Non-Spinal):  Type of Imaging Technique: Fluoroscopy Guidance (Non-Spinal) Indication(s): Assistance in needle guidance and placement for procedures requiring needle placement in or near specific anatomical locations not easily accessible without such assistance. Exposure Time: Please see nurses notes. Contrast: Before injecting any contrast, we confirmed that the patient did not have an allergy to iodine, shellfish, or radiological contrast. Once satisfactory needle placement was completed at the desired level, radiological contrast was injected. Contrast injected under live fluoroscopy. No contrast complications. See chart for type and volume of contrast used. Fluoroscopic Guidance: I was personally present during the use of fluoroscopy. "Tunnel Vision Technique" used to obtain the best possible view of the target area. Parallax error corrected before commencing the procedure. "Direction-depth-direction" technique used to introduce the needle under continuous pulsed fluoroscopy. Once target was reached, antero-posterior, oblique, and lateral fluoroscopic projection used confirm needle placement in all planes. Images permanently stored in EMR. Interpretation: I personally  interpreted the imaging intraoperatively. Adequate needle placement confirmed in multiple planes. Appropriate spread of contrast into desired area was observed. No evidence of afferent or efferent intravascular uptake. Permanent images saved into the patient's record.   Description of Procedure #2:  Area Prepped: Entire Posterolateral hip area. Prepping solution: ChloraPrep (2% chlorhexidine gluconate and 70% isopropyl alcohol) Safety Precautions: Aspiration looking for blood return was conducted prior to all injections. At no point did we inject any substances, as a needle was being advanced. No attempts were made at seeking any paresthesias. Safe injection practices and needle disposal techniques used. Medications properly checked for expiration dates. SDV (single dose vial) medications used. Description of the Procedure: Protocol guidelines were followed. The patient was placed in position over the procedure table. The target area was identified and the area prepped in the usual manner. Skin desensitized using vapocoolant spray. Skin & deeper tissues infiltrated with local anesthetic. Appropriate amount of time allowed to pass for local anesthetics to take effect. The procedure needles were then advanced to the target area. Proper needle placement secured. Negative aspiration confirmed. Solution injected in intermittent fashion, asking for systemic symptoms every 0.5cc of injectate. The needles were then removed and the area cleansed, making sure to leave some of the prepping solution back to take advantage of its long term bactericidal properties.  Vitals:   06/25/17 1035 06/25/17 1040 06/25/17 1048 06/25/17 1113  BP: 133/86 119/86 (!) 144/85 (!) 144/94  Pulse:      Resp: 10 12 13 19   Temp:   98.6 F (37 C)   TempSrc:   Temporal   SpO2: 100% 100% 100% 100%  Weight:      Height:         End Time: 1040 hrs.  Materials:  Needle(s) Type: Regular needle Gauge: 22G Length:  7-in Medication(s): Please see orders for medications and dosing details.  Imaging Guidance (Non-Spinal):  Type of Imaging Technique: Fluoroscopy Guidance (Non-Spinal) Indication(s): Assistance in needle guidance and placement for procedures requiring needle placement in or near specific anatomical locations not easily accessible without such assistance. Exposure Time: Please see nurses notes. Contrast: Before injecting any contrast, we confirmed that the patient did not have an allergy to iodine, shellfish, or radiological contrast. Once satisfactory needle placement was completed at the desired level, radiological contrast was injected. Contrast injected under live fluoroscopy. No contrast complications. See chart for type and volume of contrast used. Fluoroscopic Guidance: I was  personally present during the use of fluoroscopy. "Tunnel Vision Technique" used to obtain the best possible view of the target area. Parallax error corrected before commencing the procedure. "Direction-depth-direction" technique used to introduce the needle under continuous pulsed fluoroscopy. Once target was reached, antero-posterior, oblique, and lateral fluoroscopic projection used confirm needle placement in all planes. Images permanently stored in EMR. Interpretation: I personally interpreted the imaging intraoperatively. Adequate needle placement confirmed in multiple planes. Appropriate spread of contrast into desired area was observed. No evidence of afferent or efferent intravascular uptake. Permanent images saved into the patient's record.  Antibiotic Prophylaxis:   Anti-infectives (From admission, onward)   None     Indication(s): None identified  Post-operative Assessment:  Post-procedure Vital Signs:  Pulse/HCG Rate: 7266 Temp: 98.6 F (37 C) Resp: 19 BP: (!) 144/94 SpO2: 100 %  EBL: None  Complications: No immediate post-treatment complications observed by team, or reported by patient.  Note:  The patient tolerated the entire procedure well. A repeat set of vitals were taken after the procedure and the patient was kept under observation following institutional policy, for this type of procedure. Post-procedural neurological assessment was performed, showing return to baseline, prior to discharge. The patient was provided with post-procedure discharge instructions, including a section on how to identify potential problems. Should any problems arise concerning this procedure, the patient was given instructions to immediately contact us, at any time, without hesitation. In any case, we plan to contact the patient by telephone for a follow-up status report regarding this interventional procedure.  Comments:  No additional relevant information.  Plan of Care   Possible POC:  Diagnostic bilateral intra-articular hip joint injection  Diagnostic bilateral trochanteric bursa injection  Diagnostic bilateral femoral nerve + obturator nerve block  Possible bilateral femoral nerve + obturator nerve RFA  Diagnostic bilateral intra-articular shoulder joint injection  Diagnostic bilateral suprascapular nerve block  Possible bilateral suprascapular nerve RFA  Diagnostic Lidocaine infusion  Diagnostic/therapeutic Trigger point injection(s)  Diagnostic bilateral cervical facet nerve block(s)  Possible bilateral cervical facet RFA     Imaging Orders     DG C-Arm 1-60 Min-No Report  Procedure Orders     HIP INJECTION  Medications ordered for procedure: Meds ordered this encounter  Medications  . lidocaine (XYLOCAINE) 2 % (with pres) injection 400 mg  . midazolam (VERSED) 5 MG/5ML injection 1-2 mg    Make sure Flumazenil is available in the pyxis when using this medication. If oversedation occurs, administer 0.2 mg IV over 15 sec. If after 45 sec no response, administer 0.2 mg again over 1 min; may repeat at 1 min intervals; not to exceed 4 doses (1 mg)  . fentaNYL (SUBLIMAZE) injection 25-50  mcg    Make sure Narcan is available in the pyxis when using this medication. In the event of respiratory depression (RR< 8/min): Titrate NARCAN (naloxone) in increments of 0.1 to 0.2 mg IV at 2-3 minute intervals, until desired degree of reversal.  . iopamidol (ISOVUE-M) 41 % intrathecal injection 10 mL    Must be Myelogram-compatible. If not available, you may substitute with a water-soluble, non-ionic, hypoallergenic, myelogram-compatible radiological contrast medium.  . ropivacaine (PF) 2 mg/mL (0.2%) (NAROPIN) injection 9 mL  . methylPREDNISolone acetate (DEPO-MEDROL) injection 80 mg  . traMADol (ULTRAM) 50 MG tablet    Sig: Take 2 tablets (100 mg total) by mouth 2 (two) times daily.    Dispense:  120 tablet    Refill:  2    Do not place this  medication, or any other prescription from our practice, on "Automatic Refill". Patient may have prescription filled one day early if pharmacy is closed on scheduled refill date. Do not fill until: 06/25/17 To last until: 09/23/17   Medications administered: We administered lidocaine, midazolam, fentaNYL, iopamidol, ropivacaine (PF) 2 mg/mL (0.2%), and methylPREDNISolone acetate.  See the medical record for exact dosing, route, and time of administration.  New Prescriptions   TRAMADOL (ULTRAM) 50 MG TABLET    Take 2 tablets (100 mg total) by mouth 2 (two) times daily.   Disposition: Discharge home  Discharge Date & Time: 06/25/2017; 1116 hrs.   Physician-requested Follow-up: Return for post-procedure eval (2 wks), w/ Dr. Laban Emperor.  Future Appointments  Date Time Provider Department Center  07/08/2017  1:30 PM Delano Metz, MD [facility] None  09/02/2017  9:00 AM Quentin Mulling, [facility] None   Primary Care Physician: Lucky Cowboy, MD Location: [facility] Note by: Oswaldo Done, MD Date: 06/25/2017; Time: 11:27 AM  Disclaimer:  Medicine is not an exact science. The only guarantee in  medicine is that nothing is guaranteed. It is important to note that the decision to proceed with this intervention was based on the information collected from the patient. The Data and conclusions were drawn from the patient's questionnaire, the interview, and the physical examination. Because the information was provided in large part by the patient, it cannot be guaranteed that it has not been purposely or unconsciously manipulated. Every effort has been made to obtain as much relevant data as possible for this evaluation. It is important to note that the conclusions that lead to this procedure are derived in large part from the available data. Always take into account that the treatment will also be dependent on availability of resources and existing treatment guidelines, considered by other Pain Management Practitioners as being common knowledge and practice, at the time of the intervention. For Medico-Legal purposes, it is also important to point out that variation in procedural techniques and pharmacological choices are the acceptable norm. The indications, contraindications, technique, and results of the above procedure should only be interpreted and judged by a Board-Certified Interventional Pain Specialist with extensive familiarity and expertise in the same exact procedure and technique.

## 2017-06-25 ENCOUNTER — Ambulatory Visit
Admission: RE | Admit: 2017-06-25 | Discharge: 2017-06-25 | Disposition: A | Discharge status: Home / Self Care | Payer: Managed Care, Other (non HMO) | Source: Ambulatory Visit | Attending: Pain Medicine | Admitting: Pain Medicine

## 2017-06-25 ENCOUNTER — Ambulatory Visit (HOSPITAL_BASED_OUTPATIENT_CLINIC_OR_DEPARTMENT_OTHER): Payer: Managed Care, Other (non HMO) | Admitting: Pain Medicine

## 2017-06-25 ENCOUNTER — Other Ambulatory Visit: Payer: Self-pay

## 2017-06-25 ENCOUNTER — Encounter: Payer: Self-pay | Admitting: Pain Medicine

## 2017-06-25 VITALS — BP 144/94 | HR 72 | Temp 98.6°F | Resp 19 | Ht 67.0 in | Wt 215.0 lb

## 2017-06-25 DIAGNOSIS — Z9884 Bariatric surgery status: Secondary | ICD-10-CM | POA: Insufficient documentation

## 2017-06-25 DIAGNOSIS — M7061 Trochanteric bursitis, right hip: Secondary | ICD-10-CM | POA: Insufficient documentation

## 2017-06-25 DIAGNOSIS — M7062 Trochanteric bursitis, left hip: Secondary | ICD-10-CM | POA: Insufficient documentation

## 2017-06-25 DIAGNOSIS — G8929 Other chronic pain: Secondary | ICD-10-CM | POA: Diagnosis not present

## 2017-06-25 DIAGNOSIS — M25551 Pain in right hip: Secondary | ICD-10-CM

## 2017-06-25 DIAGNOSIS — Z9049 Acquired absence of other specified parts of digestive tract: Secondary | ICD-10-CM | POA: Diagnosis not present

## 2017-06-25 DIAGNOSIS — M160 Bilateral primary osteoarthritis of hip: Secondary | ICD-10-CM | POA: Diagnosis not present

## 2017-06-25 DIAGNOSIS — G894 Chronic pain syndrome: Secondary | ICD-10-CM

## 2017-06-25 DIAGNOSIS — Z9889 Other specified postprocedural states: Secondary | ICD-10-CM | POA: Diagnosis not present

## 2017-06-25 DIAGNOSIS — M25552 Pain in left hip: Secondary | ICD-10-CM | POA: Diagnosis not present

## 2017-06-25 MED ORDER — ROPIVACAINE HCL 2 MG/ML IJ SOLN
9.0000 mL | Freq: Once | INTRAMUSCULAR | Status: AC
  Administered 2017-06-25: 9 mL via INTRA_ARTICULAR
  Filled 2017-06-25: qty 10

## 2017-06-25 MED ORDER — IOPAMIDOL (ISOVUE-M 200) INJECTION 41%
10.0000 mL | Freq: Once | INTRAMUSCULAR | Status: AC
  Administered 2017-06-25: 10 mL via EPIDURAL
  Filled 2017-06-25: qty 10

## 2017-06-25 MED ORDER — FENTANYL CITRATE (PF) 100 MCG/2ML IJ SOLN
25.0000 ug | INTRAMUSCULAR | Status: DC | PRN
  Administered 2017-06-25: 100 ug via INTRAVENOUS
  Filled 2017-06-25: qty 2

## 2017-06-25 MED ORDER — ROPIVACAINE HCL 2 MG/ML IJ SOLN
INTRAMUSCULAR | Status: AC
  Filled 2017-06-25: qty 10

## 2017-06-25 MED ORDER — LIDOCAINE HCL 2 % IJ SOLN
20.0000 mL | Freq: Once | INTRAMUSCULAR | Status: AC
  Administered 2017-06-25: 400 mg
  Filled 2017-06-25: qty 40

## 2017-06-25 MED ORDER — TRAMADOL HCL 50 MG PO TABS: 100.0000 mg | ORAL_TABLET | Freq: Two times a day (BID) | ORAL | 2 refills | Status: DC

## 2017-06-25 MED ORDER — TRIAMCINOLONE ACETONIDE 40 MG/ML IJ SUSP
INTRAMUSCULAR | Status: AC
  Filled 2017-06-25: qty 1

## 2017-06-25 MED ORDER — METHYLPREDNISOLONE ACETATE 80 MG/ML IJ SUSP
80.0000 mg | Freq: Once | INTRAMUSCULAR | Status: AC
  Administered 2017-06-25: 80 mg via INTRA_ARTICULAR
  Filled 2017-06-25: qty 1

## 2017-06-25 MED ORDER — MIDAZOLAM HCL 5 MG/5ML IJ SOLN
1.0000 mg | INTRAMUSCULAR | Status: DC | PRN
  Administered 2017-06-25: 5 mg via INTRAVENOUS
  Filled 2017-06-25: qty 5

## 2017-06-25 NOTE — Progress Notes (Signed)
Safety precautions to be maintained throughout the outpatient stay will include: orient to surroundings, keep bed in low position, maintain call bell within reach at all times, provide assistance with transfer out of bed and ambulation.  

## 2017-06-25 NOTE — Patient Instructions (Addendum)
____________________________________________________________________________________________  Post-Procedure Discharge Instructions  Instructions:  Apply ice: Fill a plastic sandwich bag with crushed ice. Cover it with a small towel and apply to injection site. Apply for 15 minutes then remove x 15 minutes. Repeat sequence on day of procedure, until you go to bed. The purpose is to minimize swelling and discomfort after procedure.  Apply heat: Apply heat to procedure site starting the day following the procedure. The purpose is to treat any soreness and discomfort from the procedure.  Food intake: Start with clear liquids (like water) and advance to regular food, as tolerated.   Physical activities: Keep activities to a minimum for the first 8 hours after the procedure.   Driving: If you have received any sedation, you are not allowed to drive for 24 hours after your procedure.  Blood thinner: Restart your blood thinner 6 hours after your procedure. (Only for those taking blood thinners)  Insulin: As soon as you can eat, you may resume your normal dosing schedule. (Only for those taking insulin)  Infection prevention: Keep procedure site clean and dry.  Post-procedure Pain Diary: Extremely important that this be done correctly and accurately. Recorded information will be used to determine the next step in treatment.  Pain evaluated is that of treated area only. Do not include pain from an untreated area.  Complete every hour, on the hour, for the initial 8 hours. Set an alarm to help you do this part accurately.  Do not go to sleep and have it completed later. It will not be accurate.  Follow-up appointment: Keep your follow-up appointment after the procedure. Usually 2 weeks for most procedures. (6 weeks in the case of radiofrequency.) Bring you pain diary.   Expect:  From numbing medicine (AKA: Local Anesthetics): Numbness or decrease in pain.  Onset: Full effect within 15  minutes of injected.  Duration: It will depend on the type of local anesthetic used. On the average, 1 to 8 hours.   From steroids: Decrease in swelling or inflammation. Once inflammation is improved, relief of the pain will follow.  Onset of benefits: Depends on the amount of swelling present. The more swelling, the longer it will take for the benefits to be seen. In some cases, up to 10 days.  Duration: Steroids will stay in the system x 2 weeks. Duration of benefits will depend on multiple posibilities including persistent irritating factors.  From procedure: Some discomfort is to be expected once the numbing medicine wears off. This should be minimal if ice and heat are applied as instructed.  Call if:  You experience numbness and weakness that gets worse with time, as opposed to wearing off.  New onset bowel or bladder incontinence. (This applies to Spinal procedures only)  Emergency Numbers:  Durning business hours (Monday - Thursday, 8:00 AM - 4:00 PM) (Friday, 9:00 AM - 12:00 Noon): [phone]  After hours: [phone] ____________________________________________________________________________________________   ____________________________________________________________________________________________  Medication Rules  Applies to: All patients receiving prescriptions (written or electronic).  Pharmacy of record: Pharmacy where electronic prescriptions will be sent. If written prescriptions are taken to a different pharmacy, please inform the nursing staff. The pharmacy listed in the electronic medical record should be the one where you would like electronic prescriptions to be sent.  Prescription refills: Only during scheduled appointments. Applies to both, written and electronic prescriptions.  NOTE: The following applies primarily to controlled substances (Opioid* Pain Medications).   Patient's responsibilities: 1. Pain Pills: Bring all pain pills to every    appointment (except for procedure appointments). 2. Pill Bottles: Bring pills in original pharmacy bottle. Always bring newest bottle. Bring bottle, even if empty. 3. Medication refills: You are responsible for knowing and keeping track of what medications you need refilled. The day before your appointment, write a list of all prescriptions that need to be refilled. Bring that list to your appointment and give it to the admitting nurse. Prescriptions will be written only during appointments. If you forget a medication, it will not be "Called in", "Faxed", or "electronically sent". You will need to get another appointment to get these prescribed. 4. Prescription Accuracy: You are responsible for carefully inspecting your prescriptions before leaving our office. Have the discharge nurse carefully go over each prescription with you, before taking them home. Make sure that your name is accurately spelled, that your address is correct. Check the name and dose of your medication to make sure it is accurate. Check the number of pills, and the written instructions to make sure they are clear and accurate. Make sure that you are given enough medication to last until your next medication refill appointment. 5. Taking Medication: Take medication as prescribed. Never take more pills than instructed. Never take medication more frequently than prescribed. Taking less pills or less frequently is permitted and encouraged, when it comes to controlled substances (written prescriptions).  6. Inform other Doctors: Always inform, all of your healthcare providers, of all the medications you take. 7. Pain Medication from other Providers: You are not allowed to accept any additional pain medication from any other Doctor or Healthcare provider. There are two exceptions to this rule. (see below) In the event that you require additional pain medication, you are responsible for notifying us, as stated below. 8. Medication Agreement: You  are responsible for carefully reading and following our Medication Agreement. This must be signed before receiving any prescriptions from our practice. Safely store a copy of your signed Agreement. Violations to the Agreement will result in no further prescriptions. (Additional copies of our Medication Agreement are available upon request.) 9. Laws, Rules, & Regulations: All patients are expected to follow all Federal and State Laws, Statutes, Rules, & Regulations. Ignorance of the Laws does not constitute a valid excuse. The use of any illegal substances is prohibited. 10. Adopted CDC guidelines & recommendations: Target dosing levels will be at or below 60 MME/day. Use of benzodiazepines** is not recommended.  Exceptions: There are only two exceptions to the rule of not receiving pain medications from other Healthcare Providers. 1. Exception #1 (Emergencies): In the event of an emergency (i.e.: accident requiring emergency care), you are allowed to receive additional pain medication. However, you are responsible for: As soon as you are able, call our office [phone], at any time of the day or night, and leave a message stating your name, the date and nature of the emergency, and the name and dose of the medication prescribed. In the event that your call is answered by a member of our staff, make sure to document and save the date, time, and the name of the person that took your information.  2. Exception #2 (Planned Surgery): In the event that you are scheduled by another doctor or dentist to have any type of surgery or procedure, you are allowed (for a period no longer than 30 days), to receive additional pain medication, for the acute post-op pain. However, in this case, you are responsible for picking up a copy of our "Post-op Pain Management for Surgeons"   handout, and giving it to your surgeon or dentist. This document is available at our office, and does not require an appointment to obtain it.  Simply go to our office during business hours (Monday-Thursday from 8:00 AM to 4:00 PM) (Friday 8:00 AM to 12:00 Noon) or if you have a scheduled appointment with us, prior to your surgery, and ask for it by name. In addition, you will need to provide us with your name, name of your surgeon, type of surgery, and date of procedure or surgery.  *Opioid medications include: morphine, codeine, oxycodone, oxymorphone, hydrocodone, hydromorphone, meperidine, tramadol, tapentadol, buprenorphine, fentanyl, methadone. **Benzodiazepine medications include: diazepam (Valium), alprazolam (Xanax), clonazepam (Klonopine), lorazepam (Ativan), clorazepate (Tranxene), chlordiazepoxide (Librium), estazolam (Prosom), oxazepam (Serax), temazepam (Restoril), triazolam (Halcion) (Last updated: 03/14/2017) ____________________________________________________________________________________________   ____________________________________________________________________________________________  Medication Recommendations and Reminders  Applies to: All patients receiving prescriptions (written and/or electronic).  Medication Rules & Regulations: These rules and regulations exist for your safety and that of others. They are not flexible and neither are we. Dismissing or ignoring them will be considered "non-compliance" with medication therapy, resulting in complete and irreversible termination of such therapy. (See document titled "Medication Rules" for more details.) In all conscience, because of safety reasons, we cannot continue providing a therapy where the patient does not follow instructions.  Pharmacy of record:   Definition: This is the pharmacy where your electronic prescriptions will be sent.   We do not endorse any particular pharmacy.  You are not restricted in your choice of pharmacy.  The pharmacy listed in the electronic medical record should be the one where you want electronic prescriptions to be  sent.  If you choose to change pharmacy, simply notify our nursing staff of your choice of new pharmacy.  Recommendations:  Keep all of your pain medications in a safe place, under lock and key, even if you live alone.   After you fill your prescription, take 1 week's worth of pills and put them away in a safe place. You should keep a separate, properly labeled bottle for this purpose. The remainder should be kept in the original bottle. Use this as your primary supply, until it runs out. Once it's gone, then you know that you have 1 week's worth of medicine, and it is time to come in for a prescription refill. If you do this correctly, it is unlikely that you will ever run out of medicine.  To make sure that the above recommendation works, it is very important that you make sure your medication refill appointments are scheduled at least 1 week before you run out of medicine. To do this in an effective manner, make sure that you do not leave the office without scheduling your next medication management appointment. Always ask the nursing staff to show you in your prescription , when your medication will be running out. Then arrange for the receptionist to get you a return appointment, at least 7 days before you run out of medicine. Do not wait until you have 1 or 2 pills left, to come in. This is very poor planning and does not take into consideration that we may need to cancel appointments due to bad weather, sickness, or emergencies affecting our staff.  Prescription refills and/or changes in medication(s):   Prescription refills, and/or changes in dose or medication, will be conducted only during scheduled medication management appointments. (Applies to both, written and electronic prescriptions.)  No refills on procedure days. No medication will be changed or started on   procedure days. No changes, adjustments, and/or refills will be conducted on a procedure day. Doing so will interfere with the  diagnostic portion of the procedure.  No phone refills. No medications will be "called into the pharmacy".  No Fax refills.  No weekend refills.  No Holliday refills.  No after hours refills.  Remember:  Business hours are:  Monday to Thursday 8:00 AM to 4:00 PM Provider's Schedule: Crystal King, NP - Appointments are:  Medication management: Monday to Thursday 8:00 AM to 4:00 PM Francisco Naveira, MD - Appointments are:  Medication management: Monday and Wednesday 8:00 AM to 4:00 PM Procedure day: Tuesday and Thursday 7:30 AM to 4:00 PM Bilal Lateef, MD - Appointments are:  Medication management: Tuesday and Thursday 8:00 AM to 4:00 PM Procedure day: Monday and Wednesday 7:30 AM to 4:00 PM (Last update: 03/14/2017) ____________________________________________________________________________________________   ____________________________________________________________________________________________  CANNABIDIOL (AKA: CBD Oil or Pills)  Applies to: All patients receiving prescriptions of controlled substances (written and/or electronic).  General Information: Cannabidiol (CBD) was discovered in 1940. It is one of some 113 identified cannabinoids in cannabis (Marijuana) plants, accounting for up to 40% of the plant's extract. As of 2018, preliminary clinical research on cannabidiol included studies of anxiety, cognition, movement disorders, and pain.  Cannabidiol is consummed in multiple ways, including inhalation of cannabis smoke or vapor, as an aerosol spray into the cheek, and by mouth. It may be supplied as CBD oil containing CBD as the active ingredient (no added tetrahydrocannabinol (THC) or terpenes), a full-plant CBD-dominant hemp extract oil, capsules, dried cannabis, or as a liquid solution. CBD is thought not have the same psychoactivity as THC, and may affect the actions of THC. Studies suggest that CBD may interact with different biological targets, including  cannabinoid receptors and other neurotransmitter receptors. As of 2018 the mechanism of action for its biological effects has not been determined.  In the [place], cannabidiol has a limited approval by the Food and Drug Administration (FDA) for treatment of only two types of epilepsy disorders. The side effects of long-term use of the drug include somnolence, decreased appetite, diarrhea, fatigue, malaise, weakness, sleeping problems, and others.  CBD remains a Schedule I drug prohibited for any use.  Legality: Some manufacturers ship CBD products nationally, an illegal action which the FDA has not enforced in 2018, with CBD remaining the subject of an FDA investigational new drug evaluation, and is not considered legal as a dietary supplement or food ingredient as of December 2018. Federal illegality has made it difficult historically to conduct research on CBD. CBD is openly sold in head shops and health food stores in some states where such sales have not been explicitly legalized.  Warning: Because it is not FDA approved for general use or treatment of pain, it is not required to undergo the same manufacturing controls as prescription drugs.  This means that the available cannabidiol (CBD) may be contaminated with THC.  If this is the case, it will trigger a positive urine drug screen (UDS) test for cannabinoids (Marijuana).  Because a positive UDS for illicit substances is a violation of our medication agreement, your opioid analgesics (pain medicine) may be permanently discontinued. (Last update: 04/04/2017) ____________________________________________________________________________________________    

## 2017-06-26 ENCOUNTER — Other Ambulatory Visit: Payer: Self-pay | Admitting: Internal Medicine

## 2017-06-26 ENCOUNTER — Telehealth: Payer: Self-pay

## 2017-06-26 MED ORDER — TRAZODONE HCL 150 MG PO TABS: ORAL_TABLET | ORAL | 0 refills | Status: DC

## 2017-06-26 NOTE — Telephone Encounter (Signed)
Post procedure phone call.  Left message.  

## 2017-07-04 ENCOUNTER — Emergency Department (HOSPITAL_COMMUNITY): Payer: Managed Care, Other (non HMO)

## 2017-07-04 ENCOUNTER — Emergency Department (HOSPITAL_COMMUNITY)
Admission: EM | Admit: 2017-07-04 | Discharge: 2017-07-04 | Disposition: A | Discharge status: Home / Self Care | Payer: Managed Care, Other (non HMO) | Attending: Emergency Medicine | Admitting: Emergency Medicine

## 2017-07-04 ENCOUNTER — Encounter (HOSPITAL_COMMUNITY): Payer: Self-pay | Admitting: Emergency Medicine

## 2017-07-04 DIAGNOSIS — Z79899 Other long term (current) drug therapy: Secondary | ICD-10-CM | POA: Diagnosis not present

## 2017-07-04 DIAGNOSIS — J209 Acute bronchitis, unspecified: Secondary | ICD-10-CM | POA: Diagnosis not present

## 2017-07-04 DIAGNOSIS — I10 Essential (primary) hypertension: Secondary | ICD-10-CM | POA: Diagnosis not present

## 2017-07-04 DIAGNOSIS — F17210 Nicotine dependence, cigarettes, uncomplicated: Secondary | ICD-10-CM | POA: Diagnosis not present

## 2017-07-04 DIAGNOSIS — J069 Acute upper respiratory infection, unspecified: Secondary | ICD-10-CM | POA: Insufficient documentation

## 2017-07-04 DIAGNOSIS — R0789 Other chest pain: Secondary | ICD-10-CM

## 2017-07-04 LAB — I-STAT TROPONIN, ED: Troponin i, poc: 0 ng/mL (ref 0.00–0.08)

## 2017-07-04 LAB — CBC
HEMATOCRIT: 41.7 % (ref 36.0–46.0)
HEMOGLOBIN: 13.6 g/dL (ref 12.0–15.0)
MCH: 28 pg (ref 26.0–34.0)
MCHC: 32.6 g/dL (ref 30.0–36.0)
MCV: 86 fL (ref 78.0–100.0)
Platelets: 393 10*3/uL (ref 150–400)
RBC: 4.85 MIL/uL (ref 3.87–5.11)
RDW: 16 % — AB (ref 11.5–15.5)
WBC: 10.2 10*3/uL (ref 4.0–10.5)

## 2017-07-04 LAB — BASIC METABOLIC PANEL
Anion gap: 9 (ref 5–15)
BUN: 13 mg/dL (ref 6–20)
CHLORIDE: 106 mmol/L (ref 101–111)
CO2: 26 mmol/L (ref 22–32)
Calcium: 9.2 mg/dL (ref 8.9–10.3)
Creatinine, Ser: 0.65 mg/dL (ref 0.44–1.00)
GFR calc Af Amer: >60 mL/min (ref >60)
GFR calc non Af Amer: >60 mL/min (ref >60)
Glucose, Bld: 107 mg/dL — ABNORMAL HIGH (ref 65–99)
POTASSIUM: 3.9 mmol/L (ref 3.5–5.1)
SODIUM: 141 mmol/L (ref 135–145)

## 2017-07-04 LAB — I-STAT BETA HCG BLOOD, ED (MC, WL, AP ONLY)

## 2017-07-04 MED ORDER — ALBUTEROL SULFATE HFA 108 (90 BASE) MCG/ACT IN AERS
2.0000 | INHALATION_SPRAY | Freq: Once | RESPIRATORY_TRACT | Status: AC
  Administered 2017-07-04: 2 via RESPIRATORY_TRACT
  Filled 2017-07-04: qty 6.7

## 2017-07-04 MED ORDER — BENZONATATE 100 MG PO CAPS: 100.0000 mg | ORAL_CAPSULE | Freq: Three times a day (TID) | ORAL | 0 refills | Status: DC

## 2017-07-04 MED ORDER — AEROCHAMBER PLUS W/MASK MISC: 2 refills | Status: DC

## 2017-07-04 MED ORDER — NAPROXEN 500 MG PO TABS: 500.0000 mg | ORAL_TABLET | Freq: Two times a day (BID) | ORAL | 0 refills | Status: DC

## 2017-07-04 MED ORDER — HYDROCODONE-HOMATROPINE 5-1.5 MG/5ML PO SYRP: 5.0000 mL | ORAL_SOLUTION | Freq: Four times a day (QID) | ORAL | 0 refills | Status: DC | PRN

## 2017-07-04 NOTE — ED Provider Notes (Signed)
[facility]-EMERGENCY DEPT Provider Note   CSN: [id number] Arrival date & time: 07/04/17  1038     History   Chief Complaint Chief Complaint  Patient presents with  . Chest Pain  . Cough    HPI STEPHENIE Valencia is a 52 y.o. female.  HPI Patient reports that [facility] and congestion symptoms and was referred to the emergency department for report of left anterior chest pain.  Patient reports that she has had a cough and chest congestion that actually started about 4 weeks ago.  Cough is been persistent and worse at night.  She reports she gets episodes of dry recurrent cough that she has a hard time stopping.  She reports then it makes her have a headache.  She reports she is also had nasal congestion and drainage.  She reports subjective fever with hot and cold episodes.  No measured fever.  No lower extremity swelling or calf pain.  Patient is an occasional smoker only a few times a week.  Patient reports that the left anterior chest pain started this morning.  She was out walking her dog and had to bend over to pick it up.  She reports she suddenly got up pain in the left anterior chest. Past Medical History:  Diagnosis Date  . Allergy   . Anemia   . Anxiety   . Arthritis   . Fibromyalgia   . GERD (gastroesophageal reflux disease)   . Hypertension   . IBS (irritable bowel syndrome)   . Nephrolithiasis   . Plantar fasciitis of right foot    wears boot     Patient Active Problem List   Diagnosis Date Noted  . Greater trochanteric bursitis of hips (Bilateral) 06/24/2017  . Osteoarthritis of hips (Bilateral) 06/24/2017  . Chronic generalized pain (Primary Area of Pain) 04/15/2017  . Chronic pain syndrome 04/15/2017  . Long term current use of opiate analgesic 04/15/2017  . Long term prescription benzodiazepine use 04/15/2017  . Pharmacologic therapy 04/15/2017  . Disorder of skeletal system 04/15/2017  . Problems  influencing health status 04/15/2017  . Chronic shoulder pain [facility] Area of Pain) (Bilateral) (R>L) 04/15/2017  . Chronic hip pain (Fourth Area of Pain) (Bilateral) (R>L) 04/15/2017  . Cervical spondylosis without myelopathy 02/11/2017  . Fibromyalgia affecting multiple sites (Secondary Area of Pain) 02/11/2017  . Spondylosis of lumbar spine 02/11/2017  . Medication management 08/30/2014  . Vitamin D deficiency 08/30/2014  . S/P gastric bypass 08/30/2014  . Prediabetes 01/01/2014  . Insomnia 01/01/2014  . Morbid obesity (HCC) 06/15/2011  . Tobacco abuse 10/11/2010  . Anxiety state 05/12/2008  . Essential hypertension 05/12/2008    Past Surgical History:  Procedure Laterality Date  . BREATH TEK H PYLORI  07/11/2011   Procedure: BREATH TEK H PYLORI;  Surgeon: Mariella Saa, MD;  Location: [facility];  Service: General;  Laterality: N/A;  . CHOLECYSTECTOMY  10/01/2011   Procedure: LAPAROSCOPIC CHOLECYSTECTOMY WITH INTRAOPERATIVE CHOLANGIOGRAM;  Surgeon: Mariella Saa, MD;  Location: [facility];  Service: General;  Laterality: N/A;  . COLONOSCOPY    . GASTRIC ROUX-EN-Y  10/01/2011   Procedure: LAPAROSCOPIC ROUX-EN-Y GASTRIC;  Surgeon: Mariella Saa, MD;  Location: [facility];  Service: General;  Laterality: N/A;  . Left shoulder arthroscopic surgery  yrs ago  . tummy tuck  2005     OB History    Gravida  3   Para  1   Term  1   Preterm      AB  2   Living  1     SAB  2   TAB      Ectopic      Multiple      Live Births               Home Medications    Prior to Admission medications   Medication Sig Start Date End Date Taking? Authorizing Provider  Aspirin-Acetaminophen-Caffeine (EXCEDRIN EXTRA STRENGTH PO) Take 1 tablet by mouth 3 (three) times daily as needed (headache).    Yes [provider]  augmented betamethasone dipropionate (DIPROLENE-AF) 0.05 % ointment Apply topically daily. Patient taking differently: Apply 1 application  topically 3 (three) times daily as needed.  03/12/16  Yes Quentin Mulling, PA-C  cetirizine-pseudoephedrine (ZYRTEC-D) 5-120 MG tablet Take 1 tablet by mouth every 12 (twelve) hours as needed for allergies.    Yes [provider]  Chorionic Gonadotropin (HCG IJ) Inject 2 mg as directed 2 (two) times a week.   Yes [provider]  diphenhydrAMINE (BENADRYL) 25 MG tablet Take 25-50 mg by mouth 3 (three) times daily as needed for allergies.   Yes [provider]  DULoxetine (CYMBALTA) 60 MG capsule TAKE ONE CAPSULE BY MOUTH DAILY 05/26/17  Yes Lucky Cowboy, MD  ferrous sulfate 325 (65 FE) MG EC tablet Take 325 mg by mouth at bedtime.    Yes [provider]  gabapentin (NEURONTIN) 300 MG capsule TAKE ONE CAPSULE BY MOUTH THREE TIMES A DAY Patient taking differently: TAKE ONE CAPSULE BY MOUTH THREE TIMES A DAY as needed for pain 05/23/17  Yes Quentin Mulling, PA-C  Ginkgo Biloba (GINKOBA PO) Take 1 capsule by mouth daily.    Yes [provider]  Glucosamine-Chondroitin (COSAMIN DS PO) Take 1,500 mg by mouth daily.   Yes [provider]  lisinopril-hydrochlorothiazide (PRINZIDE,ZESTORETIC) 20-12.5 MG tablet TAKE 1 TABLET BY MOUTH EVERY DAY 05/22/17  Yes Judd Gaudier, NP  Magnesium 500 MG TABS Take 500 mg by mouth 2 (two) times daily.    Yes [provider]  metoCLOPramide (REGLAN) 10 MG tablet TAKE ONE TABLET BY MOUTH ONCE DAILY Patient taking differently: TAKE ONE TABLET BY MOUTH ONCE DAILY as needed heartburn 07/15/13  Yes Smith, Melissa, PA-C  Multiple Vitamins-Minerals (MULTIVITAMIN WITH MINERALS) tablet Take 1 tablet by mouth daily. .   Yes [provider]  phentermine 37.5 MG capsule Take 37.5 mg by mouth daily.    Yes [provider]  traMADol (ULTRAM) 50 MG tablet Take 2 tablets (100 mg total) by mouth 2 (two) times daily. 06/25/17 09/23/17 Yes Delano Metz, MD  traZODone (DESYREL) 150 MG tablet Take 1/3 to 1/2 to 1  tablet 1 hour before sleep ONLY if needed Patient taking differently: Take 75-150 mg by mouth at bedtime as needed for sleep. 1 hour before sleep 06/26/17  Yes Lucky Cowboy, MD  TURMERIC PO Take 1,000 mg by mouth daily.   Yes [provider]  Vitamin D, Ergocalciferol, (DRISDOL) 50000 units CAPS capsule TAKE ONE CAPSULE BY MOUTH TWICE WEEKLY 06/16/17  Yes Judd Gaudier, NP  benzonatate (TESSALON) 100 MG capsule Take 1 capsule (100 mg total) by mouth every 8 (eight) hours. 07/04/17   Arby Barrette, MD  HYDROcodone-homatropine [facility]) 5-1.5 MG/5ML syrup Take 5 mLs by mouth every 6 (six) hours as needed for cough. 07/04/17   Arby Barrette, MD  LORazepam (ATIVAN) 2 MG tablet TAKE 1/2 TO 1 TABLET AT BEDTIME  AS NEEDED FOR SLEEP. DO NOT EXCEED 5 TABLETS A WEEK Patient not taking: Reported on 07/04/2017 05/23/17   Quentin Mullingollier, Amanda, PA-C  naproxen (NAPROSYN) 500 MG tablet Take 1 tablet (500 mg total) by mouth 2 (two) times daily. 07/04/17   Arby BarrettePfeiffer, Shemia Bevel, MD  Spacer/Aero-Holding Chambers (AEROCHAMBER PLUS WITH MASK) inhaler Use as instructed 07/04/17   Arby BarrettePfeiffer, Virgia Kelner, MD    Family History Family History  Problem Relation Age of Onset  . Heart attack Maternal Grandfather   . Heart disease Maternal Grandfather   . Heart attack Paternal Grandfather   . Breast cancer Mother [id number]       breast  . Cancer Mother [id number]       breast/ ovarian  . Obesity Brother   . Breast cancer Maternal Aunt [id number]       died at age 51  . Cancer Maternal Aunt        breast  . Lung cancer Maternal Grandmother   . Cancer Maternal Grandmother        ovarian  . Heart disease Father   . Hypertension Father   . Stroke Father   . Multiple sclerosis Maternal Uncle   . Colon cancer Neg Hx   . Colon polyps Neg Hx   . Esophageal cancer Neg Hx   . Rectal cancer Neg Hx     Social History Social History   Tobacco Use  . Smoking status: Current Every Day Smoker    Packs/day: 0.25    Years: 7.00    Pack years: 1.75      Types: Cigarettes  . Smokeless tobacco: Never Used  Substance Use Topics  . Alcohol use: Yes    Alcohol/week: 0.6 oz    Types: 1 Glasses of wine per week    Comment: 1x week  . Drug use: No     Allergies   Patient has no known allergies.   Review of Systems Review of Systems 10 Systems reviewed and are negative for acute change except as noted in the HPI.   Physical Exam Updated Vital Signs BP 132/87 (BP Location: Right Arm)   Pulse 87   Temp 98.6 F (37 C) (Oral)   Resp 20   SpO2 97%   Physical Exam  Constitutional: She is oriented to person, place, and time. She appears well-developed and well-nourished. No distress.  HENT:  Head: Normocephalic and atraumatic.  Nose: Nose normal.  Mouth/Throat: Oropharynx is clear and moist.  Bilateral TMs normal with minimal serous effusion.  No erythema no bulging.  Eyes: Pupils are equal, round, and reactive to light. EOM are normal.  Neck: Neck supple.  Cardiovascular: Normal rate, regular rhythm, normal heart sounds and intact distal pulses.  Pulmonary/Chest: Effort normal and breath sounds normal.  Dry cough paroxysmal with deep inspiration.  No gross wheeze or rale.  No respiratory distress.  Patient endorses significant tenderness to palpation over the left anterior chest wall and sternal costal margins.  Abdominal: Soft. Bowel sounds are normal. She exhibits no distension. There is no tenderness.  Musculoskeletal: Normal range of motion. She exhibits no edema or tenderness.  Calf soft and nontender.  No peripheral edema.  Neurological: She is alert and oriented to person, place, and time. She has normal strength. Coordination normal. GCS eye subscore is 4. GCS verbal subscore is 5. GCS motor subscore is 6.  Skin: Skin is warm, dry and intact.  Psychiatric: She has a normal mood and affect.     ED Treatments / Results  Labs (all labs ordered are listed, but only abnormal results are displayed) Labs Reviewed  BASIC  METABOLIC PANEL - Abnormal; Notable for the following components:      Result Value   Glucose, Bld 107 (*)    All other components within normal limits  CBC - Abnormal; Notable for the following components:   RDW 16.0 (*)    All other components within normal limits  I-STAT TROPONIN, ED  I-STAT BETA HCG BLOOD, ED ([facility], [facility], [facility] ONLY)    EKG EKG Interpretation  Date/Time:  Thursday July 04 2017 10:49:38 EDT Ventricular Rate:  94 PR Interval:    QRS Duration: 95 QT Interval:  342 QTC Calculation: 428 R Axis:   63 Text Interpretation:  Sinus rhythm Paired ventricular premature complexes Borderline short PR interval Borderline repolarization abnormality Baseline wander in lead(s) II III aVF agree. no acute ischemic changes compared to previous Confirmed by Arby Barrette [phone]) on 07/04/2017 12:40:45 PM   Radiology Dg Chest 2 View  Result Date: 07/04/2017 CLINICAL DATA:  Cough.  Sharp chest pain. EXAM: CHEST - 2 VIEW COMPARISON:  Chest x-ray 09/12/2012. FINDINGS: Mediastinum hilar structures normal. Lungs are clear. No pleural effusion or pneumothorax. Heart size normal. No acute bony abnormality. Surgical clips right upper quadrant. IMPRESSION: No acute cardiopulmonary disease. Electronically Signed   By: Maisie Fus  Register   On: 07/04/2017 11:27    Procedures Procedures (including critical care time)  Medications Ordered in ED Medications  albuterol (PROVENTIL HFA;VENTOLIN HFA) 108 (90 Base) MCG/ACT inhaler 2 puff (has no administration in time range)     Initial Impression / Assessment and Plan / ED Course  I have reviewed the triage vital signs and the nursing notes.  Pertinent labs & imaging results that were available during my care of the patient were reviewed by me and considered in my medical decision making (see chart for details).      Final Clinical Impressions(s) / ED Diagnoses   Final diagnoses:  Acute bronchitis, unspecified organism  Chest wall pain  Upper  respiratory tract infection, unspecified type   Patient clinically well.  She is describing URI bronchitis type symptoms.  Chest x-ray shows no focal pneumonia.  [facility] complaint of left anterior chest pain.  Opponent is negative.  Patient's chest pain is very reproducible and she describes it as starting suddenly with bending over picking up her dog.  At this time I have very low suspicion for cardiac ischemic etiology.  Plan will be to treat for symptomatic acute bronchitis with predominant feature of dry paroxysmal cough.  Return precautions reviewed and follow-up plan discussed. ED Discharge Orders        Ordered    benzonatate (TESSALON) 100 MG capsule  Every 8 hours     07/04/17 1309    HYDROcodone-homatropine (HYCODAN) 5-1.5 MG/5ML syrup  Every 6 hours PRN     07/04/17 1309    Spacer/Aero-Holding Chambers (AEROCHAMBER PLUS WITH MASK) inhaler     07/04/17 1309    naproxen (NAPROSYN) 500 MG tablet  2 times daily     07/04/17 1310       Arby Barrette, MD 07/04/17 1318

## 2017-07-04 NOTE — ED Triage Notes (Signed)
[facility] for cough, fever, flu like symptoms. Was told to come here from chest tightness that started x4 weeks.

## 2017-07-08 ENCOUNTER — Encounter: Payer: Self-pay | Admitting: Pain Medicine

## 2017-07-08 ENCOUNTER — Other Ambulatory Visit: Payer: Self-pay

## 2017-07-08 ENCOUNTER — Ambulatory Visit: Payer: Managed Care, Other (non HMO) | Attending: Pain Medicine | Admitting: Pain Medicine

## 2017-07-08 VITALS — BP 137/83 | HR 92 | Temp 98.8°F | Resp 18 | Ht 67.0 in | Wt 213.0 lb

## 2017-07-08 DIAGNOSIS — Z9049 Acquired absence of other specified parts of digestive tract: Secondary | ICD-10-CM | POA: Diagnosis not present

## 2017-07-08 DIAGNOSIS — G8929 Other chronic pain: Secondary | ICD-10-CM

## 2017-07-08 DIAGNOSIS — M25511 Pain in right shoulder: Secondary | ICD-10-CM | POA: Insufficient documentation

## 2017-07-08 DIAGNOSIS — M7062 Trochanteric bursitis, left hip: Secondary | ICD-10-CM

## 2017-07-08 DIAGNOSIS — Z7901 Long term (current) use of anticoagulants: Secondary | ICD-10-CM | POA: Insufficient documentation

## 2017-07-08 DIAGNOSIS — G4700 Insomnia, unspecified: Secondary | ICD-10-CM | POA: Insufficient documentation

## 2017-07-08 DIAGNOSIS — M25551 Pain in right hip: Secondary | ICD-10-CM

## 2017-07-08 DIAGNOSIS — R7303 Prediabetes: Secondary | ICD-10-CM | POA: Insufficient documentation

## 2017-07-08 DIAGNOSIS — K219 Gastro-esophageal reflux disease without esophagitis: Secondary | ICD-10-CM | POA: Diagnosis not present

## 2017-07-08 DIAGNOSIS — Z79899 Other long term (current) drug therapy: Secondary | ICD-10-CM | POA: Diagnosis not present

## 2017-07-08 DIAGNOSIS — M47812 Spondylosis without myelopathy or radiculopathy, cervical region: Secondary | ICD-10-CM | POA: Insufficient documentation

## 2017-07-08 DIAGNOSIS — Z7982 Long term (current) use of aspirin: Secondary | ICD-10-CM | POA: Insufficient documentation

## 2017-07-08 DIAGNOSIS — Z79891 Long term (current) use of opiate analgesic: Secondary | ICD-10-CM | POA: Insufficient documentation

## 2017-07-08 DIAGNOSIS — M160 Bilateral primary osteoarthritis of hip: Secondary | ICD-10-CM | POA: Insufficient documentation

## 2017-07-08 DIAGNOSIS — M7072 Other bursitis of hip, left hip: Secondary | ICD-10-CM | POA: Diagnosis not present

## 2017-07-08 DIAGNOSIS — E559 Vitamin D deficiency, unspecified: Secondary | ICD-10-CM | POA: Diagnosis not present

## 2017-07-08 DIAGNOSIS — M797 Fibromyalgia: Secondary | ICD-10-CM | POA: Diagnosis not present

## 2017-07-08 DIAGNOSIS — M25512 Pain in left shoulder: Secondary | ICD-10-CM

## 2017-07-08 DIAGNOSIS — M7071 Other bursitis of hip, right hip: Secondary | ICD-10-CM | POA: Insufficient documentation

## 2017-07-08 DIAGNOSIS — Z9884 Bariatric surgery status: Secondary | ICD-10-CM | POA: Diagnosis not present

## 2017-07-08 DIAGNOSIS — M9689 Other intraoperative and postprocedural complications and disorders of the musculoskeletal system: Secondary | ICD-10-CM | POA: Insufficient documentation

## 2017-07-08 DIAGNOSIS — M7061 Trochanteric bursitis, right hip: Secondary | ICD-10-CM | POA: Diagnosis not present

## 2017-07-08 DIAGNOSIS — M479 Spondylosis, unspecified: Secondary | ICD-10-CM | POA: Insufficient documentation

## 2017-07-08 DIAGNOSIS — M25552 Pain in left hip: Secondary | ICD-10-CM

## 2017-07-08 DIAGNOSIS — G894 Chronic pain syndrome: Secondary | ICD-10-CM | POA: Diagnosis not present

## 2017-07-08 DIAGNOSIS — Z6833 Body mass index (BMI) 33.0-33.9, adult: Secondary | ICD-10-CM | POA: Diagnosis not present

## 2017-07-08 DIAGNOSIS — F411 Generalized anxiety disorder: Secondary | ICD-10-CM | POA: Insufficient documentation

## 2017-07-08 DIAGNOSIS — R52 Pain, unspecified: Secondary | ICD-10-CM | POA: Diagnosis not present

## 2017-07-08 DIAGNOSIS — Z720 Tobacco use: Secondary | ICD-10-CM | POA: Insufficient documentation

## 2017-07-08 DIAGNOSIS — I10 Essential (primary) hypertension: Secondary | ICD-10-CM | POA: Insufficient documentation

## 2017-07-08 NOTE — Progress Notes (Signed)
Patient's Name: Wendy Valencia  MRN: [id number]  Referring Provider: Unk Pinto, MD  DOB: 1965-04-22  PCP: Unk Pinto, MD  DOS: 07/08/2017  Note by: Gaspar Cola, MD  Service setting: Ambulatory outpatient  Specialty: Interventional Pain Management  Location: [facility] (AMB) Pain Management Facility    Patient type: Established   Primary Reason(s) for Visit: Encounter for post-procedure evaluation of chronic illness with mild to moderate exacerbation CC: Hip Pain (bilateral)  HPI  Wendy Valencia is a 52 y.o. year old, female patient, who comes today for a post-procedure evaluation. She has Anxiety state; Essential hypertension; Tobacco abuse; Morbid obesity ([place]); Prediabetes; Insomnia; Medication management; Vitamin D deficiency; S/P gastric bypass; Cervical spondylosis without myelopathy; Fibromyalgia affecting multiple sites (Secondary Area of Pain); Spondylosis of lumbar spine; Chronic generalized pain (Primary Area of Pain); Chronic pain syndrome; Long term current use of opiate analgesic; Long term prescription benzodiazepine use; Pharmacologic therapy; Disorder of skeletal system; Problems influencing health status; Chronic shoulder pain (Tertiary Area of Pain) (Bilateral) (R>L); Chronic hip pain (Fourth Area of Pain) (Bilateral) (R>L); Greater trochanteric bursitis of hips (Bilateral); and Osteoarthritis of hips (Bilateral) on their problem list. Her primarily concern today is the Hip Pain (bilateral)  Pain Assessment: Location: Right, Left Hip Radiating: denies Onset: More than a month ago Duration: Chronic pain Quality: Dull Severity: 1 /10 (subjective, self-reported pain score)  Note: Reported level is compatible with observation.                         When using our objective Pain Scale, levels between 6 and 10/10 are said to belong in an emergency room, as it progressively worsens from a 6/10, described as severely limiting, requiring emergency care not usually  available at an outpatient pain management facility. At a 6/10 level, communication becomes difficult and requires great effort. Assistance to reach the emergency department may be required. Facial flushing and profuse sweating along with potentially dangerous increases in heart rate and blood pressure will be evident. Timing: Intermittent Modifying factors: medications, soaks, heat, cold BP: 137/83  HR: 92  Wendy Valencia comes in today for post-procedure evaluation after the treatment done on 06/26/2017.  Further details on both, my assessment(s), as well as the proposed treatment plan, please see below.  Post-Procedure Assessment  06/25/2017 Procedure: Diagnostic bilateral intra-articular hip joint injection #1  + bilateral trochanteric bursa injection #1, under fluoroscopic guidance and IV sedation  Pre-procedure pain score:  5/10 Post-procedure pain score: 0/10 (100% relief) Influential Factors: BMI: 33.36 kg/m Intra-procedural challenges: None observed.         Assessment challenges: None detected.              Reported side-effects: None.        Post-procedural adverse reactions or complications: None reported         Sedation: Sedation provided. When no sedatives are used, the analgesic levels obtained are directly associated to the effectiveness of the local anesthetics. However, when sedation is provided, the level of analgesia obtained during the initial 1 hour following the intervention, is believed to be the result of a combination of factors. These factors may include, but are not limited to: 1. The effectiveness of the local anesthetics used. 2. The effects of the analgesic(s) and/or anxiolytic(s) used. 3. The degree of discomfort experienced by the patient at the time of the procedure. 4. The patients ability and reliability in recalling and recording the events. 5. The presence and influence  of possible secondary gains and/or psychosocial factors. Reported result: Relief  experienced during the 1st hour after the procedure: 100 % (Ultra-Short Term Relief)            Interpretative annotation: Clinically appropriate result. Analgesia during this period is likely to be Local Anesthetic and/or IV Sedative (Analgesic/Anxiolytic) related.          Effects of local anesthetic: The analgesic effects attained during this period are directly associated to the localized infiltration of local anesthetics and therefore cary significant diagnostic value as to the etiological location, or anatomical origin, of the pain. Expected duration of relief is directly dependent on the pharmacodynamics of the local anesthetic used. Long-acting (4-6 hours) anesthetics used.  Reported result: Relief during the next 4 to 6 hour after the procedure: 100 % (Short-Term Relief)            Interpretative annotation: Clinically appropriate result. Analgesia during this period is likely to be Local Anesthetic-related.          Long-term benefit: Defined as the period of time past the expected duration of local anesthetics (1 hour for short-acting and 4-6 hours for long-acting). With the possible exception of prolonged sympathetic blockade from the local anesthetics, benefits during this period are typically attributed to, or associated with, other factors such as analgesic sensory neuropraxia, antiinflammatory effects, or beneficial biochemical changes provided by agents other than the local anesthetics.  Reported result: Extended relief following procedure: 70 % (Long-Term Relief)            Interpretative annotation: Clinically appropriate result. Good relief. No permanent benefit expected. Inflammation plays a part in the etiology to the pain.          Current benefits: Defined as reported results that persistent at this point in time.   Analgesia: 75-100 % [name] reports improvement of arthralgia. Function: Wendy Valencia reports improvement in function ROM: Wendy Valencia reports  improvement in ROM Interpretative annotation: Ongoing benefit. Therapeutic benefit observed. Effective therapeutic approach. Benefit could be steroid-related.  Interpretation: Results would suggest a successful diagnostic intervention.                  Plan:  Set up procedure as a PRN palliative treatment option for this patient.                Laboratory Chemistry  Inflammation Markers (CRP: Acute Phase) (ESR: Chronic Phase) Lab Results  Component Value Date   CRP 0.9 03/04/2017   ESRSEDRATE 22 (H) 03/04/2017                         Renal Markers Lab Results  Component Value Date   BUN 13 07/04/2017   CREATININE 0.65 78/67/5449   BCR NOT APPLICABLE 20/10/710   GFRAA >60 07/04/2017   GFRNONAA >60 07/04/2017                             Hepatic Markers Lab Results  Component Value Date   AST 16 03/04/2017   ALT 14 03/04/2017   ALBUMIN 4.5 03/05/2016                        Neuropathy Markers Lab Results  Component Value Date   HGBA1C 5.8 (H) 03/04/2017   HIV NON REACTIVE 02/16/2013  Hematology Parameters Lab Results  Component Value Date   PLT 393 07/04/2017   HGB 13.6 07/04/2017   HCT 41.7 07/04/2017                        CV Markers Lab Results  Component Value Date   TROPONINI <0.30 09/12/2012                         Note: Lab results reviewed.  Recent Diagnostic Imaging Results  DG Chest 2 View CLINICAL DATA:  Cough.  Sharp chest pain.  EXAM: CHEST - 2 VIEW  COMPARISON:  Chest x-ray 09/12/2012.  FINDINGS: Mediastinum hilar structures normal. Lungs are clear. No pleural effusion or pneumothorax. Heart size normal. No acute bony abnormality. Surgical clips right upper quadrant.  IMPRESSION: No acute cardiopulmonary disease.  Electronically Signed   By: Marcello Moores  Register   On: 07/04/2017 11:27  Complexity Note: I personally reviewed the fluoroscopic imaging of the procedure.                        Meds   Current  Outpatient Medications:  .  Albuterol Sulfate (PROVENTIL HFA IN), Inhale 108 mcg into the lungs as needed., Disp: , Rfl:  .  Aspirin-Acetaminophen-Caffeine (EXCEDRIN EXTRA STRENGTH PO), Take 1 tablet by mouth 3 (three) times daily as needed (headache). , Disp: , Rfl:  .  augmented betamethasone dipropionate (DIPROLENE-AF) 0.05 % ointment, Apply topically daily. (Patient taking differently: Apply 1 application topically 3 (three) times daily as needed. ), Disp: 50 g, Rfl: 3 .  benzonatate (TESSALON) 100 MG capsule, Take 1 capsule (100 mg total) by mouth every 8 (eight) hours., Disp: 21 capsule, Rfl: 0 .  cetirizine-pseudoephedrine (ZYRTEC-D) 5-120 MG tablet, Take 1 tablet by mouth every 12 (twelve) hours as needed for allergies. , Disp: , Rfl:  .  Chorionic Gonadotropin (HCG IJ), Inject 2 mg as directed 2 (two) times a week., Disp: , Rfl:  .  diphenhydrAMINE (BENADRYL) 25 MG tablet, Take 25-50 mg by mouth 3 (three) times daily as needed for allergies., Disp: , Rfl:  .  DULoxetine (CYMBALTA) 60 MG capsule, TAKE ONE CAPSULE BY MOUTH DAILY, Disp: 90 capsule, Rfl: 1 .  ferrous sulfate 325 (65 FE) MG EC tablet, Take 325 mg by mouth at bedtime. , Disp: , Rfl:  .  gabapentin (NEURONTIN) 300 MG capsule, TAKE ONE CAPSULE BY MOUTH THREE TIMES A DAY (Patient taking differently: TAKE ONE CAPSULE BY MOUTH THREE TIMES A DAY as needed for pain), Disp: 90 capsule, Rfl: 0 .  Ginkgo Biloba (GINKOBA PO), Take 1 capsule by mouth daily. , Disp: , Rfl:  .  Glucosamine-Chondroitin (COSAMIN DS PO), Take 1,500 mg by mouth daily., Disp: , Rfl:  .  HYDROcodone-homatropine (HYCODAN) 5-1.5 MG/5ML syrup, Take 5 mLs by mouth every 6 (six) hours as needed for cough., Disp: 120 mL, Rfl: 0 .  lisinopril-hydrochlorothiazide (PRINZIDE,ZESTORETIC) 20-12.5 MG tablet, TAKE 1 TABLET BY MOUTH EVERY DAY, Disp: 90 tablet, Rfl: 0 .  Magnesium 500 MG TABS, Take 500 mg by mouth 2 (two) times daily. , Disp: , Rfl:  .  metoCLOPramide (REGLAN) 10 MG  tablet, TAKE ONE TABLET BY MOUTH ONCE DAILY (Patient taking differently: TAKE ONE TABLET BY MOUTH ONCE DAILY as needed heartburn), Disp: 90 tablet, Rfl: 0 .  Multiple Vitamins-Minerals (MULTIVITAMIN WITH MINERALS) tablet, Take 1 tablet by mouth daily. ., Disp: , Rfl:  .  naproxen (NAPROSYN) 500 MG tablet, Take 1 tablet (500 mg total) by mouth 2 (two) times daily., Disp: 30 tablet, Rfl: 0 .  phentermine 37.5 MG capsule, Take 37.5 mg by mouth daily. , Disp: , Rfl:  .  Spacer/Aero-Holding Chambers (AEROCHAMBER PLUS WITH MASK) inhaler, Use as instructed, Disp: 1 each, Rfl: 2 .  traMADol (ULTRAM) 50 MG tablet, Take 2 tablets (100 mg total) by mouth 2 (two) times daily., Disp: 120 tablet, Rfl: 2 .  traZODone (DESYREL) 150 MG tablet, Take 1/3 to 1/2 to 1 tablet 1 hour before sleep ONLY if needed (Patient taking differently: Take 75-150 mg by mouth at bedtime as needed for sleep. 1 hour before sleep), Disp: 30 tablet, Rfl: 0 .  TURMERIC PO, Take 1,000 mg by mouth daily., Disp: , Rfl:  .  Vitamin D, Ergocalciferol, (DRISDOL) 50000 units CAPS capsule, TAKE ONE CAPSULE BY MOUTH TWICE WEEKLY, Disp: 30 capsule, Rfl: 0  ROS  Constitutional: Denies any fever or chills Gastrointestinal: No reported hemesis, hematochezia, vomiting, or acute GI distress Musculoskeletal: Denies any acute onset joint swelling, redness, loss of ROM, or weakness Neurological: No reported episodes of acute onset apraxia, aphasia, dysarthria, agnosia, amnesia, paralysis, loss of coordination, or loss of consciousness  Allergies  Ms. Dreese has No Known Allergies.  PFSH  Drug: Ms. Mignano  reports that she does not use drugs. Alcohol:  reports that she drinks about 0.6 oz of alcohol per week. Tobacco:  reports that she has been smoking cigarettes.  She has a 1.75 pack-year smoking history. She has never used smokeless tobacco. Medical:  has a past medical history of Allergy, Anemia, Anxiety, Arthritis, Fibromyalgia, GERD  (gastroesophageal reflux disease), Hypertension, IBS (irritable bowel syndrome), Nephrolithiasis, and Plantar fasciitis of right foot. Surgical: Ms. Avitia  has a past surgical history that includes Left shoulder arthroscopic surgery (yrs ago); tummy tuck (2005); Breath tek h pylori (07/11/2011); Gastric Roux-En-Y (10/01/2011); Cholecystectomy (10/01/2011); and Colonoscopy. Family: family history includes Breast cancer (age of onset: 27) in her mother; Breast cancer (age of onset: 15) in her maternal aunt; Cancer in her maternal aunt and maternal grandmother; Cancer (age of onset: 62) in her mother; Heart attack in her maternal grandfather and paternal grandfather; Heart disease in her father and maternal grandfather; Hypertension in her father; Lung cancer in her maternal grandmother; Multiple sclerosis in her maternal uncle; Obesity in her brother; Stroke in her father.  Constitutional Exam  General appearance: Well nourished, well developed, and well hydrated. In no apparent acute distress Vitals:   07/08/17 0857  BP: 137/83  Pulse: 92  Resp: 18  Temp: 98.8 F (37.1 C)  TempSrc: Oral  SpO2: 100%  Weight: 213 lb (96.6 kg)  Height: _0  (1.702 m)   BMI Assessment: Estimated body mass index is 33.36 kg/m as calculated from the following:   Height as of this encounter: _1  (1.702 m).   Weight as of this encounter: 213 lb (96.6 kg).  BMI interpretation table: BMI level Category Range association with higher incidence of chronic pain  <18 kg/m2 Underweight   18.5-24.9 kg/m2 Ideal body weight   25-29.9 kg/m2 Overweight Increased incidence by 20%  30-34.9 kg/m2 Obese (Class I) Increased incidence by 68%  35-39.9 kg/m2 Severe obesity (Class II) Increased incidence by 136%  >40 kg/m2 Extreme obesity (Class III) Increased incidence by 254%   Patient's current BMI Ideal Body weight  Body mass index is 33.36 kg/m. Ideal body weight: 61.6 kg (135 lb 12.9 oz) Adjusted ideal  body weight:  75.6 kg (166 lb 10.9 oz)   BMI Readings from Last 4 Encounters:  07/08/17 33.36 kg/m  06/25/17 33.67 kg/m  06/03/17 32.11 kg/m  05/13/17 32.11 kg/m   Wt Readings from Last 4 Encounters:  07/08/17 213 lb (96.6 kg)  06/25/17 215 lb (97.5 kg)  06/03/17 205 lb (93 kg)  05/13/17 205 lb (93 kg)  Psych/Mental status: Alert, oriented x 3 (person, place, & time)       Eyes: PERLA Respiratory: No evidence of acute respiratory distress  Cervical Spine Area Exam  Skin & Axial Inspection: No masses, redness, edema, swelling, or associated skin lesions Alignment: Symmetrical Functional ROM: Unrestricted ROM      Stability: No instability detected Muscle Tone/Strength: Functionally intact. No obvious neuro-muscular anomalies detected. Sensory (Neurological): Unimpaired Palpation: No palpable anomalies              Upper Extremity (UE) Exam    Side: Right upper extremity  Side: Left upper extremity  Skin & Extremity Inspection: Skin color, temperature, and hair growth are WNL. No peripheral edema or cyanosis. No masses, redness, swelling, asymmetry, or associated skin lesions. No contractures.  Skin & Extremity Inspection: Skin color, temperature, and hair growth are WNL. No peripheral edema or cyanosis. No masses, redness, swelling, asymmetry, or associated skin lesions. No contractures.  Functional ROM: Unrestricted ROM          Functional ROM: Unrestricted ROM          Muscle Tone/Strength: Functionally intact. No obvious neuro-muscular anomalies detected.  Muscle Tone/Strength: Functionally intact. No obvious neuro-muscular anomalies detected.  Sensory (Neurological): Unimpaired          Sensory (Neurological): Unimpaired          Palpation: No palpable anomalies              Palpation: No palpable anomalies              Provocative Test(s):  Phalen's test: deferred Tinel's test: deferred Apley's scratch test (touch opposite shoulder):  Action 1 (Across chest): deferred Action 2  (Overhead): deferred Action 3 (LB reach): deferred   Provocative Test(s):  Phalen's test: deferred Tinel's test: deferred Apley's scratch test (touch opposite shoulder):  Action 1 (Across chest): deferred Action 2 (Overhead): deferred Action 3 (LB reach): deferred    Thoracic Spine Area Exam  Skin & Axial Inspection: No masses, redness, or swelling Alignment: Symmetrical Functional ROM: Unrestricted ROM Stability: No instability detected Muscle Tone/Strength: Functionally intact. No obvious neuro-muscular anomalies detected. Sensory (Neurological): Unimpaired Muscle strength & Tone: No palpable anomalies  Lumbar Spine Area Exam  Skin & Axial Inspection: No masses, redness, or swelling Alignment: Symmetrical Functional ROM: Unrestricted ROM       Stability: No instability detected Muscle Tone/Strength: Functionally intact. No obvious neuro-muscular anomalies detected. Sensory (Neurological): Unimpaired Palpation: No palpable anomalies       Provocative Tests: Lumbar Hyperextension/rotation test: deferred today       Lumbar quadrant test (Kemp's test): deferred today       Lumbar Lateral bending test: deferred today       Patrick's Maneuver: deferred today                   FABER test: deferred today       Thigh-thrust test: deferred today       S-I compression test: deferred today       S-I distraction test: deferred today        Gait &  Posture Assessment  Ambulation: Unassisted Gait: Relatively normal for age and body habitus Posture: WNL   Lower Extremity Exam    Side: Right lower extremity  Side: Left lower extremity  Stability: No instability observed          Stability: No instability observed          Skin & Extremity Inspection: Skin color, temperature, and hair growth are WNL. No peripheral edema or cyanosis. No masses, redness, swelling, asymmetry, or associated skin lesions. No contractures.  Skin & Extremity Inspection: Skin color, temperature, and hair growth  are WNL. No peripheral edema or cyanosis. No masses, redness, swelling, asymmetry, or associated skin lesions. No contractures.  Functional ROM: Unrestricted ROM                  Functional ROM: Unrestricted ROM                  Muscle Tone/Strength: Functionally intact. No obvious neuro-muscular anomalies detected.  Muscle Tone/Strength: Functionally intact. No obvious neuro-muscular anomalies detected.  Sensory (Neurological): Unimpaired  Sensory (Neurological): Unimpaired  Palpation: No palpable anomalies  Palpation: No palpable anomalies   Assessment  Primary Diagnosis & Pertinent Problem List: The primary encounter diagnosis was Chronic hip pain (Fourth Area of Pain) (Bilateral) (R>L). Diagnoses of Greater trochanteric bursitis of hips (Bilateral), Chronic generalized pain (Primary Area of Pain), Fibromyalgia affecting multiple sites (Secondary Area of Pain), and Chronic shoulder pain (Tertiary Area of Pain) (Bilateral) (R>L) were also pertinent to this visit.  Status Diagnosis  Improved Improved Controlled 1. Chronic hip pain (Fourth Area of Pain) (Bilateral) (R>L)   2. Greater trochanteric bursitis of hips (Bilateral)   3. Chronic generalized pain (Primary Area of Pain)   4. Fibromyalgia affecting multiple sites (Secondary Area of Pain)   5. Chronic shoulder pain (Tertiary Area of Pain) (Bilateral) (R>L)     Problems updated and reviewed during this visit: No problems updated. Plan of Care  Pharmacotherapy (Medications Ordered): No orders of the defined types were placed in this encounter.  Medications administered today: Sheneka A. Shirkey had no medications administered during this visit.  Procedure Orders    No procedure(s) ordered today   Lab Orders  No laboratory test(s) ordered today   Imaging Orders  No imaging studies ordered today    Referral Orders     Ambulatory referral to Physical Therapy  Interventional management options: Planned, scheduled, and/or  pending:   Non at this time.   Considering:   Diagnostic bilateral intra-articular shoulder joint injection  Diagnostic bilateral suprascapular nerve block  Possible bilateral suprascapular nerve RFA  Diagnostic bilateral intra-articular hip joint injection  Diagnostic/therapeutic bilateral trochanteric bursa injection  Diagnostic bilateral femoral nerve + obturator nerve block  Possible bilateral femoral nerve + obturator nerve RFA  Diagnostic/therapeutic Lidocaine infusion  Diagnostic/therapeutic Trigger point injection  Diagnostic bilateral cervical facet block  Diagnostic bilateral cervical facet RFA    Palliative PRN treatment(s):   Diagnostic bilateral intra-articular hip joint injection #2  + bilateral trochanteric bursa injection #2, under fluoroscopic guidance and IV sedation    Provider-requested follow-up: Return in about 2 months (around 09/16/2017) for PRN Procedure, Med-Mgmt, w/ Dionisio David, NP.  Future Appointments  Date Time Provider [place]  09/02/2017  9:00 AM Vicie Mutters, PA-C [facility] None  09/18/2017  8:45 AM Vevelyn Francois, NP [facility] None   Primary Care Physician: Unk Pinto, MD Location: [facility] Note by: Gaspar Cola, MD Date: 07/08/2017; Time:  11:03 AM

## 2017-07-08 NOTE — Progress Notes (Signed)
Safety precautions to be maintained throughout the outpatient stay will include: orient to surroundings, keep bed in low position, maintain call bell within reach at all times, provide assistance with transfer out of bed and ambulation.  

## 2017-07-08 NOTE — Patient Instructions (Addendum)
Sleep inducer: Melatonin 10 mg at bedtime.  ____________________________________________________________________________________________  Initial Gabapentin Titration  Medication used: Gabapentin (Generic Name) or Neurontin (Brand Name) 300 mg tablets/capsules  Reasons to stop increasing the dose:  Reason 1: You get good relief of symptoms, in which case there is no need to increase the daily dose any further.    Reason 2: You develop some side effects, such as sleeping all of the time, difficulty concentrating, or becoming disoriented, in which case you need to go down on the dose, to the prior level, where you were not experiencing any side effects. Stay on that dose longer, to allow more time for your body to get use it, before attempting to increase it again.   Steps: Step 1: Start by taking 1 (one) tablet at bedtime x 7 (seven) days.  Step 2: After being on 1 (one) tablet for 7 (seven) days, then increase it to 2 (two) tablets at bedtime for another 7 (seven) days.  Step 3: Next, after being on 2 (two) tablets at bedtime for 7 (seven) days, then increase it to 3 (three) tablets at bedtime, and stay on that dose until you see your doctor.  Reasons to stop increasing the dose: Reason 1: You get good relief of symptoms, in which case there is no need to increase the daily dose any further.  Reason 2: You develop some side effects, such as sleeping all of the time, difficulty concentrating, or becoming disoriented, in which case you need to go down on the dose, to the prior level, where you were not experiencing any side effects. Stay on that dose longer, to allow more time for your body to get use it, before attempting to increase it again.  Endpoint: Once you have reached the maximum dose you can tolerate without side-effects, contact your physician so as to evaluate the results of the regimen.   Questions: Feel free to contact us for any questions or problems at [phone]  [phone] ____________________________________________________________________________________________

## 2017-07-30 ENCOUNTER — Encounter: Payer: Self-pay | Admitting: Internal Medicine

## 2017-07-30 ENCOUNTER — Other Ambulatory Visit: Payer: Self-pay | Admitting: Internal Medicine

## 2017-07-30 DIAGNOSIS — G4700 Insomnia, unspecified: Secondary | ICD-10-CM

## 2017-07-30 MED ORDER — DOXEPIN HCL 25 MG PO CAPS: ORAL_CAPSULE | ORAL | 0 refills | Status: DC

## 2017-08-13 ENCOUNTER — Encounter: Payer: Self-pay | Admitting: Physical Therapy

## 2017-08-13 ENCOUNTER — Ambulatory Visit: Payer: Managed Care, Other (non HMO) | Attending: Pain Medicine | Admitting: Physical Therapy

## 2017-08-13 ENCOUNTER — Other Ambulatory Visit: Payer: Self-pay

## 2017-08-13 DIAGNOSIS — M25551 Pain in right hip: Secondary | ICD-10-CM | POA: Diagnosis not present

## 2017-08-13 DIAGNOSIS — R262 Difficulty in walking, not elsewhere classified: Secondary | ICD-10-CM | POA: Diagnosis present

## 2017-08-13 NOTE — Patient Instructions (Signed)
<  HTML><META HTTP-EQUIV="content-type" CONTENT="text/html;charset=utf-8"><STRONG>             Access Code: [email or web address]       <BR>URL: [email or web address]       <BR><SPAN id="[email or web address]">Date: 08/13/2017</SPAN>                                                     <BR>Prepared by: Stacie GlazeMichael Albright                             </STRONG>      <BR><BR><SPAN>Exercises</SPAN>      <UL class="program-note-list"><LI>Seated Hamstring Stretch with Chair                 - 5 reps                 - 2 sets                 - 30 hold                         - 2x daily                         - 7x weekly                </LI><LI>Supine Piriformis Stretch with Foot on Ground                 - 5 reps                 - 2 sets                 - 20 hold                         - 2x daily                         - 7x weekly                </LI><LI>Supine ITB Stretch with Strap                 - 3 reps                 - 2 sets                 - 20 hold                         - 2x daily                         - 7x weekly                </LI></UL>

## 2017-08-13 NOTE — Therapy (Signed)
[facility] [place] [address] [place], [place], [address] Phone: [phone]   Fax:  [phone]  Physical Therapy Evaluation  Patient Details  Name: Wendy Valencia MRN: [id number] Date of Birth: 07-17-1965 Referring Provider: Eino Farber   Encounter Date: 08/13/2017  PT End of Session - 08/13/17 1742    Visit Number  1    Date for PT Re-Evaluation  10/14/17    PT Start Time  1714    PT Stop Time  1755    PT Time Calculation (min)  41 min    Activity Tolerance  Patient tolerated treatment well    Behavior During Therapy  [facility] for tasks assessed/performed       Past Medical History:  Diagnosis Date  . Allergy   . Anemia   . Anxiety   . Arthritis   . Fibromyalgia   . GERD (gastroesophageal reflux disease)   . Hypertension   . IBS (irritable bowel syndrome)   . Nephrolithiasis   . Plantar fasciitis of right foot    wears boot     Past Surgical History:  Procedure Laterality Date  . BREATH TEK H PYLORI  07/11/2011   Procedure: BREATH TEK H PYLORI;  Surgeon: Mariella Saa, MD;  Location: [facility];  Service: General;  Laterality: N/A;  . CHOLECYSTECTOMY  10/01/2011   Procedure: LAPAROSCOPIC CHOLECYSTECTOMY WITH INTRAOPERATIVE CHOLANGIOGRAM;  Surgeon: Mariella Saa, MD;  Location: [facility];  Service: General;  Laterality: N/A;  . COLONOSCOPY    . GASTRIC ROUX-EN-Y  10/01/2011   Procedure: LAPAROSCOPIC ROUX-EN-Y GASTRIC;  Surgeon: Mariella Saa, MD;  Location: [facility];  Service: General;  Laterality: N/A;  . Left shoulder arthroscopic surgery  yrs ago  . tummy tuck  2005    There were no vitals filed for this visit.   Subjective Assessment - 08/13/17 1719    Subjective  Patient reports that she has had fibromyalgia for about 15 years, she reports that over the past 6 months she has had increased hip pain.  She is unsure of a cause but reports that she started yoga to help with the fibromyalgia and  feels that some of the pain could have been this    Limitations  Lifting;Standing;Walking;House hold activities    Patient Stated Goals  have less pain    Currently in Pain?  Yes    Pain Score  4     Pain Location  Hip    Pain Orientation  Right;Left    Pain Descriptors / Indicators  Aching;Tightness;Sore    Pain Type  Acute pain    Pain Onset  More than a month ago    Pain Frequency  Constant    Aggravating Factors   stress, yoga, after sitting still and going to move pain can be up to 10/10    Pain Relieving Factors  an injection  helped recently pain can be a 3/10    Effect of Pain on Daily Activities  limits activity due to pain, sometimes just really feel bad         [facility] PT Assessment - 08/13/17 0001      Assessment   Medical Diagnosis  fibromyalgia, hip pain    Referring Provider  [place]    Onset Date/Surgical Date  07/14/17    Prior Therapy  no      Precautions   Precautions  None      Balance Screen   Has the patient fallen  in the past 6 months  No    Has the patient had a decrease in activity level because of a fear of falling?   No    Is the patient reluctant to leave their home because of a fear of falling?   No      Home Environment   Additional Comments  has stairs at work, does housework,       Prior Function   Level of Independence  Independent    Vocation  Full time employment    Vocation Requirements  sitting, mostly      Posture/Postural Control   Posture Comments  fwd head, rounded shoulder      ROM / Strength   AROM / PROM / Strength  AROM;Strength      AROM   Overall AROM Comments  right hip 90 degrees flexion, lumbar flexion WNL's, extension decreased 50%, hip abduction 10 degrees with pain      Strength   Overall Strength Comments  right hip flexion 4-/5, right hip abduction 3+/5 with pain      Flexibility   Soft Tissue Assessment /Muscle Length  yes    Hamstrings  very tight    ITB  very tight with pain    Piriformis  tight with pain       Palpation   Palpation comment  she is very tender in the right GT area and into the right buttock                Objective measurements completed on examination: See above findings.      [facility] Adult PT Treatment/Exercise - 08/13/17 0001      Modalities   Modalities  Electrical Stimulation;Moist Heat      Moist Heat Therapy   Number Minutes Moist Heat  15 Minutes    Moist Heat Location  Hip      Electrical Stimulation   Electrical Stimulation Location  right GT and buttock area    Electrical Stimulation Action  IFC    Electrical Stimulation Parameters  supine    Electrical Stimulation Goals  Pain               PT Short Term Goals - 08/13/17 1757      PT SHORT TERM GOAL #1   Title  independent with initial HEP    Time  2    Period  Weeks    Status  New        PT Long Term Goals - 08/13/17 1757      PT LONG TERM GOAL #1   Title  understand posture and ergonomics set up     Time  8    Period  Weeks    Status  New      PT LONG TERM GOAL #2   Title  gym activities safely    Time  8    Period  Weeks    Status  New      PT LONG TERM GOAL #3   Title  report pain decreased 25%    Time  8    Period  Weeks    Status  New      PT LONG TERM GOAL #4   Title  increase right hip strength to 4+/5    Time  8    Period  Weeks    Status  New             Plan - 08/13/17 1742    Clinical  Impression Statement  Patient reports that she has fibromyalgia but that recently her hips have been really hurting.  She had a cortisone injection that helped some.  She has tight HS, piriformis and ITB.  She is very weak for right hip abduction due to pain.  She is very tender in the right GT and buttock.    Clinical Presentation  Stable    Clinical Decision Making  Low    Rehab Potential  Good    PT Frequency  2x / week    PT Duration  8 weeks    PT Treatment/Interventions  ADLs/Self Care Home Management;Cryotherapy;Electrical Stimulation;Iontophoresis  4mg /ml Dexamethasone;Moist Heat;Therapeutic activities;Therapeutic exercise;Balance training;Neuromuscular re-education;Patient/family education;Manual techniques    PT Next Visit Plan  try stretching and gym activity, she may try to get her own TENS    Consulted and Agree with Plan of Care  Patient       Patient will benefit from skilled therapeutic intervention in order to improve the following deficits and impairments:  Abnormal gait, Decreased range of motion, Difficulty walking, Increased muscle spasms, Pain, Impaired flexibility, Improper body mechanics, Postural dysfunction, Decreased strength, Decreased mobility  Visit Diagnosis: Pain in right hip - Plan: PT plan of care cert/re-cert  Difficulty in walking, not elsewhere classified - Plan: PT plan of care cert/re-cert     Problem List Patient Active Problem List   Diagnosis Date Noted  . Greater trochanteric bursitis of hips (Bilateral) 06/24/2017  . Osteoarthritis of hips (Bilateral) 06/24/2017  . Chronic generalized pain (Primary Area of Pain) 04/15/2017  . Chronic pain syndrome 04/15/2017  . Long term current use of opiate analgesic 04/15/2017  . Long term prescription benzodiazepine use 04/15/2017  . Pharmacologic therapy 04/15/2017  . Disorder of skeletal system 04/15/2017  . Problems influencing health status 04/15/2017  . Chronic shoulder pain [facility] Area of Pain) (Bilateral) (R>L) 04/15/2017  . Chronic hip pain (Fourth Area of Pain) (Bilateral) (R>L) 04/15/2017  . Cervical spondylosis without myelopathy 02/11/2017  . Fibromyalgia affecting multiple sites (Secondary Area of Pain) 02/11/2017  . Spondylosis of lumbar spine 02/11/2017  . Medication management 08/30/2014  . Vitamin D deficiency 08/30/2014  . S/P gastric bypass 08/30/2014  . Prediabetes 01/01/2014  . Insomnia 01/01/2014  . Morbid obesity (HCC) 06/15/2011  . Tobacco abuse 10/11/2010  . Anxiety state 05/12/2008  . Essential hypertension 05/12/2008     Jearld Lesch., PT 08/13/2017, 6:00 PM  [facility] [place] [address] [place], [place], [address] Phone: [phone]   Fax:  [phone]  Name: Wendy Valencia MRN: [id number] Date of Birth: April 07, 1965

## 2017-08-21 ENCOUNTER — Other Ambulatory Visit: Payer: Self-pay | Admitting: Physician Assistant

## 2017-08-21 DIAGNOSIS — M797 Fibromyalgia: Secondary | ICD-10-CM

## 2017-08-24 ENCOUNTER — Other Ambulatory Visit: Payer: Self-pay | Admitting: Adult Health

## 2017-08-26 ENCOUNTER — Other Ambulatory Visit: Payer: Self-pay | Admitting: Internal Medicine

## 2017-08-26 DIAGNOSIS — G4700 Insomnia, unspecified: Secondary | ICD-10-CM

## 2017-08-27 ENCOUNTER — Ambulatory Visit: Payer: Managed Care, Other (non HMO) | Admitting: Physical Therapy

## 2017-08-29 NOTE — Progress Notes (Signed)
Complete Physical  Assessment and Plan:  Essential hypertension - continue medications, DASH diet, exercise and monitor at home. Call if greater than 130/80.  -     CBC with Differential/Platelet -     COMPLETE METABOLIC PANEL WITH GFR -     TSH -     Urinalysis, Routine w reflex microscopic -     Microalbumin / creatinine urine ratio  Fibromyalgia affecting multiple sites (Secondary Area of Pain)  Continue follow up pain management  Chronic pain syndrome Continue follow up pain management  Long term current use of opiate analgesic Continue follow up pain management  Long term prescription benzodiazepine use Continue follow up pain management  Tobacco abuse Discussed stopping  Anxiety state Continue meds  Other insomnia Insomnia- good sleep hygiene discussed, increase day time activity  Prediabetes Discussed disease progression and risks Discussed diet/exercise, weight management and risk modification -     Hemoglobin A1c  Medication management -     Magnesium  Vitamin D deficiency -     VITAMIN D 25 Hydroxy (Vit-D Deficiency, Fractures)  S/P gastric bypass -     Iron,Total/Total Iron Binding Cap -     Folate RBC -     Vitamin B12  BMI 35.0-35.9,adult - - follow up 3 months for progress monitoring - long discussion about weight loss, diet, and exercise, will start the patient on phentermine- hand out given and AE's discussed, will do close follow up.  -     phentermine (ADIPEX-P) 37.5 MG tablet; Take 1 tablet (37.5 mg total) by mouth daily before breakfast.  Mixed hyperlipidemia check lipids decrease fatty foods increase activity.  -     Lipid panel  Screening, anemia, deficiency, iron -     Iron,Total/Total Iron Binding Cap -     Folate RBC -     Vitamin B12  Post-menopausal bleeding -     US PELVIC COMPLETE WITH TRANSVAGINAL; Future - will get imaging and refer to GYN      Discussed med's effects and SE's. Screening labs and tests as  requested with regular follow-up as recommended. Over 40 minutes of exam, counseling, chart review, and complex, high level critical decision making was performed this visit.   HPI  52 y.o. female  presents for a complete physical.  Her blood pressure has been controlled at home, she is on ACE, today their BP is BP: 132/78 She does workout, she is walking. She denies chest pain, shortness of breath, dizziness.  She does smoke.  CXR 06/2017 She has chronic pain, DJD and follows with pain clinic, Dr. Lowella Dandy.  She takes trazodone and doxepin at night for sleep.   She is not on cholesterol medication and denies myalgias. Her cholesterol is at goal. The cholesterol last visit was:   Lab Results  Component Value Date   CHOL 133 03/04/2017   HDL 58 03/04/2017   LDLCALC 59 03/04/2017   TRIG 77 03/04/2017   CHOLHDL 2.3 03/04/2017   She has been working on diet and exercise for prediabetes, and denies paresthesia of the feet, polydipsia, polyuria and visual disturbances. Last A1C in the office was:  Lab Results  Component Value Date   HGBA1C 5.8 (H) 03/04/2017   Patient is on Vitamin D supplement.   Lab Results  Component Value Date   VD25OH 60 03/04/2017     BMI is Body mass index is 35.12 kg/m., she is working on diet and exercise s/p gastric bypass Sept 2013. She has gained  weight with stress and prednisone shots she states she is increased hunger and she is requesting  Wt Readings from Last 3 Encounters:  09/02/17 231 lb (104.8 kg)  07/08/17 213 lb (96.6 kg)  06/25/17 215 lb (97.5 kg)   She has had elevated alk phos with normal Ab [place], GGT and bone scan in 2001. Has had negative autoimmune workup in the past.  She had a normal DEXA 09/2013, has stopped depo. She is off depo, has not had menses x 1 year had 3 days of bleeding, heavy enough she had to use a tampon.She has AB bloating.       Current Medications:  Current Outpatient Medications on File Prior to Visit  Medication  Sig Dispense Refill  . Albuterol Sulfate (PROVENTIL HFA IN) Inhale 108 mcg into the lungs as needed.    . Aspirin-Acetaminophen-Caffeine (EXCEDRIN EXTRA STRENGTH PO) Take 1 tablet by mouth 3 (three) times daily as needed (headache).     Marland Kitchen augmented betamethasone dipropionate (DIPROLENE-AF) 0.05 % ointment Apply topically daily. (Patient taking differently: Apply 1 application topically 3 (three) times daily as needed. ) 50 g 3  . cetirizine-pseudoephedrine (ZYRTEC-D) 5-120 MG tablet Take 1 tablet by mouth every 12 (twelve) hours as needed for allergies.     . Chorionic Gonadotropin (HCG IJ) Inject 2 mg as directed 2 (two) times a week.    . diphenhydrAMINE (BENADRYL) 25 MG tablet Take 25-50 mg by mouth 3 (three) times daily as needed for allergies.    Marland Kitchen doxepin (SINEQUAN) 25 MG capsule TAKE ONE CAPSULE BY MOUTH TWO HOURS BEFORE BEDTIME AND MAY REPEAT AT HOUR OF SLEEP 90 capsule 0  . DULoxetine (CYMBALTA) 60 MG capsule TAKE ONE CAPSULE BY MOUTH DAILY 90 capsule 1  . ferrous sulfate 325 (65 FE) MG EC tablet Take 325 mg by mouth at bedtime.     . gabapentin (NEURONTIN) 300 MG capsule Take 1 capsule 3 x /day as needed for pain 90 capsule 0  . Ginkgo Biloba (GINKOBA PO) Take 1 capsule by mouth daily.     . Glucosamine-Chondroitin (COSAMIN DS PO) Take 1,500 mg by mouth daily.    Marland Kitchen lisinopril-hydrochlorothiazide (PRINZIDE,ZESTORETIC) 20-12.5 MG tablet TAKE 1 TABLET BY MOUTH EVERY DAY 30 tablet 0  . Magnesium 500 MG TABS Take 500 mg by mouth 2 (two) times daily.     . metoCLOPramide (REGLAN) 10 MG tablet TAKE ONE TABLET BY MOUTH ONCE DAILY (Patient taking differently: TAKE ONE TABLET BY MOUTH ONCE DAILY as needed heartburn) 90 tablet 0  . Multiple Vitamins-Minerals (MULTIVITAMIN WITH MINERALS) tablet Take 1 tablet by mouth daily. .    . naproxen (NAPROSYN) 500 MG tablet Take 1 tablet (500 mg total) by mouth 2 (two) times daily. 30 tablet 0  . Spacer/Aero-Holding Chambers (AEROCHAMBER PLUS WITH MASK)  inhaler Use as instructed 1 each 2  . TURMERIC PO Take 1,000 mg by mouth daily.    . Vitamin D, Ergocalciferol, (DRISDOL) 50000 units CAPS capsule TAKE ONE CAPSULE BY MOUTH TWICE WEEKLY 30 capsule 0   No current facility-administered medications on file prior to visit.    Health Maintenance:   Immunization History  Administered Date(s) Administered  . Influenza Inj Mdck Quad With Preservative 10/23/2016  . Influenza Split 10/02/2011, 10/16/2013, 11/15/2014  . Pneumococcal Polysaccharide-23 10/02/2011  . Tdap 08/30/2016   Tetanus: 2018 Pneumovax: 2013 Prevnar 13: N/A Flu vaccine: 2018 Zostavax: N/A  LMP: She is off depo, has not had menses x 1 year had 3 days  of bleeding.  Pap:08/2016 NEG HPV MGM:  03/2016 DEXA: 09/2013  Colonoscopy: 04/2017 normal EGD: 2013 Myoview stress test 10/2010  Last Dental Exam: Dr. Quincy Simmonds Last Eye Exam: Dr Randall Hiss DERM Dr. Raquel Sarna, OB/GYN Patient Care Team: Unk Pinto, MD as PCP - General (Internal Medicine) Himmelrich, Bryson Ha, RD (Inactive) as Dietitian Tia Masker) Ladene Artist, MD as Consulting Physician (Gastroenterology)  Medical History:  Past Medical History:  Diagnosis Date  . Allergy   . Anemia   . Anxiety   . Arthritis   . Fibromyalgia   . GERD (gastroesophageal reflux disease)   . Hypertension   . IBS (irritable bowel syndrome)   . Nephrolithiasis   . Plantar fasciitis of right foot    wears boot    Allergies No Known Allergies  SURGICAL HISTORY She  has a past surgical history that includes Left shoulder arthroscopic surgery (yrs ago); tummy tuck (2005); Breath tek h pylori (07/11/2011); Gastric Roux-En-Y (10/01/2011); Cholecystectomy (10/01/2011); and Colonoscopy.   FAMILY HISTORY Her family history includes Breast cancer (age of onset: 4) in her mother; Breast cancer (age of onset: 22) in her maternal aunt; Cancer in her maternal aunt and maternal grandmother; Cancer (age of onset: 67) in her mother; Heart  attack in her maternal grandfather and paternal grandfather; Heart disease in her father and maternal grandfather; Hypertension in her father; Lung cancer in her maternal grandmother; Multiple sclerosis in her maternal uncle; Obesity in her brother; Stroke in her father.   SOCIAL HISTORY She  reports that she has been smoking cigarettes. She has a 1.75 pack-year smoking history. She has never used smokeless tobacco. She reports that she drinks about 1.0 standard drinks of alcohol per week. She reports that she does not use drugs.   Review of Systems: Review of Systems  Constitutional: Positive for malaise/fatigue. Negative for chills, diaphoresis, fever and weight loss.  Eyes: Negative.   Respiratory: Negative.   Cardiovascular: Negative.   Gastrointestinal: Negative.   Genitourinary: Negative.   Musculoskeletal: Positive for back pain, joint pain, myalgias and neck pain. Negative for falls.  Skin: Negative.   Neurological: Negative.  Negative for weakness.  Psychiatric/Behavioral: Negative.     Physical Exam: Estimated body mass index is 35.12 kg/m as calculated from the following:   Height as of this encounter: '5\' 8"'  (1.727 m).   Weight as of this encounter: 231 lb (104.8 kg). BP 132/78   Pulse 86   Temp (!) 97.5 F (36.4 C)   Resp 16   Ht '5\' 8"'  (1.727 m)   Wt 231 lb (104.8 kg)   SpO2 98%   BMI 35.12 kg/m  General Appearance: Well nourished, in no apparent distress.  Eyes: PERRLA, EOMs, conjunctiva no swelling or erythema, normal fundi and vessels.  Sinuses: No Frontal/maxillary tenderness  ENT/Mouth: Ext aud canals clear, normal light reflex with TMs without erythema, bulging. Good dentition. No erythema, swelling, or exudate on post pharynx. Tonsils not swollen or erythematous. Hearing normal.  Neck: Supple, thyroid normal. No bruits  Respiratory: Respiratory effort normal, BS equal bilaterally without rales, rhonchi, wheezing or stridor.  Cardio: RRR without murmurs,  rubs or gallops. Brisk peripheral pulses without edema.  Chest: symmetric, with normal excursions and percussion.  Breasts: defer  Abdomen: Soft, nontender, no guarding, rebound, hernias, masses, or organomegaly.  Lymphatics: Non tender without lymphadenopathy.  Genitourinary: defer Musculoskeletal: Full ROM all peripheral extremities,5/5 strength, and normal gait.  Skin: Warm, dry without rashes, lesions, ecchymosis. Neuro: Cranial nerves intact, reflexes equal  bilaterally. Normal muscle tone, no cerebellar symptoms. Sensation intact.  Psych: Awake and oriented X 3, normal affect, Insight and Judgment appropriate.   EKG: defer had in June 2019 AORTA SCAN: defer   Vicie Mutters 9:10 AM [facility]

## 2017-09-02 ENCOUNTER — Ambulatory Visit (INDEPENDENT_AMBULATORY_CARE_PROVIDER_SITE_OTHER): Payer: Managed Care, Other (non HMO) | Admitting: Physician Assistant

## 2017-09-02 ENCOUNTER — Encounter: Payer: Self-pay | Admitting: Physician Assistant

## 2017-09-02 VITALS — BP 132/78 | HR 86 | Temp 97.5°F | Resp 16 | Ht 68.0 in | Wt 231.0 lb

## 2017-09-02 DIAGNOSIS — Z130 Encounter for screening for diseases of the blood and blood-forming organs and certain disorders involving the immune mechanism: Secondary | ICD-10-CM

## 2017-09-02 DIAGNOSIS — R7303 Prediabetes: Secondary | ICD-10-CM

## 2017-09-02 DIAGNOSIS — Z79899 Other long term (current) drug therapy: Secondary | ICD-10-CM

## 2017-09-02 DIAGNOSIS — F411 Generalized anxiety disorder: Secondary | ICD-10-CM

## 2017-09-02 DIAGNOSIS — Z9884 Bariatric surgery status: Secondary | ICD-10-CM

## 2017-09-02 DIAGNOSIS — I10 Essential (primary) hypertension: Secondary | ICD-10-CM

## 2017-09-02 DIAGNOSIS — Z720 Tobacco use: Secondary | ICD-10-CM

## 2017-09-02 DIAGNOSIS — Z1329 Encounter for screening for other suspected endocrine disorder: Secondary | ICD-10-CM

## 2017-09-02 DIAGNOSIS — Z0000 Encounter for general adult medical examination without abnormal findings: Secondary | ICD-10-CM

## 2017-09-02 DIAGNOSIS — E559 Vitamin D deficiency, unspecified: Secondary | ICD-10-CM

## 2017-09-02 DIAGNOSIS — M797 Fibromyalgia: Secondary | ICD-10-CM

## 2017-09-02 DIAGNOSIS — Z131 Encounter for screening for diabetes mellitus: Secondary | ICD-10-CM

## 2017-09-02 DIAGNOSIS — Z6835 Body mass index (BMI) 35.0-35.9, adult: Secondary | ICD-10-CM

## 2017-09-02 DIAGNOSIS — G4709 Other insomnia: Secondary | ICD-10-CM

## 2017-09-02 DIAGNOSIS — G894 Chronic pain syndrome: Secondary | ICD-10-CM

## 2017-09-02 DIAGNOSIS — Z1389 Encounter for screening for other disorder: Secondary | ICD-10-CM

## 2017-09-02 DIAGNOSIS — N950 Postmenopausal bleeding: Secondary | ICD-10-CM

## 2017-09-02 DIAGNOSIS — Z79891 Long term (current) use of opiate analgesic: Secondary | ICD-10-CM

## 2017-09-02 DIAGNOSIS — Z13220 Encounter for screening for lipoid disorders: Secondary | ICD-10-CM

## 2017-09-02 DIAGNOSIS — E782 Mixed hyperlipidemia: Secondary | ICD-10-CM

## 2017-09-02 MED ORDER — PHENTERMINE HCL 37.5 MG PO TABS: 37.5000 mg | ORAL_TABLET | Freq: Every day | ORAL | 2 refills | Status: DC

## 2017-09-02 NOTE — Patient Instructions (Addendum)
Check out  Mini habits for weight loss book  2 apps for tracking food is myfitness pal  loseit OR can take picture of your food  We have made a referral for you to get imaging or go to another doctor. We will try to get this as soon as possible but please understand that we often need to get approval with your insurance before we can schedule and not every office can accommodate you quickly. So please allow 5-7 business days for us to call you back about the referral.   Diet soda leads to weight gain.  We recently discovered that the artifical sugar in the soda stops an enzyme in your stomach that is suppose to signal that your brain is full. So patients that drink a lot of diet soda will never feel full and tend to over eat. So please cut back on diet soda and it can help with your weight loss.   Nuts are a healthy fat that can help you feel full HOWEVER they pack a lot of calories in a small amount. For weight loss, I suggest no more than 1 serving of nuts a day. A handful goes a long way. Here are some examples of the different types of nuts and the suggested serving a day.     Phentermine  While taking the medication we may ask that you come into the office once a month for the first month for a blood pressure check and EKG.   Also please bring in a food log for that visit to review. It is helpful if you bring in a food diary or use an app on your phone such as myfitnesspal to record your calorie intake, especially in the beginning. BRING FOR YOUR FIRST VISIT.  After that first initial visit, we will want to see you once every 2-3 months to monitor your weight, blood pressure, and heart rate.   In addition we can help answer your questions about diet, exercise, and help you every step of the way with your weight loss journey.  You can start out on 1/3 to 1/2 a pill in the morning and if you are tolerating it well you can increase to one pill daily. I also have some patients that take 1/3  or 1/2 at lunch to help prevent night time eating.  This medication is [facility] [employer] OR [employer] is 14-17 dollars and you do NOT need a membership to get meds from there.   It causes dry mouth and constipation in almost every patient, so try to get 80-100 oz of water a day and increase fiber such as veggies. You can add on a stool softener if you would like.   It can give you energy however it can also cause some people to be shaky, anxious or have palpitations. Stop this medication if that happens and contact the office.   If this medication does not work for you there are several medications that we can try to help rewire your brain in addition to making healthier habits.   What is this medicine? PHENTERMINE (FEN ter meen) decreases your appetite. This medicine is intended to be used in addition to a healthy reduced calorie diet and exercise. The best results are achieved this way. This medicine is only indicated for short-term use. Eventually your weight loss may level out and the medication will no longer be needed.   How should I use this medicine? Take this medicine by  mouth. Follow the directions on the prescription label. The tablets should stay in the bottle until immediately before you take your dose. Take your doses at regular intervals. Do not take your medicine more often than directed.  Overdosage: If you think you have taken too much of this medicine contact a poison control center or emergency room at once. NOTE: This medicine is only for you. Do not share this medicine with others.  What if I miss a dose? If you miss a dose, take it as soon as you can. If it is almost time for your next dose, take only that dose. Do not take double or extra doses. Do not increase or in any way change your dose without consulting your doctor.  What should I watch for while using this medicine? Notify your physician immediately if you become short of breath while doing  your normal activities. Do not take this medicine within 6 hours of bedtime. It can keep you from getting to sleep. Avoid drinks that contain caffeine and try to stick to a regular bedtime every night. Do not stand or sit up quickly, especially if you are an older patient. This reduces the risk of dizzy or fainting spells. Avoid alcoholic drinks.  What side effects may I notice from receiving this medicine? Side effects that you should report to your doctor or health care professional as soon as possible: -chest pain, palpitations -depression or severe changes in mood -increased blood pressure -irritability -nervousness or restlessness -severe dizziness -shortness of breath -problems urinating -unusual swelling of the legs -vomiting  Side effects that usually do not require medical attention (report to your doctor or health care professional if they continue or are bothersome): -blurred vision or other eye problems -changes in sexual ability or desire -constipation or diarrhea -difficulty sleeping -dry mouth or unpleasant taste -headache -nausea This list may not describe all possible side effects. Call your doctor for medical advice about side effects. You may report side effects to FDA at 1-800-FDA-1088.  Veggies are great because you can eat a ton! They are low in calories, great to fill you up, and have a ton of vitamins, minerals, and protein.

## 2017-09-03 LAB — IRON, TOTAL/TOTAL IRON BINDING CAP
%SAT: 12 % (calc) — ABNORMAL LOW (ref 16–45)
Iron: 51 ug/dL (ref 45–160)
TIBC: 441 ug/dL (ref 250–450)

## 2017-09-03 LAB — COMPLETE METABOLIC PANEL WITH GFR
AG Ratio: 1.8 (calc) (ref 1.0–2.5)
ALBUMIN MSPROF: 4.4 g/dL (ref 3.6–5.1)
ALKALINE PHOSPHATASE (APISO): 161 U/L — AB (ref 33–130)
ALT: 46 U/L — ABNORMAL HIGH (ref 6–29)
AST: 32 U/L (ref 10–35)
BUN: 11 mg/dL (ref 7–25)
CO2: 28 mmol/L (ref 20–32)
Calcium: 9.4 mg/dL (ref 8.6–10.4)
Chloride: 103 mmol/L (ref 98–110)
Creat: 0.78 mg/dL (ref 0.50–1.05)
GFR, Est African American: 102 mL/min/{1.73_m2} (ref >=60)
GFR, Est Non African American: 88 mL/min/{1.73_m2} (ref >=60)
GLOBULIN: 2.4 g/dL (ref 1.9–3.7)
Glucose, Bld: 92 mg/dL (ref 65–99)
POTASSIUM: 3.8 mmol/L (ref 3.5–5.3)
SODIUM: 140 mmol/L (ref 135–146)
Total Bilirubin: 0.4 mg/dL (ref 0.2–1.2)
Total Protein: 6.8 g/dL (ref 6.1–8.1)

## 2017-09-03 LAB — VITAMIN B12: VITAMIN B 12: 693 pg/mL (ref 200–1100)

## 2017-09-03 LAB — URINALYSIS, ROUTINE W REFLEX MICROSCOPIC
BILIRUBIN URINE: NEGATIVE
Glucose, UA: NEGATIVE
Hgb urine dipstick: NEGATIVE
KETONES UR: NEGATIVE
Leukocytes, UA: NEGATIVE
Nitrite: NEGATIVE
Protein, ur: NEGATIVE
SPECIFIC GRAVITY, URINE: 1.016 (ref 1.001–1.03)
pH: <=5 (ref 5.0–8.0)

## 2017-09-03 LAB — MAGNESIUM: MAGNESIUM: 2 mg/dL (ref 1.5–2.5)

## 2017-09-03 LAB — CBC WITH DIFFERENTIAL/PLATELET
BASOS ABS: 28 {cells}/uL (ref 0–200)
Basophils Relative: 0.5 %
EOS ABS: 78 {cells}/uL (ref 15–500)
Eosinophils Relative: 1.4 %
HCT: 36.3 % (ref 35.0–45.0)
HEMOGLOBIN: 11.6 g/dL — AB (ref 11.7–15.5)
Lymphs Abs: 1473 cells/uL (ref 850–3900)
MCH: 26.2 pg — AB (ref 27.0–33.0)
MCHC: 32 g/dL (ref 32.0–36.0)
MCV: 81.9 fL (ref 80.0–100.0)
MPV: 9.7 fL (ref 7.5–12.5)
Monocytes Relative: 11.5 %
Neutro Abs: 3377 cells/uL (ref 1500–7800)
Neutrophils Relative %: 60.3 %
PLATELETS: 347 10*3/uL (ref 140–400)
RBC: 4.43 10*6/uL (ref 3.80–5.10)
RDW: 14 % (ref 11.0–15.0)
TOTAL LYMPHOCYTE: 26.3 %
WBC: 5.6 10*3/uL (ref 3.8–10.8)
WBCMIX: 644 {cells}/uL (ref 200–950)

## 2017-09-03 LAB — LIPID PANEL
CHOLESTEROL: 173 mg/dL (ref <200)
HDL: 80 mg/dL (ref >50)
LDL Cholesterol (Calc): 77 mg/dL (calc)
Non-HDL Cholesterol (Calc): 93 mg/dL (calc) (ref <130)
Total CHOL/HDL Ratio: 2.2 (calc) (ref <5.0)
Triglycerides: 80 mg/dL (ref <150)

## 2017-09-03 LAB — HEMOGLOBIN A1C
EAG (MMOL/L): 6.8 (calc)
Hgb A1c MFr Bld: 5.9 % of total Hgb — ABNORMAL HIGH (ref <5.7)
MEAN PLASMA GLUCOSE: 123 (calc)

## 2017-09-03 LAB — FOLATE RBC: RBC Folate: 1019 ng/mL RBC (ref >280)

## 2017-09-03 LAB — MICROALBUMIN / CREATININE URINE RATIO
CREATININE, URINE: 76 mg/dL (ref 20–275)
Microalb Creat Ratio: 3 mcg/mg creat (ref <30)
Microalb, Ur: 0.2 mg/dL

## 2017-09-03 LAB — VITAMIN D 25 HYDROXY (VIT D DEFICIENCY, FRACTURES): VIT D 25 HYDROXY: 34 ng/mL (ref 30–100)

## 2017-09-03 LAB — TSH: TSH: 1.2 mIU/L

## 2017-09-05 ENCOUNTER — Ambulatory Visit: Payer: Managed Care, Other (non HMO) | Attending: Pain Medicine | Admitting: Physical Therapy

## 2017-09-05 ENCOUNTER — Encounter: Payer: Self-pay | Admitting: Physical Therapy

## 2017-09-05 DIAGNOSIS — M25551 Pain in right hip: Secondary | ICD-10-CM

## 2017-09-05 DIAGNOSIS — R262 Difficulty in walking, not elsewhere classified: Secondary | ICD-10-CM | POA: Diagnosis present

## 2017-09-05 NOTE — Therapy (Addendum)
[place], [place], [address] Phone: [phone]   Fax:  [phone]  Physical Therapy Treatment  Patient Details  Name: Wendy Valencia MRN: [id number] Date of Birth: [birth year] Referring Provider: Oda Kilts   Encounter Date: 09/05/2017  PT End of Session - 09/05/17 1742    Visit Number  2    Date for PT Re-Evaluation  10/14/17    PT Start Time  1829    PT Stop Time  9371    PT Time Calculation (min)  60 min       Past Medical History:  Diagnosis Date  . Allergy   . Anemia   . Anxiety   . Arthritis   . Fibromyalgia   . GERD (gastroesophageal reflux disease)   . Hypertension   . IBS (irritable bowel syndrome)   . Nephrolithiasis   . Plantar fasciitis of right foot    wears boot     Past Surgical History:  Procedure Laterality Date  . BREATH TEK H PYLORI  07/11/2011   Procedure: BREATH TEK H PYLORI;  Surgeon: Edward Jolly, MD;  Location: [facility];  Service: General;  Laterality: N/A;  . CHOLECYSTECTOMY  10/01/2011   Procedure: LAPAROSCOPIC CHOLECYSTECTOMY WITH INTRAOPERATIVE CHOLANGIOGRAM;  Surgeon: Edward Jolly, MD;  Location: [facility];  Service: General;  Laterality: N/A;  . COLONOSCOPY    . GASTRIC ROUX-EN-Y  10/01/2011   Procedure: LAPAROSCOPIC ROUX-EN-Y GASTRIC;  Surgeon: Edward Jolly, MD;  Location: [facility];  Service: General;  Laterality: N/A;  . Left shoulder arthroscopic surgery  yrs ago  . tummy tuck  2005    There were no vitals filed for this visit.  Subjective Assessment - 09/05/17 1700    Subjective  doing stretches at home some. rain is killing me . ordered TENS    Currently in Pain?  Yes    Pain Score  6     Pain Location  Hip    Pain Orientation  Right                       [facility] Adult PT Treatment/Exercise - 09/05/17 0001      Exercises   Exercises  Lumbar;Knee/Hip      Knee/Hip Exercises: Aerobic   Nustep  L 4 5 min       Knee/Hip Exercises: Standing   Other Standing Knee Exercises  hip 3 way red tband 10 reps each      Knee/Hip Exercises: Seated   Ball Squeeze  15 3 sec hold    Clamshell with TheraBand  Red   2 sets 10     Knee/Hip Exercises: Supine   Bridges  Strengthening;10 reps   3 sec with ball   Straight Leg Raises  Both;10 reps   red tband with abd   Other Supine Knee/Hip Exercises  obl with ball 20      Modalities   Modalities  Electrical Stimulation;Moist Heat      Moist Heat Therapy   Number Minutes Moist Heat  15 Minutes    Moist Heat Location  Hip      Electrical Stimulation   Electrical Stimulation Location  BIL GT and buttock    Electrical Stimulation Action  IFC    Electrical Stimulation Parameters  supine    Electrical Stimulation Goals  Pain      Manual Therapy   Manual Therapy  Soft tissue mobilization;Myofascial release;Passive ROM  Soft tissue mobilization  gluts, hips, ITB    Myofascial Release  gluts,hips, ITB     Passive ROM  LE and trunk               PT Short Term Goals - 08/13/17 1757      PT SHORT TERM GOAL #1   Title  independent with initial HEP    Time  2    Period  Weeks    Status  New        PT Long Term Goals - 08/13/17 1757      PT LONG TERM GOAL #1   Title  understand posture and ergonomics set up     Time  8    Period  Weeks    Status  New      PT LONG TERM GOAL #2   Title  gym activities safely    Time  8    Period  Weeks    Status  New      PT LONG TERM GOAL #3   Title  report pain decreased 25%    Time  8    Period  Weeks    Status  New      PT LONG TERM GOAL #4   Title  increase right hip strength to 4+/5    Time  8    Period  Weeks    Status  New            Plan - 09/05/17 1742    Clinical Impression Statement  very tender with STW but tolerated and PROM was fairly good. tolerated initail ex progression well. pt difficulty with relaxing.    PT Treatment/Interventions  ADLs/Self Care Home  Management;Cryotherapy;Electrical Stimulation;Iontophoresis 24m/ml Dexamethasone;Moist Heat;Therapeutic activities;Therapeutic exercise;Balance training;Neuromuscular re-education;Patient/family education;Manual techniques    PT Next Visit Plan  try stretching and gym activity, she may try to get her own TENS       Patient will benefit from skilled therapeutic intervention in order to improve the following deficits and impairments:  Abnormal gait, Decreased range of motion, Difficulty walking, Increased muscle spasms, Pain, Impaired flexibility, Improper body mechanics, Postural dysfunction, Decreased strength, Decreased mobility  Visit Diagnosis: Pain in right hip  Difficulty in walking, not elsewhere classified     Problem List Patient Active Problem List   Diagnosis Date Noted  . Greater trochanteric bursitis of hips (Bilateral) 06/24/2017  . Osteoarthritis of hips (Bilateral) 06/24/2017  . Chronic generalized pain (Primary Area of Pain) 04/15/2017  . Chronic pain syndrome 04/15/2017  . Long term current use of opiate analgesic 04/15/2017  . Long term prescription benzodiazepine use 04/15/2017  . Pharmacologic therapy 04/15/2017  . Disorder of skeletal system 04/15/2017  . Problems influencing health status 04/15/2017  . [facility] ([facility]) (R>L) 04/15/2017  . Chronic hip pain (Fourth Area of Pain) (Bilateral) (R>L) 04/15/2017  . Cervical spondylosis without myelopathy 02/11/2017  . Fibromyalgia affecting multiple sites (Secondary Area of Pain) 02/11/2017  . Spondylosis of lumbar spine 02/11/2017  . Medication management 08/30/2014  . Vitamin D deficiency 08/30/2014  . S/P gastric bypass 08/30/2014  . Prediabetes 01/01/2014  . Insomnia 01/01/2014  . Tobacco abuse 10/11/2010  . Anxiety state 05/12/2008  . Essential hypertension 05/12/2008   PHYSICAL THERAPY DISCHARGE SUMMARY  Visits from Start of Care:    Plan: Patient agrees to  discharge.  Patient goals were not met. Patient is being discharged due to not returning since the last visit.  ?????  PAYSEUR,ANGIE  PTA 09/05/2017, 5:44 PM  [place], [place], [address] Phone: [phone]   Fax:  [phone]  Name: TYRIHANNA WINGERT MRN: [id number] Date of Birth: Jan 03, 1966

## 2017-09-06 ENCOUNTER — Other Ambulatory Visit: Payer: Self-pay | Admitting: Adult Health

## 2017-09-18 ENCOUNTER — Encounter: Payer: Managed Care, Other (non HMO) | Admitting: Nurse Practitioner

## 2017-09-20 ENCOUNTER — Ambulatory Visit
Admission: RE | Admit: 2017-09-20 | Discharge: 2017-09-20 | Disposition: A | Discharge status: Home / Self Care | Payer: Managed Care, Other (non HMO) | Source: Ambulatory Visit | Attending: Physician Assistant | Admitting: Physician Assistant

## 2017-09-20 DIAGNOSIS — N950 Postmenopausal bleeding: Secondary | ICD-10-CM

## 2017-09-22 ENCOUNTER — Other Ambulatory Visit: Payer: Self-pay | Admitting: Physician Assistant

## 2017-09-22 DIAGNOSIS — R9389 Abnormal findings on diagnostic imaging of other specified body structures: Secondary | ICD-10-CM

## 2017-09-22 DIAGNOSIS — N83209 Unspecified ovarian cyst, unspecified side: Secondary | ICD-10-CM

## 2017-09-22 DIAGNOSIS — N950 Postmenopausal bleeding: Secondary | ICD-10-CM

## 2017-09-23 ENCOUNTER — Other Ambulatory Visit: Payer: Self-pay | Admitting: Internal Medicine

## 2017-09-24 ENCOUNTER — Other Ambulatory Visit: Payer: Self-pay | Admitting: Internal Medicine

## 2017-09-24 DIAGNOSIS — M797 Fibromyalgia: Secondary | ICD-10-CM

## 2017-09-25 ENCOUNTER — Encounter: Payer: Self-pay | Admitting: Nurse Practitioner

## 2017-09-25 ENCOUNTER — Ambulatory Visit: Payer: Managed Care, Other (non HMO) | Attending: Nurse Practitioner | Admitting: Nurse Practitioner

## 2017-09-25 VITALS — BP 134/94 | HR 97 | Temp 99.1°F | Resp 16 | Ht 68.0 in | Wt 232.0 lb

## 2017-09-25 DIAGNOSIS — Z7982 Long term (current) use of aspirin: Secondary | ICD-10-CM | POA: Diagnosis not present

## 2017-09-25 DIAGNOSIS — Z9884 Bariatric surgery status: Secondary | ICD-10-CM | POA: Insufficient documentation

## 2017-09-25 DIAGNOSIS — Z79899 Other long term (current) drug therapy: Secondary | ICD-10-CM | POA: Insufficient documentation

## 2017-09-25 DIAGNOSIS — G894 Chronic pain syndrome: Secondary | ICD-10-CM

## 2017-09-25 DIAGNOSIS — I10 Essential (primary) hypertension: Secondary | ICD-10-CM | POA: Insufficient documentation

## 2017-09-25 DIAGNOSIS — R7303 Prediabetes: Secondary | ICD-10-CM | POA: Insufficient documentation

## 2017-09-25 DIAGNOSIS — M797 Fibromyalgia: Secondary | ICD-10-CM | POA: Diagnosis not present

## 2017-09-25 DIAGNOSIS — Z9889 Other specified postprocedural states: Secondary | ICD-10-CM | POA: Insufficient documentation

## 2017-09-25 DIAGNOSIS — K219 Gastro-esophageal reflux disease without esophagitis: Secondary | ICD-10-CM | POA: Insufficient documentation

## 2017-09-25 DIAGNOSIS — G4700 Insomnia, unspecified: Secondary | ICD-10-CM | POA: Diagnosis not present

## 2017-09-25 DIAGNOSIS — M47812 Spondylosis without myelopathy or radiculopathy, cervical region: Secondary | ICD-10-CM | POA: Insufficient documentation

## 2017-09-25 DIAGNOSIS — Z801 Family history of malignant neoplasm of trachea, bronchus and lung: Secondary | ICD-10-CM | POA: Insufficient documentation

## 2017-09-25 DIAGNOSIS — R9389 Abnormal findings on diagnostic imaging of other specified body structures: Secondary | ICD-10-CM | POA: Diagnosis present

## 2017-09-25 DIAGNOSIS — M25551 Pain in right hip: Secondary | ICD-10-CM

## 2017-09-25 DIAGNOSIS — M722 Plantar fascial fibromatosis: Secondary | ICD-10-CM | POA: Insufficient documentation

## 2017-09-25 DIAGNOSIS — Z8249 Family history of ischemic heart disease and other diseases of the circulatory system: Secondary | ICD-10-CM | POA: Insufficient documentation

## 2017-09-25 DIAGNOSIS — D649 Anemia, unspecified: Secondary | ICD-10-CM | POA: Diagnosis not present

## 2017-09-25 DIAGNOSIS — Z823 Family history of stroke: Secondary | ICD-10-CM | POA: Insufficient documentation

## 2017-09-25 DIAGNOSIS — N950 Postmenopausal bleeding: Secondary | ICD-10-CM | POA: Insufficient documentation

## 2017-09-25 DIAGNOSIS — N83202 Unspecified ovarian cyst, left side: Secondary | ICD-10-CM | POA: Diagnosis not present

## 2017-09-25 DIAGNOSIS — Z791 Long term (current) use of non-steroidal anti-inflammatories (NSAID): Secondary | ICD-10-CM | POA: Insufficient documentation

## 2017-09-25 DIAGNOSIS — Z79891 Long term (current) use of opiate analgesic: Secondary | ICD-10-CM | POA: Diagnosis not present

## 2017-09-25 DIAGNOSIS — M160 Bilateral primary osteoarthritis of hip: Secondary | ICD-10-CM

## 2017-09-25 DIAGNOSIS — K589 Irritable bowel syndrome without diarrhea: Secondary | ICD-10-CM | POA: Diagnosis not present

## 2017-09-25 DIAGNOSIS — G8929 Other chronic pain: Secondary | ICD-10-CM

## 2017-09-25 DIAGNOSIS — F329 Major depressive disorder, single episode, unspecified: Secondary | ICD-10-CM | POA: Insufficient documentation

## 2017-09-25 DIAGNOSIS — F17210 Nicotine dependence, cigarettes, uncomplicated: Secondary | ICD-10-CM | POA: Insufficient documentation

## 2017-09-25 DIAGNOSIS — M25552 Pain in left hip: Secondary | ICD-10-CM

## 2017-09-25 DIAGNOSIS — Z9049 Acquired absence of other specified parts of digestive tract: Secondary | ICD-10-CM | POA: Insufficient documentation

## 2017-09-25 DIAGNOSIS — E559 Vitamin D deficiency, unspecified: Secondary | ICD-10-CM | POA: Diagnosis not present

## 2017-09-25 MED ORDER — TRAMADOL HCL 50 MG PO TABS: 50.0000 mg | ORAL_TABLET | Freq: Four times a day (QID) | ORAL | 2 refills | Status: DC | PRN

## 2017-09-25 NOTE — Progress Notes (Signed)
Nursing Pain Medication Assessment:  Safety precautions to be maintained throughout the outpatient stay will include: orient to surroundings, keep bed in low position, maintain call bell within reach at all times, provide assistance with transfer out of bed and ambulation.  Medication Inspection Compliance: Pill count conducted under aseptic conditions, in front of the patient. Neither the pills nor the bottle was removed from the patient's sight at any time. Once count was completed pills were immediately returned to the patient in their original bottle.  Medication: Tramadol (Ultram) Pill/Patch Count: 12 of 120 pills remain Pill/Patch Appearance: Markings consistent with prescribed medication Bottle Appearance: Standard pharmacy container. Clearly labeled. Filled Date: 08 / 16 / 2019 Last Medication intake:  Today

## 2017-09-25 NOTE — Patient Instructions (Addendum)
____________________________________________________________________________________________  Medication Rules  Applies to: All patients receiving prescriptions (written or electronic).  Pharmacy of record: Pharmacy where electronic prescriptions will be sent. If written prescriptions are taken to a different pharmacy, please inform the nursing staff. The pharmacy listed in the electronic medical record should be the one where you would like electronic prescriptions to be sent.  Prescription refills: Only during scheduled appointments. Applies to both, written and electronic prescriptions.  NOTE: The following applies primarily to controlled substances (Opioid* Pain Medications).   Patient's responsibilities: 1. Pain Pills: Bring all pain pills to every appointment (except for procedure appointments). 2. Pill Bottles: Bring pills in original pharmacy bottle. Always bring newest bottle. Bring bottle, even if empty. 3. Medication refills: You are responsible for knowing and keeping track of what medications you need refilled. The day before your appointment, write a list of all prescriptions that need to be refilled. Bring that list to your appointment and give it to the admitting nurse. Prescriptions will be written only during appointments. If you forget a medication, it will not be "Called in", "Faxed", or "electronically sent". You will need to get another appointment to get these prescribed. 4. Prescription Accuracy: You are responsible for carefully inspecting your prescriptions before leaving our office. Have the discharge nurse carefully go over each prescription with you, before taking them home. Make sure that your name is accurately spelled, that your address is correct. Check the name and dose of your medication to make sure it is accurate. Check the number of pills, and the written instructions to make sure they are clear and accurate. Make sure that you are given enough medication to last  until your next medication refill appointment. 5. Taking Medication: Take medication as prescribed. Never take more pills than instructed. Never take medication more frequently than prescribed. Taking less pills or less frequently is permitted and encouraged, when it comes to controlled substances (written prescriptions).  6. Inform other Doctors: Always inform, all of your healthcare providers, of all the medications you take. 7. Pain Medication from other Providers: You are not allowed to accept any additional pain medication from any other Doctor or Healthcare provider. There are two exceptions to this rule. (see below) In the event that you require additional pain medication, you are responsible for notifying us, as stated below. 8. Medication Agreement: You are responsible for carefully reading and following our Medication Agreement. This must be signed before receiving any prescriptions from our practice. Safely store a copy of your signed Agreement. Violations to the Agreement will result in no further prescriptions. (Additional copies of our Medication Agreement are available upon request.) 9. Laws, Rules, & Regulations: All patients are expected to follow all Federal and State Laws, Statutes, Rules, & Regulations. Ignorance of the Laws does not constitute a valid excuse. The use of any illegal substances is prohibited. 10. Adopted CDC guidelines & recommendations: Target dosing levels will be at or below 60 MME/day. Use of benzodiazepines** is not recommended.  Exceptions: There are only two exceptions to the rule of not receiving pain medications from other Healthcare Providers. 1. Exception #1 (Emergencies): In the event of an emergency (i.e.: accident requiring emergency care), you are allowed to receive additional pain medication. However, you are responsible for: As soon as you are able, call our office [phone], at any time of the day or night, and leave a message stating your name, the  date and nature of the emergency, and the name and dose of the medication   prescribed. In the event that your call is answered by a member of our staff, make sure to document and save the date, time, and the name of the person that took your information.  2. Exception #2 (Planned Surgery): In the event that you are scheduled by another doctor or dentist to have any type of surgery or procedure, you are allowed (for a period no longer than 30 days), to receive additional pain medication, for the acute post-op pain. However, in this case, you are responsible for picking up a copy of our "Post-op Pain Management for Surgeons" handout, and giving it to your surgeon or dentist. This document is available at our office, and does not require an appointment to obtain it. Simply go to our office during business hours (Monday-Thursday from 8:00 AM to 4:00 PM) (Friday 8:00 AM to 12:00 Noon) or if you have a scheduled appointment with [place], prior to your surgery, and ask for it by name. In addition, you will need to provide [place] with your name, name of your surgeon, type of surgery, and date of procedure or surgery.  *Opioid medications include: morphine, codeine, oxycodone, oxymorphone, hydrocodone, hydromorphone, meperidine, tramadol, tapentadol, buprenorphine, fentanyl, methadone. **Benzodiazepine medications include: diazepam (Valium), alprazolam (Xanax), clonazepam (Klonopine), lorazepam (Ativan), clorazepate (Tranxene), chlordiazepoxide (Librium), estazolam (Prosom), oxazepam (Serax), temazepam (Restoril), triazolam (Halcion) (Last updated: 03/14/2017) ____________________________________________________________________________________________   BMI Assessment: Estimated body mass index is 35.28 kg/m as calculated from the following:   Height as of this encounter: 5\' 8"  (1.727 m).   Weight as of this encounter: 232 lb (105.2 kg).  BMI interpretation table: BMI level Category Range association with higher  incidence of chronic pain  <18 kg/m2 Underweight   18.5-24.9 kg/m2 Ideal body weight   25-29.9 kg/m2 Overweight Increased incidence by 20%  30-34.9 kg/m2 Obese (Class I) Increased incidence by 68%  35-39.9 kg/m2 Severe obesity (Class II) Increased incidence by 136%  >40 kg/m2 Extreme obesity (Class III) Increased incidence by 254%   Patient's current BMI Ideal Body weight  Body mass index is 35.28 kg/m. Ideal body weight: 63.9 kg (140 lb 14 oz) Adjusted ideal body weight: 80.4 kg (177 lb 5.2 oz)   BMI Readings from Last 4 Encounters:  09/25/17 35.28 kg/m  09/02/17 35.12 kg/m  07/08/17 33.36 kg/m  06/25/17 33.67 kg/m   Wt Readings from Last 4 Encounters:  09/25/17 232 lb (105.2 kg)  09/02/17 231 lb (104.8 kg)  07/08/17 213 lb (96.6 kg)  06/25/17 215 lb (97.5 kg)

## 2017-09-25 NOTE — Progress Notes (Signed)
Patient's Name: Wendy Valencia  MRN: [id number]  Referring Provider: Unk Pinto, MD  DOB: 1965/05/08  PCP: Vicie Mutters, PA-C  DOS: 09/25/2017  Note by: Vevelyn Francois NP  Service setting: Ambulatory outpatient  Specialty: Interventional Pain Management  Location: [facility] (AMB) Pain Management Facility    Patient type: Established    Primary Reason(s) for Visit: Encounter for prescription drug management. (Level of risk: moderate)  CC: Hip Pain (bilateral, left is worse )  HPI  Wendy Valencia is a 52 y.o. year old, female patient, who comes today for a medication management evaluation. She has Anxiety state; Essential hypertension; Tobacco abuse; Prediabetes; Insomnia; Medication management; Vitamin D deficiency; S/P gastric bypass; Cervical spondylosis without myelopathy; Fibromyalgia affecting multiple sites (Secondary Area of Pain); Spondylosis of lumbar spine; Chronic generalized pain (Primary Area of Pain); Chronic pain syndrome; Long term current use of opiate analgesic; Long term prescription benzodiazepine use; Pharmacologic therapy; Disorder of skeletal system; Problems influencing health status; Chronic shoulder pain (Tertiary Area of Pain) (Bilateral) (R>L); Chronic hip pain (Fourth Area of Pain) (Bilateral) (R>L); Greater trochanteric bursitis of hips (Bilateral); and Osteoarthritis of hips (Bilateral) on their problem list. Her primarily concern today is the Hip Pain (bilateral, left is worse )  Pain Assessment: Location: Left, Right(left is worse, bursitis ) Hip Radiating: feels that it moves from one hip to the other.  happens when she is at rest and then moves  Onset: More than a month ago Duration: Chronic pain Quality: Discomfort, Sharp, Dull, Sore, Throbbing Severity: 3 /10 (subjective, self-reported pain score)  Note: Reported level is compatible with observation.                          Effect on ADL: uses TENS unit when having to sit at desk for work.  Timing:  Intermittent Modifying factors: TENS unit, heat, massage, stretching, repositioning.  medications seem to work on and off  BP: (!) 134/94  HR: 97  Wendy Valencia was last scheduled for an appointment on 04/15/2017 for medication management. During today's appointment we reviewed Wendy Valencia's chronic pain status, as well as her outpatient medication regimen. She has had PT  She is using the TENS unit.  She is working on her weight.  Since that her hip injections were effective but she does that they are limited.  The patient  reports that she does not use drugs. Her body mass index is 35.28 kg/m.  Further details on both, my assessment(s), as well as the proposed treatment plan, please see below.  Controlled Substance Pharmacotherapy Assessment REMS (Risk Evaluation and Mitigation Strategy)  Analgesic: tramadol 50 mg 2 tablets twice daily (fill date 04/01/2017) tramadol 200 mg per day MME/day:65m/day PJanett Billow RN  09/25/2017 11:23 AM  Sign at close encounter Nursing Pain Medication Assessment:  Safety precautions to be maintained throughout the outpatient stay will include: orient to surroundings, keep bed in low position, maintain call bell within reach at all times, provide assistance with transfer out of bed and ambulation.  Medication Inspection Compliance: Pill count conducted under aseptic conditions, in front of the patient. Neither the pills nor the bottle was removed from the patient's sight at any time. Once count was completed pills were immediately returned to the patient in their original bottle.  Medication: Tramadol (Ultram) Pill/Patch Count: 12 of 120 pills remain Pill/Patch Appearance: Markings consistent with prescribed medication Bottle Appearance: Standard pharmacy container. Clearly labeled. Filled Date: 08 / 16 /  2019 Last Medication intake:  Today   Pharmacokinetics: Liberation and absorption (onset of action): WNL Distribution (time to peak  effect): WNL Metabolism and excretion (duration of action): WNL         Pharmacodynamics: Desired effects: Analgesia: Ms. Foisy reports >50% benefit. Functional ability: Patient reports that medication allows her to accomplish basic ADLs Clinically meaningful improvement in function (CMIF): Sustained CMIF goals met Perceived effectiveness: Described as relatively effective, allowing for increase in activities of daily living (ADL) Undesirable effects: Side-effects or Adverse reactions: None reported Monitoring: [place] PMP: Online review of the past 29-monthperiod conducted. Compliant with practice rules and regulations Last UDS on record: Summary  Date Value Ref Range Status  04/15/2017 FINAL  Final    Comment:    ==================================================================== TOXASSURE COMP DRUG ANALYSIS,UR ==================================================================== Test                             Result       Flag       Units Drug Present and Declared for Prescription Verification   Lorazepam                      1185         EXPECTED   ng/mg creat    Source of lorazepam is a scheduled prescription medication.   Phentermine                    PRESENT      EXPECTED   Tramadol                       >4274        EXPECTED   ng/mg creat   O-Desmethyltramadol            >4274        EXPECTED   ng/mg creat   N-Desmethyltramadol            >4274        EXPECTED   ng/mg creat    Source of tramadol is a prescription medication.    O-desmethyltramadol and N-desmethyltramadol are expected    metabolites of tramadol.   Gabapentin                     PRESENT      EXPECTED   Duloxetine                     PRESENT      EXPECTED   Acetaminophen                  PRESENT      EXPECTED   Diphenhydramine                PRESENT      EXPECTED Drug Absent but Declared for Prescription Verification   Ephedrine/Pseudoephedrine      Not Detected UNEXPECTED   Salicylate                      Not Detected UNEXPECTED    Aspirin, as indicated in the declared medication list, is not    always detected even when used as directed. ==================================================================== Test                      Result    Flag   Units      Ref  Range   Creatinine              117              mg/dL      >=20 ==================================================================== Declared Medications:  The flagging and interpretation on this report are based on the  following declared medications.  Unexpected results may arise from  inaccuracies in the declared medications.  **Note: The testing scope of this panel includes these medications:  Diphenhydramine (Benadryl)  Duloxetine (Cymbalta)  Gabapentin (Neurontin)  Lorazepam  Phentermine  Pseudoephedrine (Zyrtec D)  Tramadol  **Note: The testing scope of this panel does not include small to  moderate amounts of these reported medications:  Acetaminophen  Aspirin  **Note: The testing scope of this panel does not include following  reported medications:  Betamethasone  Caffeine  Cetirizine (Zyrtec D)  Hydrochlorothiazide (Lisinopril-HCTZ)  Iron (Ferrous Sulfate)  Lisinopril (Lisinopril-HCTZ)  Magnesium  Metoclopramide  Multivitamin  Supplement  Vitamin D2 (Drisdol) ==================================================================== For clinical consultation, please call [phone]. ====================================================================    UDS interpretation: Compliant          Medication Assessment Form: Reviewed. Patient indicates being compliant with therapy Treatment compliance: Compliant Risk Assessment Profile: Aberrant behavior: See prior evaluations. None observed or detected today Comorbid factors increasing risk of overdose: See prior notes. No additional risks detected today Opioid risk tool (ORT) (Total Score): 0 Personal History of Substance Abuse (SUD-Substance use  disorder):  Alcohol: Negative  Illegal Drugs: Negative  Rx Drugs: Negative  ORT Risk Level calculation: Low Risk Risk of substance use disorder (SUD): Low Opioid Risk Tool - 09/25/17 1120      Family History of Substance Abuse   Alcohol  Negative    Illegal Drugs  Negative    Rx Drugs  Negative      Personal History of Substance Abuse   Alcohol  Negative    Illegal Drugs  Negative    Rx Drugs  Negative      Psychological Disease   Psychological Disease  Negative    Depression  Negative      Total Score   Opioid Risk Tool Scoring  0    Opioid Risk Interpretation  Low Risk      ORT Scoring interpretation table:  Score <3 = Low Risk for SUD  Score between 4-7 = Moderate Risk for SUD  Score >8 = High Risk for Opioid Abuse   Risk Mitigation Strategies:  Patient Counseling: Covered Patient-Prescriber Agreement (PPA): Present and active  Notification to other healthcare providers: Done  Pharmacologic Plan: No change in therapy, at this time.             Laboratory Chemistry  Inflammation Markers (CRP: Acute Phase) (ESR: Chronic Phase) Lab Results  Component Value Date   CRP 0.9 03/04/2017   ESRSEDRATE 22 (H) 03/04/2017                         Rheumatology Markers No results found for: RF, ANA, LABURIC, URICUR, LYMEIGGIGMAB, LYMEABIGMQN, HLAB27                      Renal Function Markers Lab Results  Component Value Date   BUN 11 09/02/2017   CREATININE 0.78 00/45/9977   BCR NOT APPLICABLE 41/42/3953   GFRAA 102 09/02/2017   GFRNONAA 88 09/02/2017  Hepatic Function Markers Lab Results  Component Value Date   AST 32 09/02/2017   ALT 46 (H) 09/02/2017   ALBUMIN 4.5 03/05/2016   ALKPHOS 123 (H) 03/05/2016                        Electrolytes Lab Results  Component Value Date   NA 140 09/02/2017   K 3.8 09/02/2017   CL 103 09/02/2017   CALCIUM 9.4 09/02/2017   MG 2.0 09/02/2017                        Neuropathy Markers Lab  Results  Component Value Date   MPNTIRWE31 540 09/02/2017   HGBA1C 5.9 (H) 09/02/2017   HIV NON REACTIVE 02/16/2013                        CNS Tests No results found for: SDES, GRAMSTAIN, CULT, COLORCSF, APPEARCSF, RBCCOUNTCSF, WBCCSF, POLYSCSF, LYMPHSCSF, EOSCSF, PROTEINCSF, GLUCCSF, JCVIRUS, CSFOLI, IGGCSF, IGGALB, IGGIND                      Bone Pathology Markers Lab Results  Component Value Date   VD25OH 34 09/02/2017                         Coagulation Parameters Lab Results  Component Value Date   PLT 347 09/02/2017   DDIMER 0.36 09/12/2012                        Cardiovascular Markers Lab Results  Component Value Date   TROPONINI <0.30 09/12/2012   HGB 11.6 (L) 09/02/2017   HCT 36.3 09/02/2017                         CA Markers No results found for: CEA, CA125, LABCA2                      Note: Lab results reviewed.  Recent Diagnostic Imaging Results  US PELVIC COMPLETE WITH TRANSVAGINAL CLINICAL DATA:  Postmenopausal bleeding.  EXAM: TRANSABDOMINAL AND TRANSVAGINAL ULTRASOUND OF PELVIS  TECHNIQUE: Both transabdominal and transvaginal ultrasound examinations of the pelvis were performed. Transabdominal technique was performed for global imaging of the pelvis including uterus, ovaries, adnexal regions, and pelvic cul-de-sac. It was necessary to proceed with endovaginal exam following the transabdominal exam to visualize the endometrium and ovaries.  COMPARISON:  None  FINDINGS: Uterus  Measurements: 9.6 x 4.6 x 6.1 cm.  No discrete mass.  Endometrium  Thickness: 6.8 mm.  No focal abnormality visualized.  Right ovary  Measurements: 3.2 x 2.3 x 2.7 cm. Normal appearance/no adnexal mass.  Left ovary  Measurements: 3.6 x 3.5 x 3.0 cm. There is a cyst in the left ovary measuring 3.4 x 2.6 x 2.3 cm. This cyst contains a single septation which demonstrates blood flow. No solid nodular components.  Other findings  No abnormal free  fluid.  IMPRESSION: 1. Endometrial thickening given postmenopausal state. In the setting of post-menopausal bleeding, endometrial sampling is indicated to exclude carcinoma. If results are benign, sonohysterogram should be considered for focal lesion work-up. (Ref: Radiological Reasoning: Algorithmic Workup of Abnormal Vaginal Bleeding with Endovaginal Sonography and Sonohysterography. AJR 2008; 086:P61-95) 2. 3.4 x 2.6 x 2.3 cm septated cyst in the left ovary. There is a single septation of borderline  thickness. There is blood flow within the septation. Recommend gynecologic consultation. An MRI could further assess as clinically warranted.  These results will be called to the ordering clinician or representative by the Radiologist Assistant, and communication documented in the PACS or zVision Dashboard.  Electronically Signed   By: Dorise Bullion III M.D   On: 09/21/2017 12:26  Complexity Note: Imaging results reviewed. Results shared with Wendy Valencia, using Layman's terms.                         Meds   Current Outpatient Medications:  .  Albuterol Sulfate (PROVENTIL HFA IN), Inhale 108 mcg into the lungs as needed., Disp: , Rfl:  .  Aspirin-Acetaminophen-Caffeine (EXCEDRIN EXTRA STRENGTH PO), Take 1 tablet by mouth 3 (three) times daily as needed (headache). , Disp: , Rfl:  .  augmented betamethasone dipropionate (DIPROLENE-AF) 0.05 % ointment, Apply topically daily. (Patient taking differently: Apply 1 application topically 3 (three) times daily as needed. ), Disp: 50 g, Rfl: 3 .  cetirizine-pseudoephedrine (ZYRTEC-D) 5-120 MG tablet, Take 1 tablet by mouth every 12 (twelve) hours as needed for allergies. , Disp: , Rfl:  .  ciprofloxacin (CILOXAN) 0.3 % ophthalmic solution, Place 1-2 drops into both eyes as needed., Disp: , Rfl: 0 .  diphenhydrAMINE (BENADRYL) 25 MG tablet, Take 25-50 mg by mouth 3 (three) times daily as needed for allergies., Disp: , Rfl:  .  doxepin  (SINEQUAN) 25 MG capsule, TAKE ONE CAPSULE BY MOUTH TWO HOURS BEFORE BEDTIME AND MAY REPEAT AT HOUR OF SLEEP, Disp: 90 capsule, Rfl: 0 .  DULoxetine (CYMBALTA) 60 MG capsule, TAKE ONE CAPSULE BY MOUTH DAILY, Disp: 90 capsule, Rfl: 1 .  ferrous sulfate 325 (65 FE) MG EC tablet, Take 325 mg by mouth at bedtime. , Disp: , Rfl:  .  gabapentin (NEURONTIN) 300 MG capsule, TAKE ONE CAPSULE BY MOUTH THREE TIMES A DAY AS NEEDED FOR PAIN, Disp: 90 capsule, Rfl: 0 .  Ginkgo Biloba (GINKOBA PO), Take 1 capsule by mouth daily. , Disp: , Rfl:  .  Glucosamine-Chondroitin (COSAMIN DS PO), Take 1,500 mg by mouth daily., Disp: , Rfl:  .  lisinopril-hydrochlorothiazide (PRINZIDE,ZESTORETIC) 20-12.5 MG tablet, TAKE 1 TABLET BY MOUTH EVERY DAY, Disp: 90 tablet, Rfl: 0 .  Magnesium 500 MG TABS, Take 500 mg by mouth 2 (two) times daily. , Disp: , Rfl:  .  metoCLOPramide (REGLAN) 10 MG tablet, TAKE ONE TABLET BY MOUTH ONCE DAILY (Patient taking differently: TAKE ONE TABLET BY MOUTH ONCE DAILY as needed heartburn), Disp: 90 tablet, Rfl: 0 .  Multiple Vitamins-Minerals (MULTIVITAMIN WITH MINERALS) tablet, Take 1 tablet by mouth daily. ., Disp: , Rfl:  .  naproxen (NAPROSYN) 500 MG tablet, Take 1 tablet (500 mg total) by mouth 2 (two) times daily., Disp: 30 tablet, Rfl: 0 .  phentermine (ADIPEX-P) 37.5 MG tablet, Take 1 tablet (37.5 mg total) by mouth daily before breakfast., Disp: 30 tablet, Rfl: 2 .  promethazine-dextromethorphan (PROMETHAZINE-DM) 6.25-15 MG/5ML syrup, Take 5 mLs by mouth at bedtime., Disp: , Rfl: 0 .  Spacer/Aero-Holding Chambers (AEROCHAMBER PLUS WITH MASK) inhaler, Use as instructed, Disp: 1 each, Rfl: 2 .  TURMERIC PO, Take 1,000 mg by mouth daily., Disp: , Rfl:  .  valACYclovir (VALTREX) 500 MG tablet, Take 500 mg by mouth as needed., Disp: , Rfl:  .  Vitamin D, Ergocalciferol, (DRISDOL) 50000 units CAPS capsule, TAKE ONE CAPSULE BY MOUTH TWICE WEEKLY, Disp: 8 capsule, Rfl:  3 .  traMADol (ULTRAM) 50 MG  tablet, Take 1 tablet (50 mg total) by mouth every 6 (six) hours as needed for severe pain., Disp: 120 tablet, Rfl: 2  ROS  Constitutional: Denies any fever or chills Gastrointestinal: No reported hemesis, hematochezia, vomiting, or acute GI distress Musculoskeletal: Denies any acute onset joint swelling, redness, loss of ROM, or weakness Neurological: No reported episodes of acute onset apraxia, aphasia, dysarthria, agnosia, amnesia, paralysis, loss of coordination, or loss of consciousness  Allergies  Wendy Valencia has No Known Allergies.  PFSH  Drug: Wendy Valencia  reports that she does not use drugs. Alcohol:  reports that she drinks about 1.0 standard drinks of alcohol per week. Tobacco:  reports that she has been smoking cigarettes. She has a 1.75 pack-year smoking history. She has never used smokeless tobacco. Medical:  has a past medical history of Allergy, Anemia, Anxiety, Arthritis, Fibromyalgia, GERD (gastroesophageal reflux disease), Hypertension, IBS (irritable bowel syndrome), Nephrolithiasis, and Plantar fasciitis of right foot. Surgical: Wendy Valencia  has a past surgical history that includes Left shoulder arthroscopic surgery (yrs ago); tummy tuck (2005); Breath tek h pylori (07/11/2011); Gastric Roux-En-Y (10/01/2011); Cholecystectomy (10/01/2011); and Colonoscopy. Family: family history includes Breast cancer (age of onset: 98) in her mother; Breast cancer (age of onset: 40) in her maternal aunt; Cancer in her maternal aunt and maternal grandmother; Cancer (age of onset: 66) in her mother; Heart attack in her maternal grandfather and paternal grandfather; Heart disease in her father and maternal grandfather; Hypertension in her father; Lung cancer in her maternal grandmother; Multiple sclerosis in her maternal uncle; Obesity in her brother; Stroke in her father.  Constitutional Exam  General appearance: Well nourished, well developed, and well hydrated. In no apparent acute  distress Vitals:   09/25/17 1103  BP: (!) 134/94  Pulse: 97  Resp: 16  Temp: 99.1 F (37.3 C)  TempSrc: Oral  SpO2: 99%  Weight: 232 lb (105.2 kg)  Height: '5\' 8"'  (1.727 m)  Psych/Mental status: Alert, oriented x 3 (person, place, & time)       Eyes: PERLA Respiratory: No evidence of acute respiratory distress  Cervical Spine Area Exam  Skin & Axial Inspection: No masses, redness, edema, swelling, or associated skin lesions Alignment: Symmetrical Functional ROM: Unrestricted ROM      Stability: No instability detected Muscle Tone/Strength: Functionally intact. No obvious neuro-muscular anomalies detected. Sensory (Neurological): Unimpaired Palpation: No palpable anomalies              Upper Extremity (UE) Exam    Side: Right upper extremity  Side: Left upper extremity  Skin & Extremity Inspection: Skin color, temperature, and hair growth are WNL. No peripheral edema or cyanosis. No masses, redness, swelling, asymmetry, or associated skin lesions. No contractures.  Skin & Extremity Inspection: Skin color, temperature, and hair growth are WNL. No peripheral edema or cyanosis. No masses, redness, swelling, asymmetry, or associated skin lesions. No contractures.  Functional ROM: Unrestricted ROM          Functional ROM: Unrestricted ROM          Muscle Tone/Strength: Functionally intact. No obvious neuro-muscular anomalies detected.  Muscle Tone/Strength: Functionally intact. No obvious neuro-muscular anomalies detected.  Sensory (Neurological): Unimpaired          Sensory (Neurological): Unimpaired          Palpation: No palpable anomalies              Palpation: No palpable anomalies  Provocative Test(s):  Phalen's test: deferred Tinel's test: deferred Apley's scratch test (touch opposite shoulder):  Action 1 (Across chest): deferred Action 2 (Overhead): deferred Action 3 (LB reach): deferred   Provocative Test(s):  Phalen's test: deferred Tinel's test:  deferred Apley's scratch test (touch opposite shoulder):  Action 1 (Across chest): deferred Action 2 (Overhead): deferred Action 3 (LB reach): deferred    Thoracic Spine Area Exam  Skin & Axial Inspection: No masses, redness, or swelling Alignment: Symmetrical Functional ROM: Unrestricted ROM Stability: No instability detected Muscle Tone/Strength: Functionally intact. No obvious neuro-muscular anomalies detected. Sensory (Neurological): Unimpaired Muscle strength & Tone: No palpable anomalies  Lumbar Spine Area Exam  Skin & Axial Inspection: No masses, redness, or swelling Alignment: Symmetrical Functional ROM: Unrestricted ROM       Stability: No instability detected Muscle Tone/Strength: Functionally intact. No obvious neuro-muscular anomalies detected. Sensory (Neurological): Unimpaired Palpation: No palpable anomalies       Provocative Tests: Hyperextension/rotation test: deferred today       Lumbar quadrant test (Kemp's test): deferred today       Lateral bending test: deferred today       Patrick's Maneuver: deferred today                   FABER test: deferred today                   S-I anterior distraction/compression test: deferred today         S-I lateral compression test: deferred today         S-I Thigh-thrust test: deferred today         S-I Gaenslen's test: deferred today          Gait & Posture Assessment  Ambulation: Unassisted Gait: Relatively normal for age and body habitus Posture: WNL   Lower Extremity Exam    Side: Right lower extremity  Side: Left lower extremity  Stability: No instability observed          Stability: No instability observed          Skin & Extremity Inspection: Skin color, temperature, and hair growth are WNL. No peripheral edema or cyanosis. No masses, redness, swelling, asymmetry, or associated skin lesions. No contractures.  Skin & Extremity Inspection: Skin color, temperature, and hair growth are WNL. No peripheral edema or  cyanosis. No masses, redness, swelling, asymmetry, or associated skin lesions. No contractures.  Functional ROM: Unrestricted ROM                  Functional ROM: Unrestricted ROM                  Muscle Tone/Strength: Functionally intact. No obvious neuro-muscular anomalies detected.  Muscle Tone/Strength: Functionally intact. No obvious neuro-muscular anomalies detected.  Sensory (Neurological): Unimpaired  Sensory (Neurological): Unimpaired  Palpation: No palpable anomalies  Palpation: No palpable anomalies   Assessment  Primary Diagnosis & Pertinent Problem List: The primary encounter diagnosis was Chronic hip pain (Fourth Area of Pain) (Bilateral) (R>L). Diagnoses of Fibromyalgia affecting multiple sites (Secondary Area of Pain), Osteoarthritis of hips (Bilateral), and Chronic pain syndrome were also pertinent to this visit.  Status Diagnosis  Persistent Persistent Persistent 1. Chronic hip pain (Fourth Area of Pain) (Bilateral) (R>L)   2. Fibromyalgia affecting multiple sites (Secondary Area of Pain)   3. Osteoarthritis of hips (Bilateral)   4. Chronic pain syndrome     Problems updated and reviewed during this visit: No problems updated. Plan  of Care  Pharmacotherapy (Medications Ordered): Meds ordered this encounter  Medications  . traMADol (ULTRAM) 50 MG tablet    Sig: Take 1 tablet (50 mg total) by mouth every 6 (six) hours as needed for severe pain.    Dispense:  120 tablet    Refill:  2    Do not place this medication on "Automatic Refill". Patient may have prescription filled one day early if pharmacy is closed on scheduled refill date. Do not fill until: To last until:    Order Specific Question:   Supervising Provider    Answer:   Milinda Pointer [phone]   New Prescriptions   TRAMADOL (ULTRAM) 50 MG TABLET    Take 1 tablet (50 mg total) by mouth every 6 (six) hours as needed for severe pain.   Medications administered today: Wendy Valencia had no  medications administered during this visit. Lab-work, procedure(s), and/or referral(s): No orders of the defined types were placed in this encounter.  Imaging and/or referral(s): None  Interventional management options: Planned, scheduled, and/or pending:   Non at this time.   Considering:   Diagnostic bilateral intra-articular shoulder joint injection  Diagnostic bilateral suprascapular nerve block  Possible bilateral suprascapular nerve RFA  Diagnostic bilateral intra-articular hip joint injection  Diagnostic/therapeutic bilateral trochanteric bursa injection  Diagnostic bilateral femoral nerve + obturator nerve block  Possible bilateral femoral nerve + obturator nerve RFA  Diagnostic/therapeutic Lidocaine infusion  Diagnostic/therapeutic Trigger point injection  Diagnostic bilateral cervical facet block  Diagnostic bilateral cervical facet RFA    Palliative PRN treatment(s):   Diagnostic bilateral intra-articular hip joint injection #2  + bilateral trochanteric bursa injection #2, under fluoroscopic guidance and IV sedation   Provider-requested follow-up: Return in about 3 months (around 12/25/2017) for MedMgmt with Me Dionisio David).  Future Appointments  Date Time Provider Vander  10/08/2017 11:30 AM Princess Bruins, MD [facility]  12/05/2017  4:00 PM Vicie Mutters, PA-C [facility] None  09/03/2018  9:00 AM Vicie Mutters, Randolm Idol None   Primary Care Physician: Vicie Mutters, PA-C Location: [facility] Note by: Vevelyn Francois NP Date: 09/25/2017; Time: 3:48 PM  Pain Score Disclaimer: We use the NRS-11 scale. This is a self-reported, subjective measurement of pain severity with only modest accuracy. It is used primarily to identify changes within a particular patient. It must be understood that outpatient pain scales are significantly less accurate that those used for research, where they can be applied under  ideal controlled circumstances with minimal exposure to variables. In reality, the score is likely to be a combination of pain intensity and pain affect, where pain affect describes the degree of emotional arousal or changes in action readiness caused by the sensory experience of pain. Factors such as social and work situation, setting, emotional state, anxiety levels, expectation, and prior pain experience may influence pain perception and show large inter-individual differences that may also be affected by time variables.  Patient instructions provided during this appointment: Patient Instructions   ____________________________________________________________________________________________  Medication Rules  Applies to: All patients receiving prescriptions (written or electronic).  Pharmacy of record: Pharmacy where electronic prescriptions will be sent. If written prescriptions are taken to a different pharmacy, please inform the nursing staff. The pharmacy listed in the electronic medical record should be the one where you would like electronic prescriptions to be sent.  Prescription refills: Only during scheduled appointments. Applies to both, written and electronic prescriptions.  NOTE: The following applies primarily to controlled substances (  Opioid* Pain Medications).   Patient's responsibilities: 1. Pain Pills: Bring all pain pills to every appointment (except for procedure appointments). 2. Pill Bottles: Bring pills in original pharmacy bottle. Always bring newest bottle. Bring bottle, even if empty. 3. Medication refills: You are responsible for knowing and keeping track of what medications you need refilled. The day before your appointment, write a list of all prescriptions that need to be refilled. Bring that list to your appointment and give it to the admitting nurse. Prescriptions will be written only during appointments. If you forget a medication, it will not be "Called in",  "Faxed", or "electronically sent". You will need to get another appointment to get these prescribed. 4. Prescription Accuracy: You are responsible for carefully inspecting your prescriptions before leaving our office. Have the discharge nurse carefully go over each prescription with you, before taking them home. Make sure that your name is accurately spelled, that your address is correct. Check the name and dose of your medication to make sure it is accurate. Check the number of pills, and the written instructions to make sure they are clear and accurate. Make sure that you are given enough medication to last until your next medication refill appointment. 5. Taking Medication: Take medication as prescribed. Never take more pills than instructed. Never take medication more frequently than prescribed. Taking less pills or less frequently is permitted and encouraged, when it comes to controlled substances (written prescriptions).  6. Inform other Doctors: Always inform, all of your healthcare providers, of all the medications you take. 7. Pain Medication from other Providers: You are not allowed to accept any additional pain medication from any other Doctor or Healthcare provider. There are two exceptions to this rule. (see below) In the event that you require additional pain medication, you are responsible for notifying us, as stated below. 8. Medication Agreement: You are responsible for carefully reading and following our Medication Agreement. This must be signed before receiving any prescriptions from our practice. Safely store a copy of your signed Agreement. Violations to the Agreement will result in no further prescriptions. (Additional copies of our Medication Agreement are available upon request.) 9. Laws, Rules, & Regulations: All patients are expected to follow all Federal and [employer], [employer], Rules, [employer]. Ignorance of the Laws does not constitute a valid excuse. The use of any illegal  substances is prohibited. 10. Adopted CDC guidelines & recommendations: Target dosing levels will be at or below 60 MME/day. Use of benzodiazepines** is not recommended.  Exceptions: There are only two exceptions to the rule of not receiving pain medications from other Healthcare Providers. 1. Exception #1 (Emergencies): In the event of an emergency (i.e.: accident requiring emergency care), you are allowed to receive additional pain medication. However, you are responsible for: As soon as you are able, call our office [phone], at any time of the day or night, and leave a message stating your name, the date and nature of the emergency, and the name and dose of the medication prescribed. In the event that your call is answered by a member of our staff, make sure to document and save the date, time, and the name of the person that took your information.  2. Exception #2 (Planned Surgery): In the event that you are scheduled by another doctor or dentist to have any type of surgery or procedure, you are allowed (for a period no longer than 30 days), to receive additional pain medication, for the acute post-op pain. However, in  this case, you are responsible for picking up a copy of our "Post-op Pain Management for Surgeons" handout, and giving it to your surgeon or dentist. This document is available at our office, and does not require an appointment to obtain it. Simply go to our office during business hours (Monday-Thursday from 8:00 AM to 4:00 PM) (Friday 8:00 AM to 12:00 Noon) or if you have a scheduled appointment with [place], prior to your surgery, and ask for it by name. In addition, you will need to provide [place] with your name, name of your surgeon, type of surgery, and date of procedure or surgery.  *Opioid medications include: morphine, codeine, oxycodone, oxymorphone, hydrocodone, hydromorphone, meperidine, tramadol, tapentadol, buprenorphine, fentanyl, methadone. **Benzodiazepine medications  include: diazepam (Valium), alprazolam (Xanax), clonazepam (Klonopine), lorazepam (Ativan), clorazepate (Tranxene), chlordiazepoxide (Librium), estazolam (Prosom), oxazepam (Serax), temazepam (Restoril), triazolam (Halcion) (Last updated: 03/14/2017) ____________________________________________________________________________________________   BMI Assessment: Estimated body mass index is 35.28 kg/m as calculated from the following:   Height as of this encounter: '5\' 8"'  (1.727 m).   Weight as of this encounter: 232 lb (105.2 kg).  BMI interpretation table: BMI level Category Range association with higher incidence of chronic pain  <18 kg/m2 Underweight   18.5-24.9 kg/m2 Ideal body weight   25-29.9 kg/m2 Overweight Increased incidence by 20%  30-34.9 kg/m2 Obese (Class I) Increased incidence by 68%  35-39.9 kg/m2 Severe obesity (Class II) Increased incidence by 136%  >40 kg/m2 Extreme obesity (Class III) Increased incidence by 254%   Patient's current BMI Ideal Body weight  Body mass index is 35.28 kg/m. Ideal body weight: 63.9 kg (140 lb 14 oz) Adjusted ideal body weight: 80.4 kg (177 lb 5.2 oz)   BMI Readings from Last 4 Encounters:  09/25/17 35.28 kg/m  09/02/17 35.12 kg/m  07/08/17 33.36 kg/m  06/25/17 33.67 kg/m   Wt Readings from Last 4 Encounters:  09/25/17 232 lb (105.2 kg)  09/02/17 231 lb (104.8 kg)  07/08/17 213 lb (96.6 kg)  06/25/17 215 lb (97.5 kg)

## 2017-10-08 ENCOUNTER — Ambulatory Visit (INDEPENDENT_AMBULATORY_CARE_PROVIDER_SITE_OTHER): Payer: Managed Care, Other (non HMO) | Admitting: Obstetrics & Gynecology

## 2017-10-08 ENCOUNTER — Encounter: Payer: Self-pay | Admitting: Obstetrics & Gynecology

## 2017-10-08 VITALS — BP 132/84 | Ht 67.0 in | Wt 233.0 lb

## 2017-10-08 DIAGNOSIS — N83292 Other ovarian cyst, left side: Secondary | ICD-10-CM

## 2017-10-08 DIAGNOSIS — R9389 Abnormal findings on diagnostic imaging of other specified body structures: Secondary | ICD-10-CM | POA: Diagnosis not present

## 2017-10-08 DIAGNOSIS — N950 Postmenopausal bleeding: Secondary | ICD-10-CM | POA: Diagnosis not present

## 2017-10-08 NOTE — Patient Instructions (Signed)
1. [facility].  If menopause confirmed, postmenopausal bleeding for 2 to 3 months.  Pelvic ultrasound done September 20, 2017 showed an endometrial line at 6.8 mm.  Decision to perform an endometrial biopsy today.  Procedure performed without difficulty and well-tolerated by the patient.  Pending endometrial biopsy results.  Follow-up to discuss results and decide on management. - [facility]  2. Thickened endometrium Endometrial thickness measured at 6.8 mm by ultrasound.  No focal lesions seen.  Endometrial biopsy performed today.  Pending results. - [facility]SH  3. Complex cyst of left ovary Left complex ovarian cyst measuring 3.4 cm with a single septation demonstrating blood flow.  Ca125 done today.  Will repeat a pelvic ultrasound to reevaluate the left ovary at follow-up. - US Transvaginal Non-OB; Future - CA 125  Wendy Valencia, it was a pleasure meeting you today!  I will inform you of your results as soon as they are available.  See you soon for the pelvic ultrasound and discussion of results.

## 2017-10-08 NOTE — Progress Notes (Signed)
    Wendy Valencia 1965/04/15 [id number]        52 y.o.  G3P1021 married  RP: PMB x 2 months  HPI: Patient was on Depo-Provera of which she received the last dose in September 2018 because she was assumed to be in menopause.  No menstrual periods and no vaginal bleeding until 2 to 3 months ago when she started having irregular bleeding with occasional blood clots.  No bleeding today and no pelvic pain.  A pelvic ultrasound done on September 6 showed an endometrial line at 6.8 mm and a left complex ovarian cyst measuring 3.4 cm with one septation with vascularity.   OB History  Gravida Para Term Preterm AB Living  3 1 1   2 1   SAB TAB Ectopic Multiple Live Births  2            # Outcome Date GA Lbr Len/2nd Weight Sex Delivery Anes PTL Lv  3 Term 121990    M Vag-Spont     2 SAB           1 SAB             Past medical history,surgical history, problem list, medications, allergies, family history and social history were all reviewed and documented in the EPIC chart.   Directed ROS with pertinent positives and negatives documented in the history of present illness/assessment and plan.  Exam:  Vitals:   10/08/17 1141  BP: 132/84  Weight: 233 lb (105.7 kg)  Height: 5\' 7"  (1.702 m)   General appearance:  Normal  Abdomen: Normal  Gynecologic exam: Vulva normal.  Speculum:  Cervix normal, no polyp.  Vagina normal.    Verbal consent obtained for EBx:  Betadine prep on cervix.  Hurricaine spray of the cervix.  Easy insertion of the endometrial biopsy cannula.  Endometrial biopsy from all intrauterine surfaces.  Good specimen obtained.  Sent to pathology.  Well-tolerated by patient.  Pelvic US 09/20/2017: Uterus Measurements: 9.6 x 4.6 x 6.1 cm.  No discrete mass. Endometrium Thickness: 6.8 mm.  No focal abnormality visualized. Right ovary Measurements: 3.2 x 2.3 x 2.7 cm. Normal appearance/no adnexal mass. Left ovary Measurements: 3.6 x 3.5 x 3.0 cm. There is a cyst in the left ovary  measuring 3.4 x 2.6 x 2.3 cm. This cyst contains a single septation which demonstrates blood flow. No solid nodular components. No abnormal free fluid.   Assessment/Plan:  52 y.o. G3P1021   1. [facility].  If menopause confirmed, postmenopausal bleeding for 2 to 3 months.  Pelvic ultrasound done September 20, 2017 showed an endometrial line at 6.8 mm.  Decision to perform an endometrial biopsy today.  Procedure performed without difficulty and well-tolerated by the patient.  Pending endometrial biopsy results.  Follow-up to discuss results and decide on management. - [facility]  2. Thickened endometrium Endometrial thickness measured at 6.8 mm by ultrasound.  No focal lesions seen.  Endometrial biopsy performed today.  Pending results. - [facility]SH  3. Complex cyst of left ovary Left complex ovarian cyst measuring 3.4 cm with a single septation demonstrating blood flow.  Ca125 done today.  Will repeat a pelvic ultrasound to reevaluate the left ovary at follow-up. - US Transvaginal Non-OB; Future - CA 125  Counseling on above issues and coordination of care more than 50% for 45 minutes.  Genia DelMarie-Lyne Junette Bernat MD, 11:52 AM 10/08/2017

## 2017-10-09 LAB — CA 125: CA 125: 11 U/mL (ref <35)

## 2017-10-09 LAB — FOLLICLE STIMULATING HORMONE: FSH: 33.4 m[IU]/mL

## 2017-10-10 LAB — PATHOLOGY

## 2017-10-10 LAB — TISSUE SPECIMEN

## 2017-10-11 ENCOUNTER — Other Ambulatory Visit: Payer: Self-pay | Admitting: Obstetrics & Gynecology

## 2017-10-11 MED ORDER — MEDROXYPROGESTERONE ACETATE 5 MG PO TABS: ORAL_TABLET | ORAL | 4 refills | Status: DC

## 2017-10-26 ENCOUNTER — Other Ambulatory Visit: Payer: Self-pay | Admitting: Internal Medicine

## 2017-10-26 DIAGNOSIS — M797 Fibromyalgia: Secondary | ICD-10-CM

## 2017-10-28 ENCOUNTER — Other Ambulatory Visit: Payer: Self-pay | Admitting: Physician Assistant

## 2017-10-28 MED ORDER — BETAMETHASONE DIPROPIONATE 0.05 % EX OINT: TOPICAL_OINTMENT | Freq: Two times a day (BID) | CUTANEOUS | 0 refills | Status: DC

## 2017-10-29 ENCOUNTER — Ambulatory Visit: Payer: Managed Care, Other (non HMO) | Admitting: Obstetrics & Gynecology

## 2017-10-29 ENCOUNTER — Other Ambulatory Visit: Payer: Managed Care, Other (non HMO)

## 2017-11-12 ENCOUNTER — Other Ambulatory Visit: Payer: Self-pay | Admitting: Physician Assistant

## 2017-11-25 ENCOUNTER — Other Ambulatory Visit: Payer: Self-pay | Admitting: Internal Medicine

## 2017-11-25 ENCOUNTER — Other Ambulatory Visit: Payer: Self-pay | Admitting: Physician Assistant

## 2017-11-25 DIAGNOSIS — M797 Fibromyalgia: Secondary | ICD-10-CM

## 2017-11-25 DIAGNOSIS — G4700 Insomnia, unspecified: Secondary | ICD-10-CM

## 2017-12-04 NOTE — Progress Notes (Deleted)
Assessment and Plan:  Hypertension -Continue medication, monitor blood pressure at home. Continue DASH diet.  Reminder to go to the ER if any CP, SOB, nausea, dizziness, severe HA, changes vision/speech, left arm numbness and tingling and jaw pain.   Cholesterol -Continue diet and exercise. Check cholesterol.   Prediabetes  -Continue diet and exercise. Check A1C   Vitamin D Def - check level and continue medications.   Patient is s/p gastric bypass - Check labs to do to screen for vitamin deficiencies associated with gastric bypass including Vitamin D, B12, iron, RBC magnesium. Recommend strict diet and exercise.    Continue diet and meds as discussed. Further disposition pending results of labs. Over 30 minutes of exam, counseling, chart review, and critical decision making was performed Future Appointments  Date Time Provider Department Center  12/05/2017  4:00 PM Quentin MullingCollier, Tiara Maultsby, PA-C [facility] None  12/11/2017  8:30 AM [facility] ULTRASOUND MACHINE [facility] None  12/11/2017  9:00 AM Genia DelLavoie, Marie-Lyne, MD [facility]  09/03/2018  9:00 AM Quentin Mullingollier, Jaleesa Cervi, PA-C [facility] None    HPI 52 y.o. female  presents for 3 month follow up on hypertension, cholesterol, prediabetes, and vitamin D deficiency.   Her blood pressure has been controlled at home, today their BP is    She does workout. She denies chest pain, shortness of breath, dizziness.  She is not on cholesterol medication and denies myalgias. Her cholesterol is at goal. The cholesterol last visit was:   Lab Results  Component Value Date   CHOL 173 09/02/2017   HDL 80 09/02/2017   LDLCALC 77 09/02/2017   TRIG 80 09/02/2017   CHOLHDL 2.2 09/02/2017    She has been working on diet and exercise for prediabetes, and denies paresthesia of the feet, polydipsia, polyuria and visual disturbances. Last A1C in the office was:  Lab Results  Component Value Date   HGBA1C 5.9 (H) 09/02/2017   Patient is on Vitamin D supplement.    Lab Results  Component Value Date   VD25OH 34 09/02/2017   She is following with pain management for DDD/DJD.    BMI is There is no height or weight on file to calculate BMI., she is working on diet and exercise. Wt Readings from Last 3 Encounters:  10/08/17 233 lb (105.7 kg)  09/25/17 232 lb (105.2 kg)  09/02/17 231 lb (104.8 kg)     Current Medications:  Current Outpatient Medications on File Prior to Visit  Medication Sig Dispense Refill  . Albuterol Sulfate (PROVENTIL HFA IN) Inhale 108 mcg into the lungs as needed.    . Aspirin-Acetaminophen-Caffeine (EXCEDRIN EXTRA STRENGTH PO) Take 1 tablet by mouth 3 (three) times daily as needed (headache).     Marland Kitchen. augmented betamethasone dipropionate (DIPROLENE-AF) 0.05 % ointment Apply topically daily. (Patient taking differently: Apply 1 application topically 3 (three) times daily as needed. ) 50 g 3  . betamethasone dipropionate (DIPROLENE) 0.05 % ointment Apply topically 2 (two) times daily. 30 g 0  . cetirizine-pseudoephedrine (ZYRTEC-D) 5-120 MG tablet Take 1 tablet by mouth every 12 (twelve) hours as needed for allergies.     . ciprofloxacin (CILOXAN) 0.3 % ophthalmic solution Place 1-2 drops into both eyes as needed.  0  . diphenhydrAMINE (BENADRYL) 25 MG tablet Take 25-50 mg by mouth 3 (three) times daily as needed for allergies.    Marland Kitchen. doxepin (SINEQUAN) 25 MG capsule TAKE ONE CAPSULE BY MOUTH 2 HOURS BEFORE BEDTIME AND MAY REPEAT AT HOUR OF SLEEP 30 capsule 0  .  DULoxetine (CYMBALTA) 60 MG capsule TAKE ONE CAPSULE BY MOUTH DAILY 30 capsule 0  . ferrous sulfate 325 (65 FE) MG EC tablet Take 325 mg by mouth at bedtime.     . gabapentin (NEURONTIN) 300 MG capsule TAKE ONE CAPSULE BY MOUTH THREE TIMES A DAY AS NEEDED FOR PAIN 90 capsule 0  . Ginkgo Biloba (GINKOBA PO) Take 1 capsule by mouth daily.     . Glucosamine-Chondroitin (COSAMIN DS PO) Take 1,500 mg by mouth daily.    Marland Kitchen lisinopril-hydrochlorothiazide (PRINZIDE,ZESTORETIC) 20-12.5  MG tablet TAKE 1 TABLET BY MOUTH EVERY DAY 90 tablet 0  . Magnesium 500 MG TABS Take 500 mg by mouth 2 (two) times daily.     . medroxyPROGESTERone (PROVERA) 5 MG tablet Take one tab daily x 10 days. Repeat every 3 mos. 10 tablet 4  . metoCLOPramide (REGLAN) 10 MG tablet TAKE ONE TABLET BY MOUTH ONCE DAILY (Patient taking differently: TAKE ONE TABLET BY MOUTH ONCE DAILY as needed heartburn) 90 tablet 0  . Multiple Vitamins-Minerals (MULTIVITAMIN WITH MINERALS) tablet Take 1 tablet by mouth daily. .    . naproxen (NAPROSYN) 500 MG tablet Take 1 tablet (500 mg total) by mouth 2 (two) times daily. 30 tablet 0  . phentermine (ADIPEX-P) 37.5 MG tablet TAKE ONE TABLET BY MOUTH DAILY 30 tablet 0  . promethazine-dextromethorphan (PROMETHAZINE-DM) 6.25-15 MG/5ML syrup Take 5 mLs by mouth at bedtime.  0  . Spacer/Aero-Holding Chambers (AEROCHAMBER PLUS WITH MASK) inhaler Use as instructed 1 each 2  . traMADol (ULTRAM) 50 MG tablet Take 1 tablet (50 mg total) by mouth every 6 (six) hours as needed for severe pain. 120 tablet 2  . TURMERIC PO Take 1,000 mg by mouth daily.    . valACYclovir (VALTREX) 500 MG tablet Take 500 mg by mouth as needed.    . Vitamin D, Ergocalciferol, (DRISDOL) 50000 units CAPS capsule TAKE ONE CAPSULE BY MOUTH TWICE WEEKLY 8 capsule 3   No current facility-administered medications on file prior to visit.    Medical History:  Past Medical History:  Diagnosis Date  . Allergy   . Anemia   . Anxiety   . Arthritis   . Fibromyalgia   . GERD (gastroesophageal reflux disease)   . Hypertension   . IBS (irritable bowel syndrome)   . Nephrolithiasis   . Plantar fasciitis of right foot    wears boot    Allergies: No Known Allergies   Review of Systems:  Review of Systems  Constitutional: Positive for malaise/fatigue. Negative for chills, diaphoresis, fever and weight loss.  Eyes: Negative.   Respiratory: Negative.   Cardiovascular: Negative.   Gastrointestinal: Negative.    Genitourinary: Negative.   Musculoskeletal: Positive for back pain, joint pain, myalgias and neck pain. Negative for falls.  Skin: Negative.   Neurological: Negative.  Negative for weakness.  Psychiatric/Behavioral: Negative.     Family history- Review and unchanged Social history- Review and unchanged Physical Exam: There were no vitals taken for this visit. Wt Readings from Last 3 Encounters:  10/08/17 233 lb (105.7 kg)  09/25/17 232 lb (105.2 kg)  09/02/17 231 lb (104.8 kg)   General Appearance: Well nourished, in no apparent distress. Eyes: PERRLA, EOMs, conjunctiva no swelling or erythema Sinuses: No Frontal/maxillary tenderness ENT/Mouth: Ext aud canals clear, TMs without erythema, bulging. No erythema, swelling, or exudate on post pharynx.  Tonsils not swollen or erythematous. Hearing normal.  Neck: Supple, thyroid normal.  Respiratory: Respiratory effort normal, BS equal bilaterally without rales,  rhonchi, wheezing or stridor.  Cardio: RRR with no MRGs. Brisk peripheral pulses without edema.  Abdomen: Soft, + BS,  Non tender, no guarding, rebound, hernias, masses. Lymphatics: Non tender without lymphadenopathy.  Musculoskeletal: Full ROM, 5/5 strength, Normal gait Skin: Warm, dry without rashes, lesions, ecchymosis.  Neuro: Cranial nerves intact. Normal muscle tone, no cerebellar symptoms. Psych: Awake and oriented X 3, normal affect, Insight and Judgment appropriate.    Quentin Mulling, PA-C 1:01 PM [facility]

## 2017-12-05 ENCOUNTER — Ambulatory Visit: Payer: Self-pay | Admitting: Physician Assistant

## 2017-12-11 ENCOUNTER — Ambulatory Visit: Payer: Managed Care, Other (non HMO) | Admitting: Obstetrics & Gynecology

## 2017-12-11 ENCOUNTER — Other Ambulatory Visit: Payer: Managed Care, Other (non HMO)

## 2017-12-18 ENCOUNTER — Other Ambulatory Visit: Payer: Self-pay | Admitting: Adult Health

## 2017-12-18 NOTE — Progress Notes (Signed)
FOLLOW UP  Assessment and Plan:   Hypertension Well controlled with current medications  Monitor blood pressure at home; patient to call if consistently greater than 130/80 Continue DASH diet.   Reminder to go to the ER if any CP, SOB, nausea, dizziness, severe HA, changes vision/speech, left arm numbness and tingling and jaw pain.  Cholesterol Currently at goal; continue lifestyle Continue low cholesterol diet and exercise.  Defer lipid panel per patient request  Prediabetes Continue diet and exercise.  Perform daily foot/skin check, notify office of any concerning changes.  Check A1C  Morbid Obesity with co morbidities Long discussion about weight loss, diet, and exercise Recommended diet heavy in fruits and veggies and low in animal meats, cheeses, and dairy products, appropriate calorie intake Discussed ideal weight for height Patient will work on awareness for emotional eating, discussed CBT which she is agreeable to trying, discussed being aware of caloric density, emphasized quality protein intake, resistance exercise to build muscle mass and increase baseline metabolism Patient on phentermine with benefit and no SE, taking drug breaks; continue close follow up. Return in 4 months.   Vitamin D Def Below goal at last visit; she has increased her supplement dose, was taking 16109 IU every other day, ran out of supplement several weeks ago and ran out. Discussed should take biweekly. Will refill.  continue supplementation to maintain goal of 70-100 Defer Vit D level to next visit  Tobacco use Discussed risks associated with tobacco use and advised to reduce or quit Patient is not ready to do so, but advised to consider strongly Will follow up at the next visit  Fibromyalgia Continue follow up with pain management  Increased gabapentin to 300-600 mg TID PRN, this has been helpful Lifestyle discussed: diet/exerise, sleep hygiene, stress management, hydration  Continue  diet and meds as discussed. Further disposition pending results of labs. Discussed med's effects and SE's.   Over 30 minutes of exam, counseling, chart review, and critical decision making was performed.   Future Appointments  Date Time Provider Department Center  12/26/2017  8:15 AM Barbette Merino, NP [facility] None  09/03/2018  9:00 AM Quentin Mulling, PA-C [facility] None    ----------------------------------------------------------------------------------------------------------------------  HPI 52 y.o. female  presents for 3 month follow up on hypertension, cholesterol, prediabetes, morbid obesity and vitamin D deficiency.   Today she reports URI symptoms ongoing for 2 days that began with scratchy/sore throat, started non-productive cough, foggy brain yesterday but no headache, hoarseness. She reports symptoms are progressing. She has been taking OTC dayquil cold/flu which is helpful during the day. She has been gargling with salt water, using vicks.   She has hx of anxiety, chronic pain, DJD and follows with pain clinic, Dr. Laban Emperor, taking cymbalta 60 mg, tramadol.She takes doxepin at night for sleep. She is also prescribed gabapentin though our office 300 mg TID which is effective for most days, but would like to try higher doses on her flare up days.   she currently continues to smoke 0.25 mg pack a day; discussed risks associated with smoking, patient is not ready to quit.   BMI is Body mass index is 37.9 kg/m., she has been working on diet and exercise. She had roux-en-Y in 2013 and lost 80 lb down from 260 to 180 lb.  she is prescribed phentermine for weight loss.  While on the medication they have lost 0 lbs since last visit. They deny palpitations, anxiety, trouble sleeping, elevated BP, has been walking and doing calisthenics. [facility]  [facility] but stopped due to weather. She admits she has been stress eating recently.   BMI is Body mass index is 37.9 kg/m., she  is working on diet and exercise. Wt Readings from Last 3 Encounters:  12/20/17 242 lb (109.8 kg)  10/08/17 233 lb (105.7 kg)  09/25/17 232 lb (105.2 kg)   She has not been checking BP at home due to typically being well controlled, today their BP is BP: 138/76  She does workout. She denies chest pain, shortness of breath, dizziness.   She is not on cholesterol medication. Her cholesterol is at goal. The cholesterol last visit was:   Lab Results  Component Value Date   CHOL 173 09/02/2017   HDL 80 09/02/2017   LDLCALC 77 09/02/2017   TRIG 80 09/02/2017   CHOLHDL 2.2 09/02/2017    She has been working on diet and exercise for prediabetes, and denies increased appetite, nausea, paresthesia of the feet, polydipsia, polyuria, visual disturbances and vomiting. Last A1C in the office was:  Lab Results  Component Value Date   HGBA1C 5.9 (H) 09/02/2017   Patient is taking 16109 every other day on Vitamin D supplement but ran out due to prescription was for 2 per week.    Lab Results  Component Value Date   VD25OH 34 09/02/2017        Current Medications:  Current Outpatient Medications on File Prior to Visit  Medication Sig  . Albuterol Sulfate (PROVENTIL HFA IN) Inhale 108 mcg into the lungs as needed.  . Aspirin-Acetaminophen-Caffeine (EXCEDRIN EXTRA STRENGTH PO) Take 1 tablet by mouth 3 (three) times daily as needed (headache).   Marland Kitchen augmented betamethasone dipropionate (DIPROLENE-AF) 0.05 % ointment Apply topically daily. (Patient taking differently: Apply 1 application topically 3 (three) times daily as needed. )  . betamethasone dipropionate (DIPROLENE) 0.05 % ointment Apply topically 2 (two) times daily.  . cetirizine-pseudoephedrine (ZYRTEC-D) 5-120 MG tablet Take 1 tablet by mouth every 12 (twelve) hours as needed for allergies.   . ciprofloxacin (CILOXAN) 0.3 % ophthalmic solution Place 1-2 drops into both eyes as needed.  . diphenhydrAMINE (BENADRYL) 25 MG tablet Take 25-50 mg  by mouth 3 (three) times daily as needed for allergies.  Marland Kitchen doxepin (SINEQUAN) 25 MG capsule TAKE ONE CAPSULE BY MOUTH 2 HOURS BEFORE BEDTIME AND MAY REPEAT AT HOUR OF SLEEP  . DULoxetine (CYMBALTA) 60 MG capsule TAKE ONE CAPSULE BY MOUTH DAILY  . ferrous sulfate 325 (65 FE) MG EC tablet Take 325 mg by mouth at bedtime.   . Ginkgo Biloba (GINKOBA PO) Take 1 capsule by mouth daily.   . Glucosamine-Chondroitin (COSAMIN DS PO) Take 1,500 mg by mouth daily.  Marland Kitchen lisinopril-hydrochlorothiazide (PRINZIDE,ZESTORETIC) 20-12.5 MG tablet TAKE 1 TABLET BY MOUTH EVERY DAY  . Magnesium 500 MG TABS Take 500 mg by mouth 2 (two) times daily.   . medroxyPROGESTERone (PROVERA) 5 MG tablet Take one tab daily x 10 days. Repeat every 3 mos.  . metoCLOPramide (REGLAN) 10 MG tablet TAKE ONE TABLET BY MOUTH ONCE DAILY (Patient taking differently: TAKE ONE TABLET BY MOUTH ONCE DAILY as needed heartburn)  . Multiple Vitamins-Minerals (MULTIVITAMIN WITH MINERALS) tablet Take 1 tablet by mouth daily. .  . naproxen (NAPROSYN) 500 MG tablet Take 1 tablet (500 mg total) by mouth 2 (two) times daily.  Marland Kitchen Spacer/Aero-Holding Chambers (AEROCHAMBER PLUS WITH MASK) inhaler Use as instructed  . traMADol (ULTRAM) 50 MG tablet Take 1 tablet (50 mg total) by mouth every  6 (six) hours as needed for severe pain.  . TURMERIC PO Take 1,000 mg by mouth daily.  . valACYclovir (VALTREX) 500 MG tablet Take 500 mg by mouth as needed.  . Vitamin D, Ergocalciferol, (DRISDOL) 50000 units CAPS capsule TAKE ONE CAPSULE BY MOUTH TWICE WEEKLY   No current facility-administered medications on file prior to visit.      Allergies: No Known Allergies   Medical History:  Past Medical History:  Diagnosis Date  . Allergy   . Anemia   . Anxiety   . Arthritis   . Fibromyalgia   . GERD (gastroesophageal reflux disease)   . Hypertension   . IBS (irritable bowel syndrome)   . Nephrolithiasis   . Plantar fasciitis of right foot    wears boot     Family history- Reviewed and unchanged Social history- Reviewed and unchanged   Review of Systems:  Review of Systems  Constitutional: Negative for malaise/fatigue and weight loss.  HENT: Negative for hearing loss and tinnitus.   Eyes: Negative for blurred vision and double vision.  Respiratory: Negative for cough, shortness of breath and wheezing.   Cardiovascular: Negative for chest pain, palpitations, orthopnea, claudication and leg swelling.  Gastrointestinal: Negative for abdominal pain, blood in stool, constipation, diarrhea, heartburn, melena, nausea and vomiting.  Genitourinary: Negative.   Musculoskeletal: Positive for joint pain and myalgias.  Skin: Negative for rash.  Neurological: Negative for dizziness, tingling, sensory change, weakness and headaches.  Endo/Heme/Allergies: Negative for polydipsia.  Psychiatric/Behavioral: Negative for depression, substance abuse and suicidal ideas. The patient is not nervous/anxious and does not have insomnia (meds working well).   All other systems reviewed and are negative.   Physical Exam: BP 138/76   Pulse 87   Temp (!) 97.3 F (36.3 C)   Ht 5\' 7"  (1.702 m)   Wt 242 lb (109.8 kg)   SpO2 98%   BMI 37.90 kg/m  Wt Readings from Last 3 Encounters:  12/20/17 242 lb (109.8 kg)  10/08/17 233 lb (105.7 kg)  09/25/17 232 lb (105.2 kg)   General Appearance: Well nourished, in no apparent distress. Eyes: PERRLA, EOMs, conjunctiva no swelling or erythema Sinuses: No Frontal/maxillary tenderness ENT/Mouth: Ext aud canals clear, TMs without erythema, bulging. No erythema, swelling, or exudate on post pharynx.  Tonsils not swollen or erythematous. Hearing normal.  Neck: Supple, thyroid normal.  Respiratory: Respiratory effort normal, BS equal bilaterally without rales, rhonchi, wheezing or stridor.  Cardio: RRR with no MRGs. Brisk peripheral pulses without edema.  Abdomen: Soft, + BS.  Non tender, no guarding, rebound, hernias,  masses. Lymphatics: Non tender without lymphadenopathy.  Musculoskeletal: Full ROM, 5/5 strength, Normal gait Skin: Warm, dry without rashes, lesions, ecchymosis.  Neuro: Cranial nerves intact. No cerebellar symptoms.  Psych: Awake and oriented X 3, normal affect, Insight and Judgment appropriate.    Dan MakerAshley C Yi Falletta, NP 1:05 PM [facility]

## 2017-12-20 ENCOUNTER — Encounter: Payer: Self-pay | Admitting: Adult Health

## 2017-12-20 ENCOUNTER — Ambulatory Visit (INDEPENDENT_AMBULATORY_CARE_PROVIDER_SITE_OTHER): Payer: Managed Care, Other (non HMO) | Admitting: Adult Health

## 2017-12-20 VITALS — BP 138/76 | HR 87 | Temp 97.3°F | Ht 67.0 in | Wt 242.0 lb

## 2017-12-20 DIAGNOSIS — E559 Vitamin D deficiency, unspecified: Secondary | ICD-10-CM | POA: Diagnosis not present

## 2017-12-20 DIAGNOSIS — I10 Essential (primary) hypertension: Secondary | ICD-10-CM | POA: Diagnosis not present

## 2017-12-20 DIAGNOSIS — R7303 Prediabetes: Secondary | ICD-10-CM | POA: Diagnosis not present

## 2017-12-20 DIAGNOSIS — Z720 Tobacco use: Secondary | ICD-10-CM | POA: Diagnosis not present

## 2017-12-20 DIAGNOSIS — M797 Fibromyalgia: Secondary | ICD-10-CM

## 2017-12-20 DIAGNOSIS — Z79899 Other long term (current) drug therapy: Secondary | ICD-10-CM

## 2017-12-20 DIAGNOSIS — E785 Hyperlipidemia, unspecified: Secondary | ICD-10-CM

## 2017-12-20 MED ORDER — GABAPENTIN 300 MG PO CAPS: ORAL_CAPSULE | ORAL | 2 refills | Status: DC

## 2017-12-20 MED ORDER — PHENTERMINE HCL 37.5 MG PO TABS: 37.5000 mg | ORAL_TABLET | Freq: Every day | ORAL | 0 refills | Status: DC

## 2017-12-20 MED ORDER — PROMETHAZINE-DM 6.25-15 MG/5ML PO SYRP: 5.0000 mL | ORAL_SOLUTION | Freq: Every day | ORAL | 0 refills | Status: DC

## 2017-12-20 MED ORDER — BENZONATATE 200 MG PO CAPS: ORAL_CAPSULE | ORAL | 1 refills | Status: DC

## 2017-12-20 NOTE — Patient Instructions (Addendum)
Goals    . DIET - INCREASE WATER INTAKE     65+ fluid ounces daily     . Exercise 150 min/wk Moderate Activity         Are you an emotional eater? Do you eat more when you're feeling stressed? Do you eat when you're not hungry or when you're full? Do you eat to feel better (to calm and soothe yourself when you're sad, mad, bored, anxious, etc.)? Do you reward yourself with food? Do you regularly eat until you've stuffed yourself? Does food make you feel safe? Do you feel like food is a friend? Do you feel powerless or out of control around food?  If you answered yes to some of these questions than it is likely that you are an emotional eater. This is normally a learned behavior and can take time to first recognize the signs and second BREAK THE HABIT. But here is more information and tips to help.   The difference between emotional hunger and physical hunger Emotional hunger can be powerful, so it's easy to mistake it for physical hunger. But there are clues you can look for to help you tell physical and emotional hunger apart.  Emotional hunger comes on suddenly. It hits you in an instant and feels overwhelming and urgent. Physical hunger, on the other hand, comes on more gradually. The urge to eat doesn't feel as dire or demand instant satisfaction (unless you haven't eaten for a very long time).  Emotional hunger craves specific comfort foods. When you're physically hungry, almost anything sounds good-including healthy stuff like vegetables. But emotional hunger craves junk food or sugary snacks that provide an instant rush. You feel like you need cheesecake or pizza, and nothing else will do.  Emotional hunger often leads to mindless eating. Before you know it, you've eaten a whole bag of chips or an entire pint of ice cream without really paying attention or fully enjoying it. When you're eating in response to physical hunger, you're typically more aware of what you're  doing.  Emotional hunger isn't satisfied once you're full. You keep wanting more and more, often eating until you're uncomfortably stuffed. Physical hunger, on the other hand, doesn't need to be stuffed. You feel satisfied when your stomach is full.  Emotional hunger isn't located in the stomach. Rather than a growling belly or a pang in your stomach, you feel your hunger as a craving you can't get out of your head. You're focused on specific textures, tastes, and smells.  Emotional hunger often leads to regret, guilt, or shame. When you eat to satisfy physical hunger, you're unlikely to feel guilty or ashamed because you're simply giving your body what it needs. If you feel guilty after you eat, it's likely because you know deep down that you're not eating for nutritional reasons.  Identify your emotional eating triggers What situations, places, or feelings make you reach for the comfort of food? Most emotional eating is linked to unpleasant feelings, but it can also be triggered by positive emotions, such as rewarding yourself for achieving a goal or celebrating a holiday or happy event. Common causes of emotional eating include:  Stuffing emotions - Eating can be a way to temporarily silence or "stuff down" uncomfortable emotions, including anger, fear, sadness, anxiety, loneliness, resentment, and shame. While you're numbing yourself with food, you can avoid the difficult emotions you'd rather not feel.  Boredom or feelings of emptiness - Do you ever eat simply to give yourself something  to do, to relieve boredom, or as a way to fill a void in your life? You feel unfulfilled and empty, and food is a way to occupy your mouth and your time. In the moment, it fills you up and distracts you from underlying feelings of purposelessness and dissatisfaction with your life.  Childhood habits - Think back to your childhood memories of food. Did your parents reward good behavior with ice cream, take you out  for pizza when you got a good report card, or serve you sweets when you were feeling sad? These habits can often carry over into adulthood. Or your eating may be driven by nostalgia-for cherished memories of grilling burgers in the backyard with your dad or baking and eating cookies with your mom.  Social influences - Getting together with other people for a meal is a great way to relieve stress, but it can also lead to overeating. It's easy to overindulge simply because the food is there or because everyone else is eating. You may also overeat in social situations out of nervousness. Or perhaps your family or circle of friends encourages you to overeat, and it's easier to go along with the group.  Stress - Ever notice how stress makes you hungry? It's not just in your mind. When stress is chronic, as it so often is in our chaotic, fast-paced world, your body produces high levels of the stress hormone, cortisol. Cortisol triggers cravings for salty, sweet, and fried foods-foods that give you a burst of energy and pleasure. The more uncontrolled stress in your life, the more likely you are to turn to food for emotional relief.  Find other ways to feed your feelings If you don't know how to manage your emotions in a way that doesn't involve food, you won't be able to control your eating habits for very long. Diets so often fail because they offer logical nutritional advice which only works if you have conscious control over your eating habits. It doesn't work when emotions hijack the process, demanding an immediate payoff with food.  In order to stop emotional eating, you have to find other ways to fulfill yourself emotionally. It's not enough to understand the cycle of emotional eating or even to understand your triggers, although that's a huge first step. You need alternatives to food that you can turn to for emotional fulfillment.  Alternatives to emotional eating If you're depressed or lonely, call  someone who always makes you feel better, play with your dog or cat, or look at a favorite photo or cherished memento.  If you're anxious, expend your nervous energy by dancing to your favorite song, squeezing a stress ball, or taking a brisk walk.  If you're exhausted, treat yourself with a hot cup of tea, take a bath, light some scented candles, or wrap yourself in a warm blanket.  If you're bored, read a good book, watch a comedy show, explore the outdoors, or turn to an activity you enjoy (woodworking, playing the guitar, shooting hoops, scrapbooking, etc.).  What is mindful eating? Mindful eating is a practice that develops your awareness of eating habits and allows you to pause between your triggers and your actions. Most emotional eaters feel powerless over their food cravings. When the urge to eat hits, you feel an almost unbearable tension that demands to be fed, right now. Because you've tried to resist in the past and failed, you believe that your willpower just isn't up to snuff. But the truth is that you  have more power over your cravings than you think.  Take 5 before you give in to a craving Emotional eating tends to be automatic and virtually mindless. Before you even realize what you're doing, you've reached for a tub of ice cream and polished off half of it. But if you can take a moment to pause and reflect when you're hit with a craving, you give yourself the opportunity to make a different decision.  Can you put off eating for five minutes? Or just start with one minute. Don't tell yourself you can't give in to the craving; remember, the forbidden is extremely tempting. Just tell yourself to wait.  While you're waiting, check in with yourself. How are you feeling? What's going on emotionally? Even if you end up eating, you'll have a better understanding of why you did it. This can help you set yourself up for a different response next time.  How to practice mindful  eating Eating while you're also doing other things-such as watching TV, driving, or playing with your phone-can prevent you from fully enjoying your food. Since your mind is elsewhere, you may not feel satisfied or continue eating even though you're no longer hungry. Eating more mindfully can help focus your mind on your food and the pleasure of a meal and curb overeating.   Eat your meals in a calm place with no distractions, aside from any dining companions.  Try eating with your non-dominant hand or using chopsticks instead of a knife and fork. Eating in such a non-familiar way can slow down how fast you eat and ensure your mind stays focused on your food.  Allow yourself enough time not to have to rush your meal. Set a timer for 20 minutes and pace yourself so you spend at least that much time eating.  Take small bites and chew them well, taking time to notice the different flavors and textures of each mouthful.  Put your utensils down between bites. Take time to consider how you feel-hungry, satiated-before picking up your utensils again.  Try to stop eating before you are full.It takes time for the signal to reach your brain that you've had enough. Don't feel obligated to always clean your plate.  When you've finished your food, take a few moments to assess if you're really still hungry before opting for an extra serving or dessert.  Learn to accept your feelings-even the bad ones  While it may seem that the core problem is that you're powerless over food, emotional eating actually stems from feeling powerless over your emotions. You don't feel capable of dealing with your feelings head on, so you avoid them with food.  Recommended reading  Mini Habits for weight loss  Healthy Eating: A guide to the new nutrition Copy Special Health Report  10 Tips for Mindful Eating - How mindfulness can help you fully enjoy a meal and the experience of eating-with moderation  and restraint. [facility] Health blog)  Weight Loss: Gain Control of Emotional Eating - Tips to regain control of your eating habits. [facility])  Why Stress Causes People to Overeat -Tips on controlling stress eating. [employer])  Mindful Eating Meditations -Free online mindfulness meditations. ([facility] [employer]ful [facility])       Cognitive Behavioral Therapy Cognitive behavioral therapy (CBT) is a short-term, goal-oriented type of talk therapy. CBT can help you:  Identify patterns of thinking, feeling, and behaving that are causing you problems.  Decide how you want  to think, feel, and respond to life events.  Set goals to change the beliefs and thoughts that cause you to act in ways that are not helpful for you.  Follow up on the changes that you make.  What are the different types of CBT? The different types of CBT include:  Dialectical behavioral therapy (DBT). This approach is often used in group therapy, and it aids a person in managing behavior by focusing on: ? Things that cause problems to start (triggers). ? Methods of self-calming. ? Re-evaluating thinking processes.  Mindfulness-based cognitive therapy. This approach involves focusing your attention, meditating, and developing awareness of the present moment (mindfulness).  Rational emotive behavior therapy. This approach uses rational thought to reframe your thinking so it is less judgmental. Your therapist may directly challenge your thought processes.  Stress inoculation training. This approach involves planning ahead for stressful situations by practicing new thoughts and behaviors. This planning can help you avoid going back to old actions.  Acceptance and commitment therapy (ACT). This approach focuses on accepting yourself as you are and practicing mindfulness. It helps you understand what you would like to change and how you can set goals in that direction.  What conditions is CBT used to  treat? CBT may help to treat:  Mental health conditions, including: ? Depression. ? Anxiety. ? Bipolar disorder. ? Eating disorders. ? Post-traumatic stress disorder (PTSD). ? Obsessive-compulsive disorder (OCD).  Insomnia and other sleep disorders.  Pain.  Stress.  Coping with loss or grief.  Coping with a difficult medical diagnosis or illness.  Relationship problems.  Emotional distress or shock (trauma).  How can CBT help me? CBT may:  Give you a chance to share your thoughts, feelings, problems, and fears in a safe space.  Help you focus on specific problems.  Give you homework that helps you put theory into practice. Homework may include keeping a journal or doing thinking exercises.  Help you become aware of your patterns of thinking, feeling, and behaving, and how those three patterns affect each other.  Change your thoughts so that you can change your behaviors.  Help you chose how you want to view the world.  Teach you planned coping skills and offer better ways to deal with stress and difficult situations.  To make the most of CBT, make sure you:  Find a licensed therapist whom you trust.  Take an active part in your therapy and do the homework that you are given.  Are honest about your problems.  Avoid skipping your therapy sessions.  Summary  Cognitive behavioral therapy (CBT) is a short-term, goal-oriented type of talk therapy.  CBT can help you become aware of your patterns and the relationships among your thoughts, feelings, and behavior.  CBT may help mental health conditions and other problems. This information is not intended to replace advice given to you by your health care provider. Make sure you discuss any questions you have with your health care provider. Document Released: 05/15/2016 Document Revised: 05/15/2016 Document Reviewed: 05/15/2016 Elsevier Interactive Patient Education  2018 [employer].

## 2017-12-21 LAB — CBC WITH DIFFERENTIAL/PLATELET
Basophils Absolute: 33 cells/uL (ref 0–200)
Basophils Relative: 0.5 %
Eosinophils Absolute: 59 cells/uL (ref 15–500)
Eosinophils Relative: 0.9 %
HCT: 35.6 % (ref 35.0–45.0)
HEMOGLOBIN: 11.2 g/dL — AB (ref 11.7–15.5)
Lymphs Abs: 1697 cells/uL (ref 850–3900)
MCH: 24.6 pg — AB (ref 27.0–33.0)
MCHC: 31.5 g/dL — ABNORMAL LOW (ref 32.0–36.0)
MCV: 78.2 fL — AB (ref 80.0–100.0)
MPV: 9.9 fL (ref 7.5–12.5)
Monocytes Relative: 10.1 %
NEUTROS PCT: 62.4 %
Neutro Abs: 4056 cells/uL (ref 1500–7800)
PLATELETS: 383 10*3/uL (ref 140–400)
RBC: 4.55 10*6/uL (ref 3.80–5.10)
RDW: 16.1 % — ABNORMAL HIGH (ref 11.0–15.0)
TOTAL LYMPHOCYTE: 26.1 %
WBC: 6.5 10*3/uL (ref 3.8–10.8)
WBCMIX: 657 {cells}/uL (ref 200–950)

## 2017-12-21 LAB — COMPLETE METABOLIC PANEL WITH GFR
AG RATIO: 1.6 (calc) (ref 1.0–2.5)
ALBUMIN MSPROF: 4.1 g/dL (ref 3.6–5.1)
ALT: 13 U/L (ref 6–29)
AST: 15 U/L (ref 10–35)
Alkaline phosphatase (APISO): 161 U/L — ABNORMAL HIGH (ref 33–130)
BILIRUBIN TOTAL: 0.3 mg/dL (ref 0.2–1.2)
BUN: 12 mg/dL (ref 7–25)
CHLORIDE: 105 mmol/L (ref 98–110)
CO2: 28 mmol/L (ref 20–32)
Calcium: 9.1 mg/dL (ref 8.6–10.4)
Creat: 0.63 mg/dL (ref 0.50–1.05)
GFR, Est African American: 120 mL/min/{1.73_m2} (ref >=60)
GFR, Est Non African American: 104 mL/min/{1.73_m2} (ref >=60)
Globulin: 2.5 g/dL (calc) (ref 1.9–3.7)
Glucose, Bld: 91 mg/dL (ref 65–99)
POTASSIUM: 3.9 mmol/L (ref 3.5–5.3)
SODIUM: 140 mmol/L (ref 135–146)
TOTAL PROTEIN: 6.6 g/dL (ref 6.1–8.1)

## 2017-12-21 LAB — HEMOGLOBIN A1C
Hgb A1c MFr Bld: 5.9 % of total Hgb — ABNORMAL HIGH (ref <5.7)
Mean Plasma Glucose: 123 (calc)
eAG (mmol/L): 6.8 (calc)

## 2017-12-21 LAB — TSH: TSH: 0.62 mIU/L

## 2017-12-25 ENCOUNTER — Encounter: Payer: Managed Care, Other (non HMO) | Admitting: Nurse Practitioner

## 2017-12-26 ENCOUNTER — Encounter: Payer: Self-pay | Admitting: Nurse Practitioner

## 2017-12-26 ENCOUNTER — Other Ambulatory Visit: Payer: Self-pay | Admitting: Nurse Practitioner

## 2017-12-26 ENCOUNTER — Other Ambulatory Visit: Payer: Self-pay

## 2017-12-26 ENCOUNTER — Ambulatory Visit: Payer: Managed Care, Other (non HMO) | Attending: Nurse Practitioner | Admitting: Nurse Practitioner

## 2017-12-26 VITALS — BP 141/87 | HR 92 | Temp 98.7°F | Resp 18 | Ht 67.0 in | Wt 242.0 lb

## 2017-12-26 DIAGNOSIS — Z79891 Long term (current) use of opiate analgesic: Secondary | ICD-10-CM | POA: Insufficient documentation

## 2017-12-26 DIAGNOSIS — M797 Fibromyalgia: Secondary | ICD-10-CM | POA: Insufficient documentation

## 2017-12-26 DIAGNOSIS — Z5181 Encounter for therapeutic drug level monitoring: Secondary | ICD-10-CM | POA: Insufficient documentation

## 2017-12-26 DIAGNOSIS — Z7982 Long term (current) use of aspirin: Secondary | ICD-10-CM | POA: Diagnosis not present

## 2017-12-26 DIAGNOSIS — M47816 Spondylosis without myelopathy or radiculopathy, lumbar region: Secondary | ICD-10-CM

## 2017-12-26 DIAGNOSIS — M160 Bilateral primary osteoarthritis of hip: Secondary | ICD-10-CM | POA: Insufficient documentation

## 2017-12-26 DIAGNOSIS — Z79899 Other long term (current) drug therapy: Secondary | ICD-10-CM | POA: Insufficient documentation

## 2017-12-26 DIAGNOSIS — M47812 Spondylosis without myelopathy or radiculopathy, cervical region: Secondary | ICD-10-CM | POA: Insufficient documentation

## 2017-12-26 DIAGNOSIS — G894 Chronic pain syndrome: Secondary | ICD-10-CM | POA: Diagnosis not present

## 2017-12-26 MED ORDER — TRAMADOL HCL 50 MG PO TABS: 50.0000 mg | ORAL_TABLET | Freq: Four times a day (QID) | ORAL | 2 refills | Status: DC | PRN

## 2017-12-26 MED ORDER — MELOXICAM 7.5 MG PO TABS: 7.5000 mg | ORAL_TABLET | Freq: Two times a day (BID) | ORAL | 2 refills | Status: AC

## 2017-12-26 NOTE — Patient Instructions (Addendum)
____________________________________________________________________________________________  Medication Rules  Purpose: To inform patients, and their family members, of our rules and regulations.  Applies to: All patients receiving prescriptions (written or electronic).  Pharmacy of record: Pharmacy where electronic prescriptions will be sent. If written prescriptions are taken to a different pharmacy, please inform the nursing staff. The pharmacy listed in the electronic medical record should be the one where you would like electronic prescriptions to be sent.  Electronic prescriptions: In compliance with the [facility] Strengthen Opioid Misuse Prevention (STOP) Act of 2017 (Session Conni Elliot [phone]), effective January 15, 2018, all controlled substances must be electronically prescribed. Calling prescriptions to the pharmacy will cease to exist.  Prescription refills: Only during scheduled appointments. Applies to all prescriptions.  NOTE: The following applies primarily to controlled substances (Opioid* Pain Medications).   Patient's responsibilities: 1. Pain Pills: Bring all pain pills to every appointment (except for procedure appointments). 2. Pill Bottles: Bring pills in original pharmacy bottle. Always bring the newest bottle. Bring bottle, even if empty. 3. Medication refills: You are responsible for knowing and keeping track of what medications you take and those you need refilled. The day before your appointment: write a list of all prescriptions that need to be refilled. The day of the appointment: give the list to the admitting nurse. Prescriptions will be written only during appointments. If you forget a medication: it will not be "Called in", "Faxed", or "electronically sent". You will need to get another appointment to get these prescribed. No early refills. Do not call asking to have your prescription filled early. 4. Prescription Accuracy: You are responsible for  carefully inspecting your prescriptions before leaving our office. Have the discharge nurse carefully go over each prescription with you, before taking them home. Make sure that your name is accurately spelled, that your address is correct. Check the name and dose of your medication to make sure it is accurate. Check the number of pills, and the written instructions to make sure they are clear and accurate. Make sure that you are given enough medication to last until your next medication refill appointment. 5. Taking Medication: Take medication as prescribed. When it comes to controlled substances, taking less pills or less frequently than prescribed is permitted and encouraged. Never take more pills than instructed. Never take medication more frequently than prescribed.  6. Inform other Doctors: Always inform, all of your healthcare providers, of all the medications you take. 7. Pain Medication from other Providers: You are not allowed to accept any additional pain medication from any other Doctor or Healthcare provider. There are two exceptions to this rule. (see below) In the event that you require additional pain medication, you are responsible for notifying us, as stated below. 8. Medication Agreement: You are responsible for carefully reading and following our Medication Agreement. This must be signed before receiving any prescriptions from our practice. Safely store a copy of your signed Agreement. Violations to the Agreement will result in no further prescriptions. (Additional copies of our Medication Agreement are available upon request.) 9. Laws, Rules, & Regulations: All patients are expected to follow all [address] and [employer], Rules, [place] [employer]. Ignorance of the Laws does not constitute a valid excuse. The use of any illegal substances is prohibited. 10. Adopted CDC guidelines & recommendations: Target dosing levels will be at or below 60 MME/day. Use of benzodiazepines** is not  recommended.  Exceptions: There are only two exceptions to the rule of not receiving pain medications from other Healthcare Providers. 1.  Exception #1 (Emergencies): In the event of an emergency (i.e.: accident requiring emergency care), you are allowed to receive additional pain medication. However, you are responsible for: As soon as you are able, call our office [phone], at any time of the day or night, and leave a message stating your name, the date and nature of the emergency, and the name and dose of the medication prescribed. In the event that your call is answered by a member of our staff, make sure to document and save the date, time, and the name of the person that took your information.  2. Exception #2 (Planned Surgery): In the event that you are scheduled by another doctor or dentist to have any type of surgery or procedure, you are allowed (for a period no longer than 30 days), to receive additional pain medication, for the acute post-op pain. However, in this case, you are responsible for picking up a copy of our "Post-op Pain Management for Surgeons" handout, and giving it to your surgeon or dentist. This document is available at our office, and does not require an appointment to obtain it. Simply go to our office during business hours (Monday-Thursday from 8:00 AM to 4:00 PM) (Friday 8:00 AM to 12:00 Noon) or if you have a scheduled appointment with [place], prior to your surgery, and ask for it by name. In addition, you will need to provide [place] with your name, name of your surgeon, type of surgery, and date of procedure or surgery.  *Opioid medications include: morphine, codeine, oxycodone, oxymorphone, hydrocodone, hydromorphone, meperidine, tramadol, tapentadol, buprenorphine, fentanyl, methadone. **Benzodiazepine medications include: diazepam (Valium), alprazolam (Xanax), clonazepam (Klonopine), lorazepam (Ativan), clorazepate (Tranxene), chlordiazepoxide (Librium), estazolam (Prosom),  oxazepam (Serax), temazepam (Restoril), triazolam (Halcion) (Last updated: 03/14/2017) ____________________________________________________________________________________________    BMI Assessment: Estimated body mass index is 37.9 kg/m as calculated from the following:   Height as of this encounter: 5\' 7"  (1.702 m).   Weight as of this encounter: 242 lb (109.8 kg).  BMI interpretation table: BMI level Category Range association with higher incidence of chronic pain  <18 kg/m2 Underweight   18.5-24.9 kg/m2 Ideal body weight   25-29.9 kg/m2 Overweight Increased incidence by 20%  30-34.9 kg/m2 Obese (Class I) Increased incidence by 68%  35-39.9 kg/m2 Severe obesity (Class II) Increased incidence by 136%  >40 kg/m2 Extreme obesity (Class III) Increased incidence by 254%   Patient's current BMI Ideal Body weight  Body mass index is 37.9 kg/m. Ideal body weight: 61.6 kg (135 lb 12.9 oz) Adjusted ideal body weight: 80.9 kg (178 lb 4.5 oz)   BMI Readings from Last 4 Encounters:  12/26/17 37.90 kg/m  12/20/17 37.90 kg/m  10/08/17 36.49 kg/m  09/25/17 35.28 kg/m   Wt Readings from Last 4 Encounters:  12/26/17 242 lb (109.8 kg)  12/20/17 242 lb (109.8 kg)  10/08/17 233 lb (105.7 kg)  09/25/17 232 lb (105.2 kg)   ______________________________________________________________________________________________  Specialty Pain Scale  Introduction:  There are significant differences in how pain is reported. The word pain usually refers to physical pain, but it is also a common synonym of suffering. The medical community uses a scale from 0 (zero) to 10 (ten) to report pain level. Zero (0) is described as "no pain", while ten (10) is described as "the worse pain you can imagine". The problem with this scale is that physical pain is reported along with suffering. Suffering refers to mental pain, or more often yet it refers to any unpleasant feeling, emotion or aversion  associated with the  perception of harm or threat of harm. It is the psychological component of pain.  Pain Specialists prefer to separate the two components. The pain scale used by this practice is the Verbal Numerical Rating Scale (VNRS-11). This scale is for the physical pain only. DO NOT INCLUDE how your pain psychologically affects you. This scale is for adults 75 years of age and older. It has 11 (eleven) levels. The 1st level is 0/10. This means: "right now, I have no pain". In the context of pain management, it also means: "right now, my physical pain is under control with the current therapy".  General Information:  The scale should reflect your current level of pain. Unless you are specifically asked for the level of your worst pain, or your average pain. If you are asked for one of these two, then it should be understood that it is over the past 24 hours.  Levels 1 (one) through 5 (five) are described below, and can be treated as an outpatient. Ambulatory pain management facilities such as ours are more than adequate to treat these levels. Levels 6 (six) through 10 (ten) are also described below, however, these must be treated as a hospitalized patient. While levels 6 (six) and 7 (seven) may be evaluated at an urgent care facility, levels 8 (eight) through 10 (ten) constitute medical emergencies and as such, they belong in a hospital's emergency department. When having these levels (as described below), do not come to our office. Our facility is not equipped to manage these levels. Go directly to an urgent care facility or an emergency department to be evaluated.  Definitions:  Activities of Daily Living (ADL): Activities of daily living (ADL or ADLs) is a term used in healthcare to refer to people's daily self-care activities. Health professionals often use a person's ability or inability to perform ADLs as a measurement of their functional status, particularly in regard to people post injury, with disabilities and  the elderly. There are two ADL levels: Basic and Instrumental. Basic Activities of Daily Living (BADL  or BADLs) consist of self-care tasks that include: Bathing and showering; personal hygiene and grooming (including brushing/combing/styling hair); dressing; Toilet hygiene (getting to the toilet, cleaning oneself, and getting back up); eating and self-feeding (not including cooking or chewing and swallowing); functional mobility, often referred to as "transferring", as measured by the ability to walk, get in and out of bed, and get into and out of a chair; the broader definition (moving from one place to another while performing activities) is useful for people with different physical abilities who are still able to get around independently. Basic ADLs include the things many people do when they get up in the morning and get ready to go out of the house: get out of bed, go to the toilet, bathe, dress, groom, and eat. On the average, loss of function typically follows a particular order. Hygiene is the first to go, followed by loss of toilet use and locomotion. The last to go is the ability to eat. When there is only one remaining area in which the person is independent, there is a 62.9% chance that it is eating and only a 3.5% chance that it is hygiene. Instrumental Activities of Daily Living (IADL or IADLs) are not necessary for fundamental functioning, but they let an individual live independently in a community. IADL consist of tasks that include: cleaning and maintaining the house; home establishment and maintenance; care of others (including selecting and supervising  caregivers); care of pets; child rearing; managing money; managing financials (investments, etc.); meal preparation and cleanup; shopping for groceries and necessities; moving within the community; safety procedures and emergency responses; health management and maintenance (taking prescribed medications); and using the telephone or other form  of communication.  Instructions:  Most patients tend to report their pain as a combination of two factors, their physical pain and their psychosocial pain. This last one is also known as suffering and it is reflection of how physical pain affects you socially and psychologically. From now on, report them separately.  From this point on, when asked to report your pain level, report only your physical pain. Use the following table for reference.  Pain Clinic Pain Levels (0-5/10)  Pain Level Score  Description  No Pain 0   Mild pain 1 Nagging, annoying, but does not interfere with basic activities of daily living (ADL). Patients are able to eat, bathe, get dressed, toileting (being able to get on and off the toilet and perform personal hygiene functions), transfer (move in and out of bed or a chair without assistance), and maintain continence (able to control bladder and bowel functions). Blood pressure and heart rate are unaffected. A normal heart rate for a healthy adult ranges from 60 to 100 bpm (beats per minute).   Mild to moderate pain 2 Noticeable and distracting. Impossible to hide from other people. More frequent flare-ups. Still possible to adapt and function close to normal. It can be very annoying and may have occasional stronger flare-ups. With discipline, patients may get used to it and adapt.   Moderate pain 3 Interferes significantly with activities of daily living (ADL). It becomes difficult to feed, bathe, get dressed, get on and off the toilet or to perform personal hygiene functions. Difficult to get in and out of bed or a chair without assistance. Very distracting. With effort, it can be ignored when deeply involved in activities.   Moderately severe pain 4 Impossible to ignore for more than a few minutes. With effort, patients may still be able to manage work or participate in some social activities. Very difficult to concentrate. Signs of autonomic nervous system discharge are  evident: dilated pupils (mydriasis); mild sweating (diaphoresis); sleep interference. Heart rate becomes elevated (>115 bpm). Diastolic blood pressure (lower number) rises above 100 mmHg. Patients find relief in laying down and not moving.   Severe pain 5 Intense and extremely unpleasant. Associated with frowning face and frequent crying. Pain overwhelms the senses.  Ability to do any activity or maintain social relationships becomes significantly limited. Conversation becomes difficult. Pacing back and forth is common, as getting into a comfortable position is nearly impossible. Pain wakes you up from deep sleep. Physical signs will be obvious: pupillary dilation; increased sweating; goosebumps; brisk reflexes; cold, clammy hands and feet; nausea, vomiting or dry heaves; loss of appetite; significant sleep disturbance with inability to fall asleep or to remain asleep. When persistent, significant weight loss is observed due to the complete loss of appetite and sleep deprivation.  Blood pressure and heart rate becomes significantly elevated. Caution: If elevated blood pressure triggers a pounding headache associated with blurred vision, then the patient should immediately seek attention at an urgent or emergency care unit, as these may be signs of an impending stroke.    Emergency Department Pain Levels (6-10/10)  Emergency Room Pain 6 Severely limiting. Requires emergency care and should not be seen or managed at an outpatient pain management facility. Communication becomes difficult and requires  great effort. Assistance to reach the emergency department may be required. Facial flushing and profuse sweating along with potentially dangerous increases in heart rate and blood pressure will be evident.   Distressing pain 7 Self-care is very difficult. Assistance is required to transport, or use restroom. Assistance to reach the emergency department will be required. Tasks requiring coordination, such as  bathing and getting dressed become very difficult.   Disabling pain 8 Self-care is no longer possible. At this level, pain is disabling. The individual is unable to do even the most basic activities such as walking, eating, bathing, dressing, transferring to a bed, or toileting. Fine motor skills are lost. It is difficult to think clearly.   Incapacitating pain 9 Pain becomes incapacitating. Thought processing is no longer possible. Difficult to remember your own name. Control of movement and coordination are lost.   The worst pain imaginable 10 At this level, most patients pass out from pain. When this level is reached, collapse of the autonomic nervous system occurs, leading to a sudden drop in blood pressure and heart rate. This in turn results in a temporary and dramatic drop in blood flow to the brain, leading to a loss of consciousness. Fainting is one of the bodys self defense mechanisms. Passing out puts the brain in a calmed state and causes it to shut down for a while, in order to begin the healing process.    Summary: 1. Refer to this scale when providing [place] with your pain level. 2. Be accurate and careful when reporting your pain level. This will help with your care. 3. Over-reporting your pain level will lead to loss of credibility. 4. Even a level of 1/10 means that there is pain and will be treated at our facility. 5. High, inaccurate reporting will be documented as Symptom Exaggeration, leading to loss of credibility and suspicions of possible secondary gains such as obtaining more narcotics, or wanting to appear disabled, for fraudulent reasons. 6. Only pain levels of 5 or below will be seen at our facility. 7. Pain levels of 6 and above will be sent to the Emergency Department and the appointment  cancelled. ______________________________________________________________________________________________   ____________________________________________________________________________________________  Medication Rules  Purpose: To inform patients, and their family members, of our rules and regulations.  Applies to: All patients receiving prescriptions (written or electronic).  Pharmacy of record: Pharmacy where electronic prescriptions will be sent. If written prescriptions are taken to a different pharmacy, please inform the nursing staff. The pharmacy listed in the electronic medical record should be the one where you would like electronic prescriptions to be sent.  Electronic prescriptions: In compliance with the [facility] Strengthen Opioid Misuse Prevention (STOP) Act of 2017 (Session Conni Elliot [phone]), effective January 15, 2018, all controlled substances must be electronically prescribed. Calling prescriptions to the pharmacy will cease to exist.  Prescription refills: Only during scheduled appointments. Applies to all prescriptions.  NOTE: The following applies primarily to controlled substances (Opioid* Pain Medications).   Patient's responsibilities: 11. Pain Pills: Bring all pain pills to every appointment (except for procedure appointments). 12. Pill Bottles: Bring pills in original pharmacy bottle. Always bring the newest bottle. Bring bottle, even if empty. 13. Medication refills: You are responsible for knowing and keeping track of what medications you take and those you need refilled. The day before your appointment: write a list of all prescriptions that need to be refilled. The day of the appointment: give the list to the admitting nurse. Prescriptions will be written only  during appointments. If you forget a medication: it will not be "Called in", "Faxed", or "electronically sent". You will need to get another appointment to get these prescribed. No early refills. Do  not call asking to have your prescription filled early. 14. Prescription Accuracy: You are responsible for carefully inspecting your prescriptions before leaving our office. Have the discharge nurse carefully go over each prescription with you, before taking them home. Make sure that your name is accurately spelled, that your address is correct. Check the name and dose of your medication to make sure it is accurate. Check the number of pills, and the written instructions to make sure they are clear and accurate. Make sure that you are given enough medication to last until your next medication refill appointment. 15. Taking Medication: Take medication as prescribed. When it comes to controlled substances, taking less pills or less frequently than prescribed is permitted and encouraged. Never take more pills than instructed. Never take medication more frequently than prescribed.  16. Inform other Doctors: Always inform, all of your healthcare providers, of all the medications you take. 17. Pain Medication from other Providers: You are not allowed to accept any additional pain medication from any other Doctor or Healthcare provider. There are two exceptions to this rule. (see below) In the event that you require additional pain medication, you are responsible for notifying us, as stated below. 18. Medication Agreement: You are responsible for carefully reading and following our Medication Agreement. This must be signed before receiving any prescriptions from our practice. Safely store a copy of your signed Agreement. Violations to the Agreement will result in no further prescriptions. (Additional copies of our Medication Agreement are available upon request.) 19. Laws, Rules, & Regulations: All patients are expected to follow all [address] and [employer], Rules, [place] [employer]. Ignorance of the Laws does not constitute a valid excuse. The use of any illegal substances is prohibited. 20. Adopted CDC  guidelines & recommendations: Target dosing levels will be at or below 60 MME/day. Use of benzodiazepines** is not recommended.  Exceptions: There are only two exceptions to the rule of not receiving pain medications from other Healthcare Providers. 3. Exception #1 (Emergencies): In the event of an emergency (i.e.: accident requiring emergency care), you are allowed to receive additional pain medication. However, you are responsible for: As soon as you are able, call our office [phone], at any time of the day or night, and leave a message stating your name, the date and nature of the emergency, and the name and dose of the medication prescribed. In the event that your call is answered by a member of our staff, make sure to document and save the date, time, and the name of the person that took your information.  4. Exception #2 (Planned Surgery): In the event that you are scheduled by another doctor or dentist to have any type of surgery or procedure, you are allowed (for a period no longer than 30 days), to receive additional pain medication, for the acute post-op pain. However, in this case, you are responsible for picking up a copy of our "Post-op Pain Management for Surgeons" handout, and giving it to your surgeon or dentist. This document is available at our office, and does not require an appointment to obtain it. Simply go to our office during business hours (Monday-Thursday from 8:00 AM to 4:00 PM) (Friday 8:00 AM to 12:00 Noon) or if you have a scheduled appointment with [place], prior to your surgery, and  ask for it by name. In addition, you will need to provide us with your name, name of your surgeon, type of surgery, and date of procedure or surgery.  *Opioid medications include: morphine, codeine, oxycodone, oxymorphone, hydrocodone, hydromorphone, meperidine, tramadol, tapentadol, buprenorphine, fentanyl, methadone. **Benzodiazepine medications include: diazepam (Valium), alprazolam (Xanax),  clonazepam (Klonopine), lorazepam (Ativan), clorazepate (Tranxene), chlordiazepoxide (Librium), estazolam (Prosom), oxazepam (Serax), temazepam (Restoril), triazolam (Halcion) (Last updated: 03/14/2017) ____________________________________________________________________________________________   ____________________________________________________________________________________________  Drug Holidays (Rapid)  Definitions Tolerance: defined as the progressively decreased responsiveness to a drug. Occurs when the drug is used repeatedly and the body adapts to the continued presence of the drug. As a result, a larger dose of the drug is needed to achieve the effect originally obtained by a smaller dose. It is thought to be due to the formation of excess opioid receptors.  Drug Holiday: is when a patient stops taking a medication(s) for a period of time; anywhere from a few days to several weeks.  Withdrawals: refers to the wide range of symptoms that occur after stopping or dramatically reducing opiate drugs after heavy and prolonged use. Withdrawal symptoms do not occur to patients that use low dose opioids, or those who take the medication sporadically. Contrary to benzodiazepine (example: Valium, Xanax, etc.) or alcohol withdrawals (Delirium Tremens), opioid withdrawals are not lethal. Withdrawals are the physical manifestation of the body getting rid of the excess receptors.  Purpose To eliminate tolerance.  Duration of Holiday 14 consecutive days. (2 weeks)  Expected Symptoms Early symptoms of withdrawal include:  Agitation  Anxiety  Muscle aches  Increased tearing  Insomnia  Runny nose  Sweating  Yawning  Late symptoms of withdrawal include:  Abdominal cramping  Diarrhea  Dilated pupils  Goose bumps  Nausea  Vomiting  Opioid withdrawal reactions are very uncomfortable but are not life-threatening. Symptoms usually start within 12 hours of last opioid  dose and within 30 hours of last methadone exposure.  Duration of Symptoms 48 to 72 hours for short acting medications and 2 to 14 days for methadone.  Treatment  Clonidine (Catapres) or tizanidine (Zanaflex) for agitation, sweating, tearing, runny nose.  Promethazine (Phenergan) for nausea, vomiting.  NSAIDs for pain.  Benefits  Improved effectiveness of opioids.  Decreased opioid dose needed to achieve benefits.  Improved pain with lesser dose. ____________________________________________________________________________________________ Information and CD given on [employer]. Pateint to call and schedule trial if interested.

## 2017-12-26 NOTE — Progress Notes (Signed)
Nursing Pain Medication Assessment:  Safety precautions to be maintained throughout the outpatient stay will include: orient to surroundings, keep bed in low position, maintain call bell within reach at all times, provide assistance with transfer out of bed and ambulation.  Medication Inspection Compliance: Pill count conducted under aseptic conditions, in front of the patient. Neither the pills nor the bottle was removed from the patient's sight at any time. Once count was completed pills were immediately returned to the patient in their original bottle.  Medication: Tramadol (Ultram) Pill/Patch Count: 0 of 120 pills remain Pill/Patch Appearance: Markings consistent with prescribed medication Bottle Appearance: Standard pharmacy container. Clearly labeled. Filled Date: 5811 / 11 / 2019 Last Medication intake:  Yesterday

## 2017-12-26 NOTE — Progress Notes (Signed)
Patient's Name: Wendy Valencia  MRN: [id number]  Referring Provider: Vicie Mutters, PA-C  DOB: Dec 05, 1965  PCP: Vicie Mutters, PA-C  DOS: 12/26/2017  Note by: Vevelyn Francois NP  Service setting: Ambulatory outpatient  Specialty: Interventional Pain Management  Location: [facility] (AMB) Pain Management Facility    Patient type: Established    Primary Reason(s) for Visit: Encounter for prescription drug management. (Level of risk: moderate)  CC: Hip Pain (bilateral) and Leg Pain (lateral and posterior thigh bilaterally)  HPI  Ms. Stanczak is a 52 y.o. year old, female patient, who comes today for a medication management evaluation. She has Anxiety state; Essential hypertension; Tobacco abuse; Morbid obesity ([place]); Prediabetes; Insomnia; Medication management; Vitamin D deficiency; S/P gastric bypass; Cervical spondylosis without myelopathy; Fibromyalgia affecting multiple sites (Secondary Area of Pain); Spondylosis of lumbar spine; Chronic generalized pain (Primary Area of Pain); Chronic pain syndrome; Long term current use of opiate analgesic; Chronic shoulder pain (Tertiary Area of Pain) (Bilateral) (R>L); Chronic hip pain (Fourth Area of Pain) (Bilateral) (R>L); Greater trochanteric bursitis of hips (Bilateral); and Osteoarthritis of hips (Bilateral) on their problem list. Her primarily concern today is the Hip Pain (bilateral) and Leg Pain (lateral and posterior thigh bilaterally)  Pain Assessment: Location: Right, Left Hip Radiating: legs Onset: More than a month ago Duration: Chronic pain Quality: Stabbing, Sharp, Aching, Dull Severity: 8 /10 (subjective, self-reported pain score)  Note: Reported level is compatible with observation. Clinically the patient looks like a 2/10 A 2/10 is viewed as "Mild to Moderate" and described as noticeable and distracting. Impossible to hide from other people. More frequent flare-ups. Still possible to adapt and function close to normal. It can be very  annoying and may have occasional stronger flare-ups. With discipline, patients may get used to it and adapt. Information on the proper use of the pain scale provided to the patient today. When using our objective Pain Scale, levels between 6 and 10/10 are said to belong in an emergency room, as it progressively worsens from a 6/10, described as severely limiting, requiring emergency care not usually available at an outpatient pain management facility. At a 6/10 level, communication becomes difficult and requires great effort. Assistance to reach the emergency department may be required. Facial flushing and profuse sweating along with potentially dangerous increases in heart rate and blood pressure will be evident. Effect on ADL:   Timing: Constant Modifying factors: medications, rest BP: (!) 141/87   HR: 92  Ms. Mchugh was last scheduled for an appointment on 09/25/2017 for medication management. During today's appointment we reviewed Ms. Rader's chronic pain status, as well as her outpatient medication regimen. She admits that her injection has worn off. She has gained weight. She does not feel like the Tramadol is  effective. She admits that the weather makes the pain worse. She used a TENs unit which was effective during therapy however once removed the pain returned. She continues to work full time but this is getting difficult. She would like something to her pain. She is getting more pressure in her buttocks.  The patient  reports no history of drug use. Her body mass index is 37.9 kg/m.  Further details on both, my assessment(s), as well as the proposed treatment plan, please see below.  Controlled Substance Pharmacotherapy Assessment REMS (Risk Evaluation and Mitigation Strategy)  Analgesic:tramadol 50 mg 2 tablets twice daily (fill date 04/01/2017) tramadol 200 mg per day MME/day:33m/day SHart Rochester RN  12/26/2017  9:01 AM  Sign when Signing  Visit Nursing Pain  Medication Assessment:  Safety precautions to be maintained throughout the outpatient stay will include: orient to surroundings, keep bed in low position, maintain call bell within reach at all times, provide assistance with transfer out of bed and ambulation.  Medication Inspection Compliance: Pill count conducted under aseptic conditions, in front of the patient. Neither the pills nor the bottle was removed from the patient's sight at any time. Once count was completed pills were immediately returned to the patient in their original bottle.  Medication: Tramadol (Ultram) Pill/Patch Count: 0 of 120 pills remain Pill/Patch Appearance: Markings consistent with prescribed medication Bottle Appearance: Standard pharmacy container. Clearly labeled. Filled Date: 17 / 11 / 2019 Last Medication intake:  Yesterday   Pharmacokinetics: Liberation and absorption (onset of action): WNL Distribution (time to peak effect): WNL Metabolism and excretion (duration of action): WNL         Pharmacodynamics: Desired effects: Analgesia: Ms. Palladino reports >50% benefit. Functional ability: Patient reports that medication allows her to accomplish basic ADLs Clinically meaningful improvement in function (CMIF): Sustained CMIF goals met Perceived effectiveness: Described as relatively effective but with some room for improvement Undesirable effects: Side-effects or Adverse reactions: None reported Monitoring: [place] PMP: Online review of the past 70-monthperiod conducted. Compliant with practice rules and regulations Last UDS on record: Summary  Date Value Ref Range Status  04/15/2017 FINAL  Final    Comment:    ==================================================================== TOXAS[facility]RE COMP DRUG ANALYSIS,UR ==================================================================== Test                             Result       Flag       Units Drug Present and Declared for Prescription Verification    Lorazepam                      1185         EXPECTED   ng/mg creat    Source of lorazepam is a scheduled prescription medication.   Phentermine                    PRESENT      EXPECTED   Tramadol                       >4274        EXPECTED   ng/mg creat   O-Desmethyltramadol            >4274        EXPECTED   ng/mg creat   N-Desmethyltramadol            >4274        EXPECTED   ng/mg creat    Source of tramadol is a prescription medication.    O-desmethyltramadol and N-desmethyltramadol are expected    metabolites of tramadol.   Gabapentin                     PRESENT      EXPECTED   Duloxetine                     PRESENT      EXPECTED   Acetaminophen                  PRESENT      EXPECTED   Diphenhydramine  PRESENT      EXPECTED Drug Absent but Declared for Prescription Verification   Ephedrine/Pseudoephedrine      Not Detected UNEXPECTED   Salicylate                     Not Detected UNEXPECTED    Aspirin, as indicated in the declared medication list, is not    always detected even when used as directed. ==================================================================== Test                      Result    Flag   Units      Ref Range   Creatinine              117              mg/dL      >=20 ==================================================================== Declared Medications:  The flagging and interpretation on this report are based on the  following declared medications.  Unexpected results may arise from  inaccuracies in the declared medications.  **Note: The testing scope of this panel includes these medications:  Diphenhydramine (Benadryl)  Duloxetine (Cymbalta)  Gabapentin (Neurontin)  Lorazepam  Phentermine  Pseudoephedrine (Zyrtec D)  Tramadol  **Note: The testing scope of this panel does not include small to  moderate amounts of these reported medications:  Acetaminophen  Aspirin  **Note: The testing scope of this panel does not include following   reported medications:  Betamethasone  Caffeine  Cetirizine (Zyrtec D)  Hydrochlorothiazide (Lisinopril-HCTZ)  Iron (Ferrous Sulfate)  Lisinopril (Lisinopril-HCTZ)  Magnesium  Metoclopramide  Multivitamin  Supplement  Vitamin D2 (Drisdol) ==================================================================== For clinical consultation, please call [phone]. ====================================================================    UDS interpretation: Compliant          Medication Assessment Form: Reviewed. Patient indicates being compliant with therapy Treatment compliance: Compliant Risk Assessment Profile: Aberrant behavior: See prior evaluations. None observed or detected today Comorbid factors increasing risk of overdose: See prior notes. No additional risks detected today Opioid risk tool (ORT) (Total Score): 0 Personal History of Substance Abuse (SUD-Substance use disorder):  Alcohol: Negative  Illegal Drugs: Negative  Rx Drugs: Negative  ORT Risk Level calculation: Low Risk Risk of substance use disorder (SUD): Low Opioid Risk Tool - 12/26/17 0908      Family History of Substance Abuse   Alcohol  Negative    Illegal Drugs  Negative    Rx Drugs  Negative      Personal History of Substance Abuse   Alcohol  Negative    Illegal Drugs  Negative    Rx Drugs  Negative      Age   Age between 66-45 years   No      History of Preadolescent Sexual Abuse   History of Preadolescent Sexual Abuse  Negative or Female      Psychological Disease   Psychological Disease  Negative    Depression  Negative      Total Score   Opioid Risk Tool Scoring  0    Opioid Risk Interpretation  Low Risk      ORT Scoring interpretation table:  Score <3 = Low Risk for SUD  Score between 4-7 = Moderate Risk for SUD  Score >8 = High Risk for Opioid Abuse   Risk Mitigation Strategies:  Patient Counseling: Covered Patient-Prescriber Agreement (PPA): Present and active  Notification to  other healthcare providers: Done  Pharmacologic Plan: No change in therapy, at this time.  Laboratory Chemistry  Inflammation Markers (CRP: Acute Phase) (ESR: Chronic Phase) Lab Results  Component Value Date   CRP 0.9 03/04/2017   ESRSEDRATE 22 (H) 03/04/2017                         Rheumatology Markers No results found for: RF, ANA, LABURIC, URICUR, LYMEIGGIGMAB, LYMEABIGMQN, HLAB27                      Renal Function Markers Lab Results  Component Value Date   BUN 12 12/20/2017   CREATININE 0.63 88/50/2774   BCR NOT APPLICABLE 12/87/8676   GFRAA 120 12/20/2017   GFRNONAA 104 12/20/2017                             Hepatic Function Markers Lab Results  Component Value Date   AST 15 12/20/2017   ALT 13 12/20/2017   ALBUMIN 4.5 03/05/2016   ALKPHOS 123 (H) 03/05/2016                        Electrolytes Lab Results  Component Value Date   NA 140 12/20/2017   K 3.9 12/20/2017   CL 105 12/20/2017   CALCIUM 9.1 12/20/2017   MG 2.0 09/02/2017                        Neuropathy Markers Lab Results  Component Value Date   VITAMINB12 693 09/02/2017   HGBA1C 5.9 (H) 12/20/2017   HIV NON REACTIVE 02/16/2013                        CNS Tests No results found for: COLORCSF, APPEARCSF, RBCCOUNTCSF, WBCCSF, POLYSCSF, LYMPHSCSF, EOSCSF, PROTEINCSF, GLUCCSF, JCVIRUS, CSFOLI, IGGCSF                      Bone Pathology Markers Lab Results  Component Value Date   VD25OH 34 09/02/2017                         Coagulation Parameters Lab Results  Component Value Date   PLT 383 12/20/2017   DDIMER 0.36 09/12/2012                        Cardiovascular Markers Lab Results  Component Value Date   TROPONINI <0.30 09/12/2012   HGB 11.2 (L) 12/20/2017   HCT 35.6 12/20/2017                         CA Markers Lab Results  Component Value Date   CA125 11 10/08/2017                        Note: Lab results reviewed.  Recent Diagnostic Imaging Results  US  PELVIC COMPLETE WITH TRANSVAGINAL CLINICAL DATA:  Postmenopausal bleeding.  EXAM: TRANSABDOMINAL AND TRANSVAGINAL ULTRASOUND OF PELVIS  TECHNIQUE: Both transabdominal and transvaginal ultrasound examinations of the pelvis were performed. Transabdominal technique was performed for global imaging of the pelvis including uterus, ovaries, adnexal regions, and pelvic cul-de-sac. It was necessary to proceed with endovaginal exam following the transabdominal exam to visualize the endometrium and ovaries.  COMPARISON:  None  FINDINGS: Uterus  Measurements: 9.6 x 4.6 x 6.1 cm.  No discrete mass.  Endometrium  Thickness: 6.8 mm.  No focal abnormality visualized.  Right ovary  Measurements: 3.2 x 2.3 x 2.7 cm. Normal appearance/no adnexal mass.  Left ovary  Measurements: 3.6 x 3.5 x 3.0 cm. There is a cyst in the left ovary measuring 3.4 x 2.6 x 2.3 cm. This cyst contains a single septation which demonstrates blood flow. No solid nodular components.  Other findings  No abnormal free fluid.  IMPRESSION: 1. Endometrial thickening given postmenopausal state. In the setting of post-menopausal bleeding, endometrial sampling is indicated to exclude carcinoma. If results are benign, sonohysterogram should be considered for focal lesion work-up. (Ref: Radiological Reasoning: Algorithmic Workup of Abnormal Vaginal Bleeding with Endovaginal Sonography and Sonohysterography. AJR 2008; 297:L89-21) 2. 3.4 x 2.6 x 2.3 cm septated cyst in the left ovary. There is a single septation of borderline thickness. There is blood flow within the septation. Recommend gynecologic consultation. An MRI could further assess as clinically warranted.  These results will be called to the ordering clinician or representative by the Radiologist Assistant, and communication documented in the PACS or zVision Dashboard.  Electronically Signed   By: Dorise Bullion III M.D   On: 09/21/2017  12:26  Complexity Note: Imaging results reviewed. Results shared with Ms. Bednarczyk, using Layman's terms.                         Meds   Current Outpatient Medications:    Albuterol Sulfate (PROVENTIL HFA IN), Inhale 108 mcg into the lungs as needed., Disp: , Rfl:    Aspirin-Acetaminophen-Caffeine (EXCEDRIN EXTRA STRENGTH PO), Take 1 tablet by mouth 3 (three) times daily as needed (headache). , Disp: , Rfl:    augmented betamethasone dipropionate (DIPROLENE-AF) 0.05 % ointment, Apply topically daily. (Patient taking differently: Apply 1 application topically 3 (three) times daily as needed. ), Disp: 50 g, Rfl: 3   benzonatate (TESSALON) 200 MG capsule, Take 1 tab up to 3 times a day as needed for cough., Disp: 60 capsule, Rfl: 1   betamethasone dipropionate (DIPROLENE) 0.05 % ointment, Apply topically 2 (two) times daily., Disp: 30 g, Rfl: 0   cetirizine-pseudoephedrine (ZYRTEC-D) 5-120 MG tablet, Take 1 tablet by mouth every 12 (twelve) hours as needed for allergies. , Disp: , Rfl:    ciprofloxacin (CILOXAN) 0.3 % ophthalmic solution, Place 1-2 drops into both eyes as needed., Disp: , Rfl: 0   diphenhydrAMINE (BENADRYL) 25 MG tablet, Take 25-50 mg by mouth 3 (three) times daily as needed for allergies., Disp: , Rfl:    doxepin (SINEQUAN) 25 MG capsule, TAKE ONE CAPSULE BY MOUTH 2 HOURS BEFORE BEDTIME AND MAY REPEAT AT HOUR OF SLEEP, Disp: 30 capsule, Rfl: 0   DULoxetine (CYMBALTA) 60 MG capsule, TAKE ONE CAPSULE BY MOUTH DAILY, Disp: 30 capsule, Rfl: 0   ferrous sulfate 325 (65 FE) MG EC tablet, Take 325 mg by mouth at bedtime. , Disp: , Rfl:    gabapentin (NEURONTIN) 300 MG capsule, Take 1-2 caps up to three times a day as needed for pain., Disp: 180 capsule, Rfl: 2   Ginkgo Biloba (GINKOBA PO), Take 1 capsule by mouth daily. , Disp: , Rfl:    Glucosamine-Chondroitin (COSAMIN DS PO), Take 1,500 mg by mouth daily., Disp: , Rfl:    lisinopril-hydrochlorothiazide  (PRINZIDE,ZESTORETIC) 20-12.5 MG tablet, TAKE 1 TABLET BY MOUTH EVERY DAY, Disp: 30 tablet, Rfl: 2   Magnesium 500 MG TABS, Take 500 mg by mouth 2 (two) times  daily. , Disp: , Rfl:    medroxyPROGESTERone (PROVERA) 5 MG tablet, Take one tab daily x 10 days. Repeat every 3 mos., Disp: 10 tablet, Rfl: 4   metoCLOPramide (REGLAN) 10 MG tablet, TAKE ONE TABLET BY MOUTH ONCE DAILY (Patient taking differently: TAKE ONE TABLET BY MOUTH ONCE DAILY as needed heartburn), Disp: 90 tablet, Rfl: 0   Multiple Vitamins-Minerals (MULTIVITAMIN WITH MINERALS) tablet, Take 1 tablet by mouth daily. ., Disp: , Rfl:    naproxen (NAPROSYN) 500 MG tablet, Take 1 tablet (500 mg total) by mouth 2 (two) times daily., Disp: 30 tablet, Rfl: 0   phentermine (ADIPEX-P) 37.5 MG tablet, Take 1 tablet (37.5 mg total) by mouth daily., Disp: 30 tablet, Rfl: 0   promethazine-dextromethorphan (PROMETHAZINE-DM) 6.25-15 MG/5ML syrup, Take 5 mLs by mouth at bedtime., Disp: 118 mL, Rfl: 0   Spacer/Aero-Holding Chambers (AEROCHAMBER PLUS WITH MASK) inhaler, Use as instructed, Disp: 1 each, Rfl: 2   traMADol (ULTRAM) 50 MG tablet, Take 1 tablet (50 mg total) by mouth every 6 (six) hours as needed for severe pain., Disp: 120 tablet, Rfl: 2   TURMERIC PO, Take 1,000 mg by mouth daily., Disp: , Rfl:    valACYclovir (VALTREX) 500 MG tablet, Take 500 mg by mouth as needed., Disp: , Rfl:    Vitamin D, Ergocalciferol, (DRISDOL) 50000 units CAPS capsule, TAKE ONE CAPSULE BY MOUTH TWICE WEEKLY, Disp: 8 capsule, Rfl: 3   meloxicam (MOBIC) 7.5 MG tablet, Take 1 tablet (7.5 mg total) by mouth 2 (two) times daily., Disp: 60 tablet, Rfl: 2  ROS  Constitutional: Denies any fever or chills Gastrointestinal: No reported hemesis, hematochezia, vomiting, or acute GI distress Musculoskeletal: Denies any acute onset joint swelling, redness, loss of ROM, or weakness Neurological: No reported episodes of acute onset apraxia, aphasia, dysarthria,  agnosia, amnesia, paralysis, loss of coordination, or loss of consciousness  Allergies  Ms. Hellmer has No Known Allergies.  PFSH  Drug: Ms. Ostrovsky  reports no history of drug use. Alcohol:  reports current alcohol use of about 1.0 standard drinks of alcohol per week. Tobacco:  reports that she has been smoking cigarettes. She has a 1.75 pack-year smoking history. She has never used smokeless tobacco. Medical:  has a past medical history of Allergy, Anemia, Anxiety, Arthritis, Fibromyalgia, GERD (gastroesophageal reflux disease), Hypertension, IBS (irritable bowel syndrome), Nephrolithiasis, and Plantar fasciitis of right foot. Surgical: Ms. Rinck  has a past surgical history that includes Left shoulder arthroscopic surgery (yrs ago); tummy tuck (2005); Breath tek h pylori (07/11/2011); Gastric Roux-En-Y (10/01/2011); Cholecystectomy (10/01/2011); and Colonoscopy. Family: family history includes Breast cancer (age of onset: 22) in her mother; Breast cancer (age of onset: 65) in her maternal aunt; Cancer in her maternal grandmother; Cancer (age of onset: 5) in her mother; Heart attack in her maternal grandfather and paternal grandfather; Heart disease in her father and maternal grandfather; Hypertension in her father; Lung cancer in her maternal grandmother; Multiple sclerosis in her maternal uncle; Obesity in her brother; Stroke in her father.  Constitutional Exam  General appearance: Well nourished, well developed, and well hydrated. In no apparent acute distress Vitals:   12/26/17 0901  BP: (!) 141/87  Pulse: 92  Resp: 18  Temp: 98.7 F (37.1 C)  TempSrc: Oral  SpO2: 100%  Weight: 242 lb (109.8 kg)  Height: 5' 7" (1.702 m)  Psych/Mental status: Alert, oriented x 3 (person, place, & time)       Eyes: PERLA Respiratory: No evidence of acute respiratory  distress  Cervical Spine Area Exam  Skin & Axial Inspection: No masses, redness, edema, swelling, or associated skin  lesions Alignment: Symmetrical Functional ROM: Unrestricted ROM      Stability: No instability detected Muscle Tone/Strength: Functionally intact. No obvious neuro-muscular anomalies detected. Sensory (Neurological): Unimpaired Palpation: No palpable anomalies              Upper Extremity (UE) Exam    Side: Right upper extremity  Side: Left upper extremity  Skin & Extremity Inspection: Skin color, temperature, and hair growth are WNL. No peripheral edema or cyanosis. No masses, redness, swelling, asymmetry, or associated skin lesions. No contractures.  Skin & Extremity Inspection: Skin color, temperature, and hair growth are WNL. No peripheral edema or cyanosis. No masses, redness, swelling, asymmetry, or associated skin lesions. No contractures.  Functional ROM: Unrestricted ROM          Functional ROM: Unrestricted ROM          Muscle Tone/Strength: Functionally intact. No obvious neuro-muscular anomalies detected.  Muscle Tone/Strength: Functionally intact. No obvious neuro-muscular anomalies detected.  Sensory (Neurological): Unimpaired          Sensory (Neurological): Unimpaired          Palpation: No palpable anomalies              Palpation: No palpable anomalies               Thoracic Spine Area Exam  Skin & Axial Inspection: No masses, redness, or swelling Alignment: Symmetrical Functional ROM: Unrestricted ROM Stability: No instability detected Muscle Tone/Strength: Functionally intact. No obvious neuro-muscular anomalies detected. Sensory (Neurological): Unimpaired Muscle strength & Tone: No palpable anomalies  Lumbar Spine Area Exam  Skin & Axial Inspection: No masses, redness, or swelling Alignment: Symmetrical Functional ROM: Unrestricted ROM       Stability: No instability detected Muscle Tone/Strength: Functionally intact. No obvious neuro-muscular anomalies detected. Sensory (Neurological): Unimpaired Palpation: Tender        Gait & Posture Assessment  Ambulation:  Unassisted Gait: Relatively normal for age and body habitus Posture: WNL   Lower Extremity Exam    Side: Right lower extremity  Side: Left lower extremity  Stability: No instability observed          Stability: No instability observed          Skin & Extremity Inspection: Skin color, temperature, and hair growth are WNL. No peripheral edema or cyanosis. No masses, redness, swelling, asymmetry, or associated skin lesions. No contractures.  Skin & Extremity Inspection: Skin color, temperature, and hair growth are WNL. No peripheral edema or cyanosis. No masses, redness, swelling, asymmetry, or associated skin lesions. No contractures.  Functional ROM: Unrestricted ROM                  Functional ROM: Unrestricted ROM                  Muscle Tone/Strength: Functionally intact. No obvious neuro-muscular anomalies detected.  Muscle Tone/Strength: Functionally intact. No obvious neuro-muscular anomalies detected.  Sensory (Neurological): Referred pain pattern        Sensory (Neurological): Referred pain pattern        Palpation: No palpable anomalies  Palpation: No palpable anomalies   Assessment  Primary Diagnosis & Pertinent Problem List: The primary encounter diagnosis was Fibromyalgia affecting multiple sites (Secondary Area of Pain). Diagnoses of Cervical spondylosis without myelopathy, Spondylosis of lumbar spine, Chronic pain syndrome, and Long term current use of opiate analgesic were  also pertinent to this visit.  Status Diagnosis  Worsening Persistent Worsening 1. Fibromyalgia affecting multiple sites (Secondary Area of Pain)   2. Cervical spondylosis without myelopathy   3. Spondylosis of lumbar spine   4. Chronic pain syndrome   5. Long term current use of opiate analgesic     Problems updated and reviewed during this visit: No problems updated. Plan of Care  Pharmacotherapy (Medications Ordered): Meds ordered this encounter  Medications   traMADol (ULTRAM) 50 MG tablet     Sig: Take 1 tablet (50 mg total) by mouth every 6 (six) hours as needed for severe pain.    Dispense:  120 tablet    Refill:  2    Do not place this medication on "Automatic Refill". Patient may have prescription filled one day early if pharmacy is closed on scheduled refill date. Do not fill until: To last until:    Order Specific Question:   Supervising Provider    Answer:   Milinda Pointer [phone]   meloxicam (MOBIC) 7.5 MG tablet    Sig: Take 1 tablet (7.5 mg total) by mouth 2 (two) times daily.    Dispense:  60 tablet    Refill:  2    Order Specific Question:   Supervising Provider    Answer:   Milinda Pointer [phone]   New Prescriptions   MELOXICAM (MOBIC) 7.5 MG TABLET    Take 1 tablet (7.5 mg total) by mouth 2 (two) times daily.   Medications administered today: Maudry A. Atha had no medications administered during this visit. Lab-work, procedure(s), and/or referral(s): Orders Placed This Encounter  Procedures   ToxASSURE Select 13 (MW), Urine   Imaging and/or referral(s): None  Interventional management options: Planned, scheduled, and/or pending: Non at this time. SCS information given   Considering: Diagnostic bilateral intra-articular shoulder joint injection Diagnostic bilateral suprascapular nerve block Possible bilateral suprascapular nerve RFA Diagnostic bilateral intra-articular hip joint injection Diagnostic/therapeutic bilateral trochanteric bursa injection Diagnostic bilateral femoral nerve +obturator nerve block Possible bilateral femoral nerve + obturator nerve RFA Diagnostic/therapeuticLidocaine infusion Diagnostic/therapeuticTrigger point injection Diagnostic bilateral cervical facet block Diagnostic bilateral cervical facet RFA   Palliative PRN treatment(s): Diagnostic bilateralintra-articularhipjoint injection#2+ bilateral trochanteric bursa injection #2, under fluoroscopic guidance and IV sedation    Provider-requested follow-up: Return in about 3 months (around 03/27/2018) for MedMgmt.  Future Appointments  Date Time Provider [place]  03/18/2018  8:30 AM Vevelyn Francois, NP [facility] None  04/24/2018  4:30 PM Liane Comber, NP [facility] None  09/03/2018  9:00 AM Vicie Mutters, [place] None   Primary Care Physician: Vicie Mutters, PA-C Location: [facility] Note by: Vevelyn Francois NP Date: 12/26/2017; Time: 11:02 PM  Pain Score Disclaimer: We use the NRS-11 scale. This is a self-reported, subjective measurement of pain severity with only modest accuracy. It is used primarily to identify changes within a particular patient. It must be understood that outpatient pain scales are significantly less accurate that those used for research, where they can be applied under ideal controlled circumstances with minimal exposure to variables. In reality, the score is likely to be a combination of pain intensity and pain affect, where pain affect describes the degree of emotional arousal or changes in action readiness caused by the sensory experience of pain. Factors such as social and work situation, setting, emotional state, anxiety levels, expectation, and prior pain experience may influence pain perception and show large inter-individual differences that may also be affected by time variables.  Patient instructions provided during this appointment: Patient Instructions   ____________________________________________________________________________________________  Medication Rules  Purpose: To inform patients, and their family members, of our rules and regulations.  Applies to: All patients receiving prescriptions (written or electronic).  Pharmacy of record: Pharmacy where electronic prescriptions will be sent. If written prescriptions are taken to a different pharmacy, please inform the nursing staff. The pharmacy listed in the electronic  medical record should be the one where you would like electronic prescriptions to be sent.  Electronic prescriptions: In compliance with the [place] (STOP) Act of 2017 (Session Lanny Cramp [phone]), effective January 15, 2018, all controlled substances must be electronically prescribed. Calling prescriptions to the pharmacy will cease to exist.  Prescription refills: Only during scheduled appointments. Applies to all prescriptions.  NOTE: The following applies primarily to controlled substances (Opioid* Pain Medications).   Patient's responsibilities: 1. Pain Pills: Bring all pain pills to every appointment (except for procedure appointments). 2. Pill Bottles: Bring pills in original pharmacy bottle. Always bring the newest bottle. Bring bottle, even if empty. 3. Medication refills: You are responsible for knowing and keeping track of what medications you take and those you need refilled. The day before your appointment: write a list of all prescriptions that need to be refilled. The day of the appointment: give the list to the admitting nurse. Prescriptions will be written only during appointments. If you forget a medication: it will not be "Called in", "Faxed", or "electronically sent". You will need to get another appointment to get these prescribed. No early refills. Do not call asking to have your prescription filled early. 4. Prescription Accuracy: You are responsible for carefully inspecting your prescriptions before leaving our office. Have the discharge nurse carefully go over each prescription with you, before taking them home. Make sure that your name is accurately spelled, that your address is correct. Check the name and dose of your medication to make sure it is accurate. Check the number of pills, and the written instructions to make sure they are clear and accurate. Make sure that you are given enough medication to last until your next medication  refill appointment. 5. Taking Medication: Take medication as prescribed. When it comes to controlled substances, taking less pills or less frequently than prescribed is permitted and encouraged. Never take more pills than instructed. Never take medication more frequently than prescribed.  6. Inform other Doctors: Always inform, all of your healthcare providers, of all the medications you take. 7. Pain Medication from other Providers: You are not allowed to accept any additional pain medication from any other Doctor or Healthcare provider. There are two exceptions to this rule. (see below) In the event that you require additional pain medication, you are responsible for notifying us, as stated below. 8. Medication Agreement: You are responsible for carefully reading and following our Medication Agreement. This must be signed before receiving any prescriptions from our practice. Safely store a copy of your signed Agreement. Violations to the Agreement will result in no further prescriptions. (Additional copies of our Medication Agreement are available upon request.) 9. Laws, Rules, & Regulations: All patients are expected to follow all Federal and [employer], [employer], Rules, [employer]. Ignorance of the Laws does not constitute a valid excuse. The use of any illegal substances is prohibited. 10. Adopted CDC guidelines & recommendations: Target dosing levels will be at or below 60 MME/day. Use of benzodiazepines** is not recommended.  Exceptions: There are only two exceptions to the rule  of not receiving pain medications from other Healthcare Providers. 1. Exception #1 (Emergencies): In the event of an emergency (i.e.: accident requiring emergency care), you are allowed to receive additional pain medication. However, you are responsible for: As soon as you are able, call our office [phone], at any time of the day or night, and leave a message stating your name, the date and nature of the  emergency, and the name and dose of the medication prescribed. In the event that your call is answered by a member of our staff, make sure to document and save the date, time, and the name of the person that took your information.  2. Exception #2 (Planned Surgery): In the event that you are scheduled by another doctor or dentist to have any type of surgery or procedure, you are allowed (for a period no longer than 30 days), to receive additional pain medication, for the acute post-op pain. However, in this case, you are responsible for picking up a copy of our "Post-op Pain Management for Surgeons" handout, and giving it to your surgeon or dentist. This document is available at our office, and does not require an appointment to obtain it. Simply go to our office during business hours (Monday-Thursday from 8:00 AM to 4:00 PM) (Friday 8:00 AM to 12:00 Noon) or if you have a scheduled appointment with [place], prior to your surgery, and ask for it by name. In addition, you will need to provide [place] with your name, name of your surgeon, type of surgery, and date of procedure or surgery.  *Opioid medications include: morphine, codeine, oxycodone, oxymorphone, hydrocodone, hydromorphone, meperidine, tramadol, tapentadol, buprenorphine, fentanyl, methadone. **Benzodiazepine medications include: diazepam (Valium), alprazolam (Xanax), clonazepam (Klonopine), lorazepam (Ativan), clorazepate (Tranxene), chlordiazepoxide (Librium), estazolam (Prosom), oxazepam (Serax), temazepam (Restoril), triazolam (Halcion) (Last updated: 03/14/2017) ____________________________________________________________________________________________    BMI Assessment: Estimated body mass index is 37.9 kg/m as calculated from the following:   Height as of this encounter: 5' 7" (1.702 m).   Weight as of this encounter: 242 lb (109.8 kg).  BMI interpretation table: BMI level Category Range association with higher incidence of chronic pain   <18 kg/m2 Underweight   18.5-24.9 kg/m2 Ideal body weight   25-29.9 kg/m2 Overweight Increased incidence by 20%  30-34.9 kg/m2 Obese (Class I) Increased incidence by 68%  35-39.9 kg/m2 Severe obesity (Class II) Increased incidence by 136%  >40 kg/m2 Extreme obesity (Class III) Increased incidence by 254%   Patient's current BMI Ideal Body weight  Body mass index is 37.9 kg/m. Ideal body weight: 61.6 kg (135 lb 12.9 oz) Adjusted ideal body weight: 80.9 kg (178 lb 4.5 oz)   BMI Readings from Last 4 Encounters:  12/26/17 37.90 kg/m  12/20/17 37.90 kg/m  10/08/17 36.49 kg/m  09/25/17 35.28 kg/m   Wt Readings from Last 4 Encounters:  12/26/17 242 lb (109.8 kg)  12/20/17 242 lb (109.8 kg)  10/08/17 233 lb (105.7 kg)  09/25/17 232 lb (105.2 kg)   ______________________________________________________________________________________________  Specialty Pain Scale  Introduction:  There are significant differences in how pain is reported. The word pain usually refers to physical pain, but it is also a common synonym of suffering. The medical community uses a scale from 0 (zero) to 10 (ten) to report pain level. Zero (0) is described as "no pain", while ten (10) is described as "the worse pain you can imagine". The problem with this scale is that physical pain is reported along with suffering. Suffering refers to mental pain, or more  often yet it refers to any unpleasant feeling, emotion or aversion associated with the perception of harm or threat of harm. It is the psychological component of pain.  Pain Specialists prefer to separate the two components. The pain scale used by this practice is the Verbal Numerical Rating Scale (VNRS-11). This scale is for the physical pain only. DO NOT INCLUDE how your pain psychologically affects you. This scale is for adults 10 years of age and older. It has 11 (eleven) levels. The 1st level is 0/10. This means: "right now, I have no pain". In the context  of pain management, it also means: "right now, my physical pain is under control with the current therapy".  General Information:  The scale should reflect your current level of pain. Unless you are specifically asked for the level of your worst pain, or your average pain. If you are asked for one of these two, then it should be understood that it is over the past 24 hours.  Levels 1 (one) through 5 (five) are described below, and can be treated as an outpatient. Ambulatory pain management facilities such as ours are more than adequate to treat these levels. Levels 6 (six) through 10 (ten) are also described below, however, these must be treated as a hospitalized patient. While levels 6 (six) and 7 (seven) may be evaluated at an urgent care facility, levels 8 (eight) through 10 (ten) constitute medical emergencies and as such, they belong in a hospital's emergency department. When having these levels (as described below), do not come to our office. Our facility is not equipped to manage these levels. Go directly to an urgent care facility or an emergency department to be evaluated.  Definitions:  Activities of Daily Living (ADL): Activities of daily living (ADL or ADLs) is a term used in healthcare to refer to people's daily self-care activities. Health professionals often use a person's ability or inability to perform ADLs as a measurement of their functional status, particularly in regard to people post injury, with disabilities and the elderly. There are two ADL levels: Basic and Instrumental. Basic Activities of Daily Living (BADL  or BADLs) consist of self-care tasks that include: Bathing and showering; personal hygiene and grooming (including brushing/combing/styling hair); dressing; Toilet hygiene (getting to the toilet, cleaning oneself, and getting back up); eating and self-feeding (not including cooking or chewing and swallowing); functional mobility, often referred to as "transferring", as  measured by the ability to walk, get in and out of bed, and get into and out of a chair; the broader definition (moving from one place to another while performing activities) is useful for people with different physical abilities who are still able to get around independently. Basic ADLs include the things many people do when they get up in the morning and get ready to go out of the house: get out of bed, go to the toilet, bathe, dress, groom, and eat. On the average, loss of function typically follows a particular order. Hygiene is the first to go, followed by loss of toilet use and locomotion. The last to go is the ability to eat. When there is only one remaining area in which the person is independent, there is a 62.9% chance that it is eating and only a 3.5% chance that it is hygiene. Instrumental Activities of Daily Living (IADL or IADLs) are not necessary for fundamental functioning, but they let an individual live independently in a community. IADL consist of tasks that include: cleaning and maintaining the house;  home establishment and maintenance; care of others (including selecting and supervising caregivers); care of pets; child rearing; managing money; managing financials (investments, etc.); meal preparation and cleanup; shopping for groceries and necessities; moving within the community; safety procedures and emergency responses; health management and maintenance (taking prescribed medications); and using the telephone or other form of communication.  Instructions:  Most patients tend to report their pain as a combination of two factors, their physical pain and their psychosocial pain. This last one is also known as suffering and it is reflection of how physical pain affects you socially and psychologically. From now on, report them separately.  From this point on, when asked to report your pain level, report only your physical pain. Use the following table for reference.  Pain Clinic Pain  Levels (0-5/10)  Pain Level Score  Description  No Pain 0   Mild pain 1 Nagging, annoying, but does not interfere with basic activities of daily living (ADL). Patients are able to eat, bathe, get dressed, toileting (being able to get on and off the toilet and perform personal hygiene functions), transfer (move in and out of bed or a chair without assistance), and maintain continence (able to control bladder and bowel functions). Blood pressure and heart rate are unaffected. A normal heart rate for a healthy adult ranges from 60 to 100 bpm (beats per minute).   Mild to moderate pain 2 Noticeable and distracting. Impossible to hide from other people. More frequent flare-ups. Still possible to adapt and function close to normal. It can be very annoying and may have occasional stronger flare-ups. With discipline, patients may get used to it and adapt.   Moderate pain 3 Interferes significantly with activities of daily living (ADL). It becomes difficult to feed, bathe, get dressed, get on and off the toilet or to perform personal hygiene functions. Difficult to get in and out of bed or a chair without assistance. Very distracting. With effort, it can be ignored when deeply involved in activities.   Moderately severe pain 4 Impossible to ignore for more than a few minutes. With effort, patients may still be able to manage work or participate in some social activities. Very difficult to concentrate. Signs of autonomic nervous system discharge are evident: dilated pupils (mydriasis); mild sweating (diaphoresis); sleep interference. Heart rate becomes elevated (>115 bpm). Diastolic blood pressure (lower number) rises above 100 mmHg. Patients find relief in laying down and not moving.   Severe pain 5 Intense and extremely unpleasant. Associated with frowning face and frequent crying. Pain overwhelms the senses.  Ability to do any activity or maintain social relationships becomes significantly limited. Conversation  becomes difficult. Pacing back and forth is common, as getting into a comfortable position is nearly impossible. Pain wakes you up from deep sleep. Physical signs will be obvious: pupillary dilation; increased sweating; goosebumps; brisk reflexes; cold, clammy hands and feet; nausea, vomiting or dry heaves; loss of appetite; significant sleep disturbance with inability to fall asleep or to remain asleep. When persistent, significant weight loss is observed due to the complete loss of appetite and sleep deprivation.  Blood pressure and heart rate becomes significantly elevated. Caution: If elevated blood pressure triggers a pounding headache associated with blurred vision, then the patient should immediately seek attention at an urgent or emergency care unit, as these may be signs of an impending stroke.    Emergency Department Pain Levels (6-10/10)  Emergency Room Pain 6 Severely limiting. Requires emergency care and should not be seen or managed  at an outpatient pain management facility. Communication becomes difficult and requires great effort. Assistance to reach the emergency department may be required. Facial flushing and profuse sweating along with potentially dangerous increases in heart rate and blood pressure will be evident.   Distressing pain 7 Self-care is very difficult. Assistance is required to transport, or use restroom. Assistance to reach the emergency department will be required. Tasks requiring coordination, such as bathing and getting dressed become very difficult.   Disabling pain 8 Self-care is no longer possible. At this level, pain is disabling. The individual is unable to do even the most basic activities such as walking, eating, bathing, dressing, transferring to a bed, or toileting. Fine motor skills are lost. It is difficult to think clearly.   Incapacitating pain 9 Pain becomes incapacitating. Thought processing is no longer possible. Difficult to remember your own name.  Control of movement and coordination are lost.   The worst pain imaginable 10 At this level, most patients pass out from pain. When this level is reached, collapse of the autonomic nervous system occurs, leading to a sudden drop in blood pressure and heart rate. This in turn results in a temporary and dramatic drop in blood flow to the brain, leading to a loss of consciousness. Fainting is one of the bodys self defense mechanisms. Passing out puts the brain in a calmed state and causes it to shut down for a while, in order to begin the healing process.    Summary: 1. Refer to this scale when providing [place] with your pain level. 2. Be accurate and careful when reporting your pain level. This will help with your care. 3. Over-reporting your pain level will lead to loss of credibility. 4. Even a level of 1/10 means that there is pain and will be treated at our facility. 5. High, inaccurate reporting will be documented as Symptom Exaggeration, leading to loss of credibility and suspicions of possible secondary gains such as obtaining more narcotics, or wanting to appear disabled, for fraudulent reasons. 6. Only pain levels of 5 or below will be seen at our facility. 7. Pain levels of 6 and above will be sent to the Emergency Department and the appointment cancelled. ______________________________________________________________________________________________   ____________________________________________________________________________________________  Medication Rules  Purpose: To inform patients, and their family members, of our rules and regulations.  Applies to: All patients receiving prescriptions (written or electronic).  Pharmacy of record: Pharmacy where electronic prescriptions will be sent. If written prescriptions are taken to a different pharmacy, please inform the nursing staff. The pharmacy listed in the electronic medical record should be the one where you would like electronic  prescriptions to be sent.  Electronic prescriptions: In compliance with the [place] (STOP) Act of 2017 (Session Lanny Cramp [phone]), effective January 15, 2018, all controlled substances must be electronically prescribed. Calling prescriptions to the pharmacy will cease to exist.  Prescription refills: Only during scheduled appointments. Applies to all prescriptions.  NOTE: The following applies primarily to controlled substances (Opioid* Pain Medications).   Patient's responsibilities: 11. Pain Pills: Bring all pain pills to every appointment (except for procedure appointments). 12. Pill Bottles: Bring pills in original pharmacy bottle. Always bring the newest bottle. Bring bottle, even if empty. 13. Medication refills: You are responsible for knowing and keeping track of what medications you take and those you need refilled. The day before your appointment: write a list of all prescriptions that need to be refilled. The day of the appointment: give  the list to the admitting nurse. Prescriptions will be written only during appointments. If you forget a medication: it will not be "Called in", "Faxed", or "electronically sent". You will need to get another appointment to get these prescribed. No early refills. Do not call asking to have your prescription filled early. 14. Prescription Accuracy: You are responsible for carefully inspecting your prescriptions before leaving our office. Have the discharge nurse carefully go over each prescription with you, before taking them home. Make sure that your name is accurately spelled, that your address is correct. Check the name and dose of your medication to make sure it is accurate. Check the number of pills, and the written instructions to make sure they are clear and accurate. Make sure that you are given enough medication to last until your next medication refill appointment. 15. Taking Medication: Take medication  as prescribed. When it comes to controlled substances, taking less pills or less frequently than prescribed is permitted and encouraged. Never take more pills than instructed. Never take medication more frequently than prescribed.  16. Inform other Doctors: Always inform, all of your healthcare providers, of all the medications you take. 17. Pain Medication from other Providers: You are not allowed to accept any additional pain medication from any other Doctor or Healthcare provider. There are two exceptions to this rule. (see below) In the event that you require additional pain medication, you are responsible for notifying us, as stated below. 18. Medication Agreement: You are responsible for carefully reading and following our Medication Agreement. This must be signed before receiving any prescriptions from our practice. Safely store a copy of your signed Agreement. Violations to the Agreement will result in no further prescriptions. (Additional copies of our Medication Agreement are available upon request.) 19. Laws, Rules, & Regulations: All patients are expected to follow all Federal and [employer], [employer], Rules, [employer]. Ignorance of the Laws does not constitute a valid excuse. The use of any illegal substances is prohibited. 20. Adopted CDC guidelines & recommendations: Target dosing levels will be at or below 60 MME/day. Use of benzodiazepines** is not recommended.  Exceptions: There are only two exceptions to the rule of not receiving pain medications from other Healthcare Providers. 3. Exception #1 (Emergencies): In the event of an emergency (i.e.: accident requiring emergency care), you are allowed to receive additional pain medication. However, you are responsible for: As soon as you are able, call our office [phone], at any time of the day or night, and leave a message stating your name, the date and nature of the emergency, and the name and dose of the medication prescribed.  In the event that your call is answered by a member of our staff, make sure to document and save the date, time, and the name of the person that took your information.  4. Exception #2 (Planned Surgery): In the event that you are scheduled by another doctor or dentist to have any type of surgery or procedure, you are allowed (for a period no longer than 30 days), to receive additional pain medication, for the acute post-op pain. However, in this case, you are responsible for picking up a copy of our "Post-op Pain Management for Surgeons" handout, and giving it to your surgeon or dentist. This document is available at our office, and does not require an appointment to obtain it. Simply go to our office during business hours (Monday-Thursday from 8:00 AM to 4:00 PM) (Friday 8:00 AM to 12:00 Noon) or if you  have a scheduled appointment with [place], prior to your surgery, and ask for it by name. In addition, you will need to provide [place] with your name, name of your surgeon, type of surgery, and date of procedure or surgery.  *Opioid medications include: morphine, codeine, oxycodone, oxymorphone, hydrocodone, hydromorphone, meperidine, tramadol, tapentadol, buprenorphine, fentanyl, methadone. **Benzodiazepine medications include: diazepam (Valium), alprazolam (Xanax), clonazepam (Klonopine), lorazepam (Ativan), clorazepate (Tranxene), chlordiazepoxide (Librium), estazolam (Prosom), oxazepam (Serax), temazepam (Restoril), triazolam (Halcion) (Last updated: 03/14/2017) ____________________________________________________________________________________________   ____________________________________________________________________________________________  Drug Holidays (Rapid)  Definitions Tolerance: defined as the progressively decreased responsiveness to a drug. Occurs when the drug is used repeatedly and the body adapts to the continued presence of the drug. As a result, a larger dose of the drug is needed to  achieve the effect originally obtained by a smaller dose. It is thought to be due to the formation of excess opioid receptors.  Drug Holiday: is when a patient stops taking a medication(s) for a period of time; anywhere from a few days to several weeks.  Withdrawals: refers to the wide range of symptoms that occur after stopping or dramatically reducing opiate drugs after heavy and prolonged use. Withdrawal symptoms do not occur to patients that use low dose opioids, or those who take the medication sporadically. Contrary to benzodiazepine (example: Valium, Xanax, etc.) or alcohol withdrawals (Delirium Tremens), opioid withdrawals are not lethal. Withdrawals are the physical manifestation of the body getting rid of the excess receptors.  Purpose To eliminate tolerance.  Duration of Holiday 14 consecutive days. (2 weeks)  Expected Symptoms Early symptoms of withdrawal include:  Agitation  Anxiety  Muscle aches  Increased tearing  Insomnia  Runny nose  Sweating  Yawning  Late symptoms of withdrawal include:  Abdominal cramping  Diarrhea  Dilated pupils  Goose bumps  Nausea  Vomiting  Opioid withdrawal reactions are very uncomfortable but are not life-threatening. Symptoms usually start within 12 hours of last opioid dose and within 30 hours of last methadone exposure.  Duration of Symptoms 48 to 72 hours for short acting medications and 2 to 14 days for methadone.  Treatment  Clonidine (Catapres) or tizanidine (Zanaflex) for agitation, sweating, tearing, runny nose.  Promethazine (Phenergan) for nausea, vomiting.  NSAIDs for pain.  Benefits  Improved effectiveness of opioids.  Decreased opioid dose needed to achieve benefits.  Improved pain with lesser dose. ____________________________________________________________________________________________ Information and CD given on [employer]. [facility] to call and schedule trial if  interested.

## 2017-12-31 ENCOUNTER — Other Ambulatory Visit: Payer: Self-pay | Admitting: Internal Medicine

## 2017-12-31 DIAGNOSIS — G4700 Insomnia, unspecified: Secondary | ICD-10-CM

## 2017-12-31 DIAGNOSIS — M797 Fibromyalgia: Secondary | ICD-10-CM

## 2018-01-02 LAB — TOXASSURE SELECT 13 (MW), URINE

## 2018-01-17 ENCOUNTER — Other Ambulatory Visit: Payer: Self-pay | Admitting: Physician Assistant

## 2018-01-26 NOTE — Progress Notes (Signed)
Patient's Name: Wendy Valencia  MRN: [id number]  Referring Provider: Vicie Mutters, PA-C  DOB: 1965-12-10  PCP: Vicie Mutters, PA-C  DOS: 01/29/2018  Note by: Gaspar Cola, MD  Service setting: Ambulatory outpatient  Specialty: Interventional Pain Management  Location: [facility] (AMB) Pain Management Facility    Patient type: Established   Primary Reason(s) for Visit: Evaluation of chronic illnesses with exacerbation, or progression (Level of risk: moderate) CC: Back Pain (lower back); Hip Pain (bilateral); and Shoulder Pain (bilateral)  HPI  Wendy Valencia is a 53 y.o. year old, female patient, who comes today for a follow-up evaluation. She has Anxiety state; Essential hypertension; Tobacco abuse; Morbid obesity ([place]); Prediabetes; Insomnia; Medication management; Vitamin D deficiency; S/P gastric bypass; Cervical spondylosis without myelopathy; Fibromyalgia affecting multiple sites (Secondary Area of Pain); Spondylosis of lumbar spine; Chronic generalized pain (Primary Area of Pain); Chronic pain syndrome; Long term current use of opiate analgesic; Chronic shoulder pain (Tertiary Area of Pain) (Bilateral) (R>L); Chronic hip pain (Fourth Area of Pain) (Bilateral) (R>L); Greater trochanteric bursitis of hips (Bilateral); and Osteoarthritis of hips (Bilateral) on their problem list. Wendy Valencia was last seen on Visit date not found. Her primarily concern today is the Back Pain (lower back); Hip Pain (bilateral); and Shoulder Pain (bilateral)  Pain Assessment: Location: Right, Left Hip Radiating: Lower back to Bilateral Hips, also bilateral shoulders Onset: More than a month ago Duration: Chronic pain Quality: Aching, Sharp, Radiating Severity: 3 /10 (subjective, self-reported pain score)  Note: Reported level is inconsistent with clinical observations. Clinically the patient looks like a 1/10 A 1/10 is viewed as "Mild" and described as nagging, annoying, but not interfering with basic  activities of daily living (ADL). Wendy Valencia is able to eat, bathe, get dressed, do toileting (being able to get on and off the toilet and perform personal hygiene functions), transfer (move in and out of bed or a chair without assistance), and maintain continence (able to control bladder and bowel functions). Physiologic parameters such as blood pressure and heart rate apear wnl. Wendy Valencia continues to use a standard subjective pain scale, rather than an objective pain scale as instructed. When using our objective Pain Scale, levels between 6 and 10/10 are said to belong in an emergency room, as it progressively worsens from a 6/10, described as severely limiting, requiring emergency care not usually available at an outpatient pain management facility. At a 6/10 level, communication becomes difficult and requires great effort. Assistance to reach the emergency department may be required. Facial flushing and profuse sweating along with potentially dangerous increases in heart rate and blood pressure will be evident. Effect on ADL: cold weather makes it worse, walking,standing in shower. Timing: Constant Modifying factors: Medication, Tens Unit,  BP: 139/90  HR: 86  The patient comes into the clinic today inquiring about the possibility of a spinal cord stimulator implant.  Today we went over several things but in a nutshell, she is not a good candidate for a spinal cord stimulator trial or implant.  Further details on both, my assessment(s), as well as the proposed treatment plan, please see below.  Laboratory Chemistry  Inflammation Markers (CRP: Acute Phase) (ESR: Chronic Phase) Lab Results  Component Value Date   CRP 0.9 03/04/2017   ESRSEDRATE 22 (H) 03/04/2017                         Rheumatology Markers No results found.  Renal Function Markers Lab Results  Component  Value Date   BUN 12 12/20/2017   CREATININE 0.63 47/42/5956   BCR NOT APPLICABLE 38/75/6433   GFRAA 120  12/20/2017   GFRNONAA 104 12/20/2017                             Hepatic Function Markers Lab Results  Component Value Date   AST 15 12/20/2017   ALT 13 12/20/2017   ALBUMIN 4.5 03/05/2016   ALKPHOS 123 (H) 03/05/2016                        Electrolytes Lab Results  Component Value Date   NA 140 12/20/2017   K 3.9 12/20/2017   CL 105 12/20/2017   CALCIUM 9.1 12/20/2017   MG 2.0 09/02/2017                        Neuropathy Markers Lab Results  Component Value Date   VITAMINB12 693 09/02/2017   HGBA1C 5.9 (H) 12/20/2017   HIV NON REACTIVE 02/16/2013                        CNS Tests No results found.  Bone Pathology Markers Lab Results  Component Value Date   VD25OH 34 09/02/2017                         Coagulation Parameters Lab Results  Component Value Date   PLT 383 12/20/2017   DDIMER 0.36 09/12/2012                        Cardiovascular Markers Lab Results  Component Value Date   TROPONINI <0.30 09/12/2012   HGB 11.2 (L) 12/20/2017   HCT 35.6 12/20/2017                         CA Markers Lab Results  Component Value Date   CA125 11 10/08/2017                        Note: Lab results reviewed.  Imaging Review  Shoulder Imaging: Shoulder-R DG:  Results for orders placed during the hospital encounter of 06/06/17  DG Shoulder Right   Narrative CLINICAL DATA:  Chronic shoulder pain  EXAM: RIGHT SHOULDER - 2+ VIEW  COMPARISON:  None.  FINDINGS: There is no evidence of fracture or dislocation. There is no evidence of arthropathy or other focal bone abnormality. Soft tissues are unremarkable.  IMPRESSION: Negative.   Electronically Signed   By: Franchot Gallo M.D.   On: 06/07/2017 08:41    Shoulder-L DG:  Results for orders placed during the hospital encounter of 06/06/17  DG Shoulder Left   Narrative CLINICAL DATA:  Chronic shoulder pain  EXAM: LEFT SHOULDER - 2+ VIEW  COMPARISON:  None.  FINDINGS: There is no evidence of  fracture or dislocation. There is no evidence of arthropathy or other focal bone abnormality. Soft tissues are unremarkable.  IMPRESSION: Negative.   Electronically Signed   By: Franchot Gallo M.D.   On: 06/07/2017 08:41    Hip Imaging: Hip-R DG 2-3 views:  Results for orders placed during the hospital encounter of 06/06/17  DG HIP UNILAT W OR W/O PELVIS 2-3 VIEWS RIGHT   Narrative CLINICAL DATA:  Chronic bilateral hip pain  EXAM: DG HIP (WITH OR WITHOUT PELVIS) 2-3V RIGHT  COMPARISON:  None.  FINDINGS: There is no evidence of hip fracture or dislocation. Small soft tissue calcification adjacent to the greater trochanter compatible with chronic calcific bursitis.  IMPRESSION: No acute abnormality.  Mild chronic bursitis.   Electronically Signed   By: Franchot Gallo M.D.   On: 06/07/2017 08:40    Hip-L DG 2-3 views:  Results for orders placed during the hospital encounter of 06/06/17  DG HIP UNILAT W OR W/O PELVIS 2-3 VIEWS LEFT   Narrative CLINICAL DATA:  Bilateral chronic hip pain  EXAM: DG HIP (WITH OR WITHOUT PELVIS) 2-3V LEFT  COMPARISON:  None.  FINDINGS: There is no evidence of hip fracture or dislocation. There is no evidence of arthropathy or other focal bone abnormality.  IMPRESSION: Negative.   Electronically Signed   By: Franchot Gallo M.D.   On: 06/07/2017 08:39    Complexity Note: Imaging results reviewed. Results shared with Wendy Valencia, using Layman's terms.                         Meds   Current Outpatient Medications:  .  Albuterol Sulfate (PROVENTIL HFA IN), Inhale 108 mcg into the lungs as needed., Disp: , Rfl:  .  Aspirin-Acetaminophen-Caffeine (EXCEDRIN EXTRA STRENGTH PO), Take 1 tablet by mouth 3 (three) times daily as needed (headache). , Disp: , Rfl:  .  augmented betamethasone dipropionate (DIPROLENE-AF) 0.05 % ointment, Apply topically daily. (Patient taking differently: Apply 1 application topically 3 (three) times  daily as needed. ), Disp: 50 g, Rfl: 3 .  benzonatate (TESSALON) 200 MG capsule, Take 1 tab up to 3 times a day as needed for cough., Disp: 60 capsule, Rfl: 1 .  betamethasone dipropionate (DIPROLENE) 0.05 % ointment, Apply topically 2 (two) times daily., Disp: 30 g, Rfl: 0 .  cetirizine-pseudoephedrine (ZYRTEC-D) 5-120 MG tablet, Take 1 tablet by mouth every 12 (twelve) hours as needed for allergies. , Disp: , Rfl:  .  ciprofloxacin (CILOXAN) 0.3 % ophthalmic solution, Place 1-2 drops into both eyes as needed., Disp: , Rfl: 0 .  diphenhydrAMINE (BENADRYL) 25 MG tablet, Take 25-50 mg by mouth 3 (three) times daily as needed for allergies., Disp: , Rfl:  .  doxepin (SINEQUAN) 25 MG capsule, TAKE ONE CAPSULE BY MOUTH 2 HOURS BEFORE BEDTIME AND MAY REPEAT AT HOUR OF SLEEP, Disp: 30 capsule, Rfl: 0 .  DULoxetine (CYMBALTA) 60 MG capsule, TAKE ONE CAPSULE BY MOUTH DAILY, Disp: 30 capsule, Rfl: 0 .  ferrous sulfate 325 (65 FE) MG EC tablet, Take 325 mg by mouth at bedtime. , Disp: , Rfl:  .  gabapentin (NEURONTIN) 300 MG capsule, Take 1-2 caps up to three times a day as needed for pain., Disp: 180 capsule, Rfl: 2 .  Ginkgo Biloba (GINKOBA PO), Take 1 capsule by mouth daily. , Disp: , Rfl:  .  Glucosamine-Chondroitin (COSAMIN DS PO), Take 1,500 mg by mouth daily., Disp: , Rfl:  .  lisinopril-hydrochlorothiazide (PRINZIDE,ZESTORETIC) 20-12.5 MG tablet, TAKE 1 TABLET BY MOUTH EVERY DAY, Disp: 30 tablet, Rfl: 2 .  Magnesium 500 MG TABS, Take 500 mg by mouth 2 (two) times daily. , Disp: , Rfl:  .  medroxyPROGESTERone (PROVERA) 5 MG tablet, Take one tab daily x 10 days. Repeat every 3 mos., Disp: 10 tablet, Rfl: 4 .  meloxicam (MOBIC) 7.5 MG tablet, Take 1 tablet (7.5 mg total) by mouth 2 (two) times daily., Disp:  60 tablet, Rfl: 2 .  metoCLOPramide (REGLAN) 10 MG tablet, TAKE ONE TABLET BY MOUTH ONCE DAILY (Patient taking differently: TAKE ONE TABLET BY MOUTH ONCE DAILY as needed heartburn), Disp: 90 tablet,  Rfl: 0 .  Multiple Vitamins-Minerals (MULTIVITAMIN WITH MINERALS) tablet, Take 1 tablet by mouth daily. ., Disp: , Rfl:  .  naproxen (NAPROSYN) 500 MG tablet, Take 1 tablet (500 mg total) by mouth 2 (two) times daily., Disp: 30 tablet, Rfl: 0 .  phentermine (ADIPEX-P) 37.5 MG tablet, Take 1 tablet (37.5 mg total) by mouth daily., Disp: 30 tablet, Rfl: 0 .  promethazine-dextromethorphan (PROMETHAZINE-DM) 6.25-15 MG/5ML syrup, Take 5 mLs by mouth at bedtime., Disp: 118 mL, Rfl: 0 .  Spacer/Aero-Holding Chambers (AEROCHAMBER PLUS WITH MASK) inhaler, Use as instructed, Disp: 1 each, Rfl: 2 .  traMADol (ULTRAM) 50 MG tablet, Take 1 tablet (50 mg total) by mouth every 6 (six) hours as needed for severe pain., Disp: 120 tablet, Rfl: 2 .  TURMERIC PO, Take 1,000 mg by mouth daily., Disp: , Rfl:  .  valACYclovir (VALTREX) 500 MG tablet, Take 500 mg by mouth as needed., Disp: , Rfl:  .  Vitamin D, Ergocalciferol, (DRISDOL) 1.25 MG (50000 UT) CAPS capsule, TAKE ONE CAPSULE BY MOUTH TWICE WEEKLY, Disp: 8 capsule, Rfl: 3  ROS  Constitutional: Denies any fever or chills Gastrointestinal: No reported hemesis, hematochezia, vomiting, or acute GI distress Musculoskeletal: Denies any acute onset joint swelling, redness, loss of ROM, or weakness Neurological: No reported episodes of acute onset apraxia, aphasia, dysarthria, agnosia, amnesia, paralysis, loss of coordination, or loss of consciousness  Allergies  Wendy Valencia has No Known Allergies.  PFSH  Drug: Wendy Valencia  reports no history of drug use. Alcohol:  reports current alcohol use of about 1.0 standard drinks of alcohol per week. Tobacco:  reports that she has been smoking cigarettes. She has a 1.75 pack-year smoking history. She has never used smokeless tobacco. Medical:  has a past medical history of Allergy, Anemia, Anxiety, Arthritis, Fibromyalgia, GERD (gastroesophageal reflux disease), Hypertension, IBS (irritable bowel syndrome),  Nephrolithiasis, and Plantar fasciitis of right foot. Surgical: Wendy Valencia  has a past surgical history that includes Left shoulder arthroscopic surgery (yrs ago); tummy tuck (2005); Breath tek h pylori (07/11/2011); Gastric Roux-En-Y (10/01/2011); Cholecystectomy (10/01/2011); and Colonoscopy. Family: family history includes Breast cancer (age of onset: 80) in her mother; Breast cancer (age of onset: 45) in her maternal aunt; Cancer in her maternal grandmother; Cancer (age of onset: 59) in her mother; Heart attack in her maternal grandfather and paternal grandfather; Heart disease in her father and maternal grandfather; Hypertension in her father; Lung cancer in her maternal grandmother; Multiple sclerosis in her maternal uncle; Obesity in her brother; Stroke in her father.  Constitutional Exam  General appearance: Well nourished, well developed, and well hydrated. In no apparent acute distress Vitals:   01/29/18 1400  BP: 139/90  Pulse: 86  Resp: 18  Temp: 98.6 F (37 C)  TempSrc: Oral  SpO2: 100%  Weight: 238 lb (108 kg)  Height: '5\' 7"'  (1.702 m)   BMI Assessment: Estimated body mass index is 37.28 kg/m as calculated from the following:   Height as of this encounter: '5\' 7"'  (1.702 m).   Weight as of this encounter: 238 lb (108 kg).  BMI interpretation table: BMI level Category Range association with higher incidence of chronic pain  <18 kg/m2 Underweight   18.5-24.9 kg/m2 Ideal body weight   25-29.9 kg/m2 Overweight Increased incidence by  20%  30-34.9 kg/m2 Obese (Class I) Increased incidence by 68%  35-39.9 kg/m2 Severe obesity (Class II) Increased incidence by 136%  >40 kg/m2 Extreme obesity (Class III) Increased incidence by 254%   Patient's current BMI Ideal Body weight  Body mass index is 37.28 kg/m. Ideal body weight: 61.6 kg (135 lb 12.9 oz) Adjusted ideal body weight: 80.1 kg (176 lb 10.9 oz)   BMI Readings from Last 4 Encounters:  01/29/18 37.28 kg/m  12/26/17  37.90 kg/m  12/20/17 37.90 kg/m  10/08/17 36.49 kg/m   Wt Readings from Last 4 Encounters:  01/29/18 238 lb (108 kg)  12/26/17 242 lb (109.8 kg)  12/20/17 242 lb (109.8 kg)  10/08/17 233 lb (105.7 kg)  Psych/Mental status: Alert, oriented x 3 (person, place, & time)       Eyes: PERLA Respiratory: No evidence of acute respiratory distress  Cervical Spine Area Exam  Skin & Axial Inspection: No masses, redness, edema, swelling, or associated skin lesions Alignment: Symmetrical Functional ROM: Unrestricted ROM      Stability: No instability detected Muscle Tone/Strength: Functionally intact. No obvious neuro-muscular anomalies detected. Sensory (Neurological): Unimpaired Palpation: No palpable anomalies              Upper Extremity (UE) Exam    Side: Right upper extremity  Side: Left upper extremity  Skin & Extremity Inspection: Skin color, temperature, and hair growth are WNL. No peripheral edema or cyanosis. No masses, redness, swelling, asymmetry, or associated skin lesions. No contractures.  Skin & Extremity Inspection: Skin color, temperature, and hair growth are WNL. No peripheral edema or cyanosis. No masses, redness, swelling, asymmetry, or associated skin lesions. No contractures.  Functional ROM: Unrestricted ROM          Functional ROM: Unrestricted ROM          Muscle Tone/Strength: Functionally intact. No obvious neuro-muscular anomalies detected.  Muscle Tone/Strength: Functionally intact. No obvious neuro-muscular anomalies detected.  Sensory (Neurological): Unimpaired          Sensory (Neurological): Unimpaired          Palpation: No palpable anomalies              Palpation: No palpable anomalies              Provocative Test(s):  Phalen's test: deferred Tinel's test: deferred Apley's scratch test (touch opposite shoulder):  Action 1 (Across chest): deferred Action 2 (Overhead): deferred Action 3 (LB reach): deferred   Provocative Test(s):  Phalen's test:  deferred Tinel's test: deferred Apley's scratch test (touch opposite shoulder):  Action 1 (Across chest): deferred Action 2 (Overhead): deferred Action 3 (LB reach): deferred    Thoracic Spine Area Exam  Skin & Axial Inspection: No masses, redness, or swelling Alignment: Symmetrical Functional ROM: Unrestricted ROM Stability: No instability detected Muscle Tone/Strength: Functionally intact. No obvious neuro-muscular anomalies detected. Sensory (Neurological): Unimpaired Muscle strength & Tone: No palpable anomalies  Lumbar Spine Area Exam  Skin & Axial Inspection: No masses, redness, or swelling Alignment: Symmetrical Functional ROM: Unrestricted ROM       Stability: No instability detected Muscle Tone/Strength: Functionally intact. No obvious neuro-muscular anomalies detected. Sensory (Neurological): Unimpaired Palpation: No palpable anomalies       Provocative Tests: Hyperextension/rotation test: deferred today       Lumbar quadrant test (Kemp's test): deferred today       Lateral bending test: deferred today       Patrick's Maneuver: deferred today  FABER* test: deferred today                   S-I anterior distraction/compression test: deferred today         S-I lateral compression test: deferred today         S-I Thigh-thrust test: deferred today         S-I Gaenslen's test: deferred today         *(Flexion, ABduction and External Rotation)  Gait & Posture Assessment  Ambulation: Unassisted Gait: Relatively normal for age and body habitus Posture: WNL   Lower Extremity Exam    Side: Right lower extremity  Side: Left lower extremity  Stability: No instability observed          Stability: No instability observed          Skin & Extremity Inspection: Skin color, temperature, and hair growth are WNL. No peripheral edema or cyanosis. No masses, redness, swelling, asymmetry, or associated skin lesions. No contractures.  Skin & Extremity Inspection: Skin  color, temperature, and hair growth are WNL. No peripheral edema or cyanosis. No masses, redness, swelling, asymmetry, or associated skin lesions. No contractures.  Functional ROM: Unrestricted ROM                  Functional ROM: Unrestricted ROM                  Muscle Tone/Strength: Functionally intact. No obvious neuro-muscular anomalies detected.  Muscle Tone/Strength: Functionally intact. No obvious neuro-muscular anomalies detected.  Sensory (Neurological): Unimpaired        Sensory (Neurological): Unimpaired        DTR: Patellar: deferred today Achilles: deferred today Plantar: deferred today  DTR: Patellar: deferred today Achilles: deferred today Plantar: deferred today  Palpation: No palpable anomalies  Palpation: No palpable anomalies   Assessment  Primary Diagnosis & Pertinent Problem List: The primary encounter diagnosis was Chronic generalized pain (Primary Area of Pain). Diagnoses of Fibromyalgia affecting multiple sites (Secondary Area of Pain), Chronic shoulder pain (Tertiary Area of Pain) (Bilateral) (R>L), Chronic hip pain (Fourth Area of Pain) (Bilateral) (R>L), Osteoarthritis of hips (Bilateral), Greater trochanteric bursitis of hips (Bilateral), Spondylosis of lumbar spine, Cervical spondylosis without myelopathy, S/P gastric bypass, Morbid obesity ([place]), and Tobacco abuse were also pertinent to this visit.  Status Diagnosis  Controlled Controlled Controlled 1. Chronic generalized pain (Primary Area of Pain)   2. Fibromyalgia affecting multiple sites (Secondary Area of Pain)   3. Chronic shoulder pain (Tertiary Area of Pain) (Bilateral) (R>L)   4. Chronic hip pain (Fourth Area of Pain) (Bilateral) (R>L)   5. Osteoarthritis of hips (Bilateral)   6. Greater trochanteric bursitis of hips (Bilateral)   7. Spondylosis of lumbar spine   8. Cervical spondylosis without myelopathy   9. S/P gastric bypass   10. Morbid obesity ([place])   11. Tobacco abuse     Problems  updated and reviewed during this visit: No problems updated. Plan of Care  Pharmacotherapy (Medications Ordered): No orders of the defined types were placed in this encounter.  Medications administered today: Brieanne A. Rushing had no medications administered during this visit.  Procedure Orders    No procedure(s) ordered today   Lab Orders  No laboratory test(s) ordered today   Imaging Orders  No imaging studies ordered today   Referral Orders  No referral(s) requested today   Interventional management options: Planned, scheduled, and/or pending:   None at this time.   Considering:  Diagnostic bilateral intra-articular shoulder joint injection  Diagnostic bilateral suprascapular nerve block  Possible bilateral suprascapular nerve RFA  Diagnostic bilateral intra-articular hip joint injection  Diagnostic/therapeutic bilateral trochanteric bursa injection  Diagnostic bilateral femoral nerve + obturator nerve block  Possible bilateral femoral nerve + obturator nerve RFA  Diagnostic/therapeutic Lidocaine infusion  Diagnostic/therapeutic Trigger point injection  Diagnostic bilateral cervical facet block  Diagnostic bilateral cervical facet RFA    Palliative PRN treatment(s):   Diagnostic bilateral intra-articular hip joint injection #2  + bilateral trochanteric bursa injection #2, under fluoroscopic guidance and IV sedation   Provider-requested follow-up: Return for Med-Mgmt, w/ Dionisio David, NP.  Future Appointments  Date Time Provider [place]  03/18/2018  8:30 AM Vevelyn Francois, NP [facility] None  04/24/2018  4:30 PM Liane Comber, NP [facility] None  09/03/2018  9:00 AM Vicie Mutters, [place] Idol None   Primary Care Physician: Vicie Mutters, PA-C Location: [facility] Note by: Gaspar Cola, MD Date: 01/29/2018; Time: 5:02 PM

## 2018-01-29 ENCOUNTER — Ambulatory Visit: Payer: BLUE CROSS/BLUE SHIELD | Attending: Pain Medicine | Admitting: Pain Medicine

## 2018-01-29 ENCOUNTER — Other Ambulatory Visit: Payer: Self-pay

## 2018-01-29 ENCOUNTER — Encounter: Payer: Self-pay | Admitting: Pain Medicine

## 2018-01-29 VITALS — BP 139/90 | HR 86 | Temp 98.6°F | Resp 18 | Ht 67.0 in | Wt 238.0 lb

## 2018-01-29 DIAGNOSIS — M47816 Spondylosis without myelopathy or radiculopathy, lumbar region: Secondary | ICD-10-CM | POA: Diagnosis not present

## 2018-01-29 DIAGNOSIS — R52 Pain, unspecified: Secondary | ICD-10-CM

## 2018-01-29 DIAGNOSIS — M797 Fibromyalgia: Secondary | ICD-10-CM | POA: Diagnosis not present

## 2018-01-29 DIAGNOSIS — M25551 Pain in right hip: Secondary | ICD-10-CM

## 2018-01-29 DIAGNOSIS — M25511 Pain in right shoulder: Secondary | ICD-10-CM | POA: Diagnosis not present

## 2018-01-29 DIAGNOSIS — Z9884 Bariatric surgery status: Secondary | ICD-10-CM

## 2018-01-29 DIAGNOSIS — M7062 Trochanteric bursitis, left hip: Secondary | ICD-10-CM | POA: Diagnosis not present

## 2018-01-29 DIAGNOSIS — M7061 Trochanteric bursitis, right hip: Secondary | ICD-10-CM | POA: Diagnosis not present

## 2018-01-29 DIAGNOSIS — M160 Bilateral primary osteoarthritis of hip: Secondary | ICD-10-CM | POA: Diagnosis not present

## 2018-01-29 DIAGNOSIS — M25552 Pain in left hip: Secondary | ICD-10-CM | POA: Diagnosis not present

## 2018-01-29 DIAGNOSIS — G8929 Other chronic pain: Secondary | ICD-10-CM | POA: Diagnosis not present

## 2018-01-29 DIAGNOSIS — M47812 Spondylosis without myelopathy or radiculopathy, cervical region: Secondary | ICD-10-CM | POA: Diagnosis not present

## 2018-01-29 DIAGNOSIS — M25512 Pain in left shoulder: Secondary | ICD-10-CM

## 2018-01-29 DIAGNOSIS — Z720 Tobacco use: Secondary | ICD-10-CM

## 2018-01-29 NOTE — Patient Instructions (Addendum)
______________________________________________________________________________________________  Specialty Pain Scale  Introduction:  There are significant differences in how pain is reported. The word pain usually refers to physical pain, but it is also a common synonym of suffering. The medical community uses a scale from 0 (zero) to 10 (ten) to report pain level. Zero (0) is described as "no pain", while ten (10) is described as "the worse pain you can imagine". The problem with this scale is that physical pain is reported along with suffering. Suffering refers to mental pain, or more often yet it refers to any unpleasant feeling, emotion or aversion associated with the perception of harm or threat of harm. It is the psychological component of pain.  Pain Specialists prefer to separate the two components. The pain scale used by this practice is the Verbal Numerical Rating Scale (VNRS-11). This scale is for the physical pain only. DO NOT INCLUDE how your pain psychologically affects you. This scale is for adults 21 years of age and older. It has 11 (eleven) levels. The 1st level is 0/10. This means: "right now, I have no pain". In the context of pain management, it also means: "right now, my physical pain is under control with the current therapy".  General Information:  The scale should reflect your current level of pain. Unless you are specifically asked for the level of your worst pain, or your average pain. If you are asked for one of these two, then it should be understood that it is over the past 24 hours.  Levels 1 (one) through 5 (five) are described below, and can be treated as an outpatient. Ambulatory pain management facilities such as ours are more than adequate to treat these levels. Levels 6 (six) through 10 (ten) are also described below, however, these must be treated as a hospitalized patient. While levels 6 (six) and 7 (seven) may be evaluated at an urgent care facility, levels 8  (eight) through 10 (ten) constitute medical emergencies and as such, they belong in a hospital's emergency department. When having these levels (as described below), do not come to our office. Our facility is not equipped to manage these levels. Go directly to an urgent care facility or an emergency department to be evaluated.  Definitions:  Activities of Daily Living (ADL): Activities of daily living (ADL or ADLs) is a term used in healthcare to refer to people's daily self-care activities. Health professionals often use a person's ability or inability to perform ADLs as a measurement of their functional status, particularly in regard to people post injury, with disabilities and the elderly. There are two ADL levels: Basic and Instrumental. Basic Activities of Daily Living (BADL  or BADLs) consist of self-care tasks that include: Bathing and showering; personal hygiene and grooming (including brushing/combing/styling hair); dressing; Toilet hygiene (getting to the toilet, cleaning oneself, and getting back up); eating and self-feeding (not including cooking or chewing and swallowing); functional mobility, often referred to as "transferring", as measured by the ability to walk, get in and out of bed, and get into and out of a chair; the broader definition (moving from one place to another while performing activities) is useful for people with different physical abilities who are still able to get around independently. Basic ADLs include the things many people do when they get up in the morning and get ready to go out of the house: get out of bed, go to the toilet, bathe, dress, groom, and eat. On the average, loss of function typically follows a particular order.   Hygiene is the first to go, followed by loss of toilet use and locomotion. The last to go is the ability to eat. When there is only one remaining area in which the person is independent, there is a 62.9% chance that it is eating and only a 3.5% chance  that it is hygiene. Instrumental Activities of Daily Living (IADL or IADLs) are not necessary for fundamental functioning, but they let an individual live independently in a community. IADL consist of tasks that include: cleaning and maintaining the house; home establishment and maintenance; care of others (including selecting and supervising caregivers); care of pets; child rearing; managing money; managing financials (investments, etc.); meal preparation and cleanup; shopping for groceries and necessities; moving within the community; safety procedures and emergency responses; health management and maintenance (taking prescribed medications); and using the telephone or other form of communication.  Instructions:  Most patients tend to report their pain as a combination of two factors, their physical pain and their psychosocial pain. This last one is also known as "suffering" and it is reflection of how physical pain affects you socially and psychologically. From now on, report them separately.  From this point on, when asked to report your pain level, report only your physical pain. Use the following table for reference.  Pain Clinic Pain Levels (0-5/10)  Pain Level Score  Description  No Pain 0   Mild pain 1 Nagging, annoying, but does not interfere with basic activities of daily living (ADL). Patients are able to eat, bathe, get dressed, toileting (being able to get on and off the toilet and perform personal hygiene functions), transfer (move in and out of bed or a chair without assistance), and maintain continence (able to control bladder and bowel functions). Blood pressure and heart rate are unaffected. A normal heart rate for a healthy adult ranges from 60 to 100 bpm (beats per minute).   Mild to moderate pain 2 Noticeable and distracting. Impossible to hide from other people. More frequent flare-ups. Still possible to adapt and function close to normal. It can be very annoying and may have  occasional stronger flare-ups. With discipline, patients may get used to it and adapt.   Moderate pain 3 Interferes significantly with activities of daily living (ADL). It becomes difficult to feed, bathe, get dressed, get on and off the toilet or to perform personal hygiene functions. Difficult to get in and out of bed or a chair without assistance. Very distracting. With effort, it can be ignored when deeply involved in activities.   Moderately severe pain 4 Impossible to ignore for more than a few minutes. With effort, patients may still be able to manage work or participate in some social activities. Very difficult to concentrate. Signs of autonomic nervous system discharge are evident: dilated pupils (mydriasis); mild sweating (diaphoresis); sleep interference. Heart rate becomes elevated (>115 bpm). Diastolic blood pressure (lower number) rises above 100 mmHg. Patients find relief in laying down and not moving.   Severe pain 5 Intense and extremely unpleasant. Associated with frowning face and frequent crying. Pain overwhelms the senses.  Ability to do any activity or maintain social relationships becomes significantly limited. Conversation becomes difficult. Pacing back and forth is common, as getting into a comfortable position is nearly impossible. Pain wakes you up from deep sleep. Physical signs will be obvious: pupillary dilation; increased sweating; goosebumps; brisk reflexes; cold, clammy hands and feet; nausea, vomiting or dry heaves; loss of appetite; significant sleep disturbance with inability to fall asleep or to   remain asleep. When persistent, significant weight loss is observed due to the complete loss of appetite and sleep deprivation.  Blood pressure and heart rate becomes significantly elevated. Caution: If elevated blood pressure triggers a pounding headache associated with blurred vision, then the patient should immediately seek attention at an urgent or emergency care unit, as  these may be signs of an impending stroke.    Emergency Department Pain Levels (6-10/10)  Emergency Room Pain 6 Severely limiting. Requires emergency care and should not be seen or managed at an outpatient pain management facility. Communication becomes difficult and requires great effort. Assistance to reach the emergency department may be required. Facial flushing and profuse sweating along with potentially dangerous increases in heart rate and blood pressure will be evident.   Distressing pain 7 Self-care is very difficult. Assistance is required to transport, or use restroom. Assistance to reach the emergency department will be required. Tasks requiring coordination, such as bathing and getting dressed become very difficult.   Disabling pain 8 Self-care is no longer possible. At this level, pain is disabling. The individual is unable to do even the most "basic" activities such as walking, eating, bathing, dressing, transferring to a bed, or toileting. Fine motor skills are lost. It is difficult to think clearly.   Incapacitating pain 9 Pain becomes incapacitating. Thought processing is no longer possible. Difficult to remember your own name. Control of movement and coordination are lost.   The worst pain imaginable 10 At this level, most patients pass out from pain. When this level is reached, collapse of the autonomic nervous system occurs, leading to a sudden drop in blood pressure and heart rate. This in turn results in a temporary and dramatic drop in blood flow to the brain, leading to a loss of consciousness. Fainting is one of the body's self defense mechanisms. Passing out puts the brain in a calmed state and causes it to shut down for a while, in order to begin the healing process.    Summary: 1. Refer to this scale when providing us with your pain level. 2. Be accurate and careful when reporting your pain level. This will help with your care. 3. Over-reporting your pain level will  lead to loss of credibility. 4. Even a level of 1/10 means that there is pain and will be treated at our facility. 5. High, inaccurate reporting will be documented as "Symptom Exaggeration", leading to loss of credibility and suspicions of possible secondary gains such as obtaining more narcotics, or wanting to appear disabled, for fraudulent reasons. 6. Only pain levels of 5 or below will be seen at our facility. 7. Pain levels of 6 and above will be sent to the Emergency Department and the appointment cancelled. ______________________________________________________________________________________________    ____________________________________________________________________________________________  Weight Management Required  URGENT: Your weight has been found to be adversely affecting your health.  Dear Wendy Valencia:  Your current There is no height or weight on file to calculate BMI.. Estimated body mass index is 37.9 kg/m as calculated from the following:   Height as of 12/26/17: 5\' 7"  (1.702 m).   Weight as of 12/26/17: 242 lb (109.8 kg).  Your last four (4) weight and BMI calculations are as follows: Wt Readings from Last 4 Encounters:  12/26/17 242 lb (109.8 kg)  12/20/17 242 lb (109.8 kg)  10/08/17 233 lb (105.7 kg)  09/25/17 232 lb (105.2 kg)   BMI Readings from Last 4 Encounters:  12/26/17 37.90 kg/m  12/20/17 37.90 kg/m  10/08/17 36.49 kg/m  09/25/17 35.28 kg/m    Calculations estimate your ideal body weight to be: Patient weight not recorded  Please use the table below to identify your weight category and associated incidence of chronic pain, secondary to your weight.  BMI interpretation table: BMI level Category Associated incidence of chronic pain  <18 kg/m2 Underweight   18.5-24.9 kg/m2 Ideal body weight   25-29.9 kg/m2 Overweight  20%  30-34.9 kg/m2 Obese (Class I)  68%  35-39.9 kg/m2 Severe obesity (Class II)  136%  >40 kg/m2 Extreme obesity  (Class III)  254%   In addition: You will be considered "Morbidly Obese", if your BMI is above 30 and you have one or more of the following conditions that are directly associated with obesity: 8. Type 2 Diabetes (Which in turn can lead to cardiovascular diseases (CVD), stroke, peripheral vascular diseases (PVD), retinopathy, nephropathy, and neuropathy) 9. Cardiovascular Disease  10. Breathing problems (Asthma, obesity-hypoventilation syndrome, obstructive sleep apnea, chronic inflammatory airway disease) 11. Chronic kidney disease 12. Liver disease (nonalcoholic fatty liver disease) 13. High blood pressure 14. Acid reflux (gastroesophageal reflux disease) 15. Osteoarthritis (OA) 16. Low back pain (Lumbar Facet Syndrome) 17. Hip pain (Osteoarthritis of hip) 18. Knee pain (Osteoarthritis of knee) (patients with a BMI>30 kg/m2 were 6.8 times more likely to develop knee OA than normal-weight individuals) 19. Certain types of cancer. (Epidemiological studies have shown that obesity is a risk factor for: post-menopausal breast cancer; cancers of the endometrium, colon and kidney; malignant adenomas of the oesophagus. Obese subjects have an approximately 1.5-3.5-fold increased risk of developing these cancers compared with normal-weight subjects, and it has been estimated that between 15 and 45% of these cancers can be attributed to overweight. More recent studies suggest that obesity may also increase the risk of other types of cancer, including pancreatic, hepatic and gallbladder cancer. Ref: Obesity and cancer. Pischon T, Nthlings U, [employer] H. Proc Nutr Soc. 2008 May;67(2):128-45. doi: 10.1017/S0029665108006976.)  Recommendation: At this point it is urgent that you take a step back and concentrate in loosing weight. Because most chronic pain patients do have difficulty exercising secondary to their pain, you must rely on proper nutrition and dieting in order to lose the weight. If your BMI is above  40, you should seriously consider bariatric surgery. A realistic goal is to lose 10% of your body weight over a period of 12 months.  If over time you have unsuccessfully try to lose weight, then it is time for you to seek professional help and to enter a medically supervised weight management program.  Pain management considerations:  1. Pharmacological Problems: Be advised that the use of opioid analgesics has been associated with decreased metabolism and weight gain.  For this reason, should we see that you are unable to lose weight while taking these medications, it may become necessary for [place] to taper down and indefinitely discontinue these medicines.  2. Technical Problems: The incidence of successful interventional therapies decreases as the patient's BMI increases. It is much more difficult to accomplish a safe and effective interventional therapy on a patient with a BMI above 35. Yours is Body mass index is 37.28 kg/m.Marland Kitchen 3. Radiation Exposure Problems: The x-rays machine, used to accomplish injection therapies, will automatically increase their x-ray output in order to capture an appropriate bone image. This means that radiation exposure increases exponentially with the patient's BMI. (The higher the BMI, the higher the radiation exposure.) Although the level of radiation used at a given time is  still safe to the patient, it is not for the physician and/or assisting staff. Unfortunately, radiation exposure is accumulative. Because physicians and the staff have to do procedures and be exposed on a daily basis, this can result in health problems such as cancer and radiation burns. Radiation exposure to the staff is monitored by the radiation batches that they wear. The exposure levels are reported back to the staff on a quarterly basis. Depending on levels of exposure, physicians and staff may be obligated by law to decrease this exposure. This means that they have the right and obligation to refuse  providing therapies where they may be overexposed to radiation. For this reason, physicians may decline to offer therapies such as radiofrequency ablation or implants to patients with a BMI above 40. ____________________________________________________________________________________________  Anti-inflammatory Diet Website  [email or web address]  ____________________________________________________________________________________________  Pain Prevention Technique  Definition:   A technique used to minimize the effects of an activity known to cause inflammation or swelling, which in turn leads to an increase in pain.  Purpose: To prevent swelling from occurring. It is based on the fact that it is easier to prevent swelling from happening than it is to get rid of it, once it occurs.  Contraindications: 1. Anyone with allergy or hypersensitivity to the recommended medications. 2. Anyone taking anticoagulants (Blood Thinners) (e.g., Coumadin, Warfarin, Plavix, etc.). 3. Patients in Renal Failure.  Technique: Before you undertake an activity known to cause pain, or a flare-up of your chronic pain, and before you experience any pain, do the following:  1. On a full stomach, take 4 (four) over the counter Ibuprofens 200mg  tablets (Motrin), for a total of 800 mg. 2. In addition, take over the counter Magnesium 400 to 500 mg, before doing the activity.  3. Six (6) hours later, again on a full stomach, repeat the Ibuprofen. 4. That night, take a warm shower and stretch under the running warm water.  This technique may be sufficient to abort the pain and discomfort before it happens. Keep in mind that it takes a lot less medication to prevent swelling than it takes to eliminate it once it occurs.   ____________________________________________________________________________________________   ____________________________________________________________________________________________  Muscle Spasms & Cramps  Cause:  The most common cause of muscle spasms and cramps is vitamin and/or electrolyte (calcium, potassium, sodium, etc.) deficiencies.  Possible triggers: Sweating - causes loss of electrolytes thru the skin. Steroids - causes loss of electrolytes thru the urine.  Treatment: 20. Gatorade (or any other electrolyte-replenishing drink) - Take 1, 8 oz glass with each meal (3 times a day). 21. OTC (over-the-counter) Magnesium 400 to 500 mg - Take 1 tablet twice a day (one with breakfast and one before bedtime). If you have kidney problems, talk to your primary care physician before taking any Magnesium. 22. Tonic Water with quinine - Take 1, 8 oz glass before bedtime.   ____________________________________________________________________________________________

## 2018-02-03 ENCOUNTER — Other Ambulatory Visit: Payer: Self-pay | Admitting: Adult Health

## 2018-02-03 DIAGNOSIS — M797 Fibromyalgia: Secondary | ICD-10-CM

## 2018-02-03 DIAGNOSIS — G4700 Insomnia, unspecified: Secondary | ICD-10-CM

## 2018-03-10 ENCOUNTER — Other Ambulatory Visit: Payer: Self-pay | Admitting: Adult Health

## 2018-03-10 ENCOUNTER — Other Ambulatory Visit: Payer: Self-pay | Admitting: Physician Assistant

## 2018-03-10 DIAGNOSIS — M797 Fibromyalgia: Secondary | ICD-10-CM

## 2018-03-10 DIAGNOSIS — G4700 Insomnia, unspecified: Secondary | ICD-10-CM

## 2018-03-11 ENCOUNTER — Other Ambulatory Visit: Payer: Self-pay | Admitting: Adult Health

## 2018-03-18 ENCOUNTER — Encounter: Payer: Managed Care, Other (non HMO) | Admitting: Nurse Practitioner

## 2018-03-20 ENCOUNTER — Other Ambulatory Visit: Payer: Self-pay

## 2018-03-20 ENCOUNTER — Ambulatory Visit: Payer: BLUE CROSS/BLUE SHIELD | Attending: Nurse Practitioner | Admitting: Nurse Practitioner

## 2018-03-20 ENCOUNTER — Encounter: Payer: Self-pay | Admitting: Nurse Practitioner

## 2018-03-20 VITALS — BP 130/79 | HR 88 | Temp 97.8°F | Ht 68.0 in | Wt 238.0 lb

## 2018-03-20 DIAGNOSIS — M47816 Spondylosis without myelopathy or radiculopathy, lumbar region: Secondary | ICD-10-CM

## 2018-03-20 DIAGNOSIS — M25552 Pain in left hip: Secondary | ICD-10-CM

## 2018-03-20 DIAGNOSIS — M797 Fibromyalgia: Secondary | ICD-10-CM

## 2018-03-20 DIAGNOSIS — M25511 Pain in right shoulder: Secondary | ICD-10-CM | POA: Diagnosis not present

## 2018-03-20 DIAGNOSIS — R52 Pain, unspecified: Secondary | ICD-10-CM

## 2018-03-20 DIAGNOSIS — M25551 Pain in right hip: Secondary | ICD-10-CM | POA: Diagnosis not present

## 2018-03-20 DIAGNOSIS — G894 Chronic pain syndrome: Secondary | ICD-10-CM

## 2018-03-20 DIAGNOSIS — M25512 Pain in left shoulder: Secondary | ICD-10-CM

## 2018-03-20 DIAGNOSIS — G8929 Other chronic pain: Secondary | ICD-10-CM

## 2018-03-20 MED ORDER — TRAMADOL HCL 50 MG PO TABS: 50.0000 mg | ORAL_TABLET | Freq: Four times a day (QID) | ORAL | 5 refills | Status: DC | PRN

## 2018-03-20 MED ORDER — TRAMADOL HCL 50 MG PO TABS: 50.0000 mg | ORAL_TABLET | Freq: Four times a day (QID) | ORAL | 2 refills | Status: DC | PRN

## 2018-03-20 NOTE — Progress Notes (Signed)
Patient's Name: Wendy Valencia  MRN: [id number]  Referring Provider: Vicie Mutters, PA-C  DOB: 1965-09-25  PCP: Vicie Mutters, PA-C  DOS: 03/20/2018  Note by: Dionisio David, NP  Service setting: Ambulatory outpatient  Specialty: Interventional Pain Management  Location: [facility] (AMB) Pain Management Facility    Patient type: Established   HPI  Reason for Visit: [name] is a 53 y.o. year old, female patient, who comes today with a chief complaint of Hip Pain (both) Last Appointment: Her last appointment at our practice was on 01/29/2018. I last saw her on 12/26/2017.  Pain Assessment: Today, [name] describes the severity of the Chronic pain as a 3 /10. She indicates the location/referral of the pain to be Hip Right, Left/pain radiaties down to both thigh. Onset was: More than a month ago. The quality of pain is described as Aching, Sharp, Radiating. Temporal description, or timing of pain is: Constant. Possible modifying factors: medication, Tens units. [name]'s  height is _0  (1.727 m) and weight is 238 lb (108 kg). Her temperature is 97.8 F (36.6 C). Her blood pressure is 130/79 and her pulse is 88. Her oxygen saturation is 99%.  She admits that her medication is effective for maintaining but not effective for flare ups.    Controlled Substance Pharmacotherapy Assessment REMS (Risk Evaluation and Mitigation Strategy)  Analgesic:tramadol 50 mg 2 tablets twice daily (fill date 04/01/2017) tramadol 200 mg per day MME/day:52m/day BChauncey Fischer RN  03/20/2018  1:56 PM  Sign when Signing Visit Nursing Pain Medication Assessment:  Safety precautions to be maintained throughout the outpatient stay will include: orient to surroundings, keep bed in low position, maintain call bell within reach at all times, provide assistance with transfer out of bed and ambulation.  Medication Inspection Compliance: Ms. NGottsdid not comply with our request to bring her  pills to be counted. She was reminded that bringing the medication bottles, even when empty, is a requirement.  Medication: None brought in. Pill/Patch Count: None available to be counted. Bottle Appearance: No container available. Did not bring bottle(s) to appointment. Filled Date: N/A Last Medication intake:  Today   Pharmacokinetics: Liberation and absorption (onset of action): WNL Distribution (time to peak effect): WNL Metabolism and excretion (duration of action): WNL         Pharmacodynamics: Desired effects: Analgesia: [name] >50% benefit. Functional ability: Patient Valencia that medication allows her to accomplish basic ADLs Clinically meaningful improvement in function (CMIF): Sustained CMIF goals met Perceived effectiveness: Described as relatively effective, allowing for increase in activities of daily living (ADL) Undesirable effects: Side-effects or Adverse reactions: None reported Monitoring: Wendy Valencia PMP: Online review of the past 114-montheriod conducted. Compliant with practice rules and regulations Last UDS on record: Summary  Date Value Ref Range Status  12/26/2017 FINAL  Final    Comment:    ==================================================================== TOXASSURE SELECT 13 (MW) ==================================================================== Test                             Result       Flag       Units Drug Present and Declared for Prescription Verification   Tramadol                       >8197        EXPECTED   ng/mg creat   O-Desmethyltramadol            >  8197        EXPECTED   ng/mg creat   N-Desmethyltramadol            6198         EXPECTED   ng/mg creat    Source of tramadol is a prescription medication.    O-desmethyltramadol and N-desmethyltramadol are expected    metabolites of tramadol. Drug Present not Declared for Prescription Verification   Lorazepam                      457          UNEXPECTED ng/mg creat    Source of  lorazepam is a scheduled prescription medication. ==================================================================== Test                      Result    Flag   Units      Ref Range   Creatinine              61               mg/dL      >=20 ==================================================================== Declared Medications:  The flagging and interpretation on this report are based on the  following declared medications.  Unexpected results may arise from  inaccuracies in the declared medications.  **Note: The testing scope of this panel includes these medications:  Tramadol (Ultram)  **Note: The testing scope of this panel does not include following  reported medications:  Acetaminophen  Albuterol  Aspirin  Benzonatate (Tessalon)  Betamethasone (Diprolene)  Caffeine  Cetirizine (Zyrtec D)  Chondroitin (Glucosamine-Chondroitin)  Dextromethorphan  Diphenhydramine (Benadryl)  Doxepin (Sinequan)  Duloxetine (Cymbalta)  Eye Drops  Gabapentin (Neurontin)  Glucosamine (Glucosamine-Chondroitin)  Hydrochlorothiazide (Lisinopril-HCTZ)  Iron (Ferrous Sulfate)  Lisinopril (Lisinopril-HCTZ)  Magnesium  Medroxyprogesterone (Provera)  Meloxicam (Mobic)  Metoclopramide (Reglan)  Multivitamin (MVI)  Naproxen (Naprosyn)  Phentermine (Adipex-P)  Promethazine  Pseudoephedrine (Zyrtec D)  Supplement  Topical  Valacyclovir (Valtrex)  Vitamin D2 (Drisdol) ==================================================================== For clinical consultation, please call [phone]. ====================================================================    UDS interpretation: Compliant          Medication Assessment Form: Reviewed. Patient indicates being compliant with therapy Treatment compliance: Compliant Risk Assessment Profile: Aberrant behavior: See initial evaluations. None observed or detected today Comorbid factors increasing risk of overdose: See initial evaluation. No  additional risks detected today Opioid risk tool (ORT):  Opioid Risk  03/20/2018  Alcohol 0  Illegal Drugs 0  Rx Drugs 0  Alcohol 0  Illegal Drugs 0  Rx Drugs 0  Age between 16-45 years  0  History of Preadolescent Sexual Abuse 0  Psychological Disease 0  Depression 0  Opioid Risk Tool Scoring 0  Opioid Risk Interpretation Low Risk    ORT Scoring interpretation table:  Score <3 = Low Risk for SUD  Score between 4-7 = Moderate Risk for SUD  Score >8 = High Risk for Opioid Abuse   Risk of substance use disorder (SUD): Low  Risk Mitigation Strategies:  Patient Counseling: Covered Patient-Prescriber Agreement (PPA): Present and active  Notification to other healthcare providers: Done  Pharmacologic Plan: No change in therapy, at this time.             ROS  Constitutional: Denies any fever or chills Gastrointestinal: No reported hemesis, hematochezia, vomiting, or acute GI distress Musculoskeletal: Denies any acute onset joint swelling, redness, loss of ROM, or weakness Neurological: No reported episodes of acute onset apraxia, aphasia, dysarthria,  agnosia, amnesia, paralysis, loss of coordination, or loss of consciousness  Medication Review  Albuterol Sulfate, Aspirin-Acetaminophen-Caffeine, DULoxetine, Ginkgo Biloba, Glucosamine-Chondroitin, Magnesium, Turmeric, Vitamin D (Ergocalciferol), aerochamber plus with mask, augmented betamethasone dipropionate, benzonatate, betamethasone dipropionate, cetirizine-pseudoephedrine, ciprofloxacin, diphenhydrAMINE, doxepin, ferrous sulfate, gabapentin, lisinopril-hydrochlorothiazide, medroxyPROGESTERone, meloxicam, metoCLOPramide, multivitamin with minerals, naproxen, phentermine, promethazine-dextromethorphan, traMADol, and valACYclovir  History Review  Allergy: Ms. Bachtel has No Known Allergies. Drug: Ms. Cutshaw  Valencia no history of drug use. Alcohol:  Valencia current alcohol use of about 1.0 standard drinks of alcohol per  week. Tobacco:  Valencia that she has been smoking cigarettes. She has a 1.75 pack-year smoking history. She has never used smokeless tobacco. Social: Ms. Totzke  Valencia that she has been smoking cigarettes. She has a 1.75 pack-year smoking history. She has never used smokeless tobacco. She Valencia current alcohol use of about 1.0 standard drinks of alcohol per week. She Valencia that she does not use drugs. Medical:  has a past medical history of Allergy, Anemia, Anxiety, Arthritis, Fibromyalgia, GERD (gastroesophageal reflux disease), Hypertension, IBS (irritable bowel syndrome), Nephrolithiasis, and Plantar fasciitis of right foot. Surgical: Ms. Halley  has a past surgical history that includes Left shoulder arthroscopic surgery (yrs ago); tummy tuck (2005); Breath tek h pylori (07/11/2011); Gastric Roux-En-Y (10/01/2011); Cholecystectomy (10/01/2011); and Colonoscopy. Family: family history includes Breast cancer (age of onset: 54) in her mother; Breast cancer (age of onset: 26) in her maternal aunt; Cancer in her maternal grandmother; Cancer (age of onset: 29) in her mother; Heart attack in her maternal grandfather and paternal grandfather; Heart disease in her father and maternal grandfather; Hypertension in her father; Lung cancer in her maternal grandmother; Multiple sclerosis in her maternal uncle; Obesity in her brother; Stroke in her father. Problem List: Ms. Dromgoole has Fibromyalgia affecting multiple sites (Secondary Area of Pain); Chronic generalized pain (Primary Area of Pain); Chronic pain syndrome; Chronic shoulder pain (Tertiary Area of Pain) (Bilateral) (R>L); and Chronic hip pain (Fourth Area of Pain) (Bilateral) (R>L) on their pertinent problem list.  Lab Review  Kidney Function Lab Results  Component Value Date   BUN 12 12/20/2017   CREATININE 0.63 16/96/7893   BCR NOT APPLICABLE 81/01/7508   GFRAA 120 12/20/2017   GFRNONAA 104 12/20/2017  Liver Function Lab Results   Component Value Date   AST 15 12/20/2017   ALT 13 12/20/2017   ALBUMIN 4.5 03/05/2016  Note: Above Lab results reviewed.  Imaging Review  US PELVIC COMPLETE WITH TRANSVAGINAL CLINICAL DATA:  Postmenopausal bleeding.  EXAM: TRANSABDOMINAL AND TRANSVAGINAL ULTRASOUND OF PELVIS  TECHNIQUE: Both transabdominal and transvaginal ultrasound examinations of the pelvis were performed. Transabdominal technique was performed for global imaging of the pelvis including uterus, ovaries, adnexal regions, and pelvic cul-de-sac. It was necessary to proceed with endovaginal exam following the transabdominal exam to visualize the endometrium and ovaries.  COMPARISON:  None  FINDINGS: Uterus  Measurements: 9.6 x 4.6 x 6.1 cm.  No discrete mass.  Endometrium  Thickness: 6.8 mm.  No focal abnormality visualized.  Right ovary  Measurements: 3.2 x 2.3 x 2.7 cm. Normal appearance/no adnexal mass.  Left ovary  Measurements: 3.6 x 3.5 x 3.0 cm. There is a cyst in the left ovary measuring 3.4 x 2.6 x 2.3 cm. This cyst contains a single septation which demonstrates blood flow. No solid nodular components.  Other findings  No abnormal free fluid.  IMPRESSION: 1. Endometrial thickening given postmenopausal state. In the setting of post-menopausal bleeding, endometrial sampling is indicated to exclude carcinoma.  If results are benign, sonohysterogram should be considered for focal lesion work-up. (Ref: Radiological Reasoning: Algorithmic Workup of Abnormal Vaginal Bleeding with Endovaginal Sonography and Sonohysterography. AJR 2008; 248:G50-03) 2. 3.4 x 2.6 x 2.3 cm septated cyst in the left ovary. There is a single septation of borderline thickness. There is blood flow within the septation. Recommend gynecologic consultation. An MRI could further assess as clinically warranted.  These results will be called to the ordering clinician or representative by the Radiologist Assistant, and  communication documented in the PACS or zVision Dashboard.  Electronically Signed   By: Dorise Bullion III M.D   On: 09/21/2017 12:26 Note: Reviewed        Physical Exam  General appearance: alert, cooperative and in no distress Mental status: Alert, oriented x 3 (person, place, & time)       Respiratory: No evidence of acute respiratory distress Eyes: PERLA Vitals: BP 130/79   Pulse 88   Temp 97.8 F (36.6 C)   Ht _0  (1.727 m)   Wt 238 lb (108 kg)   SpO2 99%   BMI 36.19 kg/m  BMI: Estimated body mass index is 36.19 kg/m as calculated from the following:   Height as of this encounter: _1  (1.727 m).   Weight as of this encounter: 238 lb (108 kg). Ideal: Ideal body weight: 63.9 kg (140 lb 14 oz) Adjusted ideal body weight: 81.5 kg (179 lb 11.6 oz) Gait & Posture Assessment  Ambulation: Unassisted Gait: Relatively normal for age and body habitus Posture: WNL  Lower Extremity Exam    Side: Right lower extremity  Side: Left lower extremity  Stability: No instability observed          Stability: No instability observed          Skin & Extremity Inspection: Skin color, temperature, and hair growth are WNL. No peripheral edema or cyanosis. No masses, redness, swelling, asymmetry, or associated skin lesions. No contractures.  Skin & Extremity Inspection: Skin color, temperature, and hair growth are WNL. No peripheral edema or cyanosis. No masses, redness, swelling, asymmetry, or associated skin lesions. No contractures.  Functional ROM: Unrestricted ROM                  Functional ROM: Unrestricted ROM                  Muscle Tone/Strength: Functionally intact. No obvious neuro-muscular anomalies detected.  Muscle Tone/Strength: Functionally intact. No obvious neuro-muscular anomalies detected.  Sensory (Neurological): Unimpaired        Sensory (Neurological): Unimpaired        DTR: Patellar: deferred today Achilles: deferred today Plantar: deferred today  DTR: Patellar:  deferred today Achilles: deferred today Plantar: deferred today  Palpation: No palpable anomalies  Palpation: No palpable anomalies   Assessment   Status Diagnosis  Controlled Controlled Controlled 1. Spondylosis of lumbar spine   2. Fibromyalgia affecting multiple sites (Secondary Area of Pain)   3. Chronic hip pain (Fourth Area of Pain) (Bilateral) (R>L)   4. Chronic shoulder pain (Tertiary Area of Pain) (Bilateral) (R>L)   5. Chronic pain syndrome   6. Chronic generalized pain (Primary Area of Pain)      Updated Problems: No problems updated.  Plan of Care  Pharmacotherapy (Medications Ordered): Meds ordered this encounter  Medications  . DISCONTD: traMADol (ULTRAM) 50 MG tablet    Sig: Take 1 tablet (50 mg total) by mouth every 6 (six) hours as needed for severe pain.  Dispense:  120 tablet    Refill:  2    Do not place this medication on "Automatic Refill". Patient may have prescription filled one day early if pharmacy is closed on scheduled refill date. Do not fill until: To last until:    Order Specific Question:   Supervising Provider    Answer:   Milinda Pointer [phone]  . traMADol (ULTRAM) 50 MG tablet    Sig: Take 1 tablet (50 mg total) by mouth every 6 (six) hours as needed for severe pain.    Dispense:  120 tablet    Refill:  5    Please void previous Tramadol with 2 refills.    Order Specific Question:   Supervising Provider    Answer:   Milinda Pointer [[id number]]   Administered today: Kathie Dike. Yount had no medications administered during this visit.  Orders:  No orders of the defined types were placed in this encounter.  Follow-up plan:   Return in about 6 months (around 09/20/2018) for MedMgmt..Not at this time.    Interventional options: Considering: Diagnostic bilateral intra-articular shoulder joint injection Diagnostic bilateral suprascapular nerve block Possible bilateral suprascapular nerve RFA Diagnostic bilateral  intra-articular hip joint injection Diagnostic/therapeutic bilateral trochanteric bursa injection Diagnostic bilateral femoral nerve +obturator nerve block Possible bilateral femoral nerve + obturator nerve RFA Diagnostic/therapeuticLidocaine infusion Diagnostic/therapeuticTrigger point injection Diagnostic bilateral cervical facet block Diagnostic bilateral cervical facet RFA   Palliative PRN treatment(s): Diagnostic bilateralintra-articularhipjoint injection#2+ bilateral trochanteric bursa injection #2, under fluoroscopic guidance and IV sedation    Note by: Dionisio David, NP Date: 03/20/2018; Time: 10:16 PM

## 2018-03-20 NOTE — Patient Instructions (Signed)
____________________________________________________________________________________________  Medication Rules  Purpose: To inform patients, and their family members, of our rules and regulations.  Applies to: All patients receiving prescriptions (written or electronic).  Pharmacy of record: Pharmacy where electronic prescriptions will be sent. If written prescriptions are taken to a different pharmacy, please inform the nursing staff. The pharmacy listed in the electronic medical record should be the one where you would like electronic prescriptions to be sent.  Electronic prescriptions: In compliance with the [place] Strengthen Opioid Misuse Prevention (STOP) Act of 2017 (Session Law 2017-74/H243), effective January 15, 2018, all controlled substances must be electronically prescribed. Calling prescriptions to the pharmacy will cease to exist.  Prescription refills: Only during scheduled appointments. Applies to all prescriptions.  NOTE: The following applies primarily to controlled substances (Opioid* Pain Medications).   Patient's responsibilities: 1. Pain Pills: Bring all pain pills to every appointment (except for procedure appointments). 2. Pill Bottles: Bring pills in original pharmacy bottle. Always bring the newest bottle. Bring bottle, even if empty. 3. Medication refills: You are responsible for knowing and keeping track of what medications you take and those you need refilled. The day before your appointment: write a list of all prescriptions that need to be refilled. The day of the appointment: give the list to the admitting nurse. Prescriptions will be written only during appointments. No prescriptions will be written on procedure days. If you forget a medication: it will not be "Called in", "Faxed", or "electronically sent". You will need to get another appointment to get these prescribed. No early refills. Do not call asking to have your prescription filled  early. 4. Prescription Accuracy: You are responsible for carefully inspecting your prescriptions before leaving our office. Have the discharge nurse carefully go over each prescription with you, before taking them home. Make sure that your name is accurately spelled, that your address is correct. Check the name and dose of your medication to make sure it is accurate. Check the number of pills, and the written instructions to make sure they are clear and accurate. Make sure that you are given enough medication to last until your next medication refill appointment. 5. Taking Medication: Take medication as prescribed. When it comes to controlled substances, taking less pills or less frequently than prescribed is permitted and encouraged. Never take more pills than instructed. Never take medication more frequently than prescribed.  6. Inform other Doctors: Always inform, all of your healthcare providers, of all the medications you take. 7. Pain Medication from other Providers: You are not allowed to accept any additional pain medication from any other Doctor or Healthcare provider. There are two exceptions to this rule. (see below) In the event that you require additional pain medication, you are responsible for notifying us, as stated below. 8. Medication Agreement: You are responsible for carefully reading and following our Medication Agreement. This must be signed before receiving any prescriptions from our practice. Safely store a copy of your signed Agreement. Violations to the Agreement will result in no further prescriptions. (Additional copies of our Medication Agreement are available upon request.) 9. Laws, Rules, & Regulations: All patients are expected to follow all Federal and State Laws, Statutes, Rules, & Regulations. Ignorance of the Laws does not constitute a valid excuse. The use of any illegal substances is prohibited. 10. Adopted CDC guidelines & recommendations: Target dosing levels will be  at or below 60 MME/day. Use of benzodiazepines** is not recommended.  Exceptions: There are only two exceptions to the rule of not   receiving pain medications from other Healthcare Providers. 1. Exception #1 (Emergencies): In the event of an emergency (i.e.: accident requiring emergency care), you are allowed to receive additional pain medication. However, you are responsible for: As soon as you are able, call our office [phone], at any time of the day or night, and leave a message stating your name, the date and nature of the emergency, and the name and dose of the medication prescribed. In the event that your call is answered by a member of our staff, make sure to document and save the date, time, and the name of the person that took your information.  2. Exception #2 (Planned Surgery): In the event that you are scheduled by another doctor or dentist to have any type of surgery or procedure, you are allowed (for a period no longer than 30 days), to receive additional pain medication, for the acute post-op pain. However, in this case, you are responsible for picking up a copy of our "Post-op Pain Management for Surgeons" handout, and giving it to your surgeon or dentist. This document is available at our office, and does not require an appointment to obtain it. Simply go to our office during business hours (Monday-Thursday from 8:00 AM to 4:00 PM) (Friday 8:00 AM to 12:00 Noon) or if you have a scheduled appointment with us, prior to your surgery, and ask for it by name. In addition, you will need to provide us with your name, name of your surgeon, type of surgery, and date of procedure or surgery.  *Opioid medications include: morphine, codeine, oxycodone, oxymorphone, hydrocodone, hydromorphone, meperidine, tramadol, tapentadol, buprenorphine, fentanyl, methadone. **Benzodiazepine medications include: diazepam (Valium), alprazolam (Xanax), clonazepam (Klonopine), lorazepam (Ativan), clorazepate  (Tranxene), chlordiazepoxide (Librium), estazolam (Prosom), oxazepam (Serax), temazepam (Restoril), triazolam (Halcion) (Last updated: 03/14/2017) ____________________________________________________________________________________________    

## 2018-03-20 NOTE — Progress Notes (Signed)
Nursing Pain Medication Assessment:  Safety precautions to be maintained throughout the outpatient stay will include: orient to surroundings, keep bed in low position, maintain call bell within reach at all times, provide assistance with transfer out of bed and ambulation.  Medication Inspection Compliance: Wendy Valencia did not comply with our request to bring her pills to be counted. She was reminded that bringing the medication bottles, even when empty, is a requirement.  Medication: None brought in. Pill/Patch Count: None available to be counted. Bottle Appearance: No container available. Did not bring bottle(s) to appointment. Filled Date: N/A Last Medication intake:  Today 

## 2018-03-28 ENCOUNTER — Other Ambulatory Visit: Payer: Self-pay | Admitting: Physician Assistant

## 2018-04-10 ENCOUNTER — Other Ambulatory Visit: Payer: Self-pay | Admitting: Adult Health

## 2018-04-10 DIAGNOSIS — M797 Fibromyalgia: Secondary | ICD-10-CM

## 2018-04-10 DIAGNOSIS — G4700 Insomnia, unspecified: Secondary | ICD-10-CM

## 2018-04-21 ENCOUNTER — Encounter: Payer: Self-pay | Admitting: Adult Health

## 2018-04-21 ENCOUNTER — Other Ambulatory Visit: Payer: Self-pay | Admitting: Adult Health

## 2018-04-21 DIAGNOSIS — D649 Anemia, unspecified: Secondary | ICD-10-CM | POA: Insufficient documentation

## 2018-04-22 ENCOUNTER — Other Ambulatory Visit: Payer: Self-pay | Admitting: Adult Health

## 2018-04-22 DIAGNOSIS — G4709 Other insomnia: Secondary | ICD-10-CM

## 2018-04-22 MED ORDER — SUVOREXANT 10 MG PO TABS: 10.0000 mg | ORAL_TABLET | Freq: Every evening | ORAL | 0 refills | Status: DC | PRN

## 2018-04-23 NOTE — Progress Notes (Signed)
FOLLOW UP  Assessment and Plan:   Hypertension Well controlled with current medications  Monitor blood pressure at home; patient to call if consistently greater than 130/80 Continue DASH diet.   Reminder to go to the ER if any CP, SOB, nausea, dizziness, severe HA, changes vision/speech, left arm numbness and tingling and jaw pain.  Cholesterol Currently at goal; continue lifestyle Continue low cholesterol diet and exercise.  Defer lipid panel per patient request  Prediabetes Continue diet and exercise.  Perform daily foot/skin check, notify office of any concerning changes.  Check A1C  Morbid Obesity with co morbidities Long discussion about weight loss, diet, and exercise Recommended diet heavy in fruits and veggies and low in animal meats, cheeses, and dairy products, appropriate calorie intake Discussed ideal weight for height Patient will work on awareness for emotional eating, discussed CBT which she is agreeable to trying, discussed being aware of caloric density, emphasized quality protein intake, resistance exercise to build muscle mass and increase baseline metabolism Patient on phentermine with benefit and no SE, taking drug breaks; continue close follow up. Return in 4 months.   Vitamin D Def Below goal at last visit; she is taking 78295 IU twice weekly  continue supplementation to maintain goal of 70-100 Check Vit D level to next visit  Tobacco use Discussed risks associated with tobacco use and advised to reduce or quit Patient is not ready to do so, but advised to consider strongly Will follow up at the next visit  Fibromyalgia Continue follow up with pain management  gabapentin to 300-600 mg TID PRN, this has been helpful Wants trial of cymbalta 90 mg; 30 mg caps sent in to take with 60 mg caps - she will message back in 1 month with progress Lifestyle discussed: diet/exerise, sleep hygiene, stress management, hydration Recommended CBT; she is in agreement  and will reach out to insurance for approved provider  Insomnia  Has failed multiple agents in the past; she requests retrial of eszopiclone today; belsomra was not covered by insurance; 1 mg tabs sent in to try 1-2 mg qHS PRN, message back in 30 days with progress, can increase to 3 mg at that time if needed good sleep hygiene discussed, increase day time activity, try melatonin or benadryl if this does not help we will call in sleep medication.    Continue diet and meds as discussed. Further disposition pending results of labs. Discussed med's effects and SE's.   Over 30 minutes of exam, counseling, chart review, and critical decision making was performed.   Future Appointments  Date Time Provider Department Center  09/03/2018  9:00 AM Quentin Mulling, PA-C [facility] None  09/25/2018  9:00 AM Barbette Merino, NP [facility] None    ----------------------------------------------------------------------------------------------------------------------  HPI 53 y.o. female  presents for 3 month follow up on hypertension, cholesterol, prediabetes, morbid obesity and vitamin D deficiency.   She has hx of anxiety, chronic pain, DJD and follows with pain clinic, Dr. Laban Emperor, taking cymbalta 60 mg, tramadol.  She is also prescribed gabapentin though our office 300-600 mg TID. She reqeusts trial of increased dose of cymbalta which she feels isn't working as well as it once was; discussed minimal benefit typically noted beyond 60 mg dose but she still wishes to proceed with 90 mg dose trial.   She was taking doxepin at night for sleep which was working well, but recently reached out to office reporting no longer effective. She has failed trazodone, ambien, lunestra in the past. She wanted  to try benzo or belsomra, advised not a candidate for benzo due to opioid use, and reviewed new guidelines suggest the benzodiazepines are best short term, with prolonged use they lead to physical and psychological  dependence. We did send in a script for belsomra to try, but have advised should this not be effective would recommend evaluation by sleep specialist. Belsomra wasn't covered by insurance, patient would like to retry lunestra.   she currently continues to smoke 0.25 mg pack a day; discussed risks associated with smoking, patient is not ready to quit.   BMI is Body mass index is 37.68 kg/m., she has been working on diet and exercise. She had roux-en-Y in 2013 and lost 80 lb down from 260 to 180 lb but in past few years has been gaining back.   she is prescribed phentermine for weight loss but has been off of phentermine for 2 weeks taking a drug holiday. While on the medication they have lost 0 lbs since last visit. They deny palpitations, anxiety,  elevated BP, has been walking and doing calisthenics. She admits she has been stress eating recently, struggles with gnawing in stomach if empty stomach and grazes during the day. Phentermine has worked well for her in the past.   BMI is Body mass index is 37.68 kg/m., she is working on diet and exercise. Wt Readings from Last 3 Encounters:  04/24/18 247 lb 12.8 oz (112.4 kg)  03/20/18 238 lb (108 kg)  01/29/18 238 lb (108 kg)   She has not been checking BP at home due to typically being well controlled, today their BP is BP: 134/80  She does workout. She denies chest pain, shortness of breath, dizziness.   She is not on cholesterol medication. Her cholesterol is at goal. The cholesterol last visit was:   Lab Results  Component Value Date   CHOL 173 09/02/2017   HDL 80 09/02/2017   LDLCALC 77 09/02/2017   TRIG 80 09/02/2017   CHOLHDL 2.2 09/02/2017    She has been working on diet and exercise for prediabetes, and denies increased appetite, nausea, paresthesia of the feet, polydipsia, polyuria, visual disturbances and vomiting. Last A1C in the office was:  Lab Results  Component Value Date   HGBA1C 5.9 (H) 12/20/2017   Patient is on Vitamin  D supplement, taking 43154 twice weekly Lab Results  Component Value Date   VD25OH 34 09/02/2017        Current Medications:  Current Outpatient Medications on File Prior to Visit  Medication Sig  . Albuterol Sulfate (PROVENTIL HFA IN) Inhale 108 mcg into the lungs as needed.  . Aspirin-Acetaminophen-Caffeine (EXCEDRIN EXTRA STRENGTH PO) Take 1 tablet by mouth 3 (three) times daily as needed (headache).   . cetirizine-pseudoephedrine (ZYRTEC-D) 5-120 MG tablet Take 1 tablet by mouth every 12 (twelve) hours as needed for allergies.   . diphenhydrAMINE (BENADRYL) 25 MG tablet Take 25-50 mg by mouth 3 (three) times daily as needed for allergies.  . DULoxetine (CYMBALTA) 60 MG capsule TAKE ONE CAPSULE BY MOUTH DAILY  . ferrous sulfate 325 (65 FE) MG EC tablet Take 325 mg by mouth at bedtime.   . gabapentin (NEURONTIN) 300 MG capsule TAKE 1-2 CAPSULES UP TO THREE TIMES A DAY AS NEEDED FOR PAIN  . Glucosamine-Chondroitin (COSAMIN DS PO) Take 1,500 mg by mouth daily.  Marland Kitchen lisinopril-hydrochlorothiazide (PRINZIDE,ZESTORETIC) 20-12.5 MG tablet TAKE 1 TABLET BY MOUTH EVERY DAY  . Magnesium 500 MG TABS Take 500 mg by mouth 2 (two)  times daily.   . Multiple Vitamins-Minerals (MULTIVITAMIN WITH MINERALS) tablet Take 1 tablet by mouth daily. .  . naproxen (NAPROSYN) 500 MG tablet Take 1 tablet (500 mg total) by mouth 2 (two) times daily.  Marland Kitchen. Spacer/Aero-Holding Chambers (AEROCHAMBER PLUS WITH MASK) inhaler Use as instructed  . traMADol (ULTRAM) 50 MG tablet Take 1 tablet (50 mg total) by mouth every 6 (six) hours as needed for severe pain.  . TURMERIC PO Take 1,000 mg by mouth daily.  . valACYclovir (VALTREX) 500 MG tablet Take 500 mg by mouth as needed.  . Vitamin D, Ergocalciferol, (DRISDOL) 1.25 MG (50000 UT) CAPS capsule TAKE ONE CAPSULE BY MOUTH TWICE WEEKLY  . phentermine (ADIPEX-P) 37.5 MG tablet TAKE ONE TABLET BY MOUTH DAILY (Patient not taking: Reported on 04/24/2018)   No current  facility-administered medications on file prior to visit.      Allergies: No Known Allergies   Medical History:  Past Medical History:  Diagnosis Date  . Allergy   . Anemia   . Anxiety   . Arthritis   . Fibromyalgia   . GERD (gastroesophageal reflux disease)   . Hypertension   . IBS (irritable bowel syndrome)   . Nephrolithiasis   . Plantar fasciitis of right foot    wears boot    Family history- Reviewed and unchanged Social history- Reviewed and unchanged   Review of Systems:  Review of Systems  Constitutional: Negative for malaise/fatigue and weight loss.  HENT: Negative for hearing loss and tinnitus.   Eyes: Negative for blurred vision and double vision.  Respiratory: Negative for cough, shortness of breath and wheezing.   Cardiovascular: Negative for chest pain, palpitations, orthopnea, claudication and leg swelling.  Gastrointestinal: Negative for abdominal pain, blood in stool, constipation, diarrhea, heartburn, melena, nausea and vomiting.  Genitourinary: Negative.   Musculoskeletal: Positive for joint pain and myalgias.  Skin: Negative for rash.  Neurological: Negative for dizziness, tingling, sensory change, weakness and headaches.  Endo/Heme/Allergies: Negative for polydipsia.  Psychiatric/Behavioral: Negative for depression, substance abuse and suicidal ideas. The patient has insomnia. The patient is not nervous/anxious.   All other systems reviewed and are negative.   Physical Exam: BP 134/80   Pulse 86   Temp (!) 97.5 F (36.4 C)   Ht 5\' 8"  (1.727 m)   Wt 247 lb 12.8 oz (112.4 kg)   SpO2 97%   BMI 37.68 kg/m  Wt Readings from Last 3 Encounters:  04/24/18 247 lb 12.8 oz (112.4 kg)  03/20/18 238 lb (108 kg)  01/29/18 238 lb (108 kg)   General Appearance: Well nourished, obese female in no apparent distress. Eyes: PERRLA, EOMs, conjunctiva no swelling or erythema Sinuses: No Frontal/maxillary tenderness ENT/Mouth: Ext aud canals clear, TMs  without erythema, bulging. No erythema, swelling, or exudate on post pharynx.  Tonsils not swollen or erythematous. Hearing normal.  Neck: Supple, thyroid normal.  Respiratory: Respiratory effort normal, BS equal bilaterally without rales, rhonchi, wheezing or stridor.  Cardio: RRR with no MRGs. Brisk peripheral pulses without edema.  Abdomen: Soft, obese abdomen, + BS.  Non tender, no guarding, rebound, hernias, masses. Lymphatics: Non tender without lymphadenopathy.  Musculoskeletal: Full ROM, 5/5 strength, Normal gait Skin: Warm, dry without rashes, lesions, ecchymosis.  Neuro: Cranial nerves intact. No cerebellar symptoms.  Psych: Awake and oriented X 3, normal affect, Insight and Judgment appropriate.    Dan MakerAshley C Willman Cuny, NP 12:54 PM [facility]

## 2018-04-24 ENCOUNTER — Other Ambulatory Visit: Payer: Self-pay

## 2018-04-24 ENCOUNTER — Ambulatory Visit: Payer: Self-pay | Admitting: Adult Health

## 2018-04-24 ENCOUNTER — Encounter: Payer: Self-pay | Admitting: Adult Health

## 2018-04-24 ENCOUNTER — Ambulatory Visit (INDEPENDENT_AMBULATORY_CARE_PROVIDER_SITE_OTHER): Payer: BLUE CROSS/BLUE SHIELD | Admitting: Adult Health

## 2018-04-24 VITALS — BP 134/80 | HR 86 | Temp 97.5°F | Ht 68.0 in | Wt 247.8 lb

## 2018-04-24 DIAGNOSIS — Z720 Tobacco use: Secondary | ICD-10-CM

## 2018-04-24 DIAGNOSIS — I10 Essential (primary) hypertension: Secondary | ICD-10-CM

## 2018-04-24 DIAGNOSIS — E559 Vitamin D deficiency, unspecified: Secondary | ICD-10-CM

## 2018-04-24 DIAGNOSIS — Z79899 Other long term (current) drug therapy: Secondary | ICD-10-CM

## 2018-04-24 DIAGNOSIS — D509 Iron deficiency anemia, unspecified: Secondary | ICD-10-CM

## 2018-04-24 DIAGNOSIS — G4709 Other insomnia: Secondary | ICD-10-CM

## 2018-04-24 DIAGNOSIS — R7303 Prediabetes: Secondary | ICD-10-CM | POA: Diagnosis not present

## 2018-04-24 MED ORDER — BETAMETHASONE DIPROPIONATE 0.05 % EX OINT: TOPICAL_OINTMENT | Freq: Two times a day (BID) | CUTANEOUS | 0 refills | Status: DC

## 2018-04-24 MED ORDER — DULOXETINE HCL 30 MG PO CPEP: 30.0000 mg | ORAL_CAPSULE | Freq: Every day | ORAL | 0 refills | Status: DC

## 2018-04-24 MED ORDER — ESZOPICLONE 1 MG PO TABS: ORAL_TABLET | ORAL | 0 refills | Status: DC

## 2018-04-24 NOTE — Patient Instructions (Addendum)
Goals    . Blood Pressure < 130/80    . DIET - INCREASE WATER INTAKE     65+ fluid ounces daily     . Exercise 150 min/wk Moderate Activity    . Quit Smoking       Recommend looking into Cognitive Behavioral Therapy (CBT) - you will need to reach out via insurance and coordinate with approved provider  Try lunestra (eszopiclone) 1-2 mg at bedtime; when you run out let me know and can try 3 mg dose if needed  If Still having lots of problems, recommend referral to sleep specialist  Speak with dentist about TMJ -   Get a blood pressure cuff and start checking regularly   Try FODMAP elimination diet - keep a food log - eliminate 2 weeks at a time     Cognitive Behavioral Therapy Cognitive behavioral therapy (CBT) is a short-term, goal-oriented type of talk therapy. CBT can help you:  Identify patterns of thinking, feeling, and behaving that are causing you problems.  Decide how you want to think, feel, and respond to life events.  Set goals to change the beliefs and thoughts that cause you to act in ways that are not helpful for you.  Follow up on the changes that you make. What are the different types of CBT? The different types of CBT include:  Dialectical behavioral therapy (DBT). This approach is often used in group therapy, and it aids a person in managing behavior by focusing on: ? Things that cause problems to start (triggers). ? Methods of self-calming. ? Re-evaluating thinking processes.  Mindfulness-based cognitive therapy. This approach involves focusing your attention, meditating, and developing awareness of the present moment (mindfulness).  Rational emotive behavior therapy. This approach uses rational thought to reframe your thinking so it is less judgmental. Your therapist may directly challenge your thought processes.  Stress inoculation training. This approach involves planning ahead for stressful situations by practicing new thoughts and behaviors. This  planning can help you avoid going back to old actions.  Acceptance and commitment therapy (ACT). This approach focuses on accepting yourself as you are and practicing mindfulness. It helps you understand what you would like to change and how you can set goals in that direction. What conditions is CBT used to treat? CBT may help to treat:  Mental health conditions, including: ? Depression. ? Anxiety. ? Bipolar disorder. ? Eating disorders. ? Post-traumatic stress disorder (PTSD). ? Obsessive-compulsive disorder (OCD).  Insomnia and other sleep disorders.  Pain.  Stress.  Coping with loss or grief.  Coping with a difficult medical diagnosis or illness.  Relationship problems.  Emotional distress or shock (trauma). How can CBT help me? CBT may:  Give you a chance to share your thoughts, feelings, problems, and fears in a safe space.  Help you focus on specific problems.  Give you homework that helps you put theory into practice. Homework may include keeping a journal or doing thinking exercises.  Help you become aware of your patterns of thinking, feeling, and behaving, and how those three patterns affect each other.  Change your thoughts so that you can change your behaviors.  Help you chose how you want to view the world.  Teach you planned coping skills and offer better ways to deal with stress and difficult situations. To make the most of CBT, make sure you:  Find a licensed therapist whom you trust.  Take an active part in your therapy and do the homework that you are  given.  Are honest about your problems.  Avoid skipping your therapy sessions. Summary  Cognitive behavioral therapy (CBT) is a short-term, goal-oriented type of talk therapy.  CBT can help you become aware of your patterns and the relationships among your thoughts, feelings, and behavior.  CBT may help mental health conditions and other problems. This information is not intended to replace  advice given to you by your health care provider. Make sure you discuss any questions you have with your health care provider. Document Released: 05/15/2016 Document Revised: 05/15/2016 Document Reviewed: 05/15/2016 Elsevier Interactive Patient Education  2019 [employer]     Consider keeping a food diary- common causes of diarrhea are dairy, certain carbs...  FODMAP stands for fermentable oligo-, di-, mono-saccharides and polyols (1). These are the scientific terms used to classify groups of carbs that are notorious for triggering digestive symptoms like bloating, gas and stomach pain.   FODMAPs are found in a wide range of foods in varying amounts. Some foods contain just one type, while others contain several.  The main dietary sources of the four groups of FODMAPs include:  Oligosaccharides: Wheat, rye, legumes and various fruits and vegetables, such as garlic and onions.  Disaccharides: Milk, yogurt and soft cheese. Lactose is the main carb.  Monosaccharides: Various fruit including figs and mangoes, and sweeteners such as honey and agave nectar. Fructose is the main carb.  Polyols: Certain fruits and vegetables including blackberries and lychee, as well as some low-calorie sweeteners like those in sugar-free gum.   Keep a food diary. This will help you identify foods that cause symptoms. Write down: ? What you eat and when. ? What symptoms you have. ? When symptoms occur in relation to your meals.  Avoid foods that cause symptoms. Talk with your dietitian about other ways to get the same nutrients that are in these foods.  Eat your meals slowly, in a relaxed setting.  Aim to eat 5-6 small meals per day. Do not skip meals.  Drink enough fluids to keep your urine clear or pale yellow.  Ask your health care provider if you should take an over-the-counter probiotic during flare-ups to help restore healthy gut bacteria.  If you have cramping or diarrhea, try making your  meals low in fat and high in carbohydrates. Examples of carbohydrates are pasta, rice, whole grain breads and cereals, fruits, and vegetables.  If dairy products cause your symptoms to flare up, try eating less of them. You might be able to handle yogurt better than other dairy products because it contains bacteria that help with digestion.           Eszopiclone tablets What is this medicine? ESZOPICLONE (es ZOE pi clone) is used to treat insomnia. This medicine helps you to fall asleep and sleep through the night. This medicine may be used for other purposes; ask your health care provider or pharmacist if you have questions. COMMON BRAND NAME(S): Lunesta What should I tell my health care provider before I take this medicine? They need to know if you have any of these conditions: -depression -history of a drug or alcohol abuse problem -liver disease -lung or breathing disease -sleep-walking, driving, eating or other activity while not fully awake after taking a sleep medicine -suicidal thoughts -an unusual or allergic reaction to eszopiclone, other medicines, foods, dyes, or preservatives -pregnant or trying to get pregnant -breast-feeding How should I use this medicine? Take this medicine by mouth with a glass of water. Follow the directions on  the prescription label. It is better to take this medicine on an empty stomach and only when you are ready for bed. Do not take your medicine more often than directed. If you have been taking this medicine for several weeks and suddenly stop taking it, you may get unpleasant withdrawal symptoms. Your doctor or health care professional may want to gradually reduce the dose. Do not stop taking this medicine on your own. Always follow your doctor or health care professional's advice. Talk to your pediatrician regarding the use of this medicine in children. Special care may be needed. Overdosage: If you think you have taken too much of this  medicine contact a poison control center or emergency room at once. NOTE: This medicine is only for you. Do not share this medicine with others. What if I miss a dose? This does not apply. This medicine should only be taken immediately before going to sleep. Do not take double or extra doses. What may interact with this medicine? -herbal medicines like kava kava, melatonin, St. John's wort and valerian -lorazepam -medicines for fungal infections like ketoconazole, fluconazole, or itraconazole -olanzapine This list may not describe all possible interactions. Give your health care provider a list of all the medicines, herbs, non-prescription drugs, or dietary supplements you use. Also tell them if you smoke, drink alcohol, or use illegal drugs. Some items may interact with your medicine. What should I watch for while using this medicine? Visit your doctor or health care professional for regular checks on your progress. Keep a regular sleep schedule by going to bed at about the same time nightly. Avoid caffeine-containing drinks in the evening hours, as caffeine can cause trouble with falling asleep. Talk to your doctor if you still have trouble sleeping. After taking this medicine, you may get up out of bed and do an activity that you do not know you are doing. The next morning, you may have no memory of this. Activities include driving a car ("sleep-driving"), making and eating food, talking on the phone, sexual activity, and sleep-walking. Serious injuries have occurred. Stop the medicine and call your doctor right away if you find out you have done any of these activities. Do not take this medicine if you have used alcohol that evening. Do not take it if you have taken another medicine for sleep. The risk of doing these sleep-related activities is higher. Do not take this medicine unless you are able to stay in bed for a full night (7 to 8 hours) before you must be active again. You may have a decrease  in mental alertness the day after use, even if you feel that you are fully awake. Tell your doctor if you will need to perform activities requiring full alertness, such as driving, the next day. Do not stand or sit up quickly after taking this medicine, especially if you are an older patient. This reduces the risk of dizzy or fainting spells. If you or your family notice any changes in your behavior, such as new or worsening depression, thoughts of harming yourself, anxiety, other unusual or disturbing thoughts, or memory loss, call your doctor right away. After you stop taking this medicine, you may have trouble falling asleep. This is called rebound insomnia. This problem usually goes away on its own after 1 or 2 nights. What side effects may I notice from receiving this medicine? Side effects that you should report to your doctor or health care professional as soon as possible: -allergic reactions like skin  rash, itching or hives, swelling of the face, lips, or tongue -changes in vision -confusion -depressed mood -feeling faint or lightheaded, falls -hallucinations -problems with balance, speaking, walking -restlessness, excitability, or feelings of agitation -unusual activities while not fully awake like driving, eating, making phone calls Side effects that usually do not require medical attention (report to your doctor or health care professional if they continue or are bothersome): -dizziness, or daytime drowsiness, sometimes called a hangover effect -headache This list may not describe all possible side effects. Call your doctor for medical advice about side effects. You may report side effects to FDA at 1-800-FDA-1088. Where should I keep my medicine? Keep out of the reach of children. This medicine can be abused. Keep your medicine in a safe place to protect it from theft. Do not share this medicine with anyone. Selling or giving away this medicine is dangerous and against the law. This  medicine may cause accidental overdose and death if taken by other adults, children, or pets. Mix any unused medicine with a substance like cat litter or coffee grounds. Then throw the medicine away in a sealed container like a sealed bag or a coffee can with a lid. Do not use the medicine after the expiration date. Store at room temperature between 15 and 30 degrees C (59 and 86 degrees F). NOTE: This sheet is a summary. It may not cover all possible information. If you have questions about this medicine, talk to your doctor, pharmacist, or health care provider.  2019 Elsevier/Gold Standard (2017-06-28 11:57:05)

## 2018-04-25 ENCOUNTER — Other Ambulatory Visit: Payer: Self-pay | Admitting: Adult Health

## 2018-04-25 LAB — COMPLETE METABOLIC PANEL WITH GFR
AG Ratio: 1.7 (calc) (ref 1.0–2.5)
ALT: 15 U/L (ref 6–29)
AST: 17 U/L (ref 10–35)
Albumin: 4.4 g/dL (ref 3.6–5.1)
Alkaline phosphatase (APISO): 199 U/L — ABNORMAL HIGH (ref 37–153)
BUN: 12 mg/dL (ref 7–25)
CO2: 28 mmol/L (ref 20–32)
Calcium: 9.6 mg/dL (ref 8.6–10.4)
Chloride: 105 mmol/L (ref 98–110)
Creat: 0.68 mg/dL (ref 0.50–1.05)
GFR, Est African American: 117 mL/min/{1.73_m2} (ref >=60)
GFR, Est Non African American: 101 mL/min/{1.73_m2} (ref >=60)
Globulin: 2.6 g/dL (calc) (ref 1.9–3.7)
Glucose, Bld: 104 mg/dL — ABNORMAL HIGH (ref 65–99)
Potassium: 3.9 mmol/L (ref 3.5–5.3)
Sodium: 140 mmol/L (ref 135–146)
Total Bilirubin: 0.3 mg/dL (ref 0.2–1.2)
Total Protein: 7 g/dL (ref 6.1–8.1)

## 2018-04-25 LAB — CBC WITH DIFFERENTIAL/PLATELET
Absolute Monocytes: 655 cells/uL (ref 200–950)
Basophils Absolute: 19 cells/uL (ref 0–200)
Basophils Relative: 0.3 %
Eosinophils Absolute: 88 cells/uL (ref 15–500)
Eosinophils Relative: 1.4 %
HCT: 36 % (ref 35.0–45.0)
Hemoglobin: 11.7 g/dL (ref 11.7–15.5)
Lymphs Abs: 1833 cells/uL (ref 850–3900)
MCH: 25.5 pg — ABNORMAL LOW (ref 27.0–33.0)
MCHC: 32.5 g/dL (ref 32.0–36.0)
MCV: 78.4 fL — ABNORMAL LOW (ref 80.0–100.0)
MPV: 10.1 fL (ref 7.5–12.5)
Monocytes Relative: 10.4 %
Neutro Abs: 3704 cells/uL (ref 1500–7800)
Neutrophils Relative %: 58.8 %
Platelets: 332 10*3/uL (ref 140–400)
RBC: 4.59 10*6/uL (ref 3.80–5.10)
RDW: 15.8 % — ABNORMAL HIGH (ref 11.0–15.0)
Total Lymphocyte: 29.1 %
WBC: 6.3 10*3/uL (ref 3.8–10.8)

## 2018-04-25 LAB — HEMOGLOBIN A1C
Hgb A1c MFr Bld: 6.2 % of total Hgb — ABNORMAL HIGH (ref <5.7)
Mean Plasma Glucose: 131 (calc)
eAG (mmol/L): 7.3 (calc)

## 2018-04-25 LAB — VITAMIN D 25 HYDROXY (VIT D DEFICIENCY, FRACTURES): Vit D, 25-Hydroxy: 38 ng/mL (ref 30–100)

## 2018-04-25 LAB — MAGNESIUM: Magnesium: 2 mg/dL (ref 1.5–2.5)

## 2018-04-25 MED ORDER — VITAMIN D (ERGOCALCIFEROL) 1.25 MG (50000 UNIT) PO CAPS: 50000.0000 [IU] | ORAL_CAPSULE | ORAL | 3 refills | Status: DC

## 2018-04-30 ENCOUNTER — Other Ambulatory Visit: Payer: Self-pay | Admitting: Adult Health

## 2018-04-30 DIAGNOSIS — Z8669 Personal history of other diseases of the nervous system and sense organs: Secondary | ICD-10-CM

## 2018-04-30 DIAGNOSIS — G4700 Insomnia, unspecified: Secondary | ICD-10-CM

## 2018-04-30 MED ORDER — QUETIAPINE FUMARATE 25 MG PO TABS: ORAL_TABLET | ORAL | 0 refills | Status: DC

## 2018-05-07 ENCOUNTER — Telehealth: Payer: Self-pay | Admitting: Neurology

## 2018-05-07 ENCOUNTER — Encounter: Payer: Self-pay | Admitting: Neurology

## 2018-05-07 NOTE — Telephone Encounter (Signed)
Called to review the patient's chart. No answer. LVM informing the patient I will send a mychart for her to reply to or call me back.

## 2018-05-07 NOTE — Telephone Encounter (Signed)
Due to current COVID 19 pandemic, our office is severely reducing in office visits, in order to minimize the risk to our patients and healthcare providers.  Pt understands that although there may be some limitations with this type of visit, we will take all precautions to reduce any security or privacy concerns.  Pt understands that this will be treated like an in office visit and we will file with pt's insurance, and there may be a patient responsible charge related to this service. Pt's email is [email or web address]. Pt understands that the cis[employer] webex software must be downloaded and operational on the device pt plans to use for the visit. Pt understands that the nurse will be calling to go over pt's chart.

## 2018-05-10 ENCOUNTER — Other Ambulatory Visit: Payer: Self-pay | Admitting: Physician Assistant

## 2018-05-11 ENCOUNTER — Other Ambulatory Visit: Payer: Self-pay | Admitting: Adult Health

## 2018-05-12 ENCOUNTER — Encounter: Payer: Self-pay | Admitting: Neurology

## 2018-05-13 ENCOUNTER — Ambulatory Visit (INDEPENDENT_AMBULATORY_CARE_PROVIDER_SITE_OTHER): Payer: BLUE CROSS/BLUE SHIELD | Admitting: Neurology

## 2018-05-13 ENCOUNTER — Telehealth: Payer: Self-pay | Admitting: Neurology

## 2018-05-13 ENCOUNTER — Other Ambulatory Visit: Payer: Self-pay

## 2018-05-13 DIAGNOSIS — G8929 Other chronic pain: Secondary | ICD-10-CM

## 2018-05-13 DIAGNOSIS — F19982 Other psychoactive substance use, unspecified with psychoactive substance-induced sleep disorder: Secondary | ICD-10-CM

## 2018-05-13 DIAGNOSIS — M797 Fibromyalgia: Secondary | ICD-10-CM

## 2018-05-13 DIAGNOSIS — R52 Pain, unspecified: Secondary | ICD-10-CM

## 2018-05-13 DIAGNOSIS — M25511 Pain in right shoulder: Secondary | ICD-10-CM | POA: Diagnosis not present

## 2018-05-13 DIAGNOSIS — F411 Generalized anxiety disorder: Secondary | ICD-10-CM

## 2018-05-13 DIAGNOSIS — M25552 Pain in left hip: Secondary | ICD-10-CM

## 2018-05-13 DIAGNOSIS — M25551 Pain in right hip: Secondary | ICD-10-CM

## 2018-05-13 DIAGNOSIS — M25512 Pain in left shoulder: Secondary | ICD-10-CM

## 2018-05-13 DIAGNOSIS — F5104 Psychophysiologic insomnia: Secondary | ICD-10-CM

## 2018-05-13 DIAGNOSIS — G4701 Insomnia due to medical condition: Secondary | ICD-10-CM

## 2018-05-13 MED ORDER — SUVOREXANT 10 MG PO TABS: 10.0000 mg | ORAL_TABLET | Freq: Every evening | ORAL | 0 refills | Status: DC | PRN

## 2018-05-13 NOTE — Progress Notes (Signed)
Virtual Visit via Video Note  I connected with Wendy Valencia on 05/13/18 at  2:30 PM EDT by a video enabled telemedicine application and verified that I am speaking with the correct person using two identifiers.   I discussed the limitations of evaluation and management by telemedicine and the availability of in person appointments. The patient expressed understanding and agreed to proceed.   SLEEP MEDICINE CLINIC   Provider:  Melvyn Novas, MD   Primary Care Physician:  Quentin Mulling, PA-C   Referring Provider: Quentin Mulling, PA-C   Chronic INSOMNIA -for 15 years.   Started 16 years ago at the time of  the death of her mother. Chief complaint according to patient :" I was taken off the medication that helped me to sleep" .   HPI:  Wendy Valencia is a 53 y.o. female , seen here as in a referral from PA McGregor .  Wendy Valencia has been a patient of Dr. Lucky Cowboy and presented to PCP on 24 April 2018.  She had been chronically dependent on benzodiazepines and stated that those were helping her to sleep and overcome the underlying chronic pain related to fibromyalgia.  Her pain specialist then changed her pain medication to tramadol and because there is a possible interaction between benzodiazepines and opiates she was taken off the medications.  She stated that she slept well on lorazepam she slept good on alprazolam for the last 13 years until a conflict with tramadol became the main concern of her treating physician and since then she has been pushed into chronic insomnia.   Sleep and medical history: had a sleep study in [place], [place] about 10 years ago.  Carries a diagnosis of morbid obesity with comorbidities, prediabetes, hypercholesterolemia, hypertension, tobacco abuse, vitamin D deficiency, she most recently wished to start Cymbalta for the treatment of fibromyalgia.  She also has a history of irritable bowel syndrome, gastroesophageal reflux syndrome,  arthritis, anemia, allergies, kidney stones, plantar fasciitis.The patient has very long standing Insomnia and carries a diagnosis of Fibromyalgia. Pain treatment is with Dr Colan Neptune. He treats for hip bursitis.   Interestingly the patient was prescribed phentermine for weight loss ( which is insomnia causing)  and was taken off in mid March 2020 after she lost 0 weight since the last visit to  primary care office.  She had confessed to stress eating grazing during the day.   Body mass index is a 38.  Hemoglobin A1c was 5.9 I do not think she is prediabetic anymore.    Social history: married for 6 years, one son- adult. Tobacco use, overeating disorder, 1 glass of wine /week-  ETOH. Limited caffeine.  The patient's mother died of breast cancer at 36, when the patient was young. Father alive with CAD.   Sleep habits are as follows: The patient's dinnertime is between 6 and 7:30 PM and she usually walks before or just after dinner.  Her bedtime is 10 PM, her sleep time is 11 PM this is after she takes medication which includes a high dose of Valencia she is always waking up around 2 AM after she slept on her side and in supine position on one pillow at 2 AM she usually has a bathroom break.  This is the only one through the night.  For the last 2 to 3 weeks she has tried another Valencia at 25 mg and she states she cannot fall asleep again until she takes it.  If she is  not taking Valencia she is awake until 4:30 AM and she rises at 5:30 AM.  Wendy Valencia she cannot sleep and sometimes even extend her bedtime to 7 AM.  She states her total sleep time with medication and only then amounts to 4 hours.  She data feels refreshed no restored.  She does not suffer from excessive daytime sleepiness and she does not take naps even if she would try to nap she could not fall asleep.    Review of Systems: Out of a complete 14 system review, the patient complains of only the following symptoms, and all other  reviewed systems are negative. How likely are you to doze in the following situations: 0 = not likely, 1 = slight chance, 2 = moderate chance, 3 = high chance  Sitting and Reading? Watching Television? Sitting inactive in a public place (theater or meeting)? Lying down in the afternoon when circumstances permit? Sitting and talking to someone? Sitting quietly after lunch without alcohol? In a car, while stopped for a few minutes in traffic? As a passenger in a car for an hour without a break?  Total = 0  Chronic insomnia. Over a decade.  Fibromyalgia/ hip bursitis seen by pain management  She was used to take Lorazepam for years.   Feels cognitively impaired , slowed- medication side effect? She feels strongly that insomnia is the cause, but the cause of insomnia has not been evaluated.   Chief complaint according to patient :" I was taken off the medication that helped me to sleep" . Chronic insomnia she has failed multiple agents in the past but somehow was not covered by insurance, she has depression, anxiety for the same reason trazodone and doxepin were attempted doxepin worked for 2 months and then stopped working her primary care physician stated that Ambien would not be a drug of choice for her.   Social History   Socioeconomic History  . Marital status: Single    Spouse name: Not on file  . Number of children: 1  . Years of education: Not on file  . Highest education level: Not on file  Occupational History    Comment: Account manager for [employer]  Social Needs  . Financial resource strain: Not on file  . Food insecurity:    Worry: Not on file    Inability: Not on file  . Transportation needs:    Medical: Not on file    Non-medical: Not on file  Tobacco Use  . Smoking status: Current Every Day Smoker    Packs/day: 0.25    Years: 7.00    Pack years: 1.75    Types: Cigarettes  . Smokeless tobacco: Never Used  Substance and Sexual Activity  . Alcohol use: Yes     Alcohol/week: 1.0 standard drinks    Types: 1 Glasses of wine per week    Comment: 1x week  . Drug use: No  . Sexual activity: Yes    Partners: Male    Birth control/protection: Injection    Comment: 1st intercourse- 18, partners-?, married- 5 yrs   Lifestyle  . Physical activity:    Days per week: Not on file    Minutes per session: Not on file  . Stress: Not on file  Relationships  . Social connections:    Talks on phone: Not on file    Gets together: Not on file    Attends religious service: Not on file    Active member of club or organization: Not on  file    Attends meetings of clubs or organizations: Not on file    Relationship status: Not on file  . Intimate partner violence:    Fear of current or ex partner: Not on file    Emotionally abused: Not on file    Physically abused: Not on file    Forced sexual activity: Not on file  Other Topics Concern  . Not on file  Social History Narrative  . Not on file    Family History  Problem Relation Age of Onset  . Heart attack Maternal Grandfather   . Heart disease Maternal Grandfather   . Heart attack Paternal Grandfather   . Breast cancer Mother [id number]       breast  . Cancer Mother [id number]       breast/ ovarian  . Obesity Brother   . Breast cancer Maternal Aunt [id number]       died at age 52  . Lung cancer Maternal Grandmother   . Cancer Maternal Grandmother        ovarian  . Heart disease Father   . Hypertension Father   . Stroke Father   . Multiple sclerosis Maternal Uncle   . Colon cancer Neg Hx   . Colon polyps Neg Hx   . Esophageal cancer Neg Hx   . Rectal cancer Neg Hx     Past Medical History:  Diagnosis Date  . Allergy   . Anemia   . Anxiety   . Arthritis   . Fibromyalgia   . GERD (gastroesophageal reflux disease)   . Hypertension   . IBS (irritable bowel syndrome)   . Nephrolithiasis   . Plantar fasciitis of right foot    wears boot     Past Surgical History:  Procedure Laterality Date  . BREATH TEK  H PYLORI  07/11/2011   Procedure: BREATH TEK H PYLORI;  Surgeon: Mariella SaaBenjamin T Hoxworth, MD;  [facility];  Service: General;  Laterality: N/A;  . CHOLECYSTECTOMY  10/01/2011   Procedure: LAPAROSCOPIC CHOLECYSTECTOMY WITH INTRAOPERATIVE CHOLANGIOGRAM;  Surgeon: Mariella SaaBenjamin T Hoxworth, MD;  Location: [facility];  Service: General;  Laterality: N/A;  . COLONOSCOPY    . GASTRIC ROUX-EN-Y  10/01/2011   Procedure: LAPAROSCOPIC ROUX-EN-Y GASTRIC;  Surgeon: Mariella SaaBenjamin T Hoxworth, MD;  Location: [facility];  Service: General;  Laterality: N/A;  . Left shoulder arthroscopic surgery  yrs ago  . tummy tuck  2005    Current Outpatient Medications  Medication Sig Dispense Refill  . Albuterol Sulfate (PROVENTIL HFA IN) Inhale 108 mcg into the lungs as needed.    . Aspirin-Acetaminophen-Caffeine (EXCEDRIN EXTRA STRENGTH PO) Take 1 tablet by mouth 3 (three) times daily as needed (headache).     . BELSOMRA 10 MG TABS     . betamethasone dipropionate (DIPROLENE) 0.05 % ointment Apply topically 2 (two) times daily. 30 g 0  . cetirizine-pseudoephedrine (ZYRTEC-D) 5-120 MG tablet Take 1 tablet by mouth every 12 (twelve) hours as needed for allergies.     . diphenhydrAMINE (BENADRYL) 25 MG tablet Take 25-50 mg by mouth 3 (three) times daily as needed for allergies.    . DULoxetine (CYMBALTA) 30 MG capsule Take 1 capsule (30 mg total) by mouth daily. Take with 60 mg cap for total of 90 mg daily. 30 capsule 0  . DULoxetine (CYMBALTA) 60 MG capsule TAKE ONE CAPSULE BY MOUTH DAILY 30 capsule 0  . ferrous sulfate 325 (65 FE) MG EC tablet Take 325 mg by mouth at bedtime.     .Marland Kitchen  gabapentin (NEURONTIN) 300 MG capsule TAKE 1-2 CAPSULES UP TO THREE TIMES A DAY AS NEEDED FOR PAIN 180 capsule 1  . Glucosamine-Chondroitin (COSAMIN DS PO) Take 1,500 mg by mouth daily.    Marland Kitchen lisinopril-hydrochlorothiazide (PRINZIDE,ZESTORETIC) 20-12.5 MG tablet TAKE 1 TABLET BY MOUTH EVERY DAY 90 tablet 1  . Magnesium 500 MG TABS Take 500 mg by mouth 2  (two) times daily.     . Multiple Vitamins-Minerals (MULTIVITAMIN WITH MINERALS) tablet Take 1 tablet by mouth daily. .    . naproxen (NAPROSYN) 500 MG tablet Take 1 tablet (500 mg total) by mouth 2 (two) times daily. 30 tablet 0  . phentermine (ADIPEX-P) 37.5 MG tablet TAKE ONE TABLET BY MOUTH DAILY 30 tablet 0  . QUEtiapine (Valencia) 25 MG tablet TAKE ONE TABLET BY MOUTH EVERY NIGHT AT BEDTIME FOR SLEEP. INCREASE TO TWO TABLETS AS NEEDED AFTER TWO DAYS AS DIRECTED 30 tablet 0  . Spacer/Aero-Holding Chambers (AEROCHAMBER PLUS WITH MASK) inhaler Use as instructed 1 each 2  . traMADol (ULTRAM) 50 MG tablet Take 1 tablet (50 mg total) by mouth every 6 (six) hours as needed for severe pain. 120 tablet 5  . TURMERIC PO Take 1,000 mg by mouth daily.    . valACYclovir (VALTREX) 500 MG tablet Take 500 mg by mouth as needed.    . Vitamin D, Ergocalciferol, (DRISDOL) 1.25 MG (50000 UT) CAPS capsule TAKE ONE CAPSULE BY MOUTH TWICE WEEKLY 8 capsule 3   No current facility-administered medications for this visit.     Allergies as of 05/13/2018  . (No Known Allergies)    Vitals: There were no vitals taken for this visit. Last Weight:  Wt Readings from Last 1 Encounters:  04/24/18 247 lb 12.8 oz (112.4 kg)   ZOX:WRUEA is no height or weight on file to calculate BMI.     Last Height:   Ht Readings from Last 1 Encounters:  04/24/18  (1.727 m)    Physical exam:  General: The patient is awake, alert and appears not in acute distress. The patient is well groomed. Head: Normocephalic, atraumatic. Neck is supple.  Mallampati 2,  neck circumference:17'  Nasal airflow patent -Retrognathia is mild.   Respiratory: no SOB. Skin:  Without evidence of facial or hand edema, or rash Trunk: BMI is over 40.  The patient's posture is erect.  Neurologic exam : The patient is awake and alert, oriented to place and time.   Memory subjective described as impaired, slowed.    Attention span &  concentration ability appears normal.  Speech is fluent, without  dysarthria, dysphonia or aphasia.  Mood and affect are appropriate.  Cranial nerves: Pupils are equal Facial motor strength is symmetric and tongue and uvula move midline. Shoulder shrug was symmetrical.   Motor exam: Symmetric ROM in upper extremities. She reports stiffness, pain in AM.   Movements in the fingers/hands was  normal without evidence of ataxia, dysmetria or tremor.  Gait and station: Patient walks without assistive device .  Assessment and Plan: Wendy Valencia presents with chronic insomnia most likely of nonorganic origin.  Even if she would snore and would have apneic events the degree of insomnia in the absence of excessive daytime sleepiness and daytime is almost never seen with organic sleep disorders.  I would very strongly suspect that the patient is highly anxious, depressed, and that her mood disorder and lifestyle still affect her ability to sleep.  Cognitive behavioral therapy is the best therapy for chronic insomnia.  I explained to her that I will be in favor of limiting my role to finding out if she has sleep disordered breathing or not if we find sleep disordered breathing I will be happy to treat her, if she does not have an organic sleep disorder she will be returning to her primary care physician.   Follow Up Instructions: ordered attended PSG to qualify sleep perception and arousal origin.     I discussed the assessment and treatment plan with the patient. The patient was provided an opportunity to ask questions and all were answered. The patient agreed with the plan and demonstrated an understanding of the instructions.   The patient was advised to call back or seek an in-person evaluation if the symptoms worsen or if the condition fails to improve as anticipated.  I provided 30 minutes of non-face-to-face time during this encounter.   Melvyn Novas, MD    No orders of the defined  types were placed in this encounter.   Full PSG, with parasomnia montage.  I will provide a sample of BELSOMRA.   I agree with referral to cognitive behavior therapy  Melvyn Novas, MD 05/13/2018, 2:37 PM  Certified in Neurology by ABPN Certified in Sleep Medicine by [facility] [address], [address] [place], [place] [address]            History of Present Illness:    Observations/Objective:

## 2018-05-13 NOTE — Telephone Encounter (Signed)
Informed the patient that we had samples of the belsomra that Dr Dohmeier mentioned. Advised the patient I could have it ready for her. She will pick up at 4:30 pm

## 2018-05-14 ENCOUNTER — Other Ambulatory Visit: Payer: Self-pay | Admitting: Adult Health

## 2018-05-14 ENCOUNTER — Encounter: Payer: Self-pay | Admitting: Neurology

## 2018-05-14 DIAGNOSIS — M797 Fibromyalgia: Secondary | ICD-10-CM

## 2018-05-14 MED ORDER — DULOXETINE HCL 30 MG PO CPEP: 30.0000 mg | ORAL_CAPSULE | Freq: Every day | ORAL | 1 refills | Status: DC

## 2018-05-14 MED ORDER — DULOXETINE HCL 60 MG PO CPEP: 60.0000 mg | ORAL_CAPSULE | Freq: Every day | ORAL | 1 refills | Status: DC

## 2018-05-16 ENCOUNTER — Encounter: Payer: Self-pay | Admitting: Neurology

## 2018-05-25 ENCOUNTER — Other Ambulatory Visit: Payer: Self-pay | Admitting: Physician Assistant

## 2018-05-27 ENCOUNTER — Encounter: Payer: Self-pay | Admitting: Neurology

## 2018-05-27 MED ORDER — QUETIAPINE FUMARATE 25 MG PO TABS: ORAL_TABLET | ORAL | 3 refills | Status: DC

## 2018-05-29 ENCOUNTER — Encounter: Payer: Self-pay | Admitting: Neurology

## 2018-05-29 ENCOUNTER — Other Ambulatory Visit: Payer: Self-pay | Admitting: Neurology

## 2018-05-29 MED ORDER — QUETIAPINE FUMARATE 25 MG PO TABS: 75.0000 mg | ORAL_TABLET | Freq: Every day | ORAL | 0 refills | Status: DC

## 2018-06-11 ENCOUNTER — Other Ambulatory Visit: Payer: Self-pay | Admitting: Physician Assistant

## 2018-06-11 DIAGNOSIS — M797 Fibromyalgia: Secondary | ICD-10-CM

## 2018-06-13 ENCOUNTER — Ambulatory Visit (INDEPENDENT_AMBULATORY_CARE_PROVIDER_SITE_OTHER): Payer: BLUE CROSS/BLUE SHIELD | Admitting: Neurology

## 2018-06-13 DIAGNOSIS — F411 Generalized anxiety disorder: Secondary | ICD-10-CM

## 2018-06-13 DIAGNOSIS — F19982 Other psychoactive substance use, unspecified with psychoactive substance-induced sleep disorder: Secondary | ICD-10-CM

## 2018-06-13 DIAGNOSIS — M25511 Pain in right shoulder: Secondary | ICD-10-CM

## 2018-06-13 DIAGNOSIS — G8929 Other chronic pain: Secondary | ICD-10-CM

## 2018-06-13 DIAGNOSIS — F5104 Psychophysiologic insomnia: Secondary | ICD-10-CM

## 2018-06-13 DIAGNOSIS — G478 Other sleep disorders: Secondary | ICD-10-CM | POA: Diagnosis not present

## 2018-06-13 DIAGNOSIS — G4701 Insomnia due to medical condition: Secondary | ICD-10-CM

## 2018-06-13 DIAGNOSIS — M797 Fibromyalgia: Secondary | ICD-10-CM

## 2018-06-16 ENCOUNTER — Other Ambulatory Visit: Payer: Self-pay

## 2018-06-23 ENCOUNTER — Encounter: Payer: Self-pay | Admitting: Neurology

## 2018-06-23 DIAGNOSIS — G4701 Insomnia due to medical condition: Secondary | ICD-10-CM | POA: Insufficient documentation

## 2018-06-23 NOTE — Procedures (Signed)
PATIENT'S NAME:  Wendy Valencia, Areeba DOB:      06/14/2017      MR#:    [id number]     DATE OF RECORDING: 06/13/2018  Tilden FossaBob Janzef REFERRING M.D.:  Quentin Mullingollier, Amanda PA-C Study Performed:   Baseline Polysomnogram HISTORY:  --- Sleep and medical history: had a sleep study in [place], [place] about 10 years ago.  Carries a diagnosis of morbid obesity with comorbidities, prediabetes, hypercholesterolemia, hypertension, tobacco abuse, vitamin D deficiency, she most recently wished to start Cymbalta for the treatment of fibromyalgia.  She also has a history of irritable bowel syndrome, gastroesophageal reflux syndrome, arthritis, anemia, allergies, kidney stones, and plantar fasciitis. The patient has very long standing Insomnia and carries a diagnosis of Fibromyalgia. Pain treatment is with Dr. Colan NeptuneNavaida. He treats for hip bursitis. Interestingly, the patient was prescribed phentermine for weight loss (which can cause insomnia) and was taken off in mid -March 2020 after she lost zero weight since the last visit to primary care office. She had confessed to stress eating grazing during the day.  Body mass index is a 38 kg/m2. Hemoglobin A1c was 5.9, she is pre-diabetic.  She states her total sleep time with medication and only then amounts to 4 hours.  She does not suffer from excessive daytime sleepiness and she does not take naps even if she would try to nap she could not fall asleep.  The patient endorsed the Epworth Sleepiness Scale at 0 points. The patient's weight 247 pounds with a height of 68 (inches), resulting in a BMI of 37.4 kg/m2.The patient's neck circumference measured 17 inches.  CURRENT MEDICATIONS: none listed by tech   PROCEDURE:  This is a multichannel digital polysomnogram utilizing the Somnostar 11.2 system.  Electrodes and sensors were applied and monitored per AASM Specifications.   EEG, EOG, Chin and Limb EMG, were sampled at 200 Hz.  ECG, Snore and Nasal Pressure, Thermal Airflow, Respiratory  Effort, CPAP Flow and Pressure, Oximetry was sampled at 50 Hz. Digital video and audio were recorded.      BASELINE STUDY: Lights Out was at 22:41 and Lights On at 04:59.  Total recording time (TRT) was 379 minutes, with a total sleep time (TST) of 352.5 minutes.   The patient's sleep latency was 15.5 minutes.  REM latency was 98.5 minutes.  The sleep efficiency was 93.0%.     SLEEP ARCHITECTURE: WASO (Wake after sleep onset) was 23.5 minutes.  There were 6.5 minutes in Stage N1, 276 minutes Stage N2, 43.5 minutes Stage N3 and 26.5 minutes in Stage REM.  The percentage of Stage N1 was 1.8%, Stage N2 was 78.3%, Stage N3 was 12.3% and Stage R (REM sleep) was 7.5%.   RESPIRATORY ANALYSIS:  There were a total of 17 respiratory events:  0 obstructive apneas, 0 central apneas and 0 mixed apneas with a total of 0 apneas and an apnea index (AI) of 0 /hour. There were 17 hypopneas with a hypopnea index of 2.9 /hour. The patient also had 0 respiratory event related arousals (RERAs).     The total APNEA/HYPOPNEA INDEX (AHI) was 2.9 /hour and the total RESPIRATORY DISTURBANCE INDEX was 0. 2.9 /hour.  0 events occurred in REM sleep and 34 events in NREM. The REM AHI was 0. 0 /hour, versus a non-REM AHI of 3.1. The patient spent 278 minutes of total sleep time in the supine position and 75 minutes in non-supine. The supine AHI was 2.2 versus a non-supine AHI of 5.6.  OXYGEN SATURATION &  C02:  The Wake baseline 02 saturation was 96%, with the lowest being 81%. Time spent below 89% saturation equaled 12 minutes. The arousals were noted as: 61 were spontaneous, 0 were associated with PLMs, and 11 were associated with respiratory events. The patient had a total of 0 Periodic Limb Movements.  The Periodic Limb Movement (PLM) index was 0 and the PLM Arousal index was 0/hour.  Audio and video analysis did not show any abnormal or unusual movements, behaviors, phonations or vocalizations. There were several very brief REM  sleep phases before 3.45 AM, and at that time REM sleep reached almost 28 minutes.  Sleep was not very fragmented and overall sleep time was excellent.  The patient didn't have bathroom breaks. She slept for 4 hours supine and only slept on her side for the very last hours. Sleep efficiency was 93%! Mild Snoring was noted. EKG was in keeping with normal sinus rhythm (NSR). Post-study, the patient indicated that sleep was poor, restless and interrupted.   IMPRESSION:  1. No evidence of Obstructive Sleep Apnea (OSA) - No PLMs, and no Insomnia- total sleep time was close to 6 hours.   2. Normal EKG.  RECOMMENDATIONS:  1. No need for CPAP titration or other interventional therapy.  There was no organic sleep disorder identified.   I certify that I have reviewed the entire raw data recording prior to the issuance of this report in accordance with the Standards of Accreditation of the American Academy of Sleep Medicine (AASM)    Larey Seat, MD    06-23-2018 Diplomat, American Board of Psychiatry and Neurology  Diplomat, [place] [employer] [place] Director, [facility] [place], [place]

## 2018-06-24 ENCOUNTER — Telehealth: Payer: Self-pay | Admitting: Neurology

## 2018-06-24 NOTE — Telephone Encounter (Signed)
"  Hey Ms. Borys,  Script was refilled and sent to the pharmacy for you. Dr Dohmeier states for the sleep study you can bring the medication with you. She would like to you attempt to do the study without the medication and then if after a couple hours you are unable to sleep you can take the medication at that time. She wants to initially try the study without though.  Hope this helps.  Thanks"  This was a response on 5/14 on mychart to the patient advising instructions for the sleep study.

## 2018-06-24 NOTE — Telephone Encounter (Signed)
   Please forward to Vicie Mutters and Dr Melford Aase,  My nurse received a My chart message on my behalf:   1)Patient's are asked to bring their sleep aids with them, not to take them before the study begins- (see instructional information 5-14 -2020 on my chart). Our techs will usually be advised when the patient can't sleep within an hour after hook up to give the OK to take the meds). If Mrs. Vanderstelt took the medication right away it is still not changing her apnea count, her oximetry.  2) if a patient is here to rule out organic sleep disorders as cause of insomnia, we look for the physiological factors : such as abnormalities in EKG, EEG, sleep disordered breathing ( AHI), Pulse-Oximetry readings and PLMs by EMG. These were normal.  3)The patient indicated that she felt she had only slept 3 hours during the test night, but sleep was actually much longer and sleep architecture was not consistent with Insomnia. That would be considered a difference/ disorder of sleep perception.  Larey Seat, MD      Quote:  "I don't know what to say but "really" .It seems to me if you all wanted to identify things you pointed out in your study, you would NOT have had me take my meds that help me SLEEP!! WHAT IN THE WORLD AM I MISSING HERE!I have came up with a cocktail of meds to help me SLEEP and yes I was told to take them the night of the STUDY!! I take 75 Mg of the Seroquel, 2 Excedrin PM, 3 Gabepetin , 300Mg  of Melotonin because when I asked for additional help I did not receive it and was told to get in with Cognitive Behavior.The centers I reached out to were not taking new patients until July.I have a very intense job and can't afford not to sleep! Please ensure you send this to the Dr..Marland KitchenI will be taking this up with my insurance to see how this can be escalated because this is not right or fair to me as a patient! ----- Message -----   From: Nurse Payton Spark Sent:  6/8/20205:02 PM EDT To: Kathie Dike Gonce Subject: sleep study results Hey Ms. Harb,  Dr Ezel Vallone reviewed the sleep study and based off the study it did not indicate a organic sleep disorder. No restlessness in extremities and no evidence or obstructive sleep apnea. EKG heart rhythm was in normal and no concern for low oxygenation. Total sleep time was close to 6 hours, therefore no insomnia noted in the study. Did you end up requiring to take your medication to help with sleep? Given there was no organic sleep disturbance found she would recommend treatment for insomnia is best treated with cognitive behavior therapy. Your primary care can place that referral for you if she has not already done so. Please call with any questions you also may have at [phone]. Thanks [employer]

## 2018-06-24 NOTE — Telephone Encounter (Signed)
Thank You. CD

## 2018-06-30 ENCOUNTER — Encounter: Payer: Self-pay | Admitting: Neurology

## 2018-07-03 ENCOUNTER — Telehealth: Payer: Self-pay | Admitting: Neurology

## 2018-07-03 NOTE — Telephone Encounter (Signed)
Glad we could clarify this.

## 2018-07-03 NOTE — Telephone Encounter (Signed)
Thank you so much for this explanation. It really clears up the confusion. if this was explained, I missed it.  In fact, the explanation below makes me feel much better. I had taken the diagnosis first sent as though you all had concluded I did not have chronic insomnia.  So again, Thank you and you all did what you were supposed to do which is rule out the aforementioned factors.   Glad we could clarify this. CD

## 2018-07-03 NOTE — Telephone Encounter (Signed)
Dr. Brett Fairy,    There seems to be a miscommunication. Do you have another definition of "ORGANIC SLEEP" . The insurance nurse states that this means I slept 6 hours with no medicine. Is this not what "ORGANIC SLEEP" means. If so, the finding are not correct.  I AM ad DID sleep 6 hours during the study with the following 52mb of Quenipen, 322mb of Melotonin, 300mg  of Gabapentin and 2 Excedrin PMs. I along with Insurance is concerned that we can't accomplish this with less effort/medication. I explained I had a sleep study and was told to TAKE my meds upon which I did...  So please explain what was Organic about the sleep study.     Dear Wendy Valencia-  I explained the nature of organic sleep disorders- those caused by sleep disordered breathing, limb movements, parasomnia activity, seizures, hypoxemia,  Hypoventilation, and cardiac arrhythmia.   Insomnia , when not caused by these factors, is considered a non organic sleep disorder. My role is to detect and treat organic sleep disorders.  Chronic Insomnia is treated as a psychological condition.   PS : You were told to bring your medication with you. See My Chart communication with my nurse / sleep staff.  We prefer for our patient's to take sleep aiding medications if they are unable to initiate sleep without it, usually based on a sleep latency exceeding 60 minutes.   Wendy Seat, MD   Cc: Mardene Celeste, RN, RMarland Kitchen Hyler, Manager, and Vicie Mutters, PA-C Referring provider.

## 2018-07-12 ENCOUNTER — Other Ambulatory Visit: Payer: Self-pay | Admitting: Physician Assistant

## 2018-07-12 DIAGNOSIS — M797 Fibromyalgia: Secondary | ICD-10-CM

## 2018-08-15 ENCOUNTER — Other Ambulatory Visit: Payer: Self-pay | Admitting: Adult Health

## 2018-08-15 DIAGNOSIS — M797 Fibromyalgia: Secondary | ICD-10-CM

## 2018-08-21 ENCOUNTER — Other Ambulatory Visit: Payer: Self-pay | Admitting: Physician Assistant

## 2018-08-27 DIAGNOSIS — Z20828 Contact with and (suspected) exposure to other viral communicable diseases: Secondary | ICD-10-CM | POA: Diagnosis not present

## 2018-09-01 DIAGNOSIS — R7309 Other abnormal glucose: Secondary | ICD-10-CM | POA: Insufficient documentation

## 2018-09-01 NOTE — Progress Notes (Signed)
Complete Physical  Assessment and Plan:  Essential hypertension - continue medications, DASH diet, exercise and monitor at home. Call if greater than 130/80.  -     CBC with Differential/Platelet -     COMPLETE METABOLIC PANEL WITH GFR -     TSH -     Urinalysis, Routine w reflex microscopic -     Microalbumin / creatinine urine ratio  Fibromyalgia affecting multiple sites (Secondary Area of Pain)  Continue follow up pain management Consider acupuncture  Chronic pain syndrome Continue follow up pain management  Long term current use of opiate analgesic Continue follow up pain management  Long term prescription benzodiazepine use Continue follow up pain management  Tobacco abuse Discussed stopping, patient is working on it, declines medications  Anxiety state Continue meds  Other insomnia Insomnia- good sleep hygiene discussed, increase day time activity Continue medications  Prediabetes Discussed disease progression and risks Discussed diet/exercise, weight management and risk modification -     Hemoglobin A1c If in DM range will consider SGLT2 or GLP1  Medication management -     Magnesium  Vitamin D deficiency -     VITAMIN D 25 Hydroxy (Vit-D Deficiency, Fractures)  S/P gastric bypass -     Iron,Total/Total Iron Binding Cap -     Folate RBC -     Vitamin B12  BMI 35.0-35.9,adult - - follow up 3 months for progress monitoring Suggest follow up with weight loss clinic with bariatric clinic Discussed meds contributing? Would benefit from topamax - long discussion about weight loss, diet, and exercise, will start the patient on phentermine- hand out given and AE's discussed, will do close follow up.   Mixed hyperlipidemia check lipids decrease fatty foods increase activity.  -     Lipid panel  Screening, anemia, deficiency, iron -     Iron,Total/Total Iron Binding Cap -     Folate RBC -     Vitamin B12   Discussed med's effects and SE's.  Screening labs and tests as requested with regular follow-up as recommended. Over 40 minutes of exam, counseling, chart review, and complex, high level critical decision making was performed this visit.   HPI  53 y.o. female  presents for a complete physical.  She is working 2 week in the home and 2 in the office. Aunt, husband's sister, passed due to [place].   Her blood pressure has been controlled at home,  today their BP is BP: (!) 142/86 She does workout, she is walking. She denies chest pain, shortness of breath, dizziness.   She does smoke, states very rare, 1-2 a day and somedays without any.  CXR 06/2017  She has chronic pain, DJD and follows with pain clinic, Dr. Lowella Dandy.  Had negative sleep study for organic causes, deemed chronic insomnia.   BMI is Body mass index is 39.41 kg/m., she is working on diet and exercise. s/p gastric bypass Sept 2013. She has gained weight with stress. She is on phentermine.  She is also on gabapentin 300 mg TID, seroquel 75 mg QHS, and cymbalta 30m.  She is back at her starting weight from the gastric bypass 7 years ago.  Wt Readings from Last 3 Encounters:  09/03/18 255 lb 6.4 oz (115.8 kg)  04/24/18 247 lb 12.8 oz (112.4 kg)  03/20/18 238 lb (108 kg)    She is not on cholesterol medication and denies myalgias. Her cholesterol is at goal. The cholesterol last visit was:   Lab Results  Component  Value Date   CHOL 173 09/02/2017   HDL 80 09/02/2017   LDLCALC 77 09/02/2017   TRIG 80 09/02/2017   CHOLHDL 2.2 09/02/2017   She has been working on diet and exercise for prediabetes, and denies paresthesia of the feet, polydipsia, polyuria and visual disturbances. Last A1C in the office was:  Lab Results  Component Value Date   HGBA1C 6.2 (H) 04/24/2018   Patient is on Vitamin D supplement.   Lab Results  Component Value Date   VD25OH 38 04/24/2018     She has had elevated alk phos with normal Ab [place], GGT and bone scan in 2001. Has had  negative autoimmune workup in the past.    Current Medications:  Current Outpatient Medications on File Prior to Visit  Medication Sig Dispense Refill  . Albuterol Sulfate (PROVENTIL HFA IN) Inhale 108 mcg into the lungs as needed.    . Aspirin-Acetaminophen-Caffeine (EXCEDRIN EXTRA STRENGTH PO) Take 1 tablet by mouth 3 (three) times daily as needed (headache).     . BELSOMRA 10 MG TABS     . betamethasone dipropionate (DIPROLENE) 0.05 % ointment Apply topically 2 (two) times daily. 30 g 0  . cetirizine-pseudoephedrine (ZYRTEC-D) 5-120 MG tablet Take 1 tablet by mouth every 12 (twelve) hours as needed for allergies.     . diphenhydrAMINE (BENADRYL) 25 MG tablet Take 25-50 mg by mouth 3 (three) times daily as needed for allergies.    . diphenhydrAMINE-APAP, sleep, (HM ACETAMINOPHEN PM EX ST PO) Take by mouth.    . DULoxetine (CYMBALTA) 30 MG capsule Take 1 capsule (30 mg total) by mouth daily. Take with 60 mg cap for total of 90 mg daily. 90 capsule 1  . DULoxetine (CYMBALTA) 60 MG capsule Take 1 capsule (60 mg total) by mouth daily. Take with 30 mg cap for total of 90 mg daily. 90 capsule 1  . ferrous sulfate 325 (65 FE) MG EC tablet Take 325 mg by mouth at bedtime.     . gabapentin (NEURONTIN) 300 MG capsule TAKE ONE TO TWO CAPSULES  UP TO THREE TIMES A DAY AS NEEDED FOR PAIN 180 capsule 0  . Glucosamine-Chondroitin (COSAMIN DS PO) Take 1,500 mg by mouth daily.    Marland Kitchen lisinopril-hydrochlorothiazide (PRINZIDE,ZESTORETIC) 20-12.5 MG tablet TAKE 1 TABLET BY MOUTH EVERY DAY 90 tablet 1  . Magnesium 500 MG TABS Take 500 mg by mouth 2 (two) times daily.     . Multiple Vitamins-Minerals (MULTIVITAMIN WITH MINERALS) tablet Take 1 tablet by mouth daily. .    . naproxen (NAPROSYN) 500 MG tablet Take 1 tablet (500 mg total) by mouth 2 (two) times daily. 30 tablet 0  . OVER THE COUNTER MEDICATION 540 mg.    . phentermine (ADIPEX-P) 37.5 MG tablet TAKE ONE TABLET BY MOUTH DAILY 30 tablet 0  . QUEtiapine  (SEROQUEL) 25 MG tablet Take 3 tablets (75 mg total) by mouth at bedtime. 90 tablet 0  . traMADol (ULTRAM) 50 MG tablet Take 1 tablet (50 mg total) by mouth every 6 (six) hours as needed for severe pain. 120 tablet 5  . TURMERIC PO Take 1,000 mg by mouth daily.    . valACYclovir (VALTREX) 500 MG tablet TAKE ONE TABLET BY MOUTH DAILY 90 tablet 0  . VALERIAN ROOT PO Take 1,000 mg by mouth.    . Vitamin D, Ergocalciferol, (DRISDOL) 1.25 MG (50000 UT) CAPS capsule TAKE ONE CAPSULE BY MOUTH TWICE WEEKLY 8 capsule 3   No current facility-administered medications on  file prior to visit.    Health Maintenance:   Immunization History  Administered Date(s) Administered  . Influenza Inj Mdck Quad With Preservative 10/23/2016  . Influenza Split 10/02/2011, 10/16/2013, 11/15/2014  . Pneumococcal Polysaccharide-23 10/02/2011  . Tdap 08/30/2016   Tetanus: 2018 Pneumovax: 2013 Prevnar 13: N/A Flu vaccine: 2018 Zostavax: N/A  LMP: off depo Pap:08/2016 NEG HPV MGM:  03/2016 OVERDUE DEXA: 09/2013  Colonoscopy: 04/2017 normal EGD: 2013 Myoview stress test 10/2010 Sleep study 05/2018  Last Dental Exam: Dr. Quincy Simmonds Last Eye Exam: Dr Randall Hiss DERM Dr. Raquel Sarna, OB/GYN Patient Care Team: Vicie Mutters, PA-C as PCP - General (Physician Assistant) Himmelrich, Bryson Ha, [place] (Inactive) as Dietitian (Bariatrics) Ladene Artist, MD as Consulting Physician (Gastroenterology)  Medical History:  Past Medical History:  Diagnosis Date  . Allergy   . Anemia   . Anxiety   . Arthritis   . Fibromyalgia   . GERD (gastroesophageal reflux disease)   . Hypertension   . IBS (irritable bowel syndrome)   . Nephrolithiasis   . Plantar fasciitis of right foot    wears boot    Allergies No Known Allergies  SURGICAL HISTORY She  has a past surgical history that includes Left shoulder arthroscopic surgery (yrs ago); tummy tuck (2005); Breath tek h pylori (07/11/2011); Gastric Roux-En-Y (10/01/2011);  Cholecystectomy (10/01/2011); and Colonoscopy.   FAMILY HISTORY Her family history includes Breast cancer (age of onset: 26) in her mother; Breast cancer (age of onset: 20) in her maternal aunt; Cancer in her maternal grandmother; Cancer (age of onset: 76) in her mother; Heart attack in her maternal grandfather and paternal grandfather; Heart disease in her father and maternal grandfather; Hypertension in her father; Lung cancer in her maternal grandmother; Multiple sclerosis in her maternal uncle; Obesity in her brother; Stroke in her father.   SOCIAL HISTORY She  reports that she has been smoking cigarettes. She has a 1.75 pack-year smoking history. She has never used smokeless tobacco. She reports current alcohol use of about 1.0 standard drinks of alcohol per week. She reports that she does not use drugs.   Review of Systems: Review of Systems  Constitutional: Positive for malaise/fatigue. Negative for chills, diaphoresis, fever and weight loss.  Eyes: Negative.   Respiratory: Negative.   Cardiovascular: Negative.   Gastrointestinal: Negative.   Genitourinary: Negative.   Musculoskeletal: Positive for back pain, joint pain, myalgias and neck pain. Negative for falls.  Skin: Negative.   Neurological: Negative.  Negative for weakness.  Psychiatric/Behavioral: Negative.     Physical Exam: Estimated body mass index is 39.41 kg/m as calculated from the following:   Height as of this encounter: 5' 7.5" (1.715 m).   Weight as of this encounter: 255 lb 6.4 oz (115.8 kg). BP (!) 142/86   Pulse 100   Temp 97.6 F (36.4 C)   Ht 5' 7.5" (1.715 m)   Wt 255 lb 6.4 oz (115.8 kg)   SpO2 96%   BMI 39.41 kg/m  General Appearance: Well nourished, in no apparent distress.  Eyes: PERRLA, EOMs, conjunctiva no swelling or erythema, normal fundi and vessels.  Sinuses: No Frontal/maxillary tenderness  ENT/Mouth: Ext aud canals clear, normal light reflex with TMs without erythema, bulging. Good  dentition. No erythema, swelling, or exudate on post pharynx. Tonsils not swollen or erythematous. Hearing normal.  Neck: Supple, thyroid normal. No bruits  Respiratory: Respiratory effort normal, BS equal bilaterally without rales, rhonchi, wheezing or stridor.  Cardio: RRR without murmurs, rubs or gallops. Brisk  peripheral pulses without edema.  Chest: symmetric, with normal excursions and percussion.  Breasts: defer  Abdomen: Soft, nontender, no guarding, rebound, hernias, masses, or organomegaly.  Lymphatics: Non tender without lymphadenopathy.  Genitourinary: defer Musculoskeletal: Full ROM all peripheral extremities,5/5 strength, and normal gait.  Skin: Warm, dry without rashes, lesions, ecchymosis. Neuro: Cranial nerves intact, reflexes equal bilaterally. Normal muscle tone, no cerebellar symptoms. Sensation intact.  Psych: Awake and oriented X 3, normal affect, Insight and Judgment appropriate.   EKG:  WNL AORTA SCAN: defer   Vicie Mutters 9:26 AM [facility]

## 2018-09-03 ENCOUNTER — Other Ambulatory Visit: Payer: Self-pay

## 2018-09-03 ENCOUNTER — Encounter: Payer: Self-pay | Admitting: Physician Assistant

## 2018-09-03 ENCOUNTER — Ambulatory Visit (INDEPENDENT_AMBULATORY_CARE_PROVIDER_SITE_OTHER): Payer: BC Managed Care – PPO | Admitting: Physician Assistant

## 2018-09-03 VITALS — BP 142/86 | HR 100 | Temp 97.6°F | Ht 67.5 in | Wt 255.4 lb

## 2018-09-03 DIAGNOSIS — Z0000 Encounter for general adult medical examination without abnormal findings: Secondary | ICD-10-CM

## 2018-09-03 DIAGNOSIS — Z79899 Other long term (current) drug therapy: Secondary | ICD-10-CM

## 2018-09-03 DIAGNOSIS — Z720 Tobacco use: Secondary | ICD-10-CM

## 2018-09-03 DIAGNOSIS — Z1389 Encounter for screening for other disorder: Secondary | ICD-10-CM | POA: Diagnosis not present

## 2018-09-03 DIAGNOSIS — Z136 Encounter for screening for cardiovascular disorders: Secondary | ICD-10-CM

## 2018-09-03 DIAGNOSIS — G4701 Insomnia due to medical condition: Secondary | ICD-10-CM

## 2018-09-03 DIAGNOSIS — I10 Essential (primary) hypertension: Secondary | ICD-10-CM

## 2018-09-03 DIAGNOSIS — D509 Iron deficiency anemia, unspecified: Secondary | ICD-10-CM

## 2018-09-03 DIAGNOSIS — E559 Vitamin D deficiency, unspecified: Secondary | ICD-10-CM | POA: Diagnosis not present

## 2018-09-03 DIAGNOSIS — Z131 Encounter for screening for diabetes mellitus: Secondary | ICD-10-CM

## 2018-09-03 DIAGNOSIS — G8929 Other chronic pain: Secondary | ICD-10-CM

## 2018-09-03 DIAGNOSIS — F411 Generalized anxiety disorder: Secondary | ICD-10-CM

## 2018-09-03 DIAGNOSIS — Z13220 Encounter for screening for lipoid disorders: Secondary | ICD-10-CM | POA: Diagnosis not present

## 2018-09-03 DIAGNOSIS — Z130 Encounter for screening for diseases of the blood and blood-forming organs and certain disorders involving the immune mechanism: Secondary | ICD-10-CM

## 2018-09-03 DIAGNOSIS — Z1329 Encounter for screening for other suspected endocrine disorder: Secondary | ICD-10-CM | POA: Diagnosis not present

## 2018-09-03 DIAGNOSIS — G894 Chronic pain syndrome: Secondary | ICD-10-CM

## 2018-09-03 DIAGNOSIS — M797 Fibromyalgia: Secondary | ICD-10-CM

## 2018-09-03 DIAGNOSIS — Z0001 Encounter for general adult medical examination with abnormal findings: Secondary | ICD-10-CM

## 2018-09-03 DIAGNOSIS — R7309 Other abnormal glucose: Secondary | ICD-10-CM

## 2018-09-03 DIAGNOSIS — R52 Pain, unspecified: Secondary | ICD-10-CM

## 2018-09-03 DIAGNOSIS — Z9884 Bariatric surgery status: Secondary | ICD-10-CM

## 2018-09-03 DIAGNOSIS — Z6839 Body mass index (BMI) 39.0-39.9, adult: Secondary | ICD-10-CM

## 2018-09-03 NOTE — Patient Instructions (Addendum)
HOW TO SCHEDULE A MAMMOGRAM  [facility]  7 a.m.-6:30 p.m., Monday 7 a.m.-5 p.m., Tuesday-Friday Schedule an appointment by calling [phone].  Suggest following up with gastric bypass weight loss clinic  Depending on labs may suggest different medications to try to help  I want to challenge you to lose 10 lbs before the next appointment.  That would be about 3 lbs a month!  This is very achievable and I know you can do it.  10 lbs is actually 40 lbs of pressure of your joints, back, hips, knees and ankles.  10lbs may be enough to help your blood pressure and cholesterol for next visit.    Please be aware that some of the medications that you are on can sometimes cause a rare and potentially dangerous adverse reaction, called SEROTONIN SYNDROME: Symptoms of this condition include (but are not limited to):  Agitation or restlessness, confusion, rapid heart rate and high blood pressure, dilated pupils, loss of muscle coordination or twitching muscles, muscle rigidity/stiffness, sweating and/or flushing, diarrhea, headache, shivering, goose bumps. If you have any of these symptoms you may have to stop the medication. Call your health care provider immediately.  Severe serotonin syndrome can be life-threatening emergency. Signs and symptoms of a severe reaction may include: high fever, seizures, irregular heartbeat, unconsciousness or altered level of awareness or personality changes.  If you have any of these new symptoms, call 911 or have someone take you to the emergency room.    Intermittent fasting is more about strategy than starvation. It's meant to reset your body in different ways, hopefully with fitness and nutrition changes as a result.  Like any big switchover, though, results may vary when it comes down to the individual level. What works for your friends may not work for you, or vice versa. That's why it's helpful to play around with variations on  intermittent fasting and healthy habits and find what works best for you.  WHAT IS INTERMITTENT FASTING AND WHY DO IT?  Intermittent fasting doesn't involve specific foods, but rather, a strict schedule regarding when you eat. Also called "time-restricted eating," the tactic has been praised for its contribution to weight loss, improved body composition, and decreased cravings. Preliminary research also suggests it may be beneficial for glucose tolerance, hormone regulation, better muscle mass and lower body fat.  Part of its appeal is the simplicity of the effort. Unlike some other trends, there's no calculations to intermittent fasting.  You simply eat within a certain block of time, usually a window of 8-10 hours. In the other big block of time - about 14-16 hours, including when you're asleep - you don't eat anything, not even snacks. You can drink water, coffee, tea or any other beverage that doesn't have calories.  For example, if you like having a late dinner, you might skip breakfast and have your first meal at noon and your last meal of the day at 8 p.m., and then not eat until noon again the next day.  IDEAS FOR GETTING STARTED  If you're new to the strategy, it may be helpful to eat within the typical circadian rhythm and keep eating within daylight hours. This can be especially beneficial if you're looking at intermittent fasting for weight-loss goals.  So first try only eating between 12pm to 8pm.  Outside of this time you may have water, black coffee, and hot tea. You may not eat it drink anything that has carbs, sugars, OR artificial  sugars like diet soda.   Like any major eating and fitness shift, it can take time to find the perfect fit, so don't be afraid to experiment with different options - including ditching intermittent fasting altogether if it's simply not for you. But if it is, you may be surprised by some of the benefits that come along with the strategy.  Who Qualifies  for Obesity Medications? Although everyone is hopeful for a fast and easy way to lose weight, nothing has been shown to replace a prudent, calorie-controlled diet along with behavior modification as a cornerstone for all obesity treatments.  The next tool that can be used to achieve weight-loss and health improvement is medication. Pharmacotherapy may be offered to individuals affected by obesity who have failed to achieve weight-loss through diet and exercise alone. Currently there are several drugs that are approved by the FDA for weight-loss: . phentermine products (Adipex-P or Suprenza)  . phentermine- topiramate ER (Qsymia)  . Bupropion; Naltrexone ER (Contrave)  . Saxenda (liraglutide), once a day weight loss shot Let's take a closer look at each of these medications and learn how they work:   Phentermine-Topiramate ER (Qsymia) How does it work? This combination medication was approved by the FDA in July 2012. Topiramate is a medication used to treat seizures. It was found that a common side effect of this medication was weight-loss. Phentermine, as described in this brochure, helps to increase your energy and decrease your appetite.  Concerns: The most common side effects were dry mouth, constipation and pins-and-needle feeling in extremities.  Qsymia should NOT be taken during pregnancy since Topiramate ER, a component of Qsymia, has been associated with an increased risk of birth defects.  MEDIATIONS SEPARATED: PHENTERMINE How does it work? Phentermine is a medication available by prescription that works on chemicals in the brain to decrease your appetite. It also has a mild stimulant component that adds extra energy. Phentermine is a pill that is taken once a day in the morning time.   Tolerance to this medication normally develops, so it can only be used for several months at a time.  Common side effects are dry mouth, sleeplessness, constipation.  Concerns: Due to its  stimulant effect, a person's blood pressure and heart rate may increase when on this medication; therefore, you must be monitored closely by a physician who is experienced in prescribing this medication. It cannot be used in patients with some heart conditions (such as poorly controlled blood pressure), glaucoma (increased pressure in your eye), stroke or overactive thyroid. There is some concern for abuse, but this is minimal if the medication is appropriately used as directed by a healthcare professional.  TOPIRAMATE Sometimes we will prescribe topiramate AND phentermine together to make Qsymia- separating the medications makes it cheaper.  How to start it start on 1/2 pill for 3-5 nights, can increase to a whole pill for 1-2 weeks.  This medication is good for weight loss, headaches, pain This medication can cause numbness, tingling and can cause brain fog- stop if you get these Check with your eye doctor if you have a history of glaucoma and stop if you severe vision changes or blurry vision.    Bupropion; Naltrexone ER (Contrave) How does it work? Works in two areas of your brain, hunger center and reward center to reduce hunger and cravings.   Concerns Most common side effects are dry mouth, constipation or diarrhea, headache.  Please take it with a full glass of water and low fat  meal.   MEDICATIONS SEPARATED: WELLBUTRIN Only antidepressant that helps with weight loss Used frequently for seasonal effective disorder If you get nausea and HA after the first week please stop- can cause people to clench their jaw- stop it.  If you get anxious or snappy with people than stop the medication But this medication can help with energy, weight loss and mood It kicks in about 1-2 weeks And can be stopped quickly  NALTREXONE You can NOT take this medication if you are on an opioid.  It is used to treat patients that have cravings/addiction to alcohol/opioids.  Taken with supper/dinner.    SAXENDA Is an expensive once a day injectable that helps decrease appetite.  You usually have to fail other therapies and show 6 months of trying before we can get it approved.  Can cause nausea.    Follow-up Visits: Patients are given the opportunity to revisit a topic or obtain more information on an area of interest during follow-up visits. The frequency of and interval between follow-up visits is determined on a patient-by-patient basis. Frequent visits (every 3 to 4 weeks) are encouraged until initial weight-loss goals (5 to 10 percent of body weight) are achieved. At that point, less frequent visits are typically scheduled as needed for individual patients. However, since obesity is considered a chronic life-long problem for many individuals, periodic continual follow up is recommended.   Research has shown that weight-loss as low as 5 percent of initial body weight can lead to favorable improvements in blood pressure, cholesterol, glucose levels and insulin sensitivity. The risk of developing heart disease is reduced the most in patients who have impaired glucose tolerance, type 2 diabetes or high blood pressure.

## 2018-09-04 LAB — CBC WITH DIFFERENTIAL/PLATELET
Absolute Monocytes: 660 cells/uL (ref 200–950)
Basophils Absolute: 20 cells/uL (ref 0–200)
Basophils Relative: 0.3 %
Eosinophils Absolute: 68 cells/uL (ref 15–500)
Eosinophils Relative: 1 %
HCT: 35.2 % (ref 35.0–45.0)
Hemoglobin: 11.2 g/dL — ABNORMAL LOW (ref 11.7–15.5)
Lymphs Abs: 1639 cells/uL (ref 850–3900)
MCH: 24.8 pg — ABNORMAL LOW (ref 27.0–33.0)
MCHC: 31.8 g/dL — ABNORMAL LOW (ref 32.0–36.0)
MCV: 77.9 fL — ABNORMAL LOW (ref 80.0–100.0)
MPV: 10.1 fL (ref 7.5–12.5)
Monocytes Relative: 9.7 %
Neutro Abs: 4413 cells/uL (ref 1500–7800)
Neutrophils Relative %: 64.9 %
Platelets: 339 10*3/uL (ref 140–400)
RBC: 4.52 10*6/uL (ref 3.80–5.10)
RDW: 16.8 % — ABNORMAL HIGH (ref 11.0–15.0)
Total Lymphocyte: 24.1 %
WBC: 6.8 10*3/uL (ref 3.8–10.8)

## 2018-09-04 LAB — LIPID PANEL
Cholesterol: 150 mg/dL (ref <200)
HDL: 57 mg/dL (ref >=50)
LDL Cholesterol (Calc): 76 mg/dL (calc)
Non-HDL Cholesterol (Calc): 93 mg/dL (calc) (ref <130)
Total CHOL/HDL Ratio: 2.6 (calc) (ref <5.0)
Triglycerides: 88 mg/dL (ref <150)

## 2018-09-04 LAB — FOLATE RBC: RBC Folate: 886 ng/mL RBC (ref >280)

## 2018-09-04 LAB — FERRITIN: Ferritin: 6 ng/mL — ABNORMAL LOW (ref 16–232)

## 2018-09-04 LAB — COMPLETE METABOLIC PANEL WITH GFR
AG Ratio: 1.7 (calc) (ref 1.0–2.5)
ALT: 12 U/L (ref 6–29)
AST: 15 U/L (ref 10–35)
Albumin: 4.3 g/dL (ref 3.6–5.1)
Alkaline phosphatase (APISO): 190 U/L — ABNORMAL HIGH (ref 37–153)
BUN: 9 mg/dL (ref 7–25)
CO2: 26 mmol/L (ref 20–32)
Calcium: 9.5 mg/dL (ref 8.6–10.4)
Chloride: 104 mmol/L (ref 98–110)
Creat: 0.7 mg/dL (ref 0.50–1.05)
GFR, Est African American: 115 mL/min/{1.73_m2} (ref >=60)
GFR, Est Non African American: 100 mL/min/{1.73_m2} (ref >=60)
Globulin: 2.5 g/dL (calc) (ref 1.9–3.7)
Glucose, Bld: 109 mg/dL — ABNORMAL HIGH (ref 65–99)
Potassium: 3.9 mmol/L (ref 3.5–5.3)
Sodium: 140 mmol/L (ref 135–146)
Total Bilirubin: 0.3 mg/dL (ref 0.2–1.2)
Total Protein: 6.8 g/dL (ref 6.1–8.1)

## 2018-09-04 LAB — URINALYSIS, ROUTINE W REFLEX MICROSCOPIC
Bilirubin Urine: NEGATIVE
Glucose, UA: NEGATIVE
Hgb urine dipstick: NEGATIVE
Ketones, ur: NEGATIVE
Leukocytes,Ua: NEGATIVE
Nitrite: NEGATIVE
Protein, ur: NEGATIVE
Specific Gravity, Urine: 1.01 (ref 1.001–1.03)
pH: <=5 (ref 5.0–8.0)

## 2018-09-04 LAB — VITAMIN D 25 HYDROXY (VIT D DEFICIENCY, FRACTURES): Vit D, 25-Hydroxy: 39 ng/mL (ref 30–100)

## 2018-09-04 LAB — IRON, TOTAL/TOTAL IRON BINDING CAP
%SAT: 11 % (calc) — ABNORMAL LOW (ref 16–45)
Iron: 43 ug/dL — ABNORMAL LOW (ref 45–160)
TIBC: 405 mcg/dL (calc) (ref 250–450)

## 2018-09-04 LAB — MICROALBUMIN / CREATININE URINE RATIO
Creatinine, Urine: 52 mg/dL (ref 20–275)
Microalb, Ur: <0.2 mg/dL

## 2018-09-04 LAB — VITAMIN B12: Vitamin B-12: 466 pg/mL (ref 200–1100)

## 2018-09-04 LAB — TSH: TSH: 1.12 mIU/L

## 2018-09-04 LAB — HEMOGLOBIN A1C
Hgb A1c MFr Bld: 6.1 % of total Hgb — ABNORMAL HIGH (ref <5.7)
Mean Plasma Glucose: 128 (calc)
eAG (mmol/L): 7.1 (calc)

## 2018-09-04 LAB — MAGNESIUM: Magnesium: 2 mg/dL (ref 1.5–2.5)

## 2018-09-13 ENCOUNTER — Other Ambulatory Visit: Payer: Self-pay | Admitting: Physician Assistant

## 2018-09-15 ENCOUNTER — Other Ambulatory Visit: Payer: Self-pay | Admitting: Internal Medicine

## 2018-09-15 ENCOUNTER — Other Ambulatory Visit: Payer: Self-pay | Admitting: Adult Health

## 2018-09-15 DIAGNOSIS — M797 Fibromyalgia: Secondary | ICD-10-CM

## 2018-09-16 ENCOUNTER — Encounter: Payer: Self-pay | Admitting: Pain Medicine

## 2018-09-17 ENCOUNTER — Other Ambulatory Visit: Payer: Self-pay

## 2018-09-17 ENCOUNTER — Telehealth: Payer: Self-pay | Admitting: *Deleted

## 2018-09-17 ENCOUNTER — Encounter: Payer: Self-pay | Admitting: Pain Medicine

## 2018-09-17 ENCOUNTER — Ambulatory Visit: Payer: BC Managed Care – PPO | Attending: Nurse Practitioner | Admitting: Pain Medicine

## 2018-09-17 ENCOUNTER — Telehealth: Payer: Self-pay

## 2018-09-17 DIAGNOSIS — M7061 Trochanteric bursitis, right hip: Secondary | ICD-10-CM

## 2018-09-17 DIAGNOSIS — M7062 Trochanteric bursitis, left hip: Secondary | ICD-10-CM

## 2018-09-17 DIAGNOSIS — G894 Chronic pain syndrome: Secondary | ICD-10-CM | POA: Diagnosis not present

## 2018-09-17 DIAGNOSIS — M797 Fibromyalgia: Secondary | ICD-10-CM | POA: Diagnosis not present

## 2018-09-17 DIAGNOSIS — R52 Pain, unspecified: Secondary | ICD-10-CM

## 2018-09-17 DIAGNOSIS — G8929 Other chronic pain: Secondary | ICD-10-CM

## 2018-09-17 DIAGNOSIS — M25511 Pain in right shoulder: Secondary | ICD-10-CM | POA: Diagnosis not present

## 2018-09-17 DIAGNOSIS — M25512 Pain in left shoulder: Secondary | ICD-10-CM

## 2018-09-17 DIAGNOSIS — M25552 Pain in left hip: Secondary | ICD-10-CM

## 2018-09-17 DIAGNOSIS — M25551 Pain in right hip: Secondary | ICD-10-CM

## 2018-09-17 MED ORDER — TRAMADOL HCL 50 MG PO TABS: 50.0000 mg | ORAL_TABLET | Freq: Four times a day (QID) | ORAL | 5 refills | Status: DC | PRN

## 2018-09-17 NOTE — Patient Instructions (Signed)

## 2018-09-17 NOTE — Telephone Encounter (Signed)
LVM, MAILED

## 2018-09-17 NOTE — Progress Notes (Signed)
Pain Management Virtual Encounter Note - Virtual Visit via Telephone Telehealth (real-time audio visits between healthcare provider and patient).   Patient's Phone No. & Preferred Pharmacy:  [phone] (home); [phone] (mobile); (Preferred) [phone] [email or web address]  [facility] - [place], [place] - [address] [place] [address] Phone: [phone] Fax: [phone]    Pre-screening note:  Our staff contacted Wendy Valencia and offered her an "in person", "face-to-face" appointment versus a telephone encounter. She indicated preferring the telephone encounter, at this time.   Reason for Virtual Visit: COVID-19*  Social distancing based on CDC and AMA recommendations.   I contacted Wendy Valencia on 09/17/2018 via telephone.      I clearly identified myself as Oswaldo Done, MD. I verified that I was speaking with the correct person using two identifiers (Name: Wendy Valencia, and date of birth: 1965/08/21).  Advanced Informed Consent I sought verbal advanced consent from Wendy Valencia for virtual visit interactions. I informed Wendy Valencia of possible security and privacy concerns, risks, and limitations associated with providing "not-in-person" medical evaluation and management services. I also informed Wendy Valencia of the availability of "in-person" appointments. Finally, I informed her that there would be a charge for the virtual visit and that she could be  personally, fully or partially, financially responsible for it. Wendy Valencia expressed understanding and agreed to proceed.   Historic Elements   Wendy Valencia is a 53 y.o. year old, female patient evaluated today after her last encounter by our practice on Visit date not found. Wendy Valencia  has a past medical history of Allergy, Anemia, Anxiety, Arthritis, Fibromyalgia, GERD (gastroesophageal reflux disease),  Hypertension, IBS (irritable bowel syndrome), Nephrolithiasis, and Plantar fasciitis of right foot. She also  has a past surgical history that includes Left shoulder arthroscopic surgery (yrs ago); tummy tuck (2005); Breath tek h pylori (07/11/2011); Gastric Roux-En-Y (10/01/2011); Cholecystectomy (10/01/2011); and Colonoscopy. Wendy Valencia has a current medication list which includes the following prescription(s): albuterol sulfate, aspirin-acetaminophen-caffeine, betamethasone dipropionate, cetirizine-pseudoephedrine, diphenhydramine, diphenhydramine-apap (sleep), duloxetine, duloxetine, ferrous sulfate, gabapentin, glucosamine-chondroitin, lisinopril-hydrochlorothiazide, magnesium, multivitamin with minerals, naproxen, OVER THE COUNTER MEDICATION, phentermine, quetiapine, senna, turmeric, valacyclovir, valerian, vitamin d (ergocalciferol), belsomra, and tramadol. She  reports that she has been smoking cigarettes. She has a 1.75 pack-year smoking history. She has never used smokeless tobacco. She reports current alcohol use of about 1.0 standard drinks of alcohol per week. She reports that she does not use drugs. Wendy Valencia has No Known Allergies.   HPI  Today, she is being contacted for medication management.  The patient indicates doing well with the current medication regimen. No adverse reactions or side effects reported to the medications.  The patient indicates that she will be needing a repeat hip joint injection.  The last time she had it done was on 06/25/2017 with excellent results.  Pharmacotherapy Assessment  Analgesic: Tramadol 50 mg 2 tablets twice daily (200 mg/day of tramadol) MME/day:20mg /day.   Monitoring: Pharmacotherapy: No side-effects or adverse reactions reported. [place] PMP: PDMP reviewed during this encounter.       Compliance: No problems identified. Effectiveness: Clinically acceptable. Plan: Refer to "POC".  UDS:  Summary  Date Value Ref Range Status  12/26/2017  FINAL  Final    Comment:    ==================================================================== TOXASSURE SELECT 13 (MW) ==================================================================== Test  Result       Flag       Units Drug Present and Declared for Prescription Verification   Tramadol                       >8197        EXPECTED   ng/mg creat   O-Desmethyltramadol            >8197        EXPECTED   ng/mg creat   N-Desmethyltramadol            6198         EXPECTED   ng/mg creat    Source of tramadol is a prescription medication.    O-desmethyltramadol and N-desmethyltramadol are expected    metabolites of tramadol. Drug Present not Declared for Prescription Verification   Lorazepam                      457          UNEXPECTED ng/mg creat    Source of lorazepam is a scheduled prescription medication. ==================================================================== Test                      Result    Flag   Units      Ref Range   Creatinine              61               mg/dL      >=16>=20 ==================================================================== Declared Medications:  The flagging and interpretation on this report are based on the  following declared medications.  Unexpected results may arise from  inaccuracies in the declared medications.  **Note: The testing scope of this panel includes these medications:  Tramadol (Ultram)  **Note: The testing scope of this panel does not include following  reported medications:  Acetaminophen  Albuterol  Aspirin  Benzonatate (Tessalon)  Betamethasone (Diprolene)  Caffeine  Cetirizine (Zyrtec D)  Chondroitin (Glucosamine-Chondroitin)  Dextromethorphan  Diphenhydramine (Benadryl)  Doxepin (Sinequan)  Duloxetine (Cymbalta)  Eye Drops  Gabapentin (Neurontin)  Glucosamine (Glucosamine-Chondroitin)  Hydrochlorothiazide (Lisinopril-HCTZ)  Iron (Ferrous Sulfate)  Lisinopril (Lisinopril-HCTZ)   Magnesium  Medroxyprogesterone (Provera)  Meloxicam (Mobic)  Metoclopramide (Reglan)  Multivitamin (MVI)  Naproxen (Naprosyn)  Phentermine (Adipex-P)  Promethazine  Pseudoephedrine (Zyrtec D)  Supplement  Topical  Valacyclovir (Valtrex)  Vitamin D2 (Drisdol) ==================================================================== For clinical consultation, please call [phone]. ====================================================================    Laboratory Chemistry Profile (12 mo)  Renal: 09/03/2018: BUN 9; BUN/Creatinine Ratio NOT APPLICABLE; Creat 0.70  Lab Results  Component Value Date   GFRAA 115 09/03/2018   GFRNONAA 100 09/03/2018   Hepatic: No results found for requested labs within last 8760 hours. Lab Results  Component Value Date   AST 15 09/03/2018   ALT 12 09/03/2018   Other: 09/03/2018: Vit D, 25-Hydroxy 39; Vitamin B-12 466 Note: Above Lab results reviewed.  Imaging  Last 90 days:  No results found.  Assessment  The primary encounter diagnosis was Chronic pain syndrome. Diagnoses of Chronic generalized pain (Primary Area of Pain), Fibromyalgia affecting multiple sites (Secondary Area of Pain), Chronic shoulder pain (Tertiary Area of Pain) (Bilateral) (R>L), Chronic hip pain (Fourth Area of Pain) (Bilateral) (R>L), and Greater trochanteric bursitis of hips (Bilateral) were also pertinent to this visit.  Plan of Care  I am having Wendy Valencia maintain her multivitamin with minerals, Aspirin-Acetaminophen-Caffeine (EXCEDRIN EXTRA STRENGTH PO), Glucosamine-Chondroitin (  COSAMIN DS PO), ferrous sulfate, Magnesium, cetirizine-pseudoephedrine, diphenhydrAMINE, TURMERIC PO, naproxen, Albuterol Sulfate (PROVENTIL HFA IN), phentermine, betamethasone dipropionate, Belsomra, DULoxetine, DULoxetine, QUEtiapine, valACYclovir, OVER THE COUNTER MEDICATION, VALERIAN ROOT PO, (diphenhydrAMINE-APAP, sleep, (HM ACETAMINOPHEN PM EX ST PO)), Vitamin D (Ergocalciferol),  lisinopril-hydrochlorothiazide, gabapentin, traMADol, and Senna.  Pharmacotherapy (Medications Ordered): Meds ordered this encounter  Medications  . traMADol (ULTRAM) 50 MG tablet    Sig: Take 1 tablet (50 mg total) by mouth every 6 (six) hours as needed for severe pain.    Dispense:  120 tablet    Refill:  5    Chronic Pain: STOP Act (Not applicable) Fill 1 day early if closed on refill date. Do not fill until: 09/22/2018. To last until: 03/21/2019. Avoid benzodiazepines within 8 hours of opioids   Orders:  Orders Placed This Encounter  Procedures  . HIP INJECTION    Standing Status:   Future    Standing Expiration Date:   10/17/2018    Scheduling Instructions:     Side: Bilateral     Sedation: With Sedation.     Timeframe: As soon as schedule allows   Follow-up plan:   Return for Procedure (w/ sedation): (B) Hip inj #2.  Therapeutic bilateral intra-articular hip joint injection #2 + bilateral trochanteric bursa injection #2 under fluoroscopic guidance and IV sedation     Interventional options: Considering: Diagnostic bilateral intra-articular shoulder joint injection Diagnostic bilateral suprascapular nerve block Possible bilateral suprascapular nerve RFA Diagnostic bilateral intra-articular hip joint injection Diagnostic/therapeutic bilateral trochanteric bursa injection Diagnostic bilateral femoral nerve +obturator nerve block Possible bilateral femoral nerve + obturator nerve RFA Diagnostic/therapeuticLidocaine infusion Diagnostic/therapeuticTrigger point injection Diagnostic bilateral cervical facet block Diagnostic bilateral cervical facet RFA   Palliative PRN treatment(s): Diagnostic bilateralintra-articularhipjoint injection#2+ bilateral trochanteric bursa injection #2, under fluoroscopic guidance and IV sedation    Recent Visits No visits were found meeting these conditions.  Showing recent visits within past 90 days and meeting all other  requirements   Today's Visits Date Type Provider Dept  09/17/18 Office Visit Milinda Pointer, MD [facility]  Showing today's visits and meeting all other requirements   Future Appointments No visits were found meeting these conditions.  Showing future appointments within next 90 days and meeting all other requirements   I discussed the assessment and treatment plan with the patient. The patient was provided an opportunity to ask questions and all were answered. The patient agreed with the plan and demonstrated an understanding of the instructions.  Patient advised to call back or seek an in-person evaluation if the symptoms or condition worsens.  Total duration of non-face-to-face encounter: 12 minutes.  Note by: Gaspar Cola, MD Date: 09/17/2018; Time: 12:06 PM  Note: This dictation was prepared with Dragon dictation. Any transcriptional errors that may result from this process are unintentional.  Disclaimer:  * Given the special circumstances of the COVID-19 pandemic, the federal government has announced that the Office for Civil Rights (OCR) will exercise its enforcement discretion and will not impose penalties on physicians using telehealth in the event of noncompliance with regulatory requirements under the [place] (HIPAA) in connection with the good faith provision of telehealth during the GYJEH-63 national public health emergency. (Circle)

## 2018-09-17 NOTE — Telephone Encounter (Signed)
Pt was called again today to update your medications list, no answer.

## 2018-09-24 ENCOUNTER — Ambulatory Visit: Payer: BLUE CROSS/BLUE SHIELD | Admitting: Pain Medicine

## 2018-09-25 ENCOUNTER — Encounter: Payer: BLUE CROSS/BLUE SHIELD | Admitting: Nurse Practitioner

## 2018-10-01 MED ORDER — SAXENDA 18 MG/3ML ~~LOC~~ SOPN: 3.0000 mg | PEN_INJECTOR | Freq: Every day | SUBCUTANEOUS | 0 refills | Status: DC

## 2018-10-06 MED ORDER — NOVOFINE 32G X 6 MM MISC: 5 refills | Status: AC

## 2018-10-06 NOTE — Addendum Note (Signed)
Addended by: Vicie Mutters R on: 10/06/2018 07:58 AM   Modules accepted: Orders

## 2018-10-17 ENCOUNTER — Other Ambulatory Visit: Payer: Self-pay | Admitting: Physician Assistant

## 2018-10-17 DIAGNOSIS — M797 Fibromyalgia: Secondary | ICD-10-CM

## 2018-10-22 ENCOUNTER — Telehealth: Payer: Self-pay | Admitting: *Deleted

## 2018-10-24 ENCOUNTER — Other Ambulatory Visit: Payer: Self-pay | Admitting: Neurology

## 2018-10-27 ENCOUNTER — Encounter: Payer: Self-pay | Admitting: Neurology

## 2018-10-28 ENCOUNTER — Other Ambulatory Visit: Payer: Self-pay

## 2018-10-28 MED ORDER — QUETIAPINE FUMARATE 25 MG PO TABS: 75.0000 mg | ORAL_TABLET | Freq: Every day | ORAL | 3 refills | Status: DC

## 2018-11-14 ENCOUNTER — Other Ambulatory Visit: Payer: Self-pay | Admitting: Adult Health

## 2018-11-14 ENCOUNTER — Other Ambulatory Visit: Payer: Self-pay | Admitting: Physician Assistant

## 2018-11-14 DIAGNOSIS — M797 Fibromyalgia: Secondary | ICD-10-CM

## 2018-12-03 NOTE — Progress Notes (Signed)
FOLLOW UP  Assessment and Plan:   Hypertension Well controlled with current medications  Monitor blood pressure at home; patient to call if consistently greater than 130/80 Continue DASH diet.   Reminder to go to the ER if any CP, SOB, nausea, dizziness, severe HA, changes vision/speech, left arm numbness and tingling and jaw pain.  Cholesterol Currently at goal; continue lifestyle Continue low cholesterol diet and exercise.  Defer lipid panel per patient request  Abnormal Continue diet and exercise.  Perform daily foot/skin check, notify office of any concerning changes.  Check A1C  Morbid Obesity with co morbidities Long discussion about weight loss, diet, and exercise Recommended diet heavy in fruits and veggies and low in animal meats, cheeses, and dairy products, appropriate calorie intake Discussed ideal weight for height Continue saxenda  Vitamin D Def Below goal at last visit; she is taking [id number] IU twice weekly  continue supplementation to maintain goal of 70-100 Check Vit D level to next visit  Tobacco use Discussed risks associated with tobacco use and advised to reduce or quit Patient is not ready to do so, but advised to consider strongly Will follow up at the next visit  Fibromyalgia Continue follow up with pain management  gabapentin to 300-600 mg TID PRN, this has been helpful Continue cymbalta 90 mg Lifestyle discussed: diet/exerise, sleep hygiene, stress management, hydration Recommended CBT; and increasing exercise  Insomnia  Continue medications   Continue diet and meds as discussed. Further disposition pending results of labs. Discussed med's effects and SE's.   Over 30 minutes of exam, counseling, chart review, and critical decision making was performed.   Future Appointments  Date Time Provider Department Center  03/16/2019  9:30 AM Delano MetzNaveira, Francisco, MD [facility] None  09/03/2019  9:00 AM Quentin Mullingollier, Zera Markwardt, PA-C [facility] None     ----------------------------------------------------------------------------------------------------------------------  HPI 53 y.o. female  presents for 3 month follow up on hypertension, cholesterol, prediabetes, morbid obesity and vitamin D deficiency.   She has hx of anxiety, chronic pain, DJD and follows with pain clinic, Dr. Laban EmperorNaveira, taking cymbalta 90 mg, tramadol.  She is also prescribed gabapentin though our office 300-600 mg TID.   She has history of insomnia, has had sleep study that did not demonstrate an organic cause. She has failed trazodone, ambien, lunestra in the past. We have discussed that benzo's are not effective long term. Belsomra was not covered by her insurance.   she currently continues to smoke 0.25 mg pack a day; discussed risks associated with smoking, patient is not ready to quit.   BMI is Body mass index is 37.5 kg/m., she has been working on diet and exercise. She had roux-en-Y in 2013 and lost 80 lb down from 260 to 180 lb but in past few years has been gaining back.   she is prescribed phentermine for weight loss but has been off of phentermine for 2 weeks taking a drug holiday. While on the medication they have lost 12 lbs since last visit. They deny palpitations, anxiety,  elevated BP, has been walking and doing calisthenics.   BMI is Body mass index is 37.5 kg/m., she is working on diet and exercise.Sge is on saxenda and states that it is helping.  Wt Readings from Last 3 Encounters:  12/04/18 243 lb (110.2 kg)  09/03/18 255 lb 6.4 oz (115.8 kg)  04/24/18 247 lb 12.8 oz (112.4 kg)   She has not been checking BP at home due to typically being well controlled, today their BP is  BP: 120/80  She does workout but has not in a week and a half. She denies chest pain, shortness of breath, dizziness.   She is not on cholesterol medication. Her cholesterol is at goal. The cholesterol last visit was:   Lab Results  Component Value Date   CHOL 150  09/03/2018   HDL 57 09/03/2018   LDLCALC 76 09/03/2018   TRIG 88 09/03/2018   CHOLHDL 2.6 09/03/2018    She has been working on diet and exercise for prediabetes, and denies increased appetite, nausea, paresthesia of the feet, polydipsia, polyuria, visual disturbances and vomiting. Last A1C in the office was:  Lab Results  Component Value Date   HGBA1C 6.1 (H) 09/03/2018   Patient is on Vitamin D supplement, taking 50000 twice weekly Lab Results  Component Value Date   VD25OH 39 09/03/2018     Iron she is on once a week.  Lab Results  Component Value Date   IRON 43 (L) 09/03/2018   TIBC 405 09/03/2018   FERRITIN 6 (L) 09/03/2018      Current Medications:    Current Outpatient Medications (Cardiovascular):  .  lisinopril-hydrochlorothiazide (ZESTORETIC) 20-12.5 MG tablet, TAKE 1 TABLET BY MOUTH EVERY DAY  Current Outpatient Medications (Respiratory):  Marland Kitchen  Albuterol Sulfate (PROVENTIL HFA IN), Inhale 108 mcg into the lungs as needed. .  cetirizine-pseudoephedrine (ZYRTEC-D) 5-120 MG tablet, Take 1 tablet by mouth every 12 (twelve) hours as needed for allergies.  .  diphenhydrAMINE (BENADRYL) 25 MG tablet, Take 25-50 mg by mouth 3 (three) times daily as needed for allergies.  Current Outpatient Medications (Analgesics):  Marland Kitchen  Aspirin-Acetaminophen-Caffeine (EXCEDRIN EXTRA STRENGTH PO), Take 1 tablet by mouth 3 (three) times daily as needed (headache).  .  naproxen (NAPROSYN) 500 MG tablet, Take 1 tablet (500 mg total) by mouth 2 (two) times daily. .  traMADol (ULTRAM) 50 MG tablet, Take 1 tablet (50 mg total) by mouth every 6 (six) hours as needed for severe pain.  Current Outpatient Medications (Hematological):  .  ferrous sulfate 325 (65 FE) MG EC tablet, Take 325 mg by mouth at bedtime.   Current Outpatient Medications (Other):  Marland Kitchen  BELSOMRA 10 MG TABS,  .  betamethasone dipropionate (DIPROLENE) 0.05 % ointment, Apply topically 2 (two) times daily. .  diphenhydrAMINE-APAP,  sleep, (HM ACETAMINOPHEN PM EX ST PO), Take by mouth. .  DULoxetine (CYMBALTA) 30 MG capsule, TAKE ONE CAPSULE BY MOUTH DAILY. TAKE WITH 60MG  CAPSULE FOR TOTAL OF 90MG S PER DAY .  DULoxetine (CYMBALTA) 60 MG capsule, Take 1 capsule (60 mg total) by mouth daily. Take with 30 mg cap for total of 90 mg daily. Marland Kitchen  gabapentin (NEURONTIN) 300 MG capsule, TAKE ONE TO TWO CAPSULES BY MOUTH UP TO THREE TIMES A DAY AS NEEDED FOR PAIN .  Glucosamine-Chondroitin (COSAMIN DS PO), Take 1,500 mg by mouth daily. .  Liraglutide -Weight Management (SAXENDA) 18 MG/3ML SOPN, Inject 3 mg into the skin daily. Start 0.6mg  [place] daily x 1 week, then increase dose by 0.6mg  daily every week. Max dose is 3 mg a day. .  Magnesium 500 MG TABS, Take 500 mg by mouth 2 (two) times daily.  .  Multiple Vitamins-Minerals (MULTIVITAMIN WITH MINERALS) tablet, Take 1 tablet by mouth daily. . .  NOVOFINE 32G X 6 MM MISC, Needs daily for victoza .  OVER THE COUNTER MEDICATION, 540 mg. .  phentermine (ADIPEX-P) 37.5 MG tablet, TAKE ONE TABLET BY MOUTH DAILY .  QUEtiapine (SEROQUEL) 25 MG tablet,  Take 3 tablets (75 mg total) by mouth at bedtime. .  Senna 30 MG MISC, Take 30 mg by mouth daily. .  TURMERIC PO, Take 1,000 mg by mouth daily. .  valACYclovir (VALTREX) 500 MG tablet, TAKE ONE TABLET BY MOUTH DAILY .  VALERIAN ROOT PO, Take 1,000 mg by mouth. .  Vitamin D, Ergocalciferol, (DRISDOL) 1.25 MG (50000 UT) CAPS capsule, TAKE ONE CAPSULE BY MOUTH TWICE WEEKLY   Allergies: No Known Allergies   Medical History:  Past Medical History:  Diagnosis Date  . Allergy   . Anemia   . Anxiety   . Arthritis   . Fibromyalgia   . GERD (gastroesophageal reflux disease)   . Hypertension   . IBS (irritable bowel syndrome)   . Nephrolithiasis   . Plantar fasciitis of right foot    wears boot    Family history- Reviewed and unchanged Social history- Reviewed and unchanged   Review of Systems:  Review of Systems  Constitutional: Negative  for malaise/fatigue and weight loss.  HENT: Negative for hearing loss and tinnitus.   Eyes: Negative for blurred vision and double vision.  Respiratory: Negative for cough, shortness of breath and wheezing.   Cardiovascular: Negative for chest pain, palpitations, orthopnea, claudication and leg swelling.  Gastrointestinal: Negative for abdominal pain, blood in stool, constipation, diarrhea, heartburn, melena, nausea and vomiting.  Genitourinary: Negative.   Musculoskeletal: Positive for joint pain and myalgias.  Skin: Negative for rash.  Neurological: Negative for dizziness, tingling, sensory change, weakness and headaches.  Endo/Heme/Allergies: Negative for polydipsia.  Psychiatric/Behavioral: Negative for depression, substance abuse and suicidal ideas. The patient has insomnia. The patient is not nervous/anxious.   All other systems reviewed and are negative.   Physical Exam: BP 120/80   Pulse 70   Temp (!) 97.4 F (36.3 C)   Wt 243 lb (110.2 kg)   SpO2 95%   BMI 37.50 kg/m  Wt Readings from Last 3 Encounters:  12/04/18 243 lb (110.2 kg)  09/03/18 255 lb 6.4 oz (115.8 kg)  04/24/18 247 lb 12.8 oz (112.4 kg)   General Appearance: Well nourished, obese female in no apparent distress. Eyes: PERRLA, EOMs, conjunctiva no swelling or erythema Sinuses: No Frontal/maxillary tenderness ENT/Mouth: Ext aud canals clear, TMs without erythema, bulging. No erythema, swelling, or exudate on post pharynx.  Tonsils not swollen or erythematous. Hearing normal.  Neck: Supple, thyroid normal.  Respiratory: Respiratory effort normal, BS equal bilaterally without rales, rhonchi, wheezing or stridor.  Cardio: RRR with no MRGs. Brisk peripheral pulses without edema.  Abdomen: Soft, obese abdomen, + BS.  Non tender, no guarding, rebound, hernias, masses. Lymphatics: Non tender without lymphadenopathy.  Musculoskeletal: Full ROM, 5/5 strength, Normal gait Skin: Warm, dry without rashes, lesions,  ecchymosis.  Neuro: Cranial nerves intact. No cerebellar symptoms.  Psych: Awake and oriented X 3, normal affect, Insight and Judgment appropriate.    Quentin Mulling, PA-C 4:13 PM [facility]

## 2018-12-04 ENCOUNTER — Ambulatory Visit (INDEPENDENT_AMBULATORY_CARE_PROVIDER_SITE_OTHER): Payer: BC Managed Care – PPO | Admitting: Physician Assistant

## 2018-12-04 ENCOUNTER — Other Ambulatory Visit: Payer: Self-pay

## 2018-12-04 ENCOUNTER — Encounter: Payer: Self-pay | Admitting: Physician Assistant

## 2018-12-04 VITALS — BP 120/80 | HR 70 | Temp 97.4°F | Wt 243.0 lb

## 2018-12-04 DIAGNOSIS — G8929 Other chronic pain: Secondary | ICD-10-CM

## 2018-12-04 DIAGNOSIS — I10 Essential (primary) hypertension: Secondary | ICD-10-CM

## 2018-12-04 DIAGNOSIS — Z79899 Other long term (current) drug therapy: Secondary | ICD-10-CM

## 2018-12-04 DIAGNOSIS — D509 Iron deficiency anemia, unspecified: Secondary | ICD-10-CM

## 2018-12-04 DIAGNOSIS — E559 Vitamin D deficiency, unspecified: Secondary | ICD-10-CM

## 2018-12-04 DIAGNOSIS — G4701 Insomnia due to medical condition: Secondary | ICD-10-CM

## 2018-12-04 DIAGNOSIS — R7309 Other abnormal glucose: Secondary | ICD-10-CM | POA: Diagnosis not present

## 2018-12-04 DIAGNOSIS — Z720 Tobacco use: Secondary | ICD-10-CM

## 2018-12-05 LAB — CBC WITH DIFFERENTIAL/PLATELET
Absolute Monocytes: 618 cells/uL (ref 200–950)
Basophils Absolute: 28 cells/uL (ref 0–200)
Basophils Relative: 0.4 %
Eosinophils Absolute: 50 cells/uL (ref 15–500)
Eosinophils Relative: 0.7 %
HCT: 37.5 % (ref 35.0–45.0)
Hemoglobin: 12.1 g/dL (ref 11.7–15.5)
Lymphs Abs: 1704 cells/uL (ref 850–3900)
MCH: 26.4 pg — ABNORMAL LOW (ref 27.0–33.0)
MCHC: 32.3 g/dL (ref 32.0–36.0)
MCV: 81.7 fL (ref 80.0–100.0)
MPV: 10.2 fL (ref 7.5–12.5)
Monocytes Relative: 8.7 %
Neutro Abs: 4700 cells/uL (ref 1500–7800)
Neutrophils Relative %: 66.2 %
Platelets: 361 10*3/uL (ref 140–400)
RBC: 4.59 10*6/uL (ref 3.80–5.10)
RDW: 16.8 % — ABNORMAL HIGH (ref 11.0–15.0)
Total Lymphocyte: 24 %
WBC: 7.1 10*3/uL (ref 3.8–10.8)

## 2018-12-05 LAB — VITAMIN D 25 HYDROXY (VIT D DEFICIENCY, FRACTURES): Vit D, 25-Hydroxy: 44 ng/mL (ref 30–100)

## 2018-12-05 LAB — COMPLETE METABOLIC PANEL WITH GFR
AG Ratio: 1.9 (calc) (ref 1.0–2.5)
ALT: 8 U/L (ref 6–29)
AST: 13 U/L (ref 10–35)
Albumin: 4.4 g/dL (ref 3.6–5.1)
Alkaline phosphatase (APISO): 172 U/L — ABNORMAL HIGH (ref 37–153)
BUN: 10 mg/dL (ref 7–25)
CO2: 29 mmol/L (ref 20–32)
Calcium: 9.2 mg/dL (ref 8.6–10.4)
Chloride: 103 mmol/L (ref 98–110)
Creat: 0.68 mg/dL (ref 0.50–1.05)
GFR, Est African American: 117 mL/min/{1.73_m2} (ref >=60)
GFR, Est Non African American: 101 mL/min/{1.73_m2} (ref >=60)
Globulin: 2.3 g/dL (calc) (ref 1.9–3.7)
Glucose, Bld: 82 mg/dL (ref 65–99)
Potassium: 4.7 mmol/L (ref 3.5–5.3)
Sodium: 139 mmol/L (ref 135–146)
Total Bilirubin: 0.3 mg/dL (ref 0.2–1.2)
Total Protein: 6.7 g/dL (ref 6.1–8.1)

## 2018-12-05 LAB — HEMOGLOBIN A1C
Hgb A1c MFr Bld: 5.7 % of total Hgb — ABNORMAL HIGH (ref <5.7)
Mean Plasma Glucose: 117 (calc)
eAG (mmol/L): 6.5 (calc)

## 2018-12-05 LAB — TSH: TSH: 0.58 mIU/L

## 2018-12-05 LAB — MAGNESIUM: Magnesium: 2.3 mg/dL (ref 1.5–2.5)

## 2018-12-05 LAB — IRON, TOTAL/TOTAL IRON BINDING CAP
%SAT: 15 % (calc) — ABNORMAL LOW (ref 16–45)
Iron: 56 ug/dL (ref 45–160)
TIBC: 366 mcg/dL (calc) (ref 250–450)

## 2018-12-05 LAB — FERRITIN: Ferritin: 8 ng/mL — ABNORMAL LOW (ref 16–232)

## 2018-12-12 ENCOUNTER — Other Ambulatory Visit: Payer: Self-pay | Admitting: Physician Assistant

## 2018-12-15 ENCOUNTER — Other Ambulatory Visit: Payer: Self-pay | Admitting: Physician Assistant

## 2018-12-15 ENCOUNTER — Other Ambulatory Visit: Payer: Self-pay | Admitting: Adult Health

## 2018-12-15 DIAGNOSIS — M797 Fibromyalgia: Secondary | ICD-10-CM

## 2019-01-02 ENCOUNTER — Other Ambulatory Visit: Payer: Self-pay | Admitting: Physician Assistant

## 2019-01-15 ENCOUNTER — Other Ambulatory Visit: Payer: Self-pay | Admitting: Physician Assistant

## 2019-01-15 DIAGNOSIS — M797 Fibromyalgia: Secondary | ICD-10-CM

## 2019-01-17 ENCOUNTER — Other Ambulatory Visit: Payer: Self-pay | Admitting: Adult Health

## 2019-01-17 ENCOUNTER — Other Ambulatory Visit: Payer: Self-pay | Admitting: Physician Assistant

## 2019-01-17 DIAGNOSIS — M797 Fibromyalgia: Secondary | ICD-10-CM

## 2019-02-13 ENCOUNTER — Other Ambulatory Visit: Payer: Self-pay | Admitting: Physician Assistant

## 2019-02-14 ENCOUNTER — Other Ambulatory Visit: Payer: Self-pay | Admitting: Physician Assistant

## 2019-02-14 DIAGNOSIS — M797 Fibromyalgia: Secondary | ICD-10-CM

## 2019-03-10 DIAGNOSIS — Z20828 Contact with and (suspected) exposure to other viral communicable diseases: Secondary | ICD-10-CM | POA: Diagnosis not present

## 2019-03-12 ENCOUNTER — Encounter: Payer: Self-pay | Admitting: Pain Medicine

## 2019-03-15 NOTE — Progress Notes (Signed)
Patient: Wendy Valencia  Service Category: E/M  Provider: Gaspar Cola, MD  DOB: December 29, 1965  DOS: 03/16/2019  Location: Office  MRN: [id number]  Setting: Ambulatory outpatient  Referring Provider: Vicie Mutters, PA-C  Type: Established Patient  Specialty: Interventional Pain Management  PCP: Vicie Mutters, PA-C  Location: Remote location  Delivery: TeleHealth     Virtual Encounter - Pain Management PROVIDER NOTE: Information contained herein reflects review and annotations entered in association with encounter. Interpretation of such information and data should be left to medically-trained personnel. Information provided to patient can be located elsewhere in the medical record under "Patient Instructions". Document created using STT-dictation technology, any transcriptional errors that may result from process are unintentional.    Contact & Pharmacy Preferred: [phone] Home: [phone] (home) Mobile: [phone] (mobile) E-mail: [email or web address]  Wendy Valencia [address], [place] - [place] [address] Phone: [phone] Fax: [phone]  [place] [address] Phone: [phone] Fax: [phone]   Pre-screening  Wendy Valencia offered "in-person" vs "virtual" encounter. She indicated preferring virtual for this encounter.   Reason COVID-19*  Social distancing based on CDC and AMA recommendations.   I contacted Wendy Valencia on 03/16/2019 via telephone.      I clearly identified myself as Gaspar Cola, MD. I verified that I was speaking with the correct person using two identifiers (Name: Wendy Valencia, and date of birth: Apr 28, 1965).  Consent I sought verbal advanced consent from Wendy Valencia for virtual visit interactions. I informed Wendy Valencia of possible security and privacy  concerns, risks, and limitations associated with providing "not-in-person" medical evaluation and management services. I also informed Ms. Blanchard of the availability of "in-person" appointments. Finally, I informed her that there would be a charge for the virtual visit and that she could be  personally, fully or partially, financially responsible for it. Wendy Valencia expressed understanding and agreed to proceed.   Historic Elements   Wendy Valencia is a 54 y.o. year old, female patient evaluated today after her last contact with our practice on 10/22/2018. Wendy Valencia  has a past medical history of Allergy, Anemia, Anxiety, Arthritis, Fibromyalgia, GERD (gastroesophageal reflux disease), Hypertension, IBS (irritable bowel syndrome), Nephrolithiasis, and Plantar fasciitis of right foot. She also  has a past surgical history that includes Left shoulder arthroscopic surgery (yrs ago); tummy tuck (2005); Breath tek h pylori (07/11/2011); Gastric Roux-En-Y (10/01/2011); Cholecystectomy (10/01/2011); and Colonoscopy. Wendy Valencia has a current medication list which includes the following prescription(s): albuterol sulfate, aspirin-acetaminophen-caffeine, betamethasone dipropionate, cetirizine-pseudoephedrine, diphenhydramine, diphenhydramine-apap (sleep), duloxetine, duloxetine, ferrous sulfate, gabapentin, glucosamine-chondroitin, lisinopril-hydrochlorothiazide, multivitamin with minerals, novofine, OVER THE COUNTER MEDICATION, phentermine, quetiapine, saxenda, senna, [START ON 03/21/2019] tramadol, valacyclovir, valerian, and vitamin d (ergocalciferol). She  reports that she has been smoking cigarettes. She has a 1.75 pack-year smoking history. She has never used smokeless tobacco. She reports current alcohol use of about 1.0 standard drinks of alcohol per week. She reports that she does not use drugs. Wendy Valencia has No Known Allergies.   HPI  Today, she is being contacted for medication  management.  Pharmacotherapy Assessment  Analgesic: Tramadol 50 mg 2 tablets twice daily (200 mg/day of tramadol) MME/day:74m/day.   Monitoring: [place] PMP: PDMP reviewed during this encounter.       Pharmacotherapy: No side-effects or adverse reactions reported. Compliance: No problems identified. Effectiveness:  Clinically acceptable. Plan: Refer to "POC".  UDS:  Summary  Date Value Ref Range Status  12/26/2017 FINAL  Final    Comment:    ==================================================================== TOXASSURE SELECT 13 (MW) ==================================================================== Test                             Result       Flag       Units Drug Present and Declared for Prescription Verification   Tramadol                       >8197        EXPECTED   ng/mg creat   O-Desmethyltramadol            >8197        EXPECTED   ng/mg creat   N-Desmethyltramadol            6198         EXPECTED   ng/mg creat    Source of tramadol is a prescription medication.    O-desmethyltramadol and N-desmethyltramadol are expected    metabolites of tramadol. Drug Present not Declared for Prescription Verification   Lorazepam                      457          UNEXPECTED ng/mg creat    Source of lorazepam is a scheduled prescription medication. ==================================================================== Test                      Result    Flag   Units      Ref Range   Creatinine              61               mg/dL      >=20 ==================================================================== Declared Medications:  The flagging and interpretation on this report are based on the  following declared medications.  Unexpected results may arise from  inaccuracies in the declared medications.  **Note: The testing scope of this panel includes these medications:  Tramadol (Ultram)  **Note: The testing scope of this panel does not include following  reported medications:   Acetaminophen  Albuterol  Aspirin  Benzonatate (Tessalon)  Betamethasone (Diprolene)  Caffeine  Cetirizine (Zyrtec D)  Chondroitin (Glucosamine-Chondroitin)  Dextromethorphan  Diphenhydramine (Benadryl)  Doxepin (Sinequan)  Duloxetine (Cymbalta)  Eye Drops  Gabapentin (Neurontin)  Glucosamine (Glucosamine-Chondroitin)  Hydrochlorothiazide (Lisinopril-HCTZ)  Iron (Ferrous Sulfate)  Lisinopril (Lisinopril-HCTZ)  Magnesium  Medroxyprogesterone (Provera)  Meloxicam (Mobic)  Metoclopramide (Reglan)  Multivitamin (MVI)  Naproxen (Naprosyn)  Phentermine (Adipex-P)  Promethazine  Pseudoephedrine (Zyrtec D)  Supplement  Topical  Valacyclovir (Valtrex)  Vitamin D2 (Drisdol) ==================================================================== For clinical consultation, please call [phone]. ====================================================================    Laboratory Chemistry Profile   Renal Lab Results  Component Value Date   BUN 10 12/04/2018   CREATININE 0.68 12/04/2018   LABCREA 52 34/74/2595   BCR NOT APPLICABLE 63/87/5643   GFRAA 117 12/04/2018   GFRNONAA 101 12/04/2018    Hepatic Lab Results  Component Value Date   AST 13 12/04/2018   ALT 8 12/04/2018   ALBUMIN 4.5 03/05/2016   ALKPHOS 123 (H) 03/05/2016    Electrolytes Lab Results  Component Value Date   NA 139 12/04/2018   K 4.7 12/04/2018   CL 103 12/04/2018   CALCIUM 9.2 12/04/2018  MG 2.3 12/04/2018    Bone Lab Results  Component Value Date   VD25OH 44 12/04/2018    Inflammation (CRP: Acute Phase) (ESR: Chronic Phase) Lab Results  Component Value Date   CRP 0.9 03/04/2017   ESRSEDRATE 22 (H) 03/04/2017      Note: Above Lab results reviewed.  Imaging  Nocturnal polysomnography with REM behavior disorder Dohmeier, Asencion Partridge, MD     06/23/2018  8:36 AM PATIENT'S NAME:  Wendy Valencia DOB:      06/14/2017      MR#:    [id number]     DATE OF RECORDING: 06/13/2018  Farrel Demark REFERRING M.D.:  Vicie Mutters PA-C Study Performed:   Baseline Polysomnogram HISTORY:  --- Sleep and medical history: had a sleep study in  [place], [place] about 10 years ago.  Carries a diagnosis of  morbid obesity with comorbidities, prediabetes,  hypercholesterolemia, hypertension, tobacco abuse, vitamin D  deficiency, she most recently wished to start Cymbalta for the  treatment of fibromyalgia.  She also has a history of irritable bowel syndrome,  gastroesophageal reflux syndrome, arthritis, anemia, allergies,  kidney stones, and plantar fasciitis. The patient has very long  standing Insomnia and carries a diagnosis of Fibromyalgia. Pain  treatment is with Dr. Leeanne Mannan. He treats for hip bursitis. Interestingly, the patient was prescribed phentermine for weight  loss (which can cause insomnia) and was taken off in mid -March  2020 after she lost zero weight since the last visit to primary  care office. She had confessed to stress eating grazing during  the day.  Body mass index is a 38 kg/m2. Hemoglobin A1c was 5.9,  she is pre-diabetic.  She states her total sleep time with medication and only then  amounts to 4 hours.  She does not suffer from excessive daytime  sleepiness and she does not take naps even if she would try to  nap she could not fall asleep.  The patient endorsed the Epworth Sleepiness Scale at 0 points.  The patient's weight 247 pounds with a height of 68 (inches),  resulting in a BMI of 37.4 kg/m2.The patient's neck circumference  measured 17 inches.  CURRENT MEDICATIONS: none listed by tech   PROCEDURE:  This is a multichannel digital polysomnogram  utilizing the Somnostar 11.2 system.  Electrodes and sensors were  applied and monitored per AASM Specifications.   EEG, EOG, Chin  and Limb EMG, were sampled at 200 Hz.  ECG, Snore and Nasal  Pressure, Thermal Airflow, Respiratory Effort, CPAP Flow and  Pressure, Oximetry was sampled at 50 Hz.  Digital video and audio  were recorded.      BASELINE STUDY: Lights Out was at 22:41 and Lights On at 04:59.   Total recording time (TRT) was 379 minutes, with a total sleep  time (TST) of 352.5 minutes.   The patient's sleep latency was  15.5 minutes.  REM latency was 98.5 minutes.  The sleep  efficiency was 93.0%.     SLEEP ARCHITECTURE: WASO (Wake after sleep onset) was 23.5  minutes.  There were 6.5 minutes in Stage N1, 276 minutes Stage  N2, 43.5 minutes Stage N3 and 26.5 minutes in Stage REM.  The  percentage of Stage N1 was 1.8%, Stage N2 was 78.3%, Stage N3 was  12.3% and Stage R (REM sleep) was 7.5%.   RESPIRATORY ANALYSIS:  There were a total of 17 respiratory  events:  0 obstructive apneas, 0 central apneas and 0 mixed  apneas with a total  of 0 apneas and an apnea index (AI) of 0  /hour. There were 17 hypopneas with a hypopnea index of 2.9  /hour. The patient also had 0 respiratory event related arousals  (RERAs).     The total APNEA/HYPOPNEA INDEX (AHI) was 2.9 /hour and the total  RESPIRATORY DISTURBANCE INDEX was 0. 2.9 /hour.  0 events  occurred in REM sleep and 34 events in NREM. The REM AHI was 0. 0  /hour, versus a non-REM AHI of 3.1. The patient spent 278 minutes  of total sleep time in the supine position and 75 minutes in  non-supine. The supine AHI was 2.2 versus a non-supine AHI of  5.6.  OXYGEN SATURATION & C02:  The Wake baseline 02 saturation was  96%, with the lowest being 81%. Time spent below 89% saturation  equaled 12 minutes. The arousals were noted as: 61 were spontaneous, 0 were  associated with PLMs, and 11 were associated with respiratory  events. The patient had a total of 0 Periodic Limb Movements.   The Periodic Limb Movement (PLM) index was 0 and the PLM Arousal  index was 0/hour.  Audio and video analysis did not show any abnormal or unusual  movements, behaviors, phonations or vocalizations. There were  several very brief REM sleep  phases before 3.45 AM, and at that  time REM sleep reached almost 28 minutes.  Sleep was not very  fragmented and overall sleep time was excellent.  The patient didn't have bathroom breaks. She slept for 4 hours  supine and only slept on her side for the very last hours. Sleep  efficiency was 93%! Mild Snoring was noted. EKG was in keeping with normal sinus  rhythm (NSR). Post-study, the patient indicated that sleep was poor, restless  and interrupted.   IMPRESSION:  1. No evidence of Obstructive Sleep Apnea (OSA) - No PLMs, and no  Insomnia- total sleep time was close to 6 hours.   2. Normal EKG.  RECOMMENDATIONS:  1. No need for CPAP titration or other interventional therapy.   There was no organic sleep disorder identified.   I certify that I have reviewed the entire raw data recording  prior to the issuance of this report in accordance with the  Standards of Accreditation of the American Academy of Sleep  Medicine (AASM)  Larey Seat, MD    06-23-2018 Diplomat, American Board of Psychiatry and Neurology  Diplomat, American Board of Sleep Medicine Medical Director, [facility] [place], [place]  Assessment  The primary encounter diagnosis was Chronic pain syndrome. Diagnoses of Chronic generalized pain (Primary Area of Pain), Fibromyalgia affecting multiple sites (Secondary Area of Pain), Chronic shoulder pain (Tertiary Area of Pain) (Bilateral) (R>L), and Chronic hip pain (Fourth Area of Pain) (Bilateral) (R>L) were also pertinent to this visit.  Plan of Care  Problem-specific:  No problem-specific Assessment & Plan notes found for this encounter.  [name] has a current medication list which includes the following long-term medication(s): diphenhydramine-apap (sleep), duloxetine, duloxetine, gabapentin, lisinopril-hydrochlorothiazide, phentermine, quetiapine, and [START ON 03/21/2019] tramadol.  Pharmacotherapy (Medications  Ordered): Meds ordered this encounter  Medications  . traMADol (ULTRAM) 50 MG tablet    Sig: Take 1 tablet (50 mg total) by mouth every 6 (six) hours as needed for severe pain.    Dispense:  120 tablet    Refill:  5    Chronic Pain: STOP Act (Not applicable) Fill 1 day early if closed on refill date.  Do not fill until: 03/21/2019. To last until: 09/17/2019. Avoid benzodiazepines within 8 hours of opioids   Orders:  No orders of the defined types were placed in this encounter.  Follow-up plan:   Return in about 6 months (around 09/16/2019) for (VV), (MM).      Interventional options: Considering: Diagnostic bilateral intra-articular shoulder joint injection Diagnostic bilateral suprascapular nerve block Possible bilateral suprascapular nerve RFA Diagnostic bilateral intra-articular hip joint injection Diagnostic/therapeutic bilateral trochanteric bursa injection Diagnostic bilateral femoral nerve +obturator nerve block Possible bilateral femoral nerve + obturator nerve RFA Diagnostic/therapeuticLidocaine infusion Diagnostic/therapeuticTrigger point injection Diagnostic bilateral cervical facet block Diagnostic bilateral cervical facet RFA   Palliative PRN treatment(s): Diagnostic bilateralintra-articularhipjoint injection#2+ bilateral trochanteric bursa injection #2, under fluoroscopic guidance and IV sedation     Recent Visits No visits were found meeting these conditions.  Showing recent visits within past 90 days and meeting all other requirements   Today's Visits Date Type Provider Dept  03/16/19 Telemedicine Milinda Pointer, MD [facility]  Showing today's visits and meeting all other requirements   Future Appointments No visits were found meeting these conditions.  Showing future appointments within next 90 days and meeting all other requirements   I discussed the assessment and treatment plan with the patient. The patient was provided  an opportunity to ask questions and all were answered. The patient agreed with the plan and demonstrated an understanding of the instructions.  Patient advised to call back or seek an in-person evaluation if the symptoms or condition worsens.  Duration of encounter: 12 minutes.  Note by: Gaspar Cola, MD Date: 03/16/2019; Time: 11:21 AM

## 2019-03-16 ENCOUNTER — Ambulatory Visit: Payer: BC Managed Care – PPO | Attending: Pain Medicine | Admitting: Pain Medicine

## 2019-03-16 ENCOUNTER — Other Ambulatory Visit: Payer: Self-pay

## 2019-03-16 DIAGNOSIS — M797 Fibromyalgia: Secondary | ICD-10-CM | POA: Diagnosis not present

## 2019-03-16 DIAGNOSIS — M25551 Pain in right hip: Secondary | ICD-10-CM | POA: Diagnosis not present

## 2019-03-16 DIAGNOSIS — M25512 Pain in left shoulder: Secondary | ICD-10-CM

## 2019-03-16 DIAGNOSIS — M25511 Pain in right shoulder: Secondary | ICD-10-CM

## 2019-03-16 DIAGNOSIS — G894 Chronic pain syndrome: Secondary | ICD-10-CM | POA: Diagnosis not present

## 2019-03-16 DIAGNOSIS — M25552 Pain in left hip: Secondary | ICD-10-CM

## 2019-03-16 DIAGNOSIS — G8929 Other chronic pain: Secondary | ICD-10-CM

## 2019-03-16 MED ORDER — TRAMADOL HCL 50 MG PO TABS: 50.0000 mg | ORAL_TABLET | Freq: Four times a day (QID) | ORAL | 5 refills | Status: DC | PRN

## 2019-03-22 ENCOUNTER — Other Ambulatory Visit: Payer: Self-pay | Admitting: Physician Assistant

## 2019-04-03 ENCOUNTER — Ambulatory Visit: Payer: BC Managed Care – PPO | Attending: Internal Medicine

## 2019-04-03 DIAGNOSIS — Z23 Encounter for immunization: Secondary | ICD-10-CM

## 2019-04-03 NOTE — Progress Notes (Signed)
FOLLOW UP  Assessment and Plan:   Hypertension Well controlled with current medications  Monitor blood pressure at home; patient to call if consistently greater than 130/80 Continue DASH diet.   Reminder to go to the ER if any CP, SOB, nausea, dizziness, severe HA, changes vision/speech, left arm numbness and tingling and jaw pain.  Cholesterol Currently at goal; continue lifestyle Continue low cholesterol diet and exercise.  Defer lipid panel per patient request  Abnormal Continue diet and exercise.  Perform daily foot/skin check, notify office of any concerning changes.  Check A1C  Morbid Obesity with co morbidities Long discussion about weight loss, diet, and exercise Recommended diet heavy in fruits and veggies and low in animal meats, cheeses, and dairy products, appropriate calorie intake Discussed ideal weight for height Continue saxenda  Vitamin D Def Below goal at last visit; she is taking 78938 IU twice weekly  continue supplementation to maintain goal of 70-100 Check Vit D level to next visit  Tobacco use Discussed risks associated with tobacco use and advised to reduce or quit Patient is not ready to do so, but advised to consider strongly Will follow up at the next visit  Fibromyalgia Continue follow up with pain management  Lifestyle discussed: diet/exerise, sleep hygiene, stress management, hydration Recommended CBT; and increasing exercise  IBS If not on benefiber then add it, given samples of linzess and she will call with which dose does best, likely related to saxenda as well, increase water, decrease stress,  if any worsening symptoms, blood in stool, AB pain, etc call office  Continue diet and meds as discussed. Further disposition pending results of labs. Discussed med's effects and SE's.   Over 30 minutes of exam, counseling, chart review, and critical decision making was performed.   Future Appointments  Date Time Provider Department Center   04/29/2019  3:30 PM [facility]  09/03/2019  9:00 AM Quentin Mulling, PA-C [facility] None  09/16/2019  8:00 AM Delano Metz, MD [facility] None    ----------------------------------------------------------------------------------------------------------------------  HPI 54 y.o. female  presents for 3 month follow up on hypertension, cholesterol, prediabetes, morbid obesity and vitamin D deficiency.   She is having a hard time concentrating due to grief from her dad dying recently and seeing counselor  She has hx of anxiety, chronic pain, DJD and follows with pain clinic, Dr. Laban Emperor, taking cymbalta 90 mg, tramadol.  She is also prescribed gabapentin though our office 300-600 mg TID.   She has history of insomnia, has had sleep study that did not demonstrate an organic cause. She has failed trazodone, ambien, lunestra in the past. We have discussed that benzo's are not effective long term. Belsomra was not covered by her insurance. She is on seroquel.   she currently continues to smoke 0.25 mg pack a day; discussed risks associated with smoking, patient is not ready to quit.   BMI is Body mass index is 34.41 kg/m., she has been working on diet and exercise. She had roux-en-Y in 2013 and lost 80 lb down from 260 to 180 lb but in past few years has been gaining back. She is on saxenda and has been doing well. She has IBS and was on amitiza in the past but this was not working well, she feels very bloated and has some AB cramping with constipation. Would like to try linzess.  BMI is Body mass index is 34.41 kg/m., she is working on diet and exercise. Wt Readings from Last 3 Encounters:  04/06/19 223 lb (101.2 kg)  12/04/18 243 lb (110.2 kg)  09/03/18 255 lb 6.4 oz (115.8 kg)   She has not been checking BP at home due to typically being well controlled, today their BP is BP: 126/62  She does workout but has not in a week and a half. She denies chest pain,  shortness of breath, dizziness.   She is not on cholesterol medication. Her cholesterol is at goal. The cholesterol last visit was:   Lab Results  Component Value Date   CHOL 150 09/03/2018   HDL 57 09/03/2018   LDLCALC 76 09/03/2018   TRIG 88 09/03/2018   CHOLHDL 2.6 09/03/2018    She has been working on diet and exercise for prediabetes, and denies increased appetite, nausea, paresthesia of the feet, polydipsia, polyuria, visual disturbances and vomiting. Last A1C in the office was:  Lab Results  Component Value Date   HGBA1C 5.7 (H) 12/04/2018   Patient is on Vitamin D supplement, taking 50000 twice weekly Lab Results  Component Value Date   VD25OH 44 12/04/2018     Iron she is on once a week.  Lab Results  Component Value Date   IRON 56 12/04/2018   TIBC 366 12/04/2018   FERRITIN 8 (L) 12/04/2018      Current Medications:    Current Outpatient Medications (Cardiovascular):  .  lisinopril-hydrochlorothiazide (ZESTORETIC) 20-12.5 MG tablet, TAKE 1 TABLET BY MOUTH EVERY DAY  Current Outpatient Medications (Respiratory):  Marland Kitchen  Albuterol Sulfate (PROVENTIL HFA IN), Inhale 108 mcg into the lungs as needed. .  cetirizine-pseudoephedrine (ZYRTEC-D) 5-120 MG tablet, Take 1 tablet by mouth every 12 (twelve) hours as needed for allergies.  .  diphenhydrAMINE (BENADRYL) 25 MG tablet, Take 25-50 mg by mouth 3 (three) times daily as needed for allergies.  Current Outpatient Medications (Analgesics):  Marland Kitchen  Aspirin-Acetaminophen-Caffeine (EXCEDRIN EXTRA STRENGTH PO), Take 1 tablet by mouth 3 (three) times daily as needed (headache).  .  traMADol (ULTRAM) 50 MG tablet, Take 1 tablet (50 mg total) by mouth every 6 (six) hours as needed for severe pain.  Current Outpatient Medications (Hematological):  .  ferrous sulfate 325 (65 FE) MG EC tablet, Take 325 mg by mouth every other day.   Current Outpatient Medications (Other):  .  betamethasone dipropionate (DIPROLENE) 0.05 % ointment,  Apply topically 2 (two) times daily. .  diphenhydrAMINE-APAP, sleep, (HM ACETAMINOPHEN PM EX ST PO), Take by mouth. .  DULoxetine (CYMBALTA) 30 MG capsule, TAKE ONE CAPSULE BY MOUTH DAILY WITH 60MG  CAPSULE FOR TOTAL OF 90MG S PER DAY (Patient taking differently: 90 mg daily. ) .  DULoxetine (CYMBALTA) 60 MG capsule, Take 1 capsule (60 mg total) by mouth daily. Take with 30 mg cap for total of 90 mg daily. Marland Kitchen  gabapentin (NEURONTIN) 300 MG capsule, TAKE 1 TO 2 CAPSULES BY MOUTH UP TO THREE TIMES A DAY AS NEEDED FOR PAIN .  Glucosamine-Chondroitin (COSAMIN DS PO), Take 1,500 mg by mouth daily. .  Multiple Vitamins-Minerals (MULTIVITAMIN WITH MINERALS) tablet, Take 1 tablet by mouth daily. . .  NOVOFINE 32G X 6 MM MISC, Needs daily for victoza .  OVER THE COUNTER MEDICATION, 540 mg. .  phentermine (ADIPEX-P) 37.5 MG tablet, TAKE ONE TABLET BY MOUTH DAILY .  QUEtiapine (SEROQUEL) 25 MG tablet, Take 3 tablets (75 mg total) by mouth at bedtime. Marland Kitchen  SAXENDA 18 MG/3ML SOPN, INJECT 0.6MG  SUBCUTANEOUSLY X ONE WEEK THEN INCREASE BY 0.6MG  DAILY EVERY WEEK. MAX DOSE IS THREE MG DAILY .  Senna 30 MG MISC, Take 30 mg by mouth daily. .  valACYclovir (VALTREX) 500 MG tablet, TAKE ONE TABLET BY MOUTH DAILY .  VALERIAN ROOT PO, Take 1,000 mg by mouth. .  Vitamin D, Ergocalciferol, (DRISDOL) 1.25 MG (50000 UT) CAPS capsule, TAKE 1 CAPSULE BY MOUTH TWICE WEEKLY   Allergies: No Known Allergies   Medical History:  Past Medical History:  Diagnosis Date  . Allergy   . Anemia   . Anxiety   . Arthritis   . Fibromyalgia   . GERD (gastroesophageal reflux disease)   . Hypertension   . IBS (irritable bowel syndrome)   . Nephrolithiasis   . Plantar fasciitis of right foot    wears boot    Family history- Reviewed and unchanged Social history- Reviewed and unchanged   Review of Systems:  Review of Systems  Constitutional: Negative for malaise/fatigue and weight loss.  HENT: Negative for hearing loss and  tinnitus.   Eyes: Negative for blurred vision and double vision.  Respiratory: Negative for cough, shortness of breath and wheezing.   Cardiovascular: Negative for chest pain, palpitations, orthopnea, claudication and leg swelling.  Gastrointestinal: Negative for abdominal pain, blood in stool, constipation, diarrhea, heartburn, melena, nausea and vomiting.  Genitourinary: Negative.   Musculoskeletal: Positive for joint pain and myalgias.  Skin: Negative for rash.  Neurological: Negative for dizziness, tingling, sensory change, weakness and headaches.  Endo/Heme/Allergies: Negative for polydipsia.  Psychiatric/Behavioral: Negative for depression, substance abuse and suicidal ideas. The patient has insomnia. The patient is not nervous/anxious.   All other systems reviewed and are negative.   Physical Exam: BP 126/62   Pulse 88   Temp (!) 97.3 F (36.3 C)   Wt 223 lb (101.2 kg)   SpO2 96%   BMI 34.41 kg/m  Wt Readings from Last 3 Encounters:  04/06/19 223 lb (101.2 kg)  12/04/18 243 lb (110.2 kg)  09/03/18 255 lb 6.4 oz (115.8 kg)   General Appearance: Well nourished, obese female in no apparent distress. Eyes: PERRLA, EOMs, conjunctiva no swelling or erythema Sinuses: No Frontal/maxillary tenderness ENT/Mouth: Ext aud canals clear, TMs without erythema, bulging. No erythema, swelling, or exudate on post pharynx.  Tonsils not swollen or erythematous. Hearing normal.  Neck: Supple, thyroid normal.  Respiratory: Respiratory effort normal, BS equal bilaterally without rales, rhonchi, wheezing or stridor.  Cardio: RRR with no MRGs. Brisk peripheral pulses without edema.  Abdomen: Soft, obese abdomen, + BS.  Non tender, no guarding, rebound, hernias, masses. Lymphatics: Non tender without lymphadenopathy.  Musculoskeletal: Full ROM, 5/5 strength, Normal gait Skin: Warm, dry without rashes, lesions, ecchymosis.  Neuro: Cranial nerves intact. No cerebellar symptoms.  Psych: Awake and  oriented X 3, normal affect, Insight and Judgment appropriate.    Quentin Mulling, PA-C 4:10 PM [facility]

## 2019-04-03 NOTE — Progress Notes (Signed)
   Covid-19 Vaccination Clinic  Name:  ZAMARA COZAD    MRN: [id number] DOB: 1965/12/10  04/03/2019  Ms. Terada was observed post Covid-19 immunization for 15 minutes without incident. She was provided with Vaccine Information Sheet and instruction to access the V-Safe system.   Ms. Dimiceli was instructed to call 911 with any severe reactions post vaccine: Marland Kitchen Difficulty breathing  . Swelling of face and throat  . A fast heartbeat  . A bad rash all over body  . Dizziness and weakness   Immunizations Administered    Name Date Dose VIS Date Route   Pfizer COVID-19 Vaccine 04/03/2019  5:06 PM 0.3 mL 12/26/2018 Intramuscular   Manufacturer: [employer], [employer]   Lot: [id number]   NDC: [address]-7

## 2019-04-06 ENCOUNTER — Other Ambulatory Visit: Payer: Self-pay

## 2019-04-06 ENCOUNTER — Ambulatory Visit (INDEPENDENT_AMBULATORY_CARE_PROVIDER_SITE_OTHER): Payer: BC Managed Care – PPO | Admitting: Physician Assistant

## 2019-04-06 ENCOUNTER — Encounter: Payer: Self-pay | Admitting: Physician Assistant

## 2019-04-06 VITALS — BP 126/62 | HR 88 | Temp 97.3°F | Wt 223.0 lb

## 2019-04-06 DIAGNOSIS — E782 Mixed hyperlipidemia: Secondary | ICD-10-CM

## 2019-04-06 DIAGNOSIS — K581 Irritable bowel syndrome with constipation: Secondary | ICD-10-CM

## 2019-04-06 DIAGNOSIS — Z13220 Encounter for screening for lipoid disorders: Secondary | ICD-10-CM

## 2019-04-06 DIAGNOSIS — D509 Iron deficiency anemia, unspecified: Secondary | ICD-10-CM

## 2019-04-06 DIAGNOSIS — Z79899 Other long term (current) drug therapy: Secondary | ICD-10-CM | POA: Diagnosis not present

## 2019-04-06 DIAGNOSIS — I10 Essential (primary) hypertension: Secondary | ICD-10-CM | POA: Diagnosis not present

## 2019-04-06 DIAGNOSIS — E559 Vitamin D deficiency, unspecified: Secondary | ICD-10-CM | POA: Diagnosis not present

## 2019-04-06 DIAGNOSIS — R7309 Other abnormal glucose: Secondary | ICD-10-CM | POA: Diagnosis not present

## 2019-04-06 DIAGNOSIS — Z720 Tobacco use: Secondary | ICD-10-CM

## 2019-04-06 MED ORDER — QUETIAPINE FUMARATE 25 MG PO TABS: 75.0000 mg | ORAL_TABLET | Freq: Every day | ORAL | 3 refills | Status: DC

## 2019-04-06 MED ORDER — LINACLOTIDE 290 MCG PO CAPS: 290.0000 ug | ORAL_CAPSULE | Freq: Every day | ORAL | 0 refills | Status: DC

## 2019-04-06 NOTE — Patient Instructions (Addendum)
Linzess  Take at least 30 minutes before the first meal of the day on an empty stomach You can have a loose stool if you eat a high-fat breakfast.   Linaclotide oral capsules What is this medicine? LINACLOTIDE (lin a KLOE tide) is used to treat irritable bowel syndrome (IBS) with constipation as the main problem. It may also be used for relief of chronic constipation. This medicine may be used for other purposes; ask your health care provider or pharmacist if you have questions. COMMON BRAND NAME(S): Linzess What should I tell my health care provider before I take this medicine? They need to know if you have any of these conditions:  history of stool (fecal) impaction  now have diarrhea or have diarrhea often  other medical condition  stomach or intestinal disease, including bowel obstruction or abdominal adhesions  an unusual or allergic reaction to linaclotide, other medicines, foods, dyes, or preservatives  pregnant or trying to get pregnant  breast-feeding How should I use this medicine? Take this medicine by mouth with a glass of water. Follow the directions on the prescription label. Do not cut, crush or chew this medicine. Take on an empty stomach, at least 30 minutes before your first meal of the day. Take your medicine at regular intervals. Do not take your medicine more often than directed. Do not stop taking except on your doctor's advice. A special MedGuide will be given to you by the pharmacist with each prescription and refill. Be sure to read this information carefully each time. Talk to your pediatrician regarding the use of this medicine in children. This medicine is not approved for use in children. Overdosage: If you think you have taken too much of this medicine contact a poison control center or emergency room at once. NOTE: This medicine is only for you. Do not share this medicine with others. What if I miss a dose? If you miss a dose, just skip that dose. Wait  until your next dose, and take only that dose. Do not take double or extra doses. What may interact with this medicine?  certain medicines for bowel problems or bladder incontinence (these can cause constipation) This list may not describe all possible interactions. Give your health care provider a list of all the medicines, herbs, non-prescription drugs, or dietary supplements you use. Also tell them if you smoke, drink alcohol, or use illegal drugs. Some items may interact with your medicine. What should I watch for while using this medicine? Visit your doctor for regular check ups. Tell your doctor if your symptoms do not get better or if they get worse. Diarrhea is a common side effect of this medicine. It often begins within 2 weeks of starting this medicine. Stop taking this medicine and call your doctor if you get severe diarrhea. Stop taking this medicine and call your doctor or go to the nearest hospital emergency room right away if you develop unusual or severe stomach-area (abdominal) pain, especially if you also have bright red, bloody stools or black stools that look like tar. What side effects may I notice from receiving this medicine? Side effects that you should report to your doctor or health care professional as soon as possible:  allergic reactions like skin rash, itching or hives, swelling of the face, lips, or tongue  black, tarry stools  bloody or watery diarrhea  new or worsening stomach pain  severe or prolonged diarrhea Side effects that usually do not require medical attention (report to your doctor  or health care professional if they continue or are bothersome):  bloating  gas  loose stools This list may not describe all possible side effects. Call your doctor for medical advice about side effects. You may report side effects to FDA at 1-800-FDA-1088. Where should I keep my medicine? Keep out of the reach of children. Store at room temperature between 20 and  25 degrees C (68 and 77 degrees F). Keep this medicine in the original container. Keep tightly closed in a dry place. Do not remove the desiccant packet from the bottle, it helps to protect your medicine from moisture. Throw away any unused medicine after the expiration date. NOTE: This sheet is a summary. It may not cover all possible information. If you have questions about this medicine, talk to your doctor, pharmacist, or health care provider.  2020 Elsevier/Gold Standard (2015-02-03 12:17:04)  Insulin Injection Instructions, Using Insulin Pens, Adult A subcutaneous injection is a shot of medicine that is injected into the layer of fat and tissue between skin and muscle. People with type 1 diabetes must take insulin because their bodies do not make it. People with type 2 diabetes may need to take insulin. There are many different types of insulin. The type of insulin that you take may determine how many injections you give yourself and when you need to give the injections. Supplies needed:  Soap and water to wash hands.  Your insulin pen.  A new, unused needle.  Alcohol wipes.  A disposal container that is meant for sharp items (sharps container), such as an empty plastic bottle with a cover. How to choose a site for injection The body absorbs insulin differently, depending on where the insulin is injected (injection site). It is best to inject insulin into the same body area each time (for example, always in the abdomen), but you should use a different spot in that area for each injection. Do not inject the insulin in the same spot each time. There are five main areas that can be used for injecting. These areas include:  Abdomen. This is the preferred area.  Front of thigh.  Upper, outer side of thigh.  Upper, outer side of arm.  Upper, outer part of buttock.

## 2019-04-07 LAB — FERRITIN: Ferritin: 8 ng/mL — ABNORMAL LOW (ref 16–232)

## 2019-04-07 LAB — CBC WITH DIFFERENTIAL/PLATELET
Absolute Monocytes: 586 cells/uL (ref 200–950)
Basophils Absolute: 31 cells/uL (ref 0–200)
Basophils Relative: 0.5 %
Eosinophils Absolute: 49 cells/uL (ref 15–500)
Eosinophils Relative: 0.8 %
HCT: 38.1 % (ref 35.0–45.0)
Hemoglobin: 12.4 g/dL (ref 11.7–15.5)
Lymphs Abs: 1574 cells/uL (ref 850–3900)
MCH: 27.6 pg (ref 27.0–33.0)
MCHC: 32.5 g/dL (ref 32.0–36.0)
MCV: 84.7 fL (ref 80.0–100.0)
MPV: 9.7 fL (ref 7.5–12.5)
Monocytes Relative: 9.6 %
Neutro Abs: 3861 cells/uL (ref 1500–7800)
Neutrophils Relative %: 63.3 %
Platelets: 350 10*3/uL (ref 140–400)
RBC: 4.5 10*6/uL (ref 3.80–5.10)
RDW: 14.5 % (ref 11.0–15.0)
Total Lymphocyte: 25.8 %
WBC: 6.1 10*3/uL (ref 3.8–10.8)

## 2019-04-07 LAB — COMPLETE METABOLIC PANEL WITH GFR
AG Ratio: 1.6 (calc) (ref 1.0–2.5)
ALT: 12 U/L (ref 6–29)
AST: 13 U/L (ref 10–35)
Albumin: 4.2 g/dL (ref 3.6–5.1)
Alkaline phosphatase (APISO): 150 U/L (ref 37–153)
BUN: 19 mg/dL (ref 7–25)
CO2: 30 mmol/L (ref 20–32)
Calcium: 9.2 mg/dL (ref 8.6–10.4)
Chloride: 104 mmol/L (ref 98–110)
Creat: 0.83 mg/dL (ref 0.50–1.05)
GFR, Est African American: 93 mL/min/{1.73_m2} (ref >=60)
GFR, Est Non African American: 81 mL/min/{1.73_m2} (ref >=60)
Globulin: 2.6 g/dL (calc) (ref 1.9–3.7)
Glucose, Bld: 90 mg/dL (ref 65–99)
Potassium: 4.3 mmol/L (ref 3.5–5.3)
Sodium: 140 mmol/L (ref 135–146)
Total Bilirubin: 0.4 mg/dL (ref 0.2–1.2)
Total Protein: 6.8 g/dL (ref 6.1–8.1)

## 2019-04-07 LAB — LIPID PANEL
Cholesterol: 148 mg/dL (ref <200)
HDL: 59 mg/dL (ref >=50)
LDL Cholesterol (Calc): 70 mg/dL (calc)
Non-HDL Cholesterol (Calc): 89 mg/dL (calc) (ref <130)
Total CHOL/HDL Ratio: 2.5 (calc) (ref <5.0)
Triglycerides: 100 mg/dL (ref <150)

## 2019-04-07 LAB — HEMOGLOBIN A1C
Hgb A1c MFr Bld: 5.7 % of total Hgb — ABNORMAL HIGH (ref <5.7)
Mean Plasma Glucose: 117 (calc)
eAG (mmol/L): 6.5 (calc)

## 2019-04-07 LAB — IRON, TOTAL/TOTAL IRON BINDING CAP
%SAT: 14 % (calc) — ABNORMAL LOW (ref 16–45)
Iron: 53 ug/dL (ref 45–160)
TIBC: 385 mcg/dL (calc) (ref 250–450)

## 2019-04-07 LAB — TSH: TSH: 0.42 mIU/L

## 2019-04-07 LAB — MAGNESIUM: Magnesium: 2.2 mg/dL (ref 1.5–2.5)

## 2019-04-15 ENCOUNTER — Other Ambulatory Visit: Payer: Self-pay | Admitting: Adult Health

## 2019-04-15 DIAGNOSIS — M797 Fibromyalgia: Secondary | ICD-10-CM

## 2019-04-16 ENCOUNTER — Other Ambulatory Visit: Payer: Self-pay | Admitting: Physician Assistant

## 2019-04-16 MED ORDER — LINACLOTIDE 290 MCG PO CAPS: 290.0000 ug | ORAL_CAPSULE | Freq: Every day | ORAL | 0 refills | Status: DC

## 2019-04-17 ENCOUNTER — Other Ambulatory Visit: Payer: Self-pay | Admitting: Physician Assistant

## 2019-04-20 ENCOUNTER — Other Ambulatory Visit: Payer: Self-pay | Admitting: *Deleted

## 2019-04-20 MED ORDER — LINACLOTIDE 290 MCG PO CAPS: 290.0000 ug | ORAL_CAPSULE | Freq: Every day | ORAL | 0 refills | Status: DC

## 2019-04-29 ENCOUNTER — Ambulatory Visit: Payer: BC Managed Care – PPO | Attending: Internal Medicine

## 2019-04-29 DIAGNOSIS — Z23 Encounter for immunization: Secondary | ICD-10-CM

## 2019-04-29 NOTE — Progress Notes (Signed)
   Covid-19 Vaccination Clinic  Name:  KURT AZIMI    MRN: [id number] DOB: 1965/04/23  04/29/2019  Ms. Skousen was observed post Covid-19 immunization for 15 minutes without incident. She was provided with Vaccine Information Sheet and instruction to access the V-Safe system.   Ms. Brazill was instructed to call 911 with any severe reactions post vaccine: Marland Kitchen Difficulty breathing  . Swelling of face and throat  . A fast heartbeat  . A bad rash all over body  . Dizziness and weakness   Immunizations Administered    Name Date Dose VIS Date Route   Pfizer COVID-19 Vaccine 04/29/2019  2:23 PM 0.3 mL 12/26/2018 Intramuscular   Manufacturer: [employer], [employer]   Lot: [id number]   NDC: [address]-2

## 2019-05-14 ENCOUNTER — Other Ambulatory Visit: Payer: Self-pay | Admitting: Physician Assistant

## 2019-05-14 DIAGNOSIS — M797 Fibromyalgia: Secondary | ICD-10-CM

## 2019-05-15 ENCOUNTER — Other Ambulatory Visit: Payer: Self-pay | Admitting: Adult Health

## 2019-05-15 DIAGNOSIS — M797 Fibromyalgia: Secondary | ICD-10-CM

## 2019-05-20 ENCOUNTER — Other Ambulatory Visit: Payer: Self-pay | Admitting: Physician Assistant

## 2019-05-21 ENCOUNTER — Other Ambulatory Visit: Payer: Self-pay | Admitting: Internal Medicine

## 2019-05-21 DIAGNOSIS — Z1231 Encounter for screening mammogram for malignant neoplasm of breast: Secondary | ICD-10-CM

## 2019-06-15 ENCOUNTER — Other Ambulatory Visit: Payer: Self-pay | Admitting: Physician Assistant

## 2019-06-18 ENCOUNTER — Other Ambulatory Visit: Payer: Self-pay | Admitting: Physician Assistant

## 2019-06-18 DIAGNOSIS — M797 Fibromyalgia: Secondary | ICD-10-CM

## 2019-06-18 MED ORDER — SAXENDA 18 MG/3ML ~~LOC~~ SOPN: PEN_INJECTOR | SUBCUTANEOUS | 0 refills | Status: DC

## 2019-07-15 ENCOUNTER — Other Ambulatory Visit: Payer: Self-pay | Admitting: Adult Health

## 2019-07-15 ENCOUNTER — Other Ambulatory Visit: Payer: Self-pay | Admitting: Physician Assistant

## 2019-07-24 ENCOUNTER — Other Ambulatory Visit: Payer: Self-pay | Admitting: Physician Assistant

## 2019-07-24 DIAGNOSIS — M797 Fibromyalgia: Secondary | ICD-10-CM

## 2019-08-14 ENCOUNTER — Other Ambulatory Visit: Payer: Self-pay | Admitting: Physician Assistant

## 2019-08-23 ENCOUNTER — Other Ambulatory Visit: Payer: Self-pay | Admitting: Physician Assistant

## 2019-08-23 DIAGNOSIS — M797 Fibromyalgia: Secondary | ICD-10-CM

## 2019-09-02 DIAGNOSIS — E785 Hyperlipidemia, unspecified: Secondary | ICD-10-CM | POA: Insufficient documentation

## 2019-09-02 NOTE — Progress Notes (Signed)
Complete Physical  Assessment and Plan:  Constipation, unspecified constipation type -     Plecanatide (TRULANCE) 3 MG TABS; Take 3 mg by mouth daily. -     Ambulatory referral to Physical Therapy Will try trulance, linzess causes diarrhea at times - disussed true constipation, does not need BM daily if having soft, good BM's Continue squatty potty, fiber, water  Dyspareunia due to medical condition in female -     Ambulatory referral to Physical Therapy  Encounter for hepatitis C screening test for low risk patient -     Hepatitis C antibody  Essential hypertension - continue medications, DASH diet, exercise and monitor at home. Call if greater than 130/80.  -     CBC with Differential/Platelet -     COMPLETE METABOLIC PANEL WITH GFR -     TSH -     Urinalysis, Routine w reflex microscopic -     Microalbumin / creatinine urine ratio  Fibromyalgia affecting multiple sites (Secondary Area of Pain)  Continue follow up pain management Consider acupuncture  Chronic pain syndrome Continue follow up pain management  Long term current use of opiate analgesic Continue follow up pain management  Long term prescription benzodiazepine use Continue follow up pain management  Tobacco abuse Discussed stopping, patient is working on it, declines medications  Anxiety state Continue meds  Other insomnia Insomnia- good sleep hygiene discussed, increase day time activity Continue medications  Prediabetes Discussed disease progression and risks Discussed diet/exercise, weight management and risk modification -     Hemoglobin A1c If in DM range will consider SGLT2 or GLP1  Medication management -     Magnesium  Vitamin D deficiency -     VITAMIN D 25 Hydroxy (Vit-D Deficiency, Fractures)  S/P gastric bypass -     Iron,Total/Total Iron Binding Cap -     Vitamin B12  BMI 35.0-35.9,adult - - follow up 3 months for progress monitoring Suggest follow up with weight loss  clinic with bariatric clinic Discussed meds contributing? Would benefit from topamax - long discussion about weight loss, diet, and exercise, will start the patient on phentermine- hand out given and AE's discussed, will do close follow up.   Mixed hyperlipidemia check lipids decrease fatty foods increase activity.  -     Lipid panel  Screening, anemia, deficiency, iron -     Iron,Total/Total Iron Binding Cap -     Vitamin B12   Discussed med's effects and SE's. Screening labs and tests as requested with regular follow-up as recommended. Over 40 minutes of exam, counseling, chart review, and complex, high level critical decision making was performed this visit.   HPI  54 y.o. female  presents for a complete physical.  She is working 2 week in the home and 2 in the office. Aunt, husband's sister, passed due to [place].   Her blood pressure has been controlled at home,  today their BP is BP: 120/82 She does workout, she is walking. She denies chest pain, shortness of breath, dizziness.   She does smoke, states very rare, 1-2 a day and somedays without any.  CXR 06/2017  She has chronic pain, DJD and follows with pain clinic, Dr. Lowella Dandy.  Had negative sleep study for organic causes, deemed chronic insomnia.   BMI is Body mass index is 32.87 kg/m., she is working on diet and exercise. s/p gastric bypass Sept 2013.  She is also on gabapentin 300 mg TID, seroquel 75 mg QHS, and cymbalta 78m. She is  doing well with saxenda but wishes to go up on dose which there is not a higher dose. She also states that has constipation from the medication, linzess does not help. She will occ have to push on her vaginal vault to help with stool, she has some dyspareunia.  Wt Readings from Last 3 Encounters:  09/03/19 213 lb (96.6 kg)  04/06/19 223 lb (101.2 kg)  12/04/18 243 lb (110.2 kg)    She is not on cholesterol medication and denies myalgias. Her cholesterol is at goal. The cholesterol last  visit was:   Lab Results  Component Value Date   CHOL 148 04/06/2019   HDL 59 04/06/2019   LDLCALC 70 04/06/2019   TRIG 100 04/06/2019   CHOLHDL 2.5 04/06/2019   She has been working on diet and exercise for prediabetes, and denies paresthesia of the feet, polydipsia, polyuria and visual disturbances. Last A1C in the office was:  Lab Results  Component Value Date   HGBA1C 5.7 (H) 04/06/2019   Patient is on Vitamin D supplement.   Lab Results  Component Value Date   VD25OH 44 12/04/2018     She has had elevated alk phos with normal Ab [place], GGT and bone scan in 2001. Has had negative autoimmune workup in the past.    Current Medications:  Current Outpatient Medications on File Prior to Visit  Medication Sig Dispense Refill  . Albuterol Sulfate (PROVENTIL HFA IN) Inhale 108 mcg into the lungs as needed.    . Aspirin-Acetaminophen-Caffeine (EXCEDRIN EXTRA STRENGTH PO) Take 1 tablet by mouth 3 (three) times daily as needed (headache).     . betamethasone dipropionate (DIPROLENE) 0.05 % ointment Apply topically 2 (two) times daily. 30 g 0  . cetirizine-pseudoephedrine (ZYRTEC-D) 5-120 MG tablet Take 1 tablet by mouth every 12 (twelve) hours as needed for allergies.     . diphenhydrAMINE (BENADRYL) 25 MG tablet Take 25-50 mg by mouth 3 (three) times daily as needed for allergies.    . diphenhydrAMINE-APAP, sleep, (HM ACETAMINOPHEN PM EX ST PO) Take by mouth.    . DULoxetine (CYMBALTA) 30 MG capsule TAKE ONE CAPSULE BY MOUTH DAILY WITH 60MG CAPSULE FOR A TOTAL OF 90MG PER DAY 90 capsule 0  . DULoxetine (CYMBALTA) 60 MG capsule TAKE ONE CAPSULE BY MOUTH DAILY. TAKE WITH 30MG CAPSULE FOR TOTAL OF 90MGS PER DAY 30 capsule 3  . ferrous sulfate 325 (65 FE) MG EC tablet Take 325 mg by mouth every other day.     . gabapentin (NEURONTIN) 300 MG capsule TAKE ONE TO TWO CAPSULES BY MOUTH UP TO THREE TIMES DAILY AS NEEDED FOR PAIN 180 capsule 0  . Glucosamine-Chondroitin (COSAMIN DS PO) Take 1,500 mg  by mouth daily.    . Liraglutide -Weight Management (SAXENDA) 18 MG/3ML SOPN INJECT 0.6 MG INTO THE SKIN FOR 1 WEEK THEN INCREASE BY 0.6 MG DAILY EVERY WEEK MAX DOSE IS 3 MG DAILY 15 mL 1  . lisinopril-hydrochlorothiazide (ZESTORETIC) 20-12.5 MG tablet TAKE ONE TABLET BY MOUTH DAILY 30 tablet 3  . Multiple Vitamins-Minerals (MULTIVITAMIN WITH MINERALS) tablet Take 1 tablet by mouth daily. .    . NOVOFINE 32G X 6 MM MISC Needs daily for victoza 100 each 5  . OVER THE COUNTER MEDICATION 540 mg.    . QUEtiapine (SEROQUEL) 25 MG tablet Take 3 tablets (75 mg total) by mouth at bedtime. 90 tablet 3  . Senna 30 MG MISC Take 30 mg by mouth daily.    . traMADol (  ULTRAM) 50 MG tablet Take 1 tablet (50 mg total) by mouth every 6 (six) hours as needed for severe pain. 120 tablet 5  . valACYclovir (VALTREX) 500 MG tablet TAKE ONE TABLET BY MOUTH DAILY 90 tablet 0  . VALERIAN ROOT PO Take 1,000 mg by mouth.    . Vitamin D, Ergocalciferol, (DRISDOL) 1.25 MG (50000 UNIT) CAPS capsule TAKE 1 CAPSULE BY MOUTH TWICE WEEKLY 8 capsule 3   No current facility-administered medications on file prior to visit.   Health Maintenance:   Immunization History  Administered Date(s) Administered  . Influenza Inj Mdck Quad With Preservative 10/23/2016  . Influenza Split 10/02/2011, 10/16/2013, 11/15/2014  . PFIZER SARS-COV-2 Vaccination 04/03/2019, 04/29/2019  . Pneumococcal Polysaccharide-23 10/02/2011  . Tdap 08/30/2016   Health Maintenance  Topic Date Due  . Hepatitis C Screening  Never done  . MAMMOGRAM  03/20/2018  . INFLUENZA VACCINE  08/16/2019  . PAP SMEAR-Modifier  08/31/2019  . COLONOSCOPY  05/14/2022  . TETANUS/TDAP  08/31/2026  . COVID-19 Vaccine  Completed  . HIV Screening  Completed    Tetanus: 2018 Pneumovax: 2013 Prevnar 13: N/A Flu vaccine: 2018 Zostavax: N/A  LMP: off depo Pap:08/2016 NEG HPV MGM:  03/2016 OVERDUE DEXA: 09/2013  Colonoscopy: 04/2017 normal EGD: 2013 Myoview stress  test 10/2010 Sleep study 05/2018  Last Dental Exam: Dr. Quincy Simmonds Last Eye Exam: Dr Randall Hiss DERM Dr. Raquel Sarna, OB/GYN Patient Care Team: Vicie Mutters, PA-C as PCP - General (Physician Assistant) Himmelrich, Bryson Ha, [place] (Inactive) as Dietitian (Bariatrics) Ladene Artist, MD as Consulting Physician (Gastroenterology)  Medical History:  Past Medical History:  Diagnosis Date  . Allergy   . Anemia   . Anxiety   . Arthritis   . Fibromyalgia   . GERD (gastroesophageal reflux disease)   . Hypertension   . IBS (irritable bowel syndrome)   . Nephrolithiasis   . Plantar fasciitis of right foot    wears boot    Allergies No Known Allergies  SURGICAL HISTORY She  has a past surgical history that includes Left shoulder arthroscopic surgery (yrs ago); tummy tuck (2005); Breath tek h pylori (07/11/2011); Gastric Roux-En-Y (10/01/2011); Cholecystectomy (10/01/2011); and Colonoscopy.   FAMILY HISTORY Her family history includes Breast cancer (age of onset: 41) in her mother; Breast cancer (age of onset: 54) in her maternal aunt; Cancer in her maternal grandmother; Cancer (age of onset: 53) in her mother; Heart attack in her maternal grandfather and paternal grandfather; Heart disease in her father and maternal grandfather; Hypertension in her father; Lung cancer in her maternal grandmother; Multiple sclerosis in her maternal uncle; Obesity in her brother; Stroke in her father.   SOCIAL HISTORY She  reports that she has been smoking cigarettes. She has a 1.75 pack-year smoking history. She has never used smokeless tobacco. She reports current alcohol use of about 1.0 standard drink of alcohol per week. She reports that she does not use drugs.   Review of Systems: Review of Systems  Constitutional: Positive for malaise/fatigue. Negative for chills, diaphoresis, fever and weight loss.  Eyes: Negative.   Respiratory: Negative.   Cardiovascular: Negative.   Gastrointestinal: Negative.    Genitourinary: Negative.   Musculoskeletal: Positive for back pain, joint pain, myalgias and neck pain. Negative for falls.  Skin: Negative.   Neurological: Negative.  Negative for weakness.  Psychiatric/Behavioral: Negative.     Physical Exam: Estimated body mass index is 32.87 kg/m as calculated from the following:   Height as of this encounter: 5'  7.5" (1.715 m).   Weight as of this encounter: 213 lb (96.6 kg). BP 120/82   Pulse 85   Temp (!) 97.5 F (36.4 C)   Ht 5' 7.5" (1.715 m)   Wt 213 lb (96.6 kg)   SpO2 96%   BMI 32.87 kg/m  General Appearance: Well nourished, in no apparent distress.  Eyes: PERRLA, EOMs, conjunctiva no swelling or erythema, normal fundi and vessels.  Sinuses: No Frontal/maxillary tenderness  ENT/Mouth: Ext aud canals clear, normal light reflex with TMs without erythema, bulging. Good dentition. No erythema, swelling, or exudate on post pharynx. Tonsils not swollen or erythematous. Hearing normal.  Neck: Supple, thyroid normal. No bruits  Respiratory: Respiratory effort normal, BS equal bilaterally without rales, rhonchi, wheezing or stridor.  Cardio: RRR without murmurs, rubs or gallops. Brisk peripheral pulses without edema.  Chest: symmetric, with normal excursions and percussion.  Breasts: defer  Abdomen: Soft, nontender, no guarding, rebound, hernias, masses, or organomegaly.  Lymphatics: Non tender without lymphadenopathy.  Genitourinary: defer Musculoskeletal: Full ROM all peripheral extremities,5/5 strength, and normal gait.  Skin: Warm, dry without rashes, lesions, ecchymosis. Neuro: Cranial nerves intact, reflexes equal bilaterally. Normal muscle tone, no cerebellar symptoms. Sensation intact.  Psych: Awake and oriented X 3, normal affect, Insight and Judgment appropriate.   EKG:  WNL AORTA SCAN: defer   Vicie Mutters 9:39 AM [facility]

## 2019-09-03 ENCOUNTER — Encounter: Payer: Self-pay | Admitting: Physician Assistant

## 2019-09-03 ENCOUNTER — Other Ambulatory Visit: Payer: Self-pay

## 2019-09-03 ENCOUNTER — Ambulatory Visit (INDEPENDENT_AMBULATORY_CARE_PROVIDER_SITE_OTHER): Payer: BC Managed Care – PPO | Admitting: Physician Assistant

## 2019-09-03 VITALS — BP 120/82 | HR 85 | Temp 97.5°F | Ht 67.5 in | Wt 213.0 lb

## 2019-09-03 DIAGNOSIS — Z136 Encounter for screening for cardiovascular disorders: Secondary | ICD-10-CM | POA: Diagnosis not present

## 2019-09-03 DIAGNOSIS — K5900 Constipation, unspecified: Secondary | ICD-10-CM

## 2019-09-03 DIAGNOSIS — I10 Essential (primary) hypertension: Secondary | ICD-10-CM

## 2019-09-03 DIAGNOSIS — Z0000 Encounter for general adult medical examination without abnormal findings: Secondary | ICD-10-CM | POA: Diagnosis not present

## 2019-09-03 DIAGNOSIS — Z1159 Encounter for screening for other viral diseases: Secondary | ICD-10-CM | POA: Diagnosis not present

## 2019-09-03 DIAGNOSIS — Z131 Encounter for screening for diabetes mellitus: Secondary | ICD-10-CM | POA: Diagnosis not present

## 2019-09-03 DIAGNOSIS — G894 Chronic pain syndrome: Secondary | ICD-10-CM

## 2019-09-03 DIAGNOSIS — Z9884 Bariatric surgery status: Secondary | ICD-10-CM

## 2019-09-03 DIAGNOSIS — G4701 Insomnia due to medical condition: Secondary | ICD-10-CM

## 2019-09-03 DIAGNOSIS — E559 Vitamin D deficiency, unspecified: Secondary | ICD-10-CM

## 2019-09-03 DIAGNOSIS — D509 Iron deficiency anemia, unspecified: Secondary | ICD-10-CM | POA: Diagnosis not present

## 2019-09-03 DIAGNOSIS — Z720 Tobacco use: Secondary | ICD-10-CM

## 2019-09-03 DIAGNOSIS — F411 Generalized anxiety disorder: Secondary | ICD-10-CM

## 2019-09-03 DIAGNOSIS — M797 Fibromyalgia: Secondary | ICD-10-CM

## 2019-09-03 DIAGNOSIS — Z0001 Encounter for general adult medical examination with abnormal findings: Secondary | ICD-10-CM

## 2019-09-03 DIAGNOSIS — Z13220 Encounter for screening for lipoid disorders: Secondary | ICD-10-CM | POA: Diagnosis not present

## 2019-09-03 DIAGNOSIS — Z1389 Encounter for screening for other disorder: Secondary | ICD-10-CM | POA: Diagnosis not present

## 2019-09-03 DIAGNOSIS — R7309 Other abnormal glucose: Secondary | ICD-10-CM

## 2019-09-03 DIAGNOSIS — N9419 Other specified dyspareunia: Secondary | ICD-10-CM

## 2019-09-03 DIAGNOSIS — E785 Hyperlipidemia, unspecified: Secondary | ICD-10-CM

## 2019-09-03 DIAGNOSIS — Z1329 Encounter for screening for other suspected endocrine disorder: Secondary | ICD-10-CM | POA: Diagnosis not present

## 2019-09-03 DIAGNOSIS — G8929 Other chronic pain: Secondary | ICD-10-CM

## 2019-09-03 DIAGNOSIS — Z79899 Other long term (current) drug therapy: Secondary | ICD-10-CM

## 2019-09-03 MED ORDER — TRULANCE 3 MG PO TABS: 3.0000 mg | ORAL_TABLET | Freq: Every day | ORAL | 3 refills | Status: DC

## 2019-09-03 MED ORDER — TRULANCE 3 MG PO TABS: 3.0000 mg | ORAL_TABLET | Freq: Every day | ORAL | 1 refills | Status: DC

## 2019-09-03 NOTE — Patient Instructions (Addendum)
HOW TO SCHEDULE A MAMMOGRAM  [facility]  7 a.m.-6:30 p.m., Monday 7 a.m.-5 p.m., Tuesday-Friday Schedule an appointment by calling [phone].   What is the TMJ? The temporomandibular (tem-PUH-ro-man-DIB-yoo-ler) joint, or the TMJ, connects the upper and lower jawbones. This joint allows the jaw to open wide and move back and forth when you chew, talk, or yawn.There are also several muscles that help this joint move. There can be muscle tightness and pain in the muscle that can cause several symptoms.  What causes TMJ pain? There are many causes of TMJ pain. Repeated chewing (for example, chewing gum) and clenching your teeth can cause pain in the joint. Some TMJ pain has no obvious cause. What can I do to ease the pain? There are many things you can do to help your pain get better. When you have pain:  Eat soft foods and stay away from chewy foods (for example, taffy) Try to use both sides of your mouth to chew Don't chew gum Massage Don't open your mouth wide (for example, during yawning or singing) Don't bite your cheeks or fingernails Lower your amount of stress and worry Applying a warm, damp washcloth to the joint may help. Over-the-counter pain medicines such as ibuprofen (one brand: Advil) or acetaminophen (one brand: Tylenol) might also help. Do not use these medicines if you are allergic to them or if your doctor told you not to use them. How can I stop the pain from coming back? When your pain is better, you can do these exercises to make your muscles stronger and to keep the pain from coming back:  Resisted mouth opening: Place your thumb or two fingers under your chin and open your mouth slowly, pushing up lightly on your chin with your thumb. Hold for three to six seconds. Close your mouth slowly. Resisted mouth closing: Place your thumbs under your chin and your two index fingers on the ridge between your mouth and the bottom of your chin.  Push down lightly on your chin as you close your mouth. Tongue up: Slowly open and close your mouth while keeping the tongue touching the roof of the mouth. Side-to-side jaw movement: Place an object about one fourth of an inch thick (for example, two tongue depressors) between your front teeth. Slowly move your jaw from side to side. Increase the thickness of the object as the exercise becomes easier Forward jaw movement: Place an object about one fourth of an inch thick between your front teeth and move the bottom jaw forward so that the bottom teeth are in front of the top teeth. Increase the thickness of the object as the exercise becomes easier. These exercises should not be painful. If it hurts to do these exercises, stop doing them and talk to your family doctor.   General eating tips  What to Avoid . Avoid added sugars o Often added sugar can be found in processed foods such as many condiments, dry cereals, cakes, cookies, chips, crisps, crackers, candies, sweetened drinks, etc.  o Read labels and AVOID/DECREASE use of foods with the following in their ingredient list: Sugar, fructose, high fructose corn syrup, sucrose, glucose, maltose, dextrose, molasses, cane sugar, brown sugar, any type of syrup, agave nectar, etc.   . Avoid snacking in between meals- drink water or if you feel you need a snack, pick a high water content snack such as cucumbers, watermelon, or any veggie.  Marland Kitchen Avoid foods made with flour o If you  are going to eat food made with flour, choose those made with whole-grains; and, minimize your consumption as much as is tolerable . Avoid processed foods o These foods are generally stocked in the middle of the grocery store.  o Focus on shopping on the perimeter of the grocery.  What to Include . Vegetables o GREEN LEAFY VEGETABLES: Kale, spinach, mustard greens, collard greens, cabbage, broccoli, etc. o OTHER: Asparagus, cauliflower, eggplant, carrots, peas, [place]  sprouts, tomatoes, bell peppers, zucchini, beets, cucumbers, etc. . Grains, seeds, and legumes o Beans: kidney beans, black eyed peas, garbanzo beans, black beans, pinto beans, etc. o Whole, unrefined grains: brown rice, barley, bulgur, oatmeal, etc. . Healthy fats  o Avoid highly processed fats such as vegetable oil o Examples of healthy fats: avocado, olives, virgin olive oil, dark chocolate (?72% Cocoa), nuts (peanuts, almonds, walnuts, cashews, pecans, etc.) o Please still do small amount of these healthy fats, they are dense in calories.  . Low - Moderate Intake of Animal Sources of Protein o Meat sources: chicken, [place], salmon, tuna. Limit to 4 ounces of meat at one time or the size of your palm. o Consider limiting dairy sources, but when choosing dairy focus on: PLAIN [place] yogurt, cottage cheese, high-protein milk . Fruit o Choose berries

## 2019-09-04 LAB — CBC WITH DIFFERENTIAL/PLATELET
Absolute Monocytes: 562 cells/uL (ref 200–950)
Basophils Absolute: 31 cells/uL (ref 0–200)
Basophils Relative: 0.6 %
Eosinophils Absolute: 78 cells/uL (ref 15–500)
Eosinophils Relative: 1.5 %
HCT: 39 % (ref 35.0–45.0)
Hemoglobin: 12.7 g/dL (ref 11.7–15.5)
Lymphs Abs: 1659 cells/uL (ref 850–3900)
MCH: 27.9 pg (ref 27.0–33.0)
MCHC: 32.6 g/dL (ref 32.0–36.0)
MCV: 85.5 fL (ref 80.0–100.0)
MPV: 9.8 fL (ref 7.5–12.5)
Monocytes Relative: 10.8 %
Neutro Abs: 2870 cells/uL (ref 1500–7800)
Neutrophils Relative %: 55.2 %
Platelets: 339 10*3/uL (ref 140–400)
RBC: 4.56 10*6/uL (ref 3.80–5.10)
RDW: 14.7 % (ref 11.0–15.0)
Total Lymphocyte: 31.9 %
WBC: 5.2 10*3/uL (ref 3.8–10.8)

## 2019-09-04 LAB — VITAMIN B12: Vitamin B-12: 559 pg/mL (ref 200–1100)

## 2019-09-04 LAB — COMPLETE METABOLIC PANEL WITH GFR
AG Ratio: 1.7 (calc) (ref 1.0–2.5)
ALT: 14 U/L (ref 6–29)
AST: 16 U/L (ref 10–35)
Albumin: 4.4 g/dL (ref 3.6–5.1)
Alkaline phosphatase (APISO): 168 U/L — ABNORMAL HIGH (ref 37–153)
BUN: 13 mg/dL (ref 7–25)
CO2: 26 mmol/L (ref 20–32)
Calcium: 9.7 mg/dL (ref 8.6–10.4)
Chloride: 103 mmol/L (ref 98–110)
Creat: 0.69 mg/dL (ref 0.50–1.05)
GFR, Est African American: 115 mL/min/{1.73_m2} (ref >=60)
GFR, Est Non African American: 99 mL/min/{1.73_m2} (ref >=60)
Globulin: 2.6 g/dL (calc) (ref 1.9–3.7)
Glucose, Bld: 85 mg/dL (ref 65–99)
Potassium: 4.2 mmol/L (ref 3.5–5.3)
Sodium: 138 mmol/L (ref 135–146)
Total Bilirubin: 0.4 mg/dL (ref 0.2–1.2)
Total Protein: 7 g/dL (ref 6.1–8.1)

## 2019-09-04 LAB — IRON, TOTAL/TOTAL IRON BINDING CAP
%SAT: 19 % (calc) (ref 16–45)
Iron: 82 ug/dL (ref 45–160)
TIBC: 430 mcg/dL (calc) (ref 250–450)

## 2019-09-04 LAB — URINALYSIS, ROUTINE W REFLEX MICROSCOPIC
Bilirubin Urine: NEGATIVE
Glucose, UA: NEGATIVE
Hgb urine dipstick: NEGATIVE
Ketones, ur: NEGATIVE
Leukocytes,Ua: NEGATIVE
Nitrite: NEGATIVE
Protein, ur: NEGATIVE
Specific Gravity, Urine: 1.014 (ref 1.001–1.03)
pH: 5.5 (ref 5.0–8.0)

## 2019-09-04 LAB — TSH: TSH: 0.94 mIU/L

## 2019-09-04 LAB — HEMOGLOBIN A1C
Hgb A1c MFr Bld: 5.7 % of total Hgb — ABNORMAL HIGH (ref <5.7)
Mean Plasma Glucose: 117 (calc)
eAG (mmol/L): 6.5 (calc)

## 2019-09-04 LAB — HEPATITIS C ANTIBODY
Hepatitis C Ab: NONREACTIVE
SIGNAL TO CUT-OFF: 0.01 (ref <1.00)

## 2019-09-04 LAB — LIPID PANEL
Cholesterol: 141 mg/dL (ref <200)
HDL: 67 mg/dL (ref >=50)
LDL Cholesterol (Calc): 58 mg/dL (calc)
Non-HDL Cholesterol (Calc): 74 mg/dL (calc) (ref <130)
Total CHOL/HDL Ratio: 2.1 (calc) (ref <5.0)
Triglycerides: 82 mg/dL (ref <150)

## 2019-09-04 LAB — MICROALBUMIN / CREATININE URINE RATIO
Creatinine, Urine: 82 mg/dL (ref 20–275)
Microalb, Ur: <0.2 mg/dL

## 2019-09-04 LAB — MAGNESIUM: Magnesium: 2.4 mg/dL (ref 1.5–2.5)

## 2019-09-04 LAB — VITAMIN D 25 HYDROXY (VIT D DEFICIENCY, FRACTURES): Vit D, 25-Hydroxy: 52 ng/mL (ref 30–100)

## 2019-09-04 LAB — FERRITIN: Ferritin: 6 ng/mL — ABNORMAL LOW (ref 16–232)

## 2019-09-09 ENCOUNTER — Telehealth: Payer: Self-pay | Admitting: *Deleted

## 2019-09-09 ENCOUNTER — Encounter: Payer: Self-pay | Admitting: Pain Medicine

## 2019-09-09 NOTE — Telephone Encounter (Signed)
Attempted to call for pre appointment review of allergies/meds. Patient at work, unable to do this at this time. Will call again later.

## 2019-09-10 ENCOUNTER — Ambulatory Visit: Payer: BC Managed Care – PPO | Attending: Pain Medicine | Admitting: Pain Medicine

## 2019-09-10 ENCOUNTER — Other Ambulatory Visit: Payer: Self-pay

## 2019-09-10 DIAGNOSIS — M797 Fibromyalgia: Secondary | ICD-10-CM

## 2019-09-10 DIAGNOSIS — Z79899 Other long term (current) drug therapy: Secondary | ICD-10-CM | POA: Insufficient documentation

## 2019-09-10 DIAGNOSIS — F1120 Opioid dependence, uncomplicated: Secondary | ICD-10-CM

## 2019-09-10 DIAGNOSIS — Z1379 Encounter for other screening for genetic and chromosomal anomalies: Secondary | ICD-10-CM | POA: Insufficient documentation

## 2019-09-10 DIAGNOSIS — R52 Pain, unspecified: Secondary | ICD-10-CM | POA: Diagnosis not present

## 2019-09-10 DIAGNOSIS — M25512 Pain in left shoulder: Secondary | ICD-10-CM

## 2019-09-10 DIAGNOSIS — G8929 Other chronic pain: Secondary | ICD-10-CM

## 2019-09-10 DIAGNOSIS — G894 Chronic pain syndrome: Secondary | ICD-10-CM

## 2019-09-10 DIAGNOSIS — M25511 Pain in right shoulder: Secondary | ICD-10-CM | POA: Diagnosis not present

## 2019-09-10 DIAGNOSIS — M25552 Pain in left hip: Secondary | ICD-10-CM

## 2019-09-10 DIAGNOSIS — M25551 Pain in right hip: Secondary | ICD-10-CM

## 2019-09-10 MED ORDER — TRAMADOL HCL 50 MG PO TABS: 50.0000 mg | ORAL_TABLET | Freq: Four times a day (QID) | ORAL | 0 refills | Status: DC | PRN

## 2019-09-10 NOTE — Progress Notes (Signed)
Patient: Wendy Valencia  Service Category: E/M  Provider: Gaspar Cola, MD  DOB: December 16, 1965  DOS: 09/10/2019  Location: Office  MRN: [id number]  Setting: Ambulatory outpatient  Referring Provider: Vicie Mutters, PA-C  Type: Established Patient  Specialty: Interventional Pain Management  PCP: Wendy Mutters, PA-C  Location: Remote location  Delivery: TeleHealth     Virtual Encounter - Pain Management PROVIDER NOTE: Information contained herein reflects review and annotations entered in association with encounter. Interpretation of such information and data should be left to medically-trained personnel. Information provided to patient can be located elsewhere in the medical record under "Patient Instructions". Document created using STT-dictation technology, any transcriptional errors that may result from process are unintentional.    Contact & Pharmacy Preferred: [phone] Home: [phone] (home) Mobile: [phone] (mobile) E-mail: [email or web address]  Wendy Valencia [facility] [address] [place] - [place] [address] Phone: [phone] Fax: [phone]  [address], [place] - [place] [address] Phone: [phone] Fax: [phone]  [id number]: [phone]: [phone]  Pre-screening  Wendy Valencia offered "in-person" vs "virtual" encounter. She indicated preferring virtual for this encounter.   Reason COVID-19*  Social distancing based on CDC and AMA recommendations.   I contacted Wendy Valencia on 09/10/2019 via telephone.      I clearly identified myself as FGaspar Cola MD. I verified that I was speaking with the correct person using two identifiers (Name: Wendy Valencia, and date of birth: 3February 24, 1967.  Consent I  sought verbal advanced consent from Wendy Valencia virtual visit interactions. I informed Wendy Valencia of possible security and privacy concerns, risks, and limitations associated with providing "not-in-person" medical evaluation and management services. I also informed Wendy Valencia of the availability of "in-person" appointments. Finally, I informed her that there would be a charge for the virtual visit and that she could be  personally, fully or partially, financially responsible for it. Ms. NTenpennyexpressed understanding and agreed to proceed.   Historic Elements   Wendy Valencia is a 54y.o. year old, female patient evaluated today after her last contact with our practice on 09/09/2019. Wendy Valencia has a past medical history of Allergy, Anemia, Anxiety, Arthritis, Fibromyalgia, GERD (gastroesophageal reflux disease), Hypertension, IBS (irritable bowel syndrome), Nephrolithiasis, and Plantar fasciitis of right foot. She also  has a past surgical history that includes Left shoulder arthroscopic surgery (yrs ago); tummy tuck (2005); Breath tek h pylori (07/11/2011); Gastric Roux-En-Y (10/01/2011); Cholecystectomy (10/01/2011); and Colonoscopy. Wendy Valencia a current medication list which includes the following prescription(s): albuterol sulfate, aspirin-acetaminophen-caffeine, betamethasone dipropionate, cetirizine-pseudoephedrine, diphenhydramine, duloxetine, duloxetine, ferrous sulfate, gabapentin, glucosamine-chondroitin, saxenda, lisinopril-hydrochlorothiazide, multivitamin with minerals, novofine, OVER THE COUNTER MEDICATION, trulance, quetiapine, senna, [START ON 09/17/2019] tramadol, valacyclovir, valerian, and vitamin d (ergocalciferol). She  reports that she has been smoking cigarettes. She has a 1.75 pack-year smoking history. She has never used smokeless tobacco. She reports current alcohol use of about 1.0 standard drink of alcohol per week. She reports that she does  not use drugs. Ms. NHershkowitzhas No Known Allergies.   HPI  Today, she is being contacted for medication management. The patient indicates doing well with the current medication regimen. No adverse reactions or side effects reported to the medications.   Pharmacotherapy Assessment  Analgesic: Tramadol 50 mg 2 tablets twice daily (200 mg/day of tramadol) MME/day:30m/day.   Monitoring: [place] PMP: PDMP reviewed during this encounter.       Pharmacotherapy: No side-effects or adverse reactions reported. Compliance: No problems identified. Effectiveness: Clinically acceptable. Plan: Refer to "POC".  UDS:  Summary  Date Value Ref Range Status  12/26/2017 FINAL  Final    Comment:    ==================================================================== TOXASSURE SELECT 13 (MW) ==================================================================== Test                             Result       Flag       Units Drug Present and Declared for Prescription Verification   Tramadol                       >8197        EXPECTED   ng/mg creat   O-Desmethyltramadol            >8197        EXPECTED   ng/mg creat   N-Desmethyltramadol            6198         EXPECTED   ng/mg creat    Source of tramadol is a prescription medication.    O-desmethyltramadol and N-desmethyltramadol are expected    metabolites of tramadol. Drug Present not Declared for Prescription Verification   Lorazepam                      457          UNEXPECTED ng/mg creat    Source of lorazepam is a scheduled prescription medication. ==================================================================== Test                      Result    Flag   Units      Ref Range   Creatinine              61               mg/dL      >=20 ==================================================================== Declared Medications:  The flagging and interpretation on this report are based on the  following declared medications.  Unexpected results may  arise from  inaccuracies in the declared medications.  **Note: The testing scope of this panel includes these medications:  Tramadol (Ultram)  **Note: The testing scope of this panel does not include following  reported medications:  Acetaminophen  Albuterol  Aspirin  Benzonatate (Tessalon)  Betamethasone (Diprolene)  Caffeine  Cetirizine (Zyrtec D)  Chondroitin (Glucosamine-Chondroitin)  Dextromethorphan  Diphenhydramine (Benadryl)  Doxepin (Sinequan)  Duloxetine (Cymbalta)  Eye Drops  Gabapentin (Neurontin)  Glucosamine (Glucosamine-Chondroitin)  Hydrochlorothiazide (Lisinopril-HCTZ)  Iron (Ferrous Sulfate)  Lisinopril (Lisinopril-HCTZ)  Magnesium  Medroxyprogesterone (Provera)  Meloxicam (Mobic)  Metoclopramide (Reglan)  Multivitamin (MVI)  Naproxen (Naprosyn)  Phentermine (Adipex-P)  Promethazine  Pseudoephedrine (Zyrtec D)  Supplement  Topical  Valacyclovir (Valtrex)  Vitamin D2 (Drisdol) ==================================================================== For clinical consultation, please call [phone] ====================================================================     Laboratory Chemistry Profile   Renal Lab Results  Component Value Date   BUN 13 09/03/2019   CREATININE 0.69 09/03/2019   LABCREA 82 056/81/2751  BCR NOT APPLICABLE 070/01/7492  GFRAA 115 09/03/2019   GFRNONAA 99 09/03/2019     Hepatic Lab Results  Component Value Date   AST 16 09/03/2019   ALT 14 09/03/2019  ALBUMIN 4.5 03/05/2016   ALKPHOS 123 (H) 03/05/2016     Electrolytes Lab Results  Component Value Date   NA 138 09/03/2019   K 4.2 09/03/2019   CL 103 09/03/2019   CALCIUM 9.7 09/03/2019   MG 2.4 09/03/2019     Bone Lab Results  Component Value Date   VD25OH 52 09/03/2019     Inflammation (CRP: Acute Phase) (ESR: Chronic Phase) Lab Results  Component Value Date   CRP 0.9 03/04/2017   ESRSEDRATE 22 (H) 03/04/2017       Note: Above Lab results  reviewed.  Imaging  Nocturnal polysomnography with REM behavior disorder Dohmeier, Asencion Partridge, MD     06/23/2018  8:36 AM PATIENT'S NAME:  Leotis Shames DOB:      06/14/2017      MR#:    [id number]     DATE OF RECORDING: 06/13/2018  Farrel Demark REFERRING M.D.:  Wendy Mutters PA-C Study Performed:   Baseline Polysomnogram HISTORY:  --- Sleep and medical history: had a sleep study in  [place], [place] about 10 years ago.  Carries a diagnosis of  morbid obesity with comorbidities, prediabetes,  hypercholesterolemia, hypertension, tobacco abuse, vitamin D  deficiency, she most recently wished to start Cymbalta for the  treatment of fibromyalgia.  She also has a history of irritable bowel syndrome,  gastroesophageal reflux syndrome, arthritis, anemia, allergies,  kidney stones, and plantar fasciitis. The patient has very long  standing Insomnia and carries a diagnosis of Fibromyalgia. Pain  treatment is with Dr. Leeanne Mannan. He treats for hip bursitis. Interestingly, the patient was prescribed phentermine for weight  loss (which can cause insomnia) and was taken off in mid -March  2020 after she lost zero weight since the last visit to primary  care office. She had confessed to stress eating grazing during  the day.  Body mass index is a 38 kg/m2. Hemoglobin A1c was 5.9,  she is pre-diabetic.  She states her total sleep time with medication and only then  amounts to 4 hours.  She does not suffer from excessive daytime  sleepiness and she does not take naps even if she would try to  nap she could not fall asleep.  The patient endorsed the Epworth Sleepiness Scale at 0 points.  The patient's weight 247 pounds with a height of 68 (inches),  resulting in a BMI of 37.4 kg/m2.The patient's neck circumference  measured 17 inches.  CURRENT MEDICATIONS: none listed by tech   PROCEDURE:  This is a multichannel digital polysomnogram  utilizing the Somnostar 11.2 system.  Electrodes and sensors were   applied and monitored per AASM Specifications.   EEG, EOG, Chin  and Limb EMG, were sampled at 200 Hz.  ECG, Snore and Nasal  Pressure, Thermal Airflow, Respiratory Effort, CPAP Flow and  Pressure, Oximetry was sampled at 50 Hz. Digital video and audio  were recorded.      BASELINE STUDY: Lights Out was at 22:41 and Lights On at 04:59.   Total recording time (TRT) was 379 minutes, with a total sleep  time (TST) of 352.5 minutes.   The patient's sleep latency was  15.5 minutes.  REM latency was 98.5 minutes.  The sleep  efficiency was 93.0%.     SLEEP ARCHITECTURE: WASO (Wake after sleep onset) was 23.5  minutes.  There were 6.5 minutes in Stage N1, 276 minutes Stage  N2, 43.5 minutes Stage N3 and 26.5 minutes in Stage REM.  The  percentage of Stage N1 was 1.8%,  Stage N2 was 78.3%, Stage N3 was  12.3% and Stage R (REM sleep) was 7.5%.   RESPIRATORY ANALYSIS:  There were a total of 17 respiratory  events:  0 obstructive apneas, 0 central apneas and 0 mixed  apneas with a total of 0 apneas and an apnea index (AI) of 0  /hour. There were 17 hypopneas with a hypopnea index of 2.9  /hour. The patient also had 0 respiratory event related arousals  (RERAs).     The total APNEA/HYPOPNEA INDEX (AHI) was 2.9 /hour and the total  RESPIRATORY DISTURBANCE INDEX was 0. 2.9 /hour.  0 events  occurred in REM sleep and 34 events in NREM. The REM AHI was 0. 0  /hour, versus a non-REM AHI of 3.1. The patient spent 278 minutes  of total sleep time in the supine position and 75 minutes in  non-supine. The supine AHI was 2.2 versus a non-supine AHI of  5.6.  OXYGEN SATURATION & C02:  The Wake baseline 02 saturation was  96%, with the lowest being 81%. Time spent below 89% saturation  equaled 12 minutes. The arousals were noted as: 61 were spontaneous, 0 were  associated with PLMs, and 11 were associated with respiratory  events. The patient had a total of 0 Periodic Limb Movements.   The Periodic  Limb Movement (PLM) index was 0 and the PLM Arousal  index was 0/hour.  Audio and video analysis did not show any abnormal or unusual  movements, behaviors, phonations or vocalizations. There were  several very brief REM sleep phases before 3.45 AM, and at that  time REM sleep reached almost 28 minutes.  Sleep was not very  fragmented and overall sleep time was excellent.  The patient didn't have bathroom breaks. She slept for 4 hours  supine and only slept on her side for the very last hours. Sleep  efficiency was 93%! Mild Snoring was noted. EKG was in keeping with normal sinus  rhythm (NSR). Post-study, the patient indicated that sleep was poor, restless  and interrupted.   IMPRESSION:  1. No evidence of Obstructive Sleep Apnea (OSA) - No PLMs, and no  Insomnia- total sleep time was close to 6 hours.   2. Normal EKG.  RECOMMENDATIONS:  1. No need for CPAP titration or other interventional therapy.   There was no organic sleep disorder identified.   I certify that I have reviewed the entire raw data recording  prior to the issuance of this report in accordance with the  Standards of Accreditation of the American Academy of Sleep  Medicine (AASM)  Larey Seat, MD    06-23-2018 Diplomat, American Board of Psychiatry and Neurology  Diplomat, American Board of Sleep Medicine Medical Director, [facility], [place]  Assessment  The primary encounter diagnosis was Chronic pain syndrome. Diagnoses of Chronic generalized pain (Primary Area of Pain), Fibromyalgia affecting multiple sites (Secondary Area of Pain), Chronic shoulder pain (Tertiary Area of Pain) (Bilateral) (R>L), Chronic hip pain (Fourth Area of Pain) (Bilateral) (R>L), Pharmacologic therapy, and Uncomplicated opioid dependence (HCC) were also pertinent to this visit.  Plan of Care  Problem-specific:  No problem-specific Assessment & Plan notes found for this encounter.  Wendy Valencia has a current medication list which includes the following long-term medication(s): duloxetine, duloxetine, gabapentin, lisinopril-hydrochlorothiazide, quetiapine, and [START ON 09/17/2019] tramadol.  Pharmacotherapy (Medications Ordered): Meds ordered this encounter  Medications  . traMADol (ULTRAM) 50 MG tablet    Sig:  Take 1 tablet (50 mg total) by mouth every 6 (six) hours as needed for severe pain.    Dispense:  120 tablet    Refill:  0    Chronic Pain: STOP Act (Not applicable) Fill 1 day early if closed on refill date. Do not fill until: 09/17/2019. To last until: 10/17/2019. Avoid benzodiazepines within 8 hours of opioids   Orders:  Orders Placed This Encounter  Procedures  . ToxASSURE Select 13 (MW), Urine    Volume: 30 ml(s). Minimum 3 ml of urine is needed. Document temperature of fresh sample. Indications: Long term (current) use of opiate analgesic (V25.366)    Order Specific Question:   Release to patient    Answer:   Immediate   Follow-up plan:   Return in about 1 month (around 10/14/2019) for (20-min), (F2F), (Med Mgmt) to review UDS.      Interventional options: Considering: Diagnostic bilateral intra-articular shoulder joint injection Diagnostic bilateral suprascapular nerve block Possible bilateral suprascapular nerve RFA Diagnostic bilateral intra-articular hip joint injection Diagnostic/therapeutic bilateral trochanteric bursa injection Diagnostic bilateral femoral nerve +obturator nerve block Possible bilateral femoral nerve + obturator nerve RFA Diagnostic/therapeuticLidocaine infusion Diagnostic/therapeuticTrigger point injection Diagnostic bilateral cervical facet block Diagnostic bilateral cervical facet RFA   Palliative PRN treatment(s): Diagnostic bilateralintra-articularhipjoint injection#2+ bilateral trochanteric bursa injection #2, under fluoroscopic guidance and IV sedation      Recent Visits No visits were found  meeting these conditions. Showing recent visits within past 90 days and meeting all other requirements Today's Visits Date Type Provider Dept  09/10/19 Telemedicine Milinda Pointer, MD [facility]  Showing today's visits and meeting all other requirements Future Appointments No visits were found meeting these conditions. Showing future appointments within next 90 days and meeting all other requirements  I discussed the assessment and treatment plan with the patient. The patient was provided an opportunity to ask questions and all were answered. The patient agreed with the plan and demonstrated an understanding of the instructions.  Patient advised to call back or seek an in-person evaluation if the symptoms or condition worsens.  Duration of encounter: 15 minutes.  Note by: Gaspar Cola, MD Date: 09/10/2019; Time: 4:23 PM

## 2019-09-13 ENCOUNTER — Other Ambulatory Visit: Payer: Self-pay | Admitting: Physician Assistant

## 2019-09-13 DIAGNOSIS — M797 Fibromyalgia: Secondary | ICD-10-CM

## 2019-09-16 ENCOUNTER — Telehealth: Payer: BC Managed Care – PPO | Admitting: Pain Medicine

## 2019-09-17 ENCOUNTER — Other Ambulatory Visit: Payer: Self-pay | Admitting: Physician Assistant

## 2019-09-18 ENCOUNTER — Other Ambulatory Visit: Payer: Self-pay | Admitting: Adult Health

## 2019-09-25 ENCOUNTER — Other Ambulatory Visit: Payer: Self-pay | Admitting: Adult Health

## 2019-09-25 ENCOUNTER — Other Ambulatory Visit: Payer: Self-pay

## 2019-09-25 DIAGNOSIS — M797 Fibromyalgia: Secondary | ICD-10-CM

## 2019-09-25 MED ORDER — BETAMETHASONE DIPROPIONATE 0.05 % EX OINT: TOPICAL_OINTMENT | Freq: Two times a day (BID) | CUTANEOUS | 0 refills | Status: DC

## 2019-09-25 MED ORDER — GABAPENTIN 300 MG PO CAPS: ORAL_CAPSULE | ORAL | 2 refills | Status: DC

## 2019-10-11 ENCOUNTER — Other Ambulatory Visit: Payer: Self-pay | Admitting: Adult Health

## 2019-10-12 ENCOUNTER — Other Ambulatory Visit: Payer: Self-pay | Admitting: Physician Assistant

## 2019-10-16 ENCOUNTER — Other Ambulatory Visit: Payer: Self-pay | Admitting: Pain Medicine

## 2019-10-16 DIAGNOSIS — G894 Chronic pain syndrome: Secondary | ICD-10-CM

## 2019-10-20 ENCOUNTER — Other Ambulatory Visit: Payer: Self-pay | Admitting: Physician Assistant

## 2019-10-21 ENCOUNTER — Ambulatory Visit: Payer: BC Managed Care – PPO | Admitting: Physical Therapy

## 2019-10-21 ENCOUNTER — Telehealth: Payer: Self-pay

## 2019-10-21 NOTE — Telephone Encounter (Signed)
Attempted to call patient for pre virtual visit appointment question.  LM

## 2019-10-22 ENCOUNTER — Other Ambulatory Visit: Payer: Self-pay

## 2019-10-22 ENCOUNTER — Ambulatory Visit: Payer: BC Managed Care – PPO | Attending: Pain Medicine | Admitting: Pain Medicine

## 2019-10-22 DIAGNOSIS — Z79899 Other long term (current) drug therapy: Secondary | ICD-10-CM

## 2019-10-22 DIAGNOSIS — M797 Fibromyalgia: Secondary | ICD-10-CM

## 2019-10-22 DIAGNOSIS — M25512 Pain in left shoulder: Secondary | ICD-10-CM

## 2019-10-22 DIAGNOSIS — M25551 Pain in right hip: Secondary | ICD-10-CM

## 2019-10-22 DIAGNOSIS — G894 Chronic pain syndrome: Secondary | ICD-10-CM

## 2019-10-22 DIAGNOSIS — M25552 Pain in left hip: Secondary | ICD-10-CM

## 2019-10-22 DIAGNOSIS — G8929 Other chronic pain: Secondary | ICD-10-CM

## 2019-10-22 DIAGNOSIS — M25511 Pain in right shoulder: Secondary | ICD-10-CM | POA: Diagnosis not present

## 2019-10-22 DIAGNOSIS — R52 Pain, unspecified: Secondary | ICD-10-CM

## 2019-10-22 MED ORDER — TRAMADOL HCL 50 MG PO TABS: 50.0000 mg | ORAL_TABLET | Freq: Four times a day (QID) | ORAL | 0 refills | Status: DC | PRN

## 2019-10-22 NOTE — Progress Notes (Signed)
Patient: Wendy Valencia  Service Category: E/M  Provider: Gaspar Cola, MD  DOB: [birth year]  DOS: 10/22/2019  Location: Office  MRN: [id number]  Setting: Ambulatory outpatient  Referring Provider: Vicie Mutters, PA-C  Type: Established Patient  Specialty: Interventional Pain Management  PCP: Wendy Mutters, PA-C  Location: Remote location  Delivery: TeleHealth     Virtual Encounter - Pain Management PROVIDER NOTE: Information contained herein reflects review and annotations entered in association with encounter. Interpretation of such information and data should be left to medically-trained personnel. Information provided to patient can be located elsewhere in the medical record under "Patient Instructions". Document created using STT-dictation technology, any transcriptional errors that may result from process are unintentional.    Contact & Pharmacy Preferred: [phone] Home: [phone] (home) Mobile: [phone] (mobile) E-mail: [email or web address]  Wendy [facility] [address] [place] - [place] [address] Phone: [phone] Fax: [phone]  [place], [place] - [address] [place] [address] Phone: [phone] Fax: [phone]  [id number]: [phone]: [phone]  Pre-screening  Wendy Valencia offered "in-person" vs "virtual" encounter. She indicated preferring virtual for this encounter.   Reason COVID-19*   Social distancing based on CDC and AMA recommendations.   I contacted LKenleeA Valencia on 10/22/2019 via telephone.      I clearly identified myself as FGaspar Cola MD. I verified that I was speaking with the correct person using two identifiers (Name: LCathyrn Valencia, and date of birth: 3Nov 25, 1967.  Consent I  sought verbal advanced consent from LWoodstonfor virtual visit interactions. I informed Wendy Valencia of possible security and privacy concerns, risks, and limitations associated with providing "not-in-person" medical evaluation and management services. I also informed Wendy Valencia of the availability of "in-person" appointments. Finally, I informed her that there would be a charge for the virtual visit and that she could be  personally, fully or partially, financially responsible for it. Ms. NCisseexpressed understanding and agreed to proceed.   Historic Elements   [name] is a 54y.o. year old, female patient evaluated today after our last contact on 10/16/2019. [name] has a past medical history of Allergy, Anemia, Anxiety, Arthritis, Fibromyalgia, GERD (gastroesophageal reflux disease), Hypertension, IBS (irritable bowel syndrome), Nephrolithiasis, and Plantar fasciitis of right foot. She also  has a past surgical history that includes Left shoulder arthroscopic surgery (yrs ago); tummy tuck (2005); Breath tek h pylori (07/11/2011); Gastric Roux-En-Y (10/01/2011); Cholecystectomy (10/01/2011); and Colonoscopy. Ms. NMotterhas a current medication list which includes the following prescription(s): albuterol sulfate, aspirin-acetaminophen-caffeine, betamethasone dipropionate, cetirizine-pseudoephedrine, diphenhydramine, duloxetine, duloxetine, ferrous sulfate, gabapentin, glucosamine-chondroitin, saxenda, lisinopril-hydrochlorothiazide, multivitamin with minerals, novofine, OVER THE COUNTER MEDICATION, quetiapine, senna, tramadol, valacyclovir, valerian, vitamin d (ergocalciferol), and trulance. She  reports that she has been smoking cigarettes. She has a 1.75 pack-year smoking history. She has never used smokeless tobacco. She reports current alcohol use of about 1.0 standard drink of alcohol per week. She reports that she does not use drugs. Ms. NBeckahas No  Known Allergies.   HPI  Today, she is being contacted for medication management.  Apparently there was some type of confusion regarding her follow-up appointment.  She was expecting somebody to give her a call and it did not happen.  Today we  will be taking care of her refills since she ran out of medicine, but I have informed the patient that we need to see her back in approximately 1 month for a face-to-face visit at which time she is pending an update on her UDS.  Pharmacotherapy Assessment  Analgesic: Tramadol 50 mg 2 tablets twice daily (200 mg/day of tramadol) MME/day:20mg /day.   Monitoring: [place] PMP: PDMP reviewed during this encounter.       Pharmacotherapy: No side-effects or adverse reactions reported. Compliance: No problems identified. Effectiveness: Clinically acceptable. Plan: Refer to "POC".  UDS:  Summary  Date Value Ref Range Status  12/26/2017 FINAL  Final    Comment:    ==================================================================== TOXASSURE SELECT 13 (MW) ==================================================================== Test                             Result       Flag       Units Drug Present and Declared for Prescription Verification   Tramadol                       >8197        EXPECTED   ng/mg creat   O-Desmethyltramadol            >8197        EXPECTED   ng/mg creat   N-Desmethyltramadol            6198         EXPECTED   ng/mg creat    Source of tramadol is a prescription medication.    O-desmethyltramadol and N-desmethyltramadol are expected    metabolites of tramadol. Drug Present not Declared for Prescription Verification   Lorazepam                      457          UNEXPECTED ng/mg creat    Source of lorazepam is a scheduled prescription medication. ==================================================================== Test                      Result    Flag   Units      Ref Range   Creatinine              61               mg/dL       >=20 ==================================================================== Declared Medications:  The flagging and interpretation on this report are based on the  following declared medications.  Unexpected results may arise from  inaccuracies in the declared medications.  **Note: The testing scope of this panel includes these medications:  Tramadol (Ultram)  **Note: The testing scope of this panel does not include following  reported medications:  Acetaminophen  Albuterol  Aspirin  Benzonatate (Tessalon)  Betamethasone (Diprolene)  Caffeine  Cetirizine (Zyrtec D)  Chondroitin (Glucosamine-Chondroitin)  Dextromethorphan  Diphenhydramine (Benadryl)  Doxepin (Sinequan)  Duloxetine (Cymbalta)  Eye Drops  Gabapentin (Neurontin)  Glucosamine (Glucosamine-Chondroitin)  Hydrochlorothiazide (Lisinopril-HCTZ)  Iron (Ferrous Sulfate)  Lisinopril (Lisinopril-HCTZ)  Magnesium  Medroxyprogesterone (Provera)  Meloxicam (Mobic)  Metoclopramide (Reglan)  Multivitamin (MVI)  Naproxen (Naprosyn)  Phentermine (Adipex-P)  Promethazine  Pseudoephedrine (Zyrtec D)  Supplement  Topical  Valacyclovir (Valtrex)  Vitamin D2 (Drisdol) ==================================================================== For clinical consultation, please call [phone]. ====================================================================     Laboratory Chemistry Profile   Renal Lab Results  Component Value Date   BUN  13 09/03/2019   CREATININE 0.69 09/03/2019   LABCREA 82 37/16/9678   BCR NOT APPLICABLE 93/81/0175   GFRAA 115 09/03/2019   GFRNONAA 99 09/03/2019     Hepatic Lab Results  Component Value Date   AST 16 09/03/2019   ALT 14 09/03/2019   ALBUMIN 4.5 03/05/2016   ALKPHOS 123 (H) 03/05/2016     Electrolytes Lab Results  Component Value Date   NA 138 09/03/2019   K 4.2 09/03/2019   CL 103 09/03/2019   CALCIUM 9.7 09/03/2019   MG 2.4 09/03/2019     Bone Lab Results   Component Value Date   VD25OH 52 09/03/2019     Inflammation (CRP: Acute Phase) (ESR: Chronic Phase) Lab Results  Component Value Date   CRP 0.9 03/04/2017   ESRSEDRATE 22 (H) 03/04/2017       Note: Above Lab results reviewed.  Imaging  Nocturnal polysomnography with REM behavior disorder Dohmeier, Asencion Partridge, MD     06/23/2018  8:36 AM PATIENT'S NAME:  Wendy Valencia DOB:      06/14/2017      MR#:    [id number]     DATE OF RECORDING: 06/13/2018  Farrel Demark REFERRING M.D.:  Wendy Mutters PA-C Study Performed:   Baseline Polysomnogram HISTORY:  --- Sleep and medical history: had a sleep study in  [place], [place] about 10 years ago.  Carries a diagnosis of  morbid obesity with comorbidities, prediabetes,  hypercholesterolemia, hypertension, tobacco abuse, vitamin D  deficiency, she most recently wished to start Cymbalta for the  treatment of fibromyalgia.  She also has a history of irritable bowel syndrome,  gastroesophageal reflux syndrome, arthritis, anemia, allergies,  kidney stones, and plantar fasciitis. The patient has very long  standing Insomnia and carries a diagnosis of Fibromyalgia. Pain  treatment is with Dr. Leeanne Mannan. He treats for hip bursitis. Interestingly, the patient was prescribed phentermine for weight  loss (which can cause insomnia) and was taken off in mid -March  2020 after she lost zero weight since the last visit to primary  care office. She had confessed to stress eating grazing during  the day.  Body mass index is a 38 kg/m2. Hemoglobin A1c was 5.9,  she is pre-diabetic.  She states her total sleep time with medication and only then  amounts to 4 hours.  She does not suffer from excessive daytime  sleepiness and she does not take naps even if she would try to  nap she could not fall asleep.  The patient endorsed the Epworth Sleepiness Scale at 0 points.  The patient's weight 247 pounds with a height of 68 (inches),  resulting in a BMI of 37.4  kg/m2.The patient's neck circumference  measured 17 inches.  CURRENT MEDICATIONS: none listed by tech   PROCEDURE:  This is a multichannel digital polysomnogram  utilizing the Somnostar 11.2 system.  Electrodes and sensors were  applied and monitored per AASM Specifications.   EEG, EOG, Chin  and Limb EMG, were sampled at 200 Hz.  ECG, Snore and Nasal  Pressure, Thermal Airflow, Respiratory Effort, CPAP Flow and  Pressure, Oximetry was sampled at 50 Hz. Digital video and audio  were recorded.      BASELINE STUDY: Lights Out was at 22:41 and Lights On at 04:59.   Total recording time (TRT) was 379 minutes, with a total sleep  time (TST) of 352.5 minutes.   The patient's sleep latency was  15.5 minutes.  REM latency was 98.5 minutes.  The sleep  efficiency was 93.0%.     SLEEP ARCHITECTURE: WASO (Wake after sleep onset) was 23.5  minutes.  There were 6.5 minutes in Stage N1, 276 minutes Stage  N2, 43.5 minutes Stage N3 and 26.5 minutes in Stage REM.  The  percentage of Stage N1 was 1.8%, Stage N2 was 78.3%, Stage N3 was  12.3% and Stage R (REM sleep) was 7.5%.   RESPIRATORY ANALYSIS:  There were a total of 17 respiratory  events:  0 obstructive apneas, 0 central apneas and 0 mixed  apneas with a total of 0 apneas and an apnea index (AI) of 0  /hour. There were 17 hypopneas with a hypopnea index of 2.9  /hour. The patient also had 0 respiratory event related arousals  (RERAs).     The total APNEA/HYPOPNEA INDEX (AHI) was 2.9 /hour and the total  RESPIRATORY DISTURBANCE INDEX was 0. 2.9 /hour.  0 events  occurred in REM sleep and 34 events in NREM. The REM AHI was 0. 0  /hour, versus a non-REM AHI of 3.1. The patient spent 278 minutes  of total sleep time in the supine position and 75 minutes in  non-supine. The supine AHI was 2.2 versus a non-supine AHI of  5.6.  OXYGEN SATURATION & C02:  The Wake baseline 02 saturation was  96%, with the lowest being 81%. Time spent below 89%  saturation  equaled 12 minutes. The arousals were noted as: 61 were spontaneous, 0 were  associated with PLMs, and 11 were associated with respiratory  events. The patient had a total of 0 Periodic Limb Movements.   The Periodic Limb Movement (PLM) index was 0 and the PLM Arousal  index was 0/hour.  Audio and video analysis did not show any abnormal or unusual  movements, behaviors, phonations or vocalizations. There were  several very brief REM sleep phases before 3.45 AM, and at that  time REM sleep reached almost 28 minutes.  Sleep was not very  fragmented and overall sleep time was excellent.  The patient didn't have bathroom breaks. She slept for 4 hours  supine and only slept on her side for the very last hours. Sleep  efficiency was 93%! Mild Snoring was noted. EKG was in keeping with normal sinus  rhythm (NSR). Post-study, the patient indicated that sleep was poor, restless  and interrupted.   IMPRESSION:  1. No evidence of Obstructive Sleep Apnea (OSA) - No PLMs, and no  Insomnia- total sleep time was close to 6 hours.   2. Normal EKG.  RECOMMENDATIONS:  1. No need for CPAP titration or other interventional therapy.   There was no organic sleep disorder identified.   I certify that I have reviewed the entire raw data recording  prior to the issuance of this report in accordance with the  Standards of Accreditation of the American Academy of Sleep  Medicine (AASM)  Larey Seat, MD    06-23-2018 Diplomat, American Board of Psychiatry and Neurology  Diplomat, American Board of Sleep Medicine Medical Director, [facility] [place], [place]  Assessment  The primary encounter diagnosis was Chronic pain syndrome. Diagnoses of Chronic generalized pain (Primary Area of Pain), Fibromyalgia affecting multiple sites (Secondary Area of Pain), Chronic shoulder pain (Tertiary Area of Pain) (Bilateral) (R>L), Chronic hip pain (Fourth Area of Pain) (Bilateral)  (R>L), and Pharmacologic therapy were also pertinent to this visit.  Plan of Care  Problem-specific:  No problem-specific Assessment & Plan notes found for this encounter.  Ms. Veronda Gabor Enderson has a current medication list which includes the following long-term medication(s): duloxetine, duloxetine, gabapentin, lisinopril-hydrochlorothiazide, quetiapine, and tramadol.  Pharmacotherapy (Medications Ordered): Meds ordered this encounter  Medications   traMADol (ULTRAM) 50 MG tablet    Sig: Take 1 tablet (50 mg total) by mouth every 6 (six) hours as needed for severe pain.    Dispense:  120 tablet    Refill:  0    Chronic Pain: STOP Act (Not applicable) Fill 1 day early if closed on refill date. Avoid benzodiazepines within 8 hours of opioids   Orders:  No orders of the defined types were placed in this encounter.  Follow-up plan:   No follow-ups on file.      Interventional options: Considering: Diagnostic bilateral intra-articular shoulder joint injection Diagnostic bilateral suprascapular nerve block Possible bilateral suprascapular nerve RFA Diagnostic bilateral intra-articular hip joint injection Diagnostic/therapeutic bilateral trochanteric bursa injection Diagnostic bilateral femoral nerve +obturator nerve block Possible bilateral femoral nerve + obturator nerve RFA Diagnostic/therapeuticLidocaine infusion Diagnostic/therapeuticTrigger point injection Diagnostic bilateral cervical facet block Diagnostic bilateral cervical facet RFA   Palliative PRN treatment(s): Diagnostic bilateralintra-articularhipjoint injection#2+ bilateral trochanteric bursa injection #2, under fluoroscopic guidance and IV sedation    Recent Visits Date Type Provider Dept  09/10/19 Telemedicine Milinda Pointer, MD [facility]  Showing recent visits within past 90 days and meeting all other requirements Today's Visits Date Type Provider Dept  10/22/19  Telemedicine Milinda Pointer, MD [facility]  Showing today's visits and meeting all other requirements Future Appointments Date Type Provider Dept  11/04/19 Appointment Milinda Pointer, MD [facility]  Showing future appointments within next 90 days and meeting all other requirements  I discussed the assessment and treatment plan with the patient. The patient was provided an opportunity to ask questions and all were answered. The patient agreed with the plan and demonstrated an understanding of the instructions.  Patient advised to call back or seek an in-person evaluation if the symptoms or condition worsens.  Duration of encounter: 12 minutes.  Note by: Gaspar Cola, MD Date: 10/22/2019; Time: 3:33 PM

## 2019-10-30 IMAGING — CR DG HIP (WITH OR WITHOUT PELVIS) 2-3V*R*
3 series · 3 of 3 positions shown · non-contrast
Comparison: None.

CLINICAL DATA: Chronic bilateral hip pain

EXAM:
DG HIP (WITH OR WITHOUT PELVIS) 2-3V RIGHT

[hip ap]
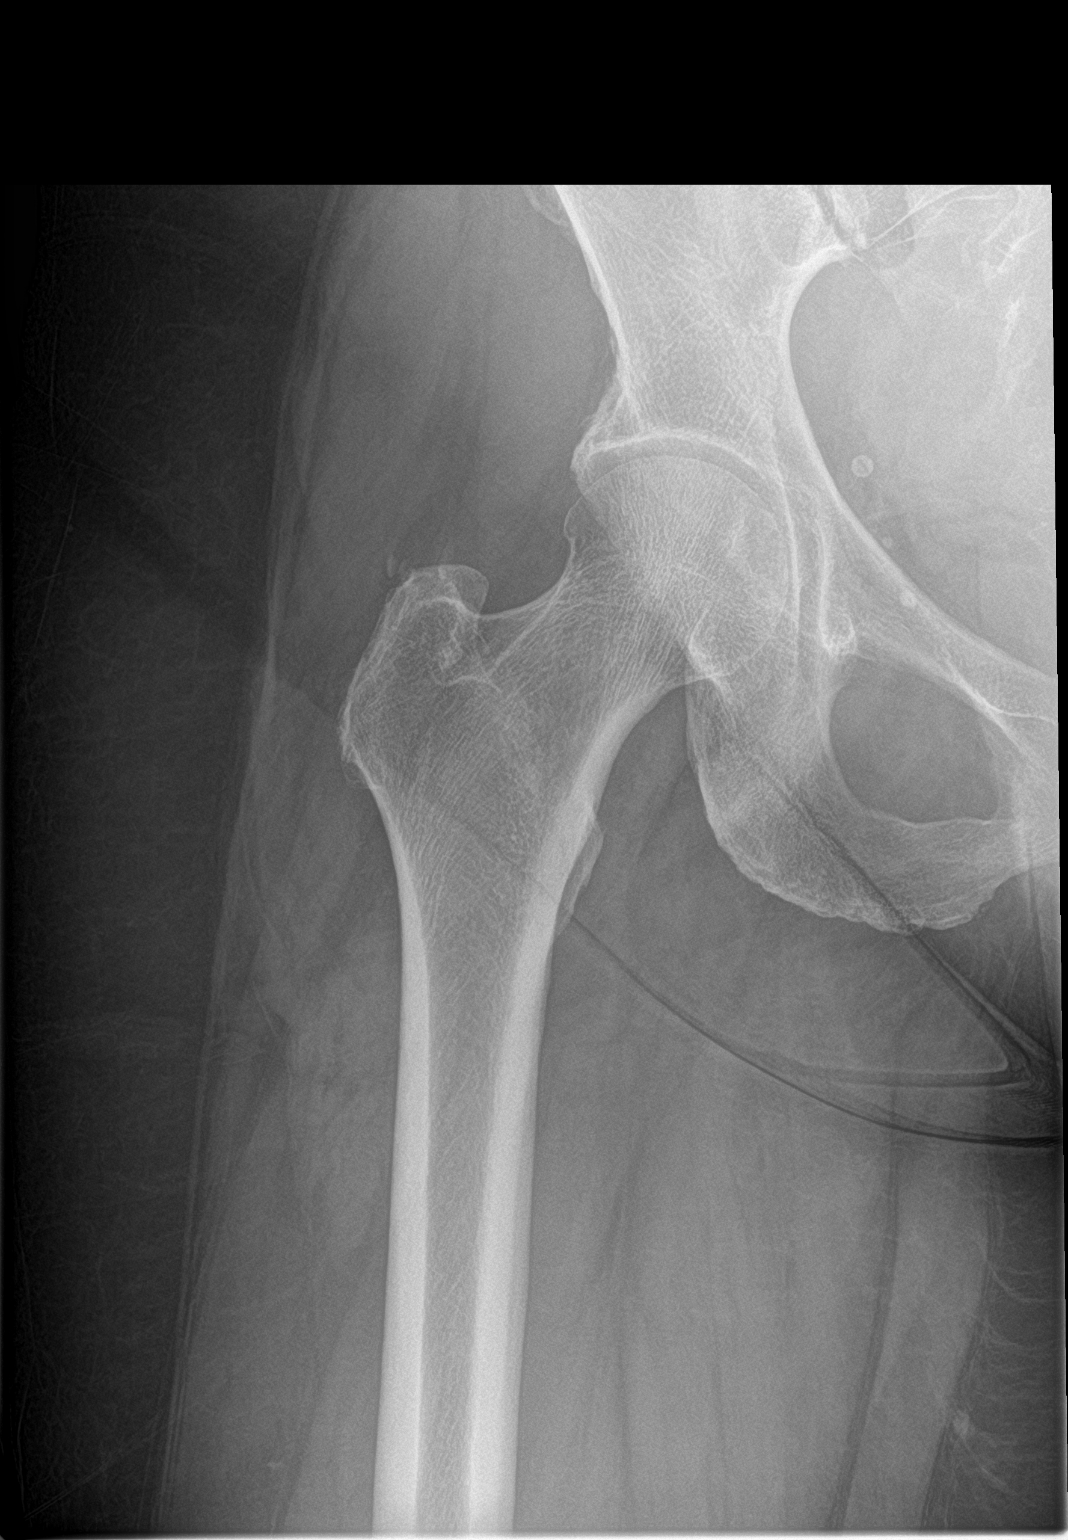

[pelvis ap]
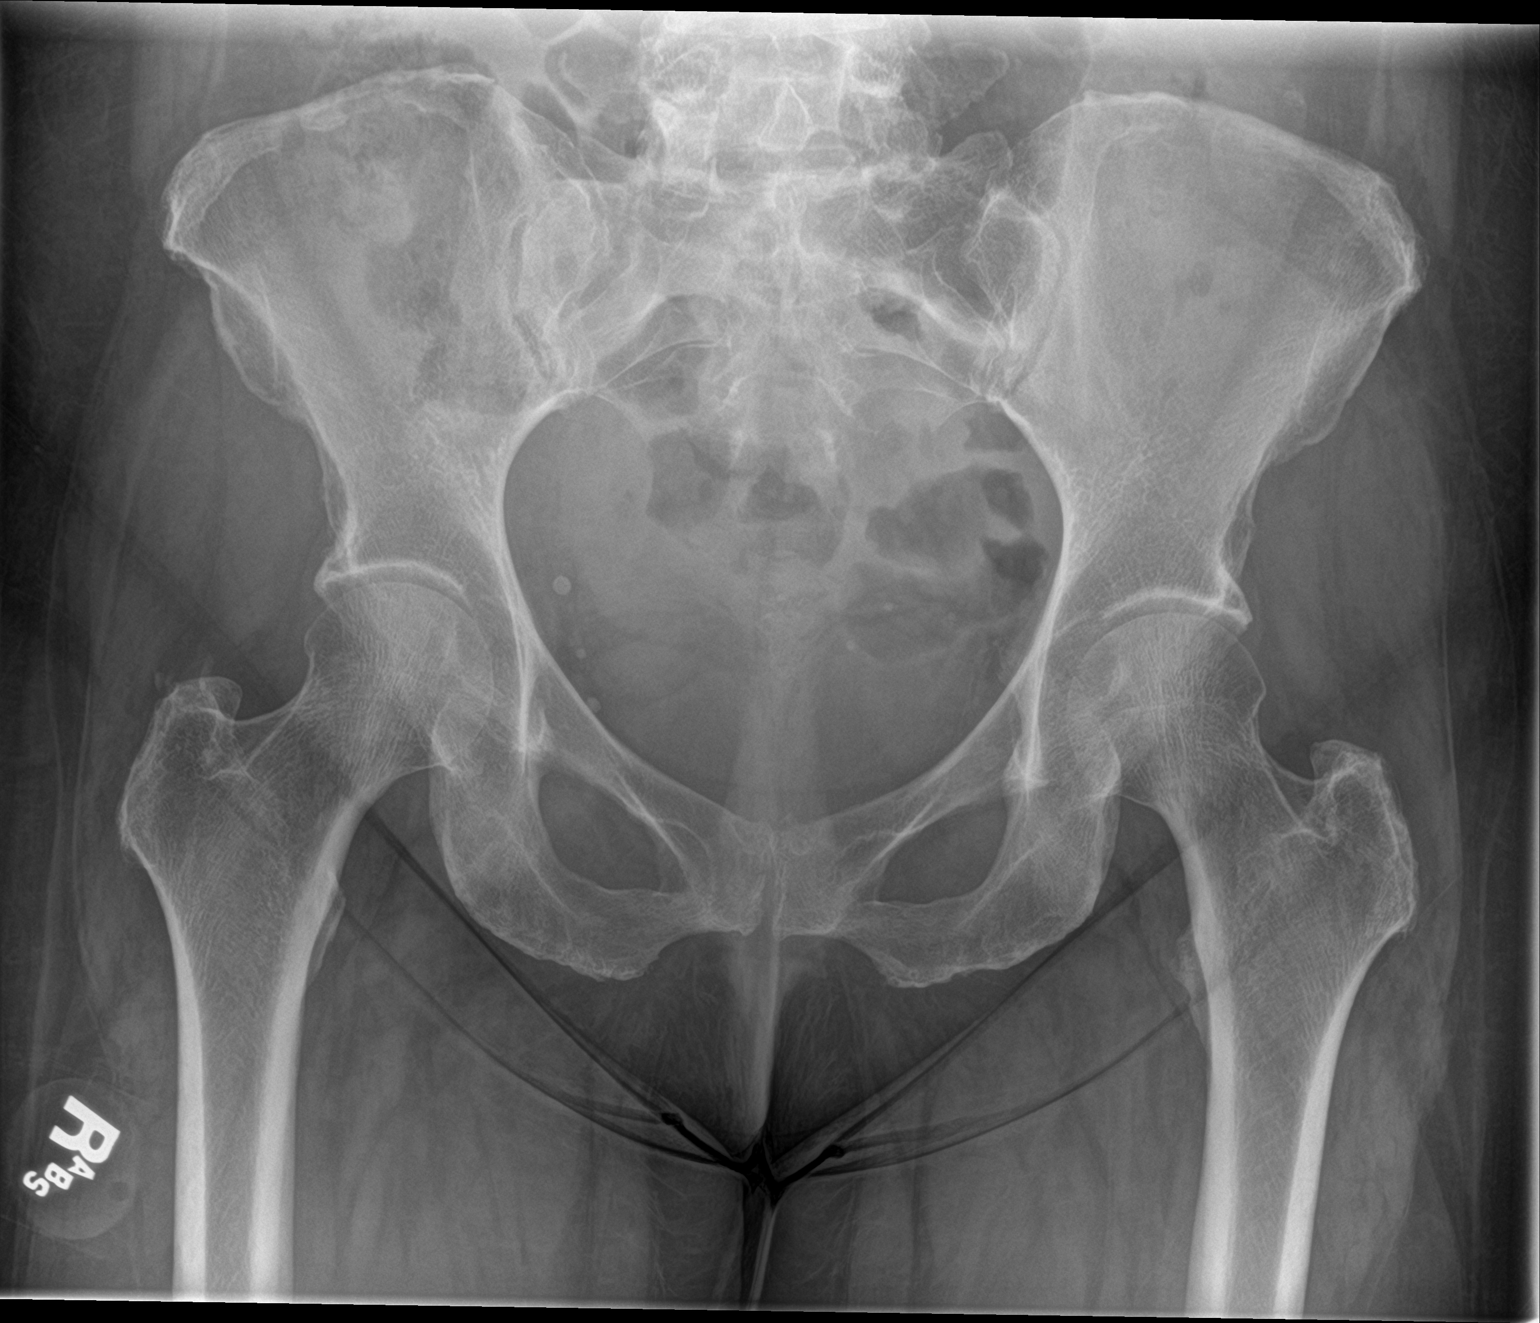

[hip lat]
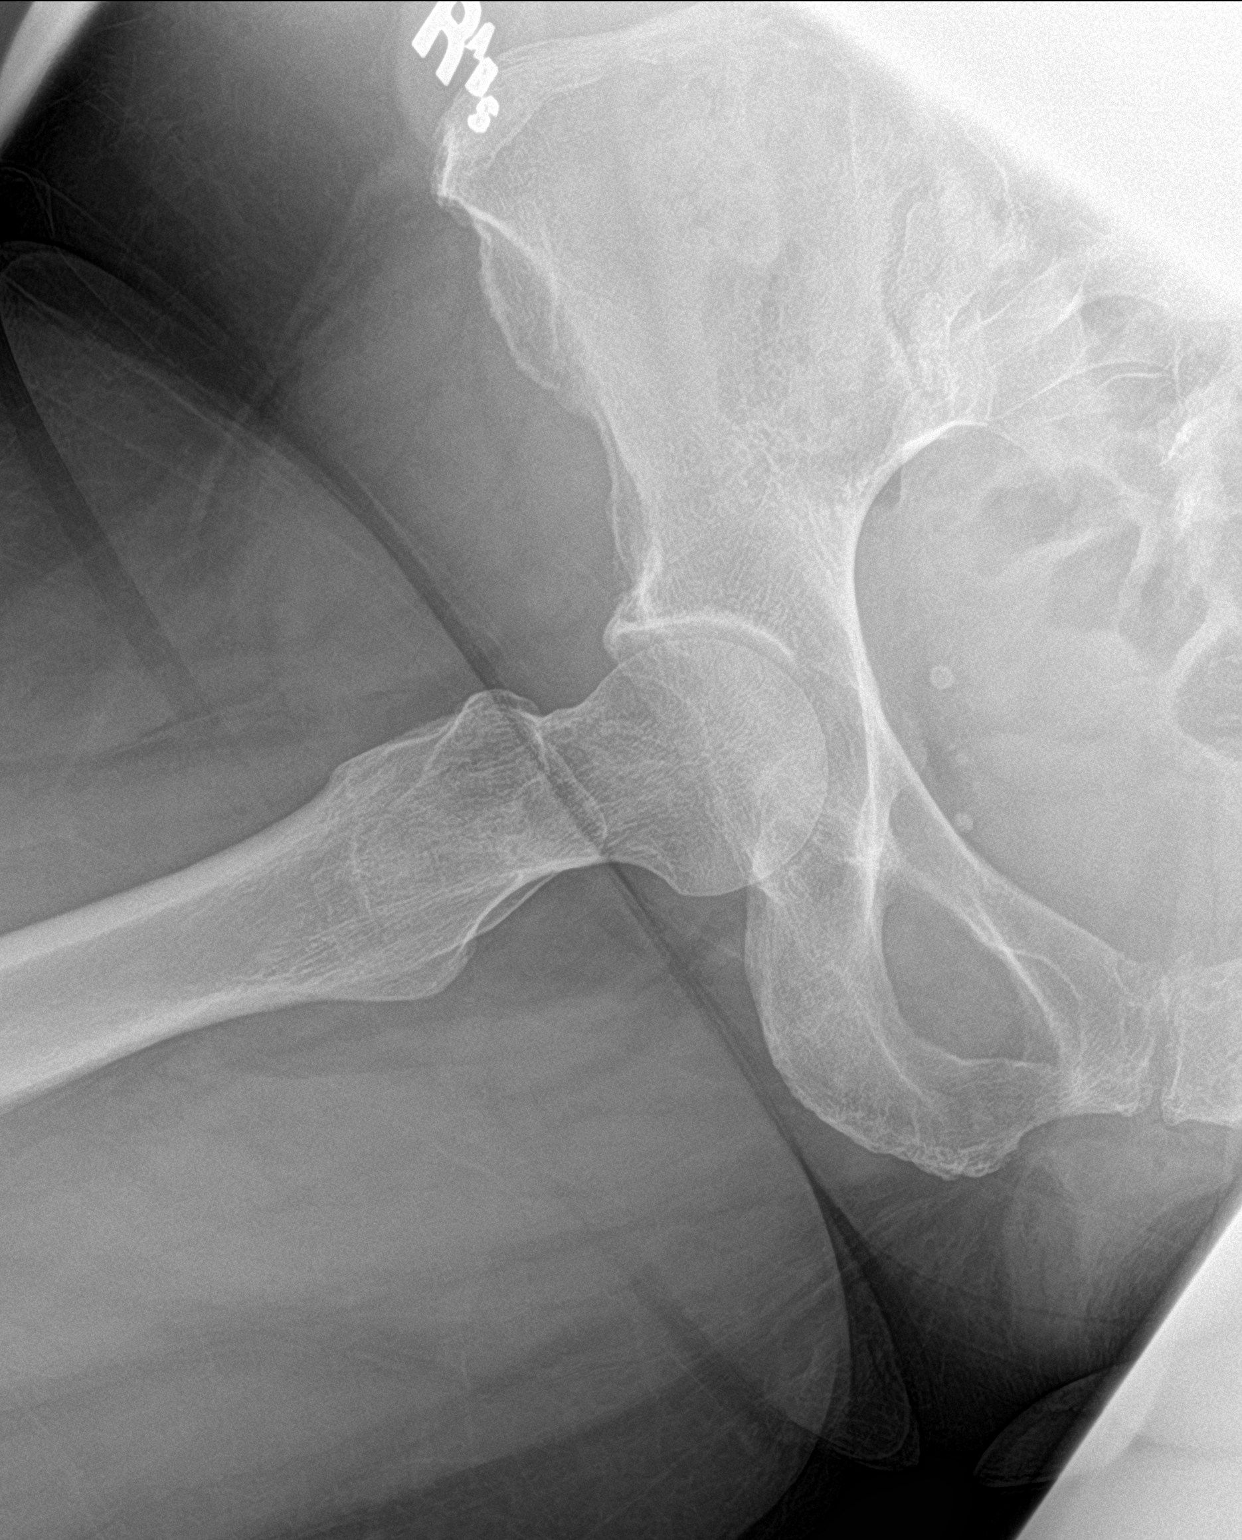

[3 of 3 positions shown; findings below may reference images not displayed]

FINDINGS: There is no evidence of hip fracture or dislocation. Small soft
tissue calcification adjacent to the greater trochanter compatible
with chronic calcific bursitis.
IMPRESSION: No acute abnormality.  Mild chronic bursitis.

## 2019-10-30 IMAGING — CR DG SHOULDER 2+V*R*
3 series · 3 of 3 positions shown · non-contrast
Comparison: None.

CLINICAL DATA: Chronic shoulder pain

EXAM:
RIGHT SHOULDER - 2+ VIEW

[shoulder grashey]
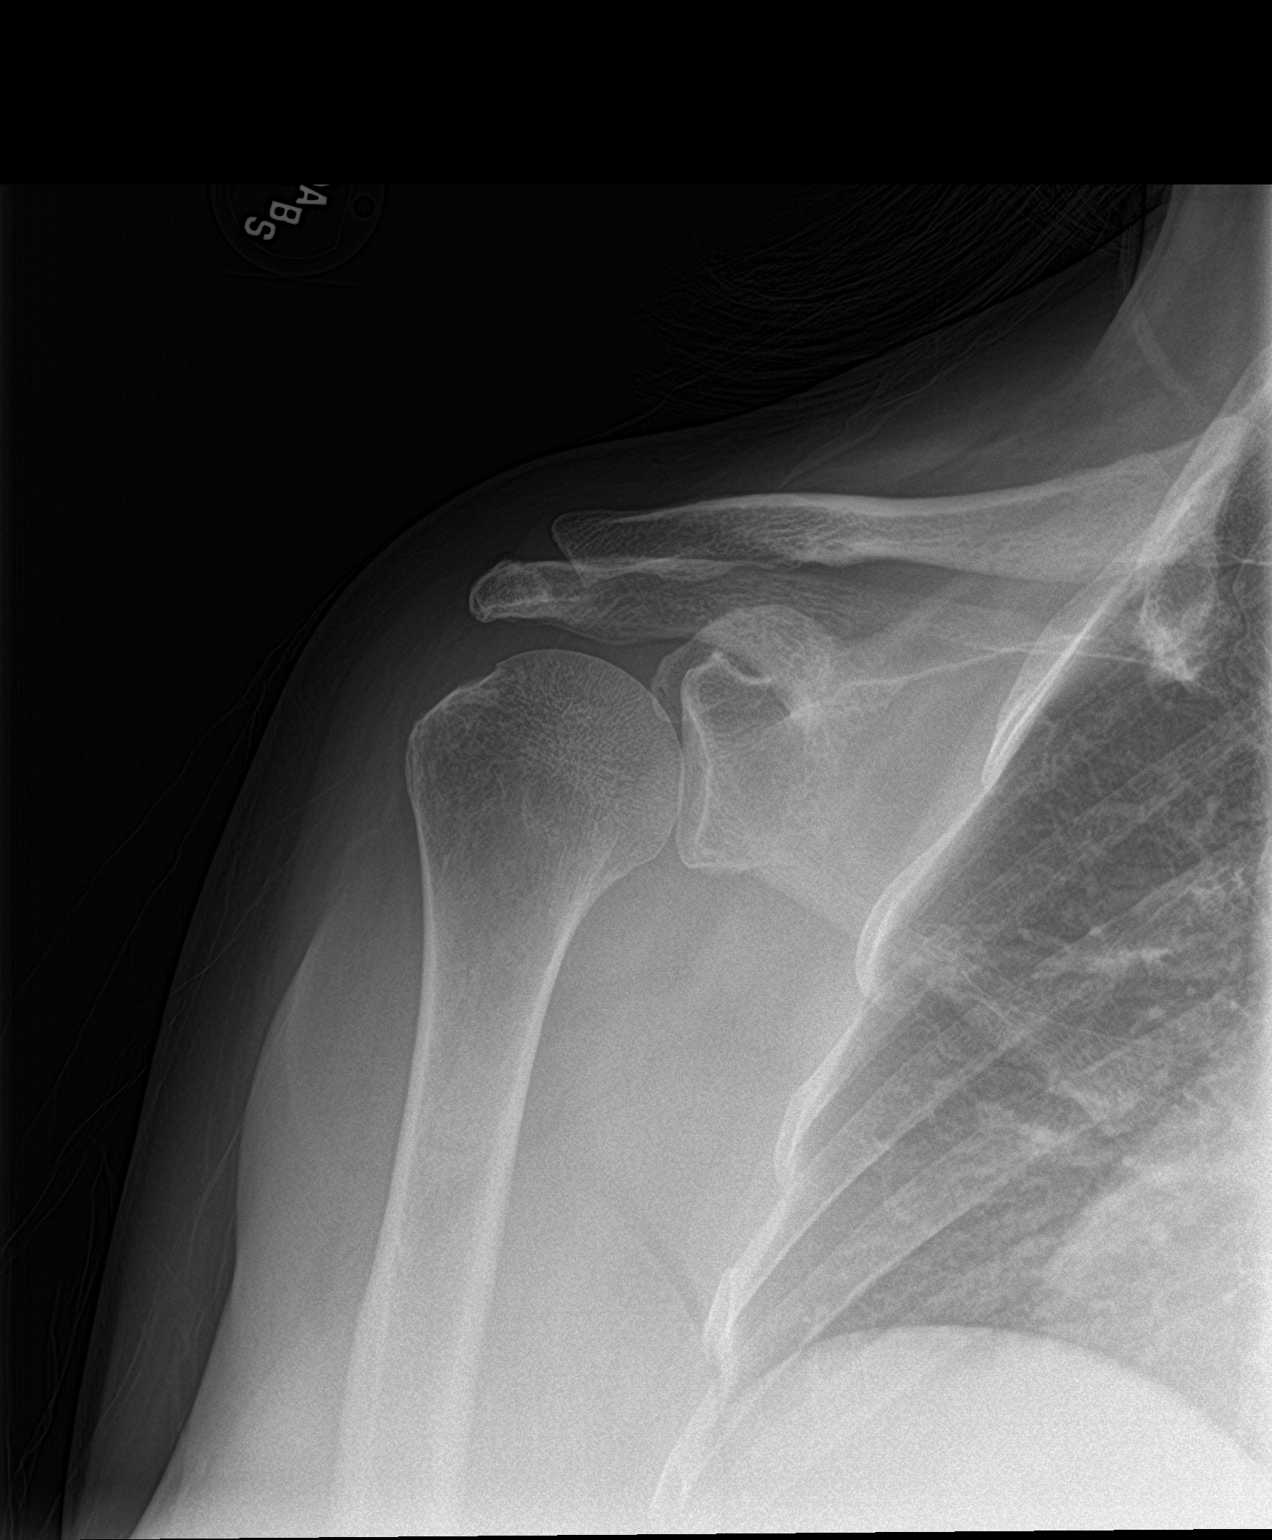

[shoulder y view]
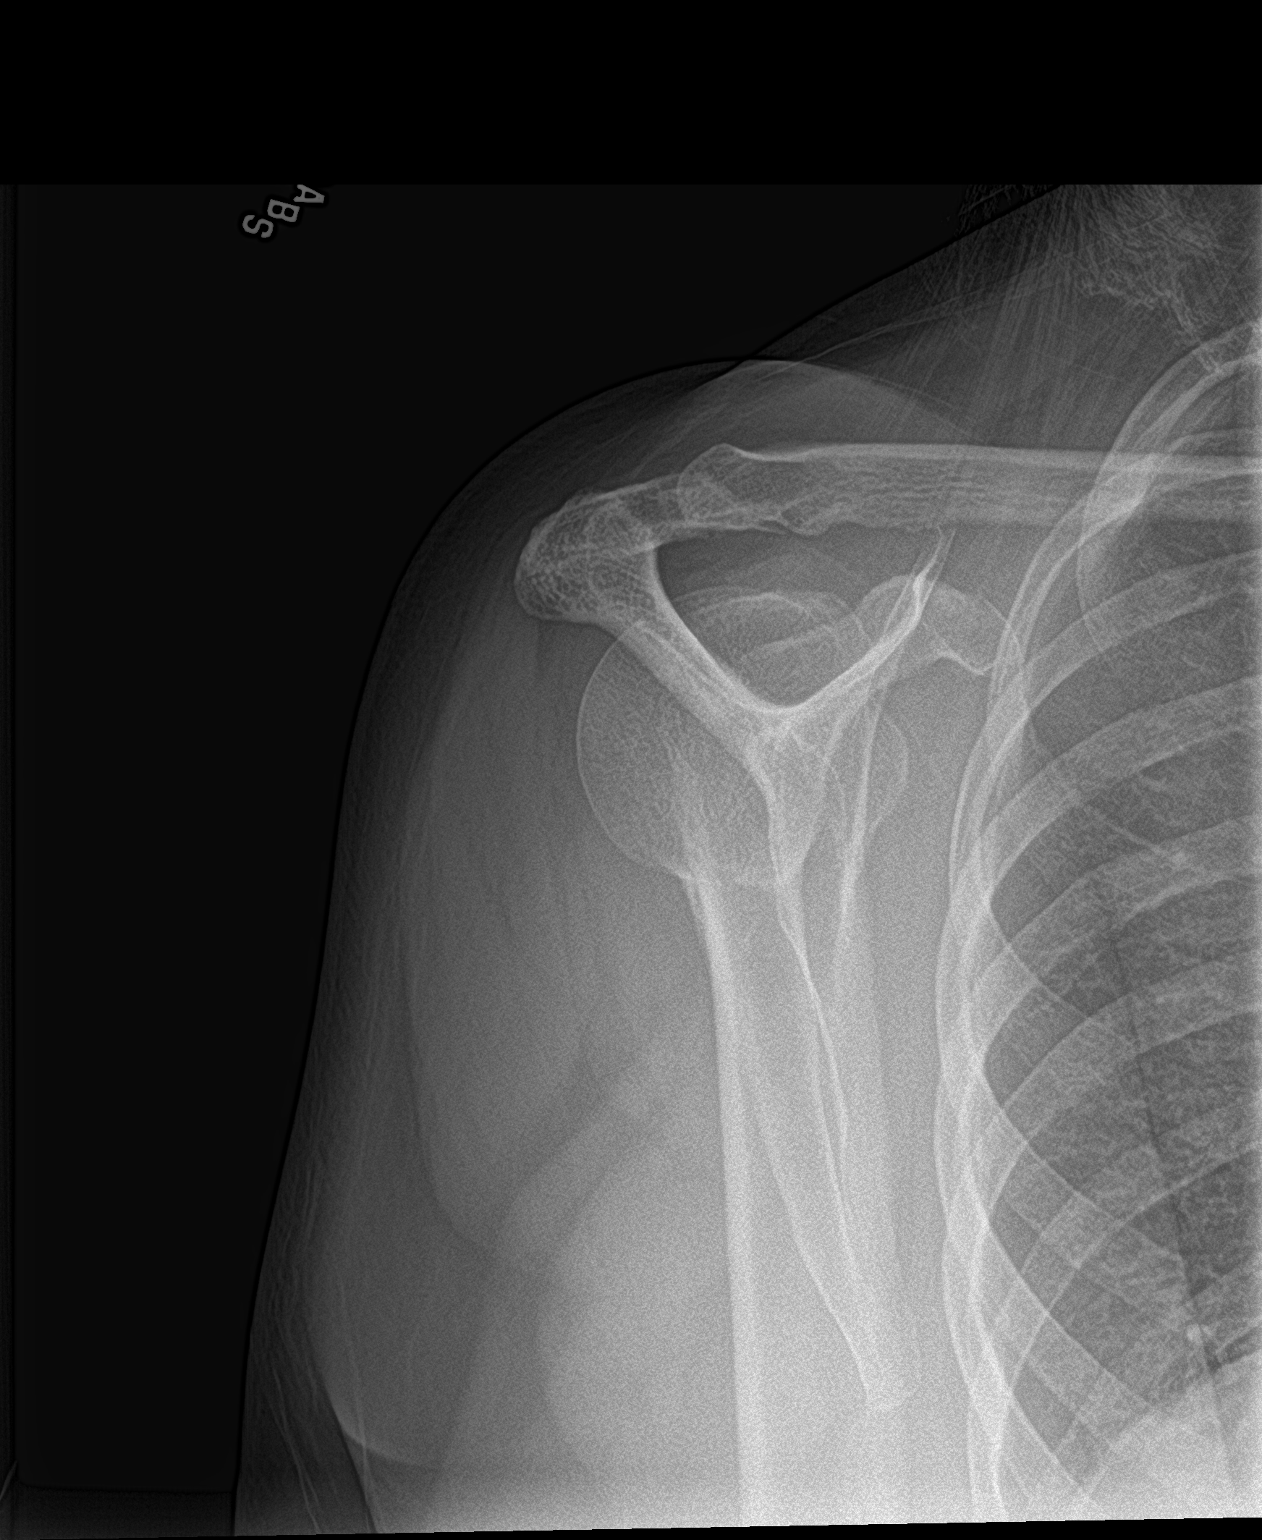

[shoulder axillary]
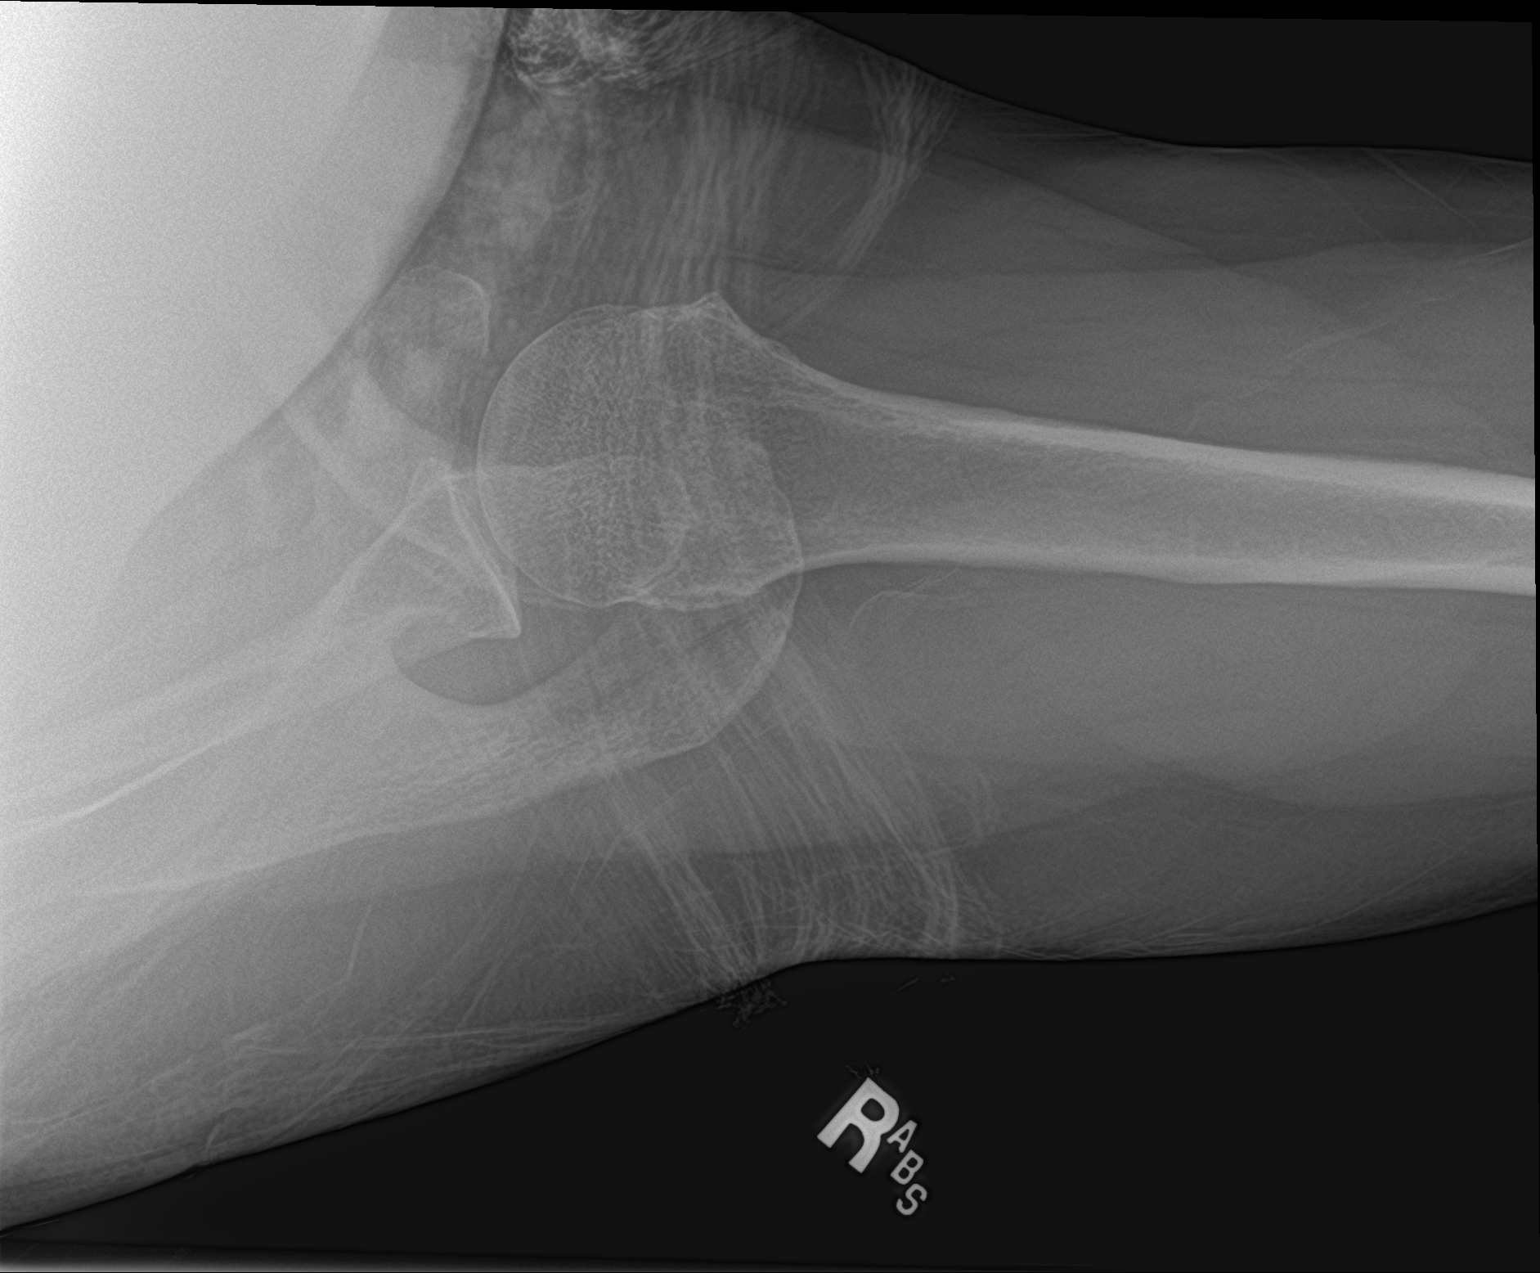

[3 of 3 positions shown; findings below may reference images not displayed]

FINDINGS: There is no evidence of fracture or dislocation. There is no
evidence of arthropathy or other focal bone abnormality. Soft
tissues are unremarkable.
IMPRESSION: Negative.

## 2019-10-30 IMAGING — CR DG HIP (WITH OR WITHOUT PELVIS) 2-3V*L*
3 series · 3 of 3 positions shown · non-contrast
Comparison: None.

CLINICAL DATA: Bilateral chronic hip pain

EXAM:
DG HIP (WITH OR WITHOUT PELVIS) 2-3V LEFT

[pelvis ap]
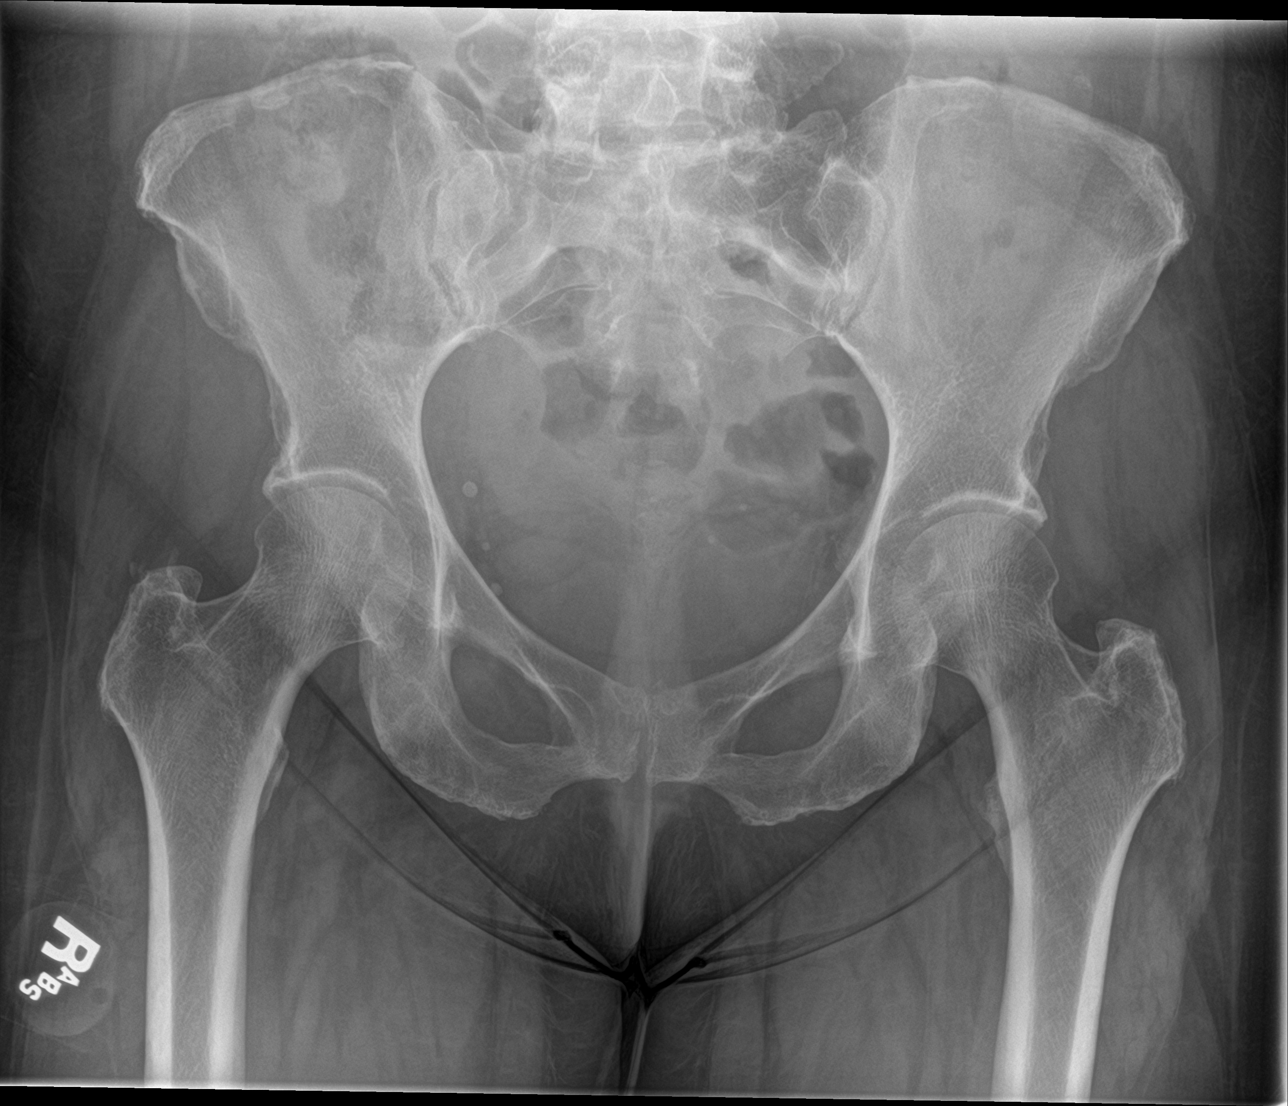

[hip ap]
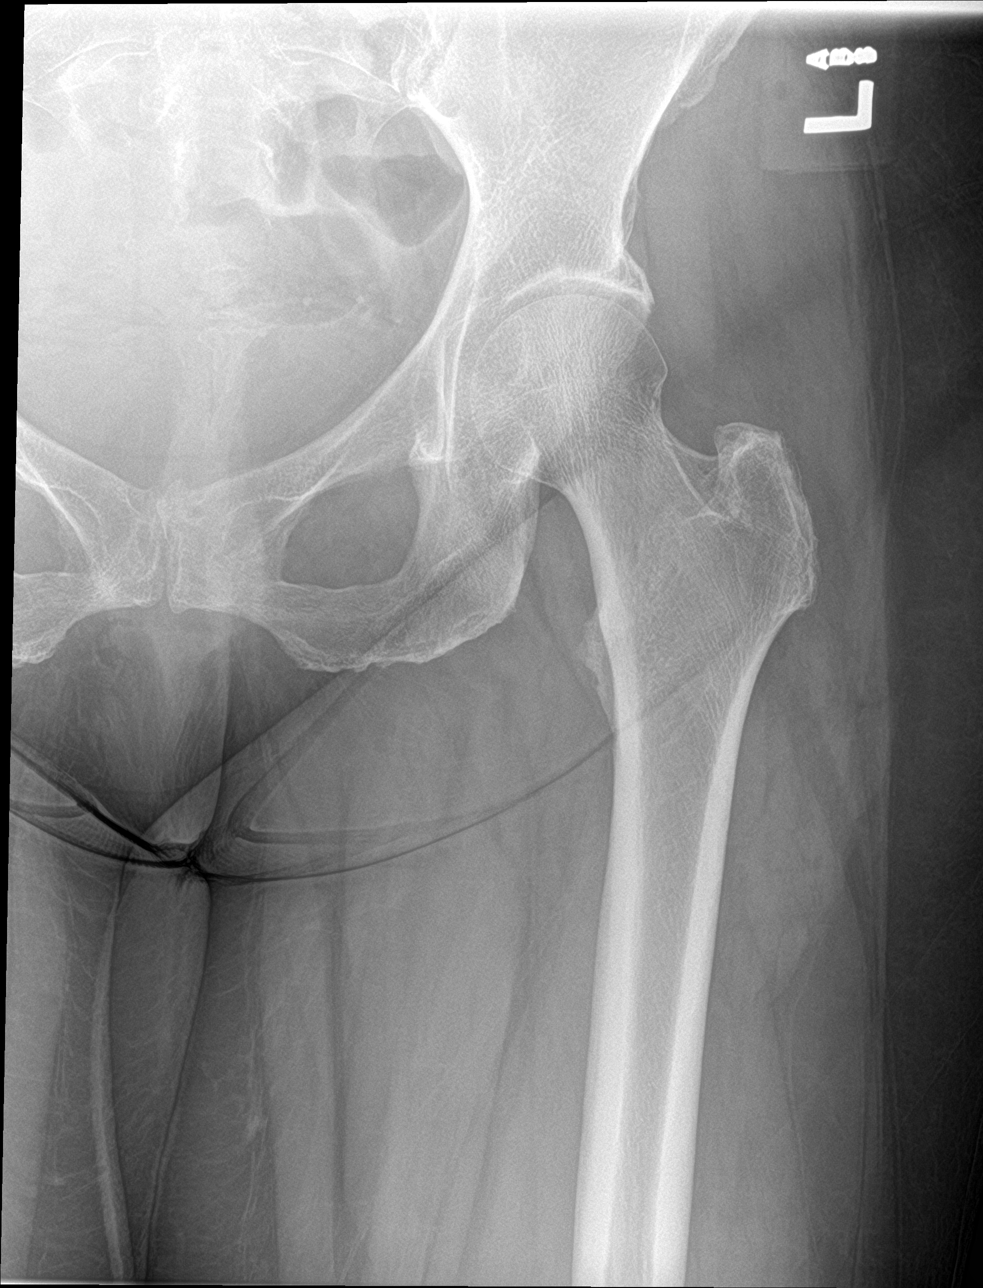

[hip lat]
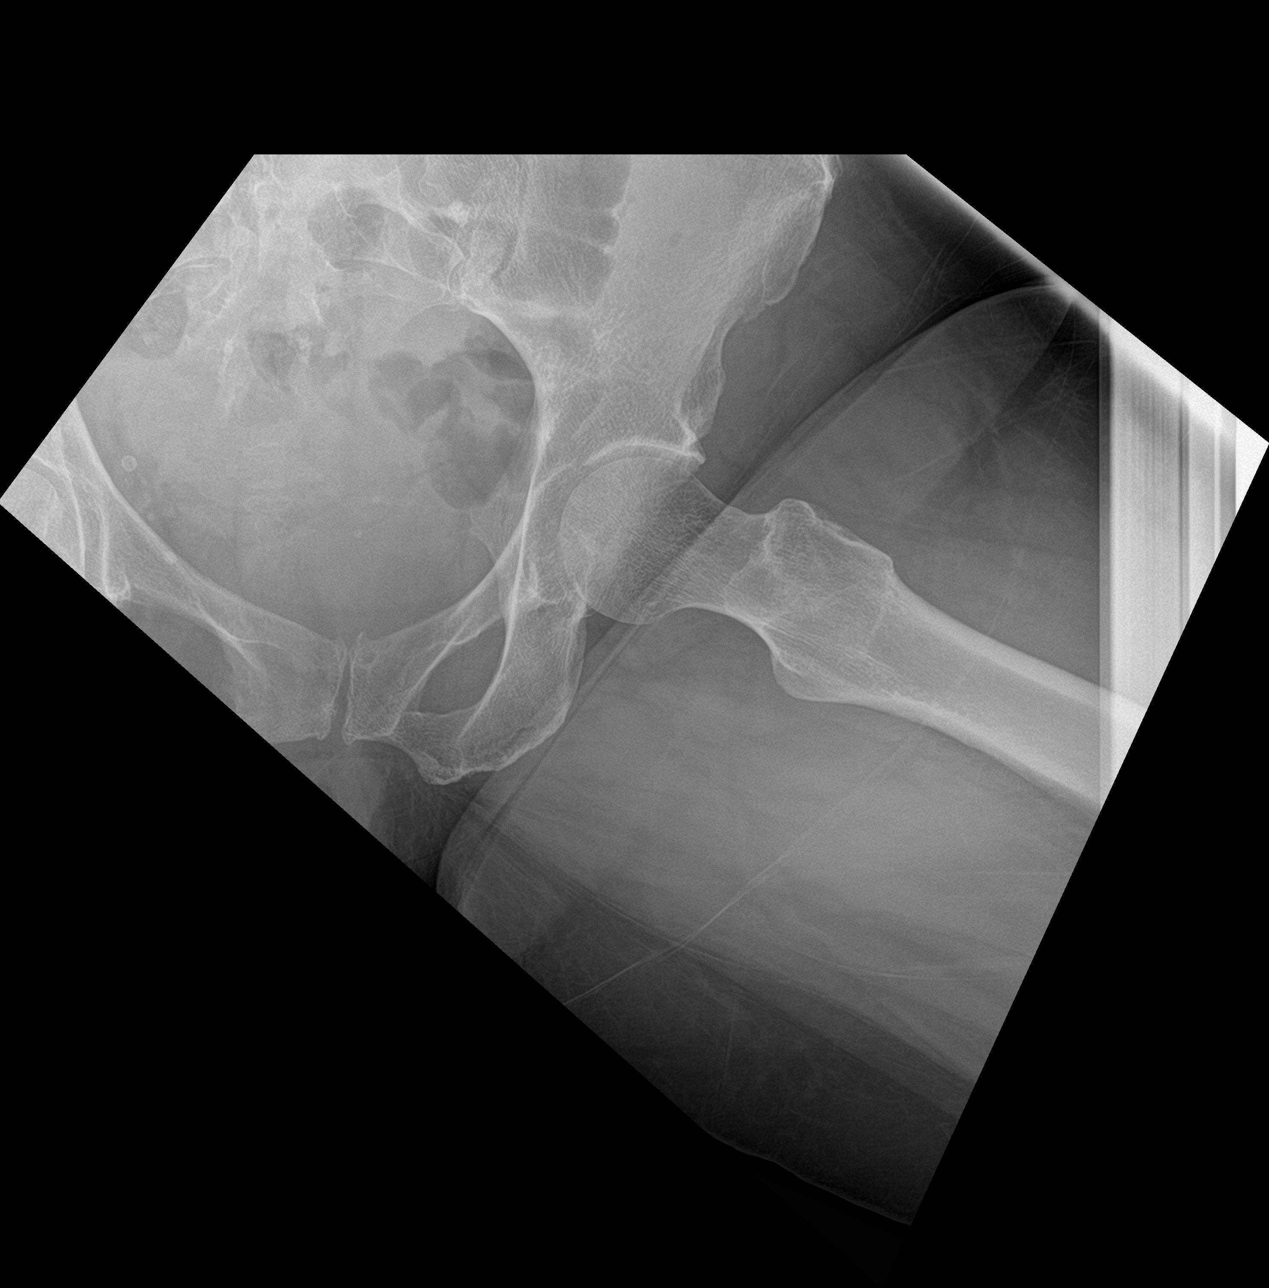

[3 of 3 positions shown; findings below may reference images not displayed]

FINDINGS: There is no evidence of hip fracture or dislocation. There is no
evidence of arthropathy or other focal bone abnormality.
IMPRESSION: Negative.

## 2019-11-02 ENCOUNTER — Encounter: Payer: Self-pay | Admitting: Physical Therapy

## 2019-11-02 ENCOUNTER — Ambulatory Visit: Payer: BC Managed Care – PPO | Attending: Obstetrics and Gynecology | Admitting: Physical Therapy

## 2019-11-02 ENCOUNTER — Other Ambulatory Visit: Payer: Self-pay

## 2019-11-02 DIAGNOSIS — M6281 Muscle weakness (generalized): Secondary | ICD-10-CM | POA: Diagnosis not present

## 2019-11-02 DIAGNOSIS — R279 Unspecified lack of coordination: Secondary | ICD-10-CM | POA: Diagnosis not present

## 2019-11-02 NOTE — Therapy (Addendum)
[facility] [address], [address] [place], [place], [address] Phone: [phone]   Fax:  [phone]  Physical Therapy Evaluation  Patient Details  Name: Wendy Valencia MRN: [id number] Date of Birth: January 12, 1966 Referring Provider (PT): Doree Albee, [place]   Encounter Date: 11/02/2019   PT End of Session - 11/02/19 1745    Visit Number 1    Date for PT Re-Evaluation 01/25/20    PT Start Time 1620    PT Stop Time 1715    PT Time Calculation (min) 55 min    Activity Tolerance Patient tolerated treatment well    Behavior During Therapy [facility] for tasks assessed/performed           Past Medical History:  Diagnosis Date  . Allergy   . Anemia   . Anxiety   . Arthritis   . Fibromyalgia   . GERD (gastroesophageal reflux disease)   . Hypertension   . IBS (irritable bowel syndrome)   . Nephrolithiasis   . Plantar fasciitis of right foot    wears boot     Past Surgical History:  Procedure Laterality Date  . BREATH TEK H PYLORI  07/11/2011   Procedure: BREATH TEK H PYLORI;  Surgeon: Mariella Saa, MD;  Location: [facility];  Service: General;  Laterality: N/A;  . CHOLECYSTECTOMY  10/01/2011   Procedure: LAPAROSCOPIC CHOLECYSTECTOMY WITH INTRAOPERATIVE CHOLANGIOGRAM;  Surgeon: Mariella Saa, MD;  Location: [facility];  Service: General;  Laterality: N/A;  . COLONOSCOPY    . GASTRIC ROUX-EN-Y  10/01/2011   Procedure: LAPAROSCOPIC ROUX-EN-Y GASTRIC;  Surgeon: Mariella Saa, MD;  Location: [facility];  Service: General;  Laterality: N/A;  . Left shoulder arthroscopic surgery  yrs ago  . tummy tuck  2005    There were no vitals filed for this visit.    Subjective Assessment - 11/02/19 1626    Subjective Pt states she is having constipation and the medicine for that is causing a lot of bubbling cramping in the stomach.  Reports that she had IBS-C for 10-15 years.  She reports her body knows when she is home  and is able to go more easily.  Pt states when she travels it can be worse and uses enema on certain occasions.  Pt states she is drinking 3-4 glasses of water.    Pertinent History gastic bypass 2014; tummy tuck 2011    Currently in Pain? No/denies              [facility] PT Assessment - 11/06/19 0001      Assessment   Medical Diagnosis K59.00 (ICD-10-CM) - Constipation, unspecified constipation type; N94.19 (ICD-10-CM) - Dyspareunia due to medical condition in female    Referring Provider (PT) Doree Albee, PA-C    Onset Date/Surgical Date --   worsening but ongoing issue 10-15 years   Prior Therapy No      Precautions   Precautions None      Restrictions   Weight Bearing Restrictions No      Balance Screen   Has the patient fallen in the past 6 months Yes    How many times? 3   "i am clumsy"   Has the patient had a decrease in activity level because of a fear of falling?  No    Is the patient reluctant to leave their home because of a fear of falling?  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; banking; presentations sometimes standing      Cognition   Overall Cognitive Status Within Functional Limits for tasks assessed      Posture/Postural Control   Posture/Postural Control Postural limitations    Postural Limitations Anterior pelvic tilt;Increased lumbar lordosis      ROM / Strength   AROM / PROM / Strength Strength;PROM      PROM   Overall PROM Comments hip flexion limited 25% soft end feel      Strength   Overall Strength Comments core 4/5      Flexibility   Soft Tissue Assessment /Muscle Length yes    Hamstrings 80%      Palpation   Palpation comment tight lumbar paraspinals and gluteals      Ambulation/Gait   Gait Pattern Within Functional Limits                       Objective measurements completed on examination: See above findings.     Pelvic Floor Special Questions - 11/06/19 0001    Prior Pelvic/Prostate Exam Yes    Prior Pregnancies Yes    Number of Pregnancies 1    Number of Vaginal Deliveries 1    Currently Sexually Active Yes    Is this Painful Yes    Marinoff Scale pain interrupts completion    Urinary Leakage No    Urinary urgency Yes    Fecal incontinence No    Fluid intake 3 glasses    Falling out feeling (prolapse) No    Perineal Body/Introitus  Gaping    Prolapse Anterior Wall    Pelvic Floor Internal Exam pt identity confirmed and internal pelvic assessment done with pt consent    Exam Type Vaginal    Sensation normal    Palpation difficulty relaxing only one quick flick    Strength weak squeeze, no lift    Strength # of reps 1    Strength # of seconds 2    Tone high tone posterior, weakness anterior            [facility] Adult PT Treatment/Exercise - 11/06/19 0001      Self-Care   Self-Care Other Self-Care Comments    Other Self-Care Comments  toileting and breathing techniques; water intake                    PT Short Term Goals - 11/06/19 1225      PT SHORT TERM GOAL #1   Title independent with initial HEP    Time 4    Period Weeks    Status New    Target Date 11/30/19      PT SHORT TERM GOAL #2   Title ind with toileting techniques    Time 4    Period Weeks    Status New    Target Date 11/30/19      PT SHORT TERM GOAL #3   Title consistently drinking at least 8 glasses of water/day    Time 4    Period Weeks    Status New    Target Date 11/30/19             PT Long Term Goals - 11/06/19 1228      PT LONG TERM GOAL #1   Title Pt reports 60% easier  to have a BM    Time 12    Period Weeks    Status New    Target Date 01/25/20      PT LONG TERM GOAL #2   Title ind with advanced HEP    Time 12    Period Weeks    Status New    Target Date 01/25/20       PT LONG TERM GOAL #3   Title Report 50% less discomfort or pain during intercourse    Time 12    Period Weeks    Status New    Target Date 01/25/20      PT LONG TERM GOAL #4   Title improved ability to activate TrA due to decreased pain and improved strength demonstrated by squat performed with correct technique    Time 12    Period Weeks    Status New    Target Date 01/25/20                  Plan - 11/06/19 1230    Clinical Impression Statement Pt presents to skilled PT due to abdominal discomfort, pain with intercourse and increasing difficutly with having BMs.  Pt has scar tissue in abdomen that is very tight and ropey.  Pt has weakness in abdominal wall and increased extension in lumbar with tight lumbar erectors.  Pt has pelvic floor weakness anteriorly but high tone posterior.  Pt has poor pressure management with core strength deficits and decreased coordination causing breath holding.  Pt will benefit from skilled PT to address impairments as mentioned above.    Personal Factors and Comorbidities Comorbidity 3+;Time since onset of injury/illness/exacerbation    Comorbidities weight management surgery; vaginal delivery, fibromyalgia    Examination-Activity Limitations Toileting;Continence    Examination-Participation Restrictions Interpersonal Relationship    Stability/Clinical Decision Making Evolving/Moderate complexity    Clinical Decision Making Moderate    Rehab Potential Excellent    PT Frequency 1x / week    PT Duration 12 weeks    PT Treatment/Interventions ADLs/Self Care Home Management;Biofeedback;Cryotherapy;Electrical Stimulation;Moist Heat;Therapeutic activities;Therapeutic exercise;Neuromuscular re-education;Patient/family education;Manual techniques;Taping;Passive range of motion;Scar mobilization;Dry needling    PT Next Visit Plan review toileting; abdoiminal fascial release; f/u on H2O    PT Home Exercise Plan breathing with BM and toileting techniques;  water intake    Consulted and Agree with Plan of Care Patient           Patient will benefit from skilled therapeutic intervention in order to improve the following deficits and impairments:  Pain, Decreased range of motion, Decreased strength, Increased fascial restricitons, Postural dysfunction, Increased muscle spasms, Decreased coordination  Visit Diagnosis: Muscle weakness (generalized)  Unspecified lack of coordination     Problem List Patient Active Problem List   Diagnosis Date Noted  . Pharmacologic therapy 09/10/2019  . Uncomplicated opioid dependence (HCC) 09/10/2019  . Hyperlipidemia 09/02/2019  . Abnormal glucose 09/01/2018  . Insomnia secondary to chronic pain 06/23/2018  . Anemia 04/21/2018  . Greater trochanteric bursitis of hips (Bilateral) 06/24/2017  . Osteoarthritis of hips (Bilateral) 06/24/2017  . Chronic generalized pain (Primary Area of Pain) 04/15/2017  . Chronic pain syndrome 04/15/2017  . Long term current use of opiate analgesic 04/15/2017  . Chronic shoulder pain [facility] Area of Pain) (Bilateral) (R>L) 04/15/2017  . Chronic hip pain (Fourth Area of Pain) (Bilateral) (R>L) 04/15/2017  . Cervical spondylosis without myelopathy 02/11/2017  . Fibromyalgia affecting multiple sites (Secondary Area of Pain) 02/11/2017  . Spondylosis of lumbar  spine 02/11/2017  . Medication management 08/30/2014  . Vitamin D deficiency 08/30/2014  . S/P gastric bypass 08/30/2014  . Insomnia 01/01/2014  . Tobacco abuse 10/11/2010  . Anxiety state 05/12/2008  . Essential hypertension 05/12/2008    Junious SilkJakki L Maria Coin, PT 11/06/2019, 1:00 PM  [facility] [address], [address] [place], [place], [address] Phone: [phone]   Fax:  [phone]  Name: Wendy NineLisa A Valencia MRN: [id number] Date of Birth: 12-05-1965

## 2019-11-04 ENCOUNTER — Encounter: Payer: BC Managed Care – PPO | Admitting: Pain Medicine

## 2019-11-06 NOTE — Addendum Note (Signed)
Addended by: Beatris Si on: 11/06/2019 01:02 PM   Modules accepted: Orders

## 2019-11-12 ENCOUNTER — Encounter: Payer: BC Managed Care – PPO | Admitting: Physical Therapy

## 2019-11-16 ENCOUNTER — Other Ambulatory Visit: Payer: Self-pay

## 2019-11-16 ENCOUNTER — Ambulatory Visit: Payer: BC Managed Care – PPO | Attending: Obstetrics and Gynecology | Admitting: Physical Therapy

## 2019-11-16 DIAGNOSIS — R279 Unspecified lack of coordination: Secondary | ICD-10-CM | POA: Diagnosis not present

## 2019-11-16 DIAGNOSIS — M6281 Muscle weakness (generalized): Secondary | ICD-10-CM | POA: Diagnosis not present

## 2019-11-16 NOTE — Therapy (Signed)
[facility] [address] [place], [place], [address] Phone: [phone]   Fax:  [phone]  Physical Therapy Treatment  Patient Details  Name: Wendy Valencia MRN: [id number] Date of Birth: 21-Jul-1965 Referring Provider (PT): Doree Albee, [place]   Encounter Date: 11/16/2019   PT End of Session - 11/16/19 1714    Visit Number 2    Date for PT Re-Evaluation 01/25/20    PT Start Time 1620    PT Stop Time 1703    PT Time Calculation (min) 43 min    Activity Tolerance Patient tolerated treatment well    Behavior During Therapy [facility] for tasks assessed/performed           Past Medical History:  Diagnosis Date  . Allergy   . Anemia   . Anxiety   . Arthritis   . Fibromyalgia   . GERD (gastroesophageal reflux disease)   . Hypertension   . IBS (irritable bowel syndrome)   . Nephrolithiasis   . Plantar fasciitis of right foot    wears boot     Past Surgical History:  Procedure Laterality Date  . BREATH TEK H PYLORI  07/11/2011   Procedure: BREATH TEK H PYLORI;  Surgeon: Mariella Saa, MD;  Location: [facility];  Service: General;  Laterality: N/A;  . CHOLECYSTECTOMY  10/01/2011   Procedure: LAPAROSCOPIC CHOLECYSTECTOMY WITH INTRAOPERATIVE CHOLANGIOGRAM;  Surgeon: Mariella Saa, MD;  Location: [facility];  Service: General;  Laterality: N/A;  . COLONOSCOPY    . GASTRIC ROUX-EN-Y  10/01/2011   Procedure: LAPAROSCOPIC ROUX-EN-Y GASTRIC;  Surgeon: Mariella Saa, MD;  Location: [facility];  Service: General;  Laterality: N/A;  . Left shoulder arthroscopic surgery  yrs ago  . tummy tuck  2005    There were no vitals filed for this visit.   Subjective Assessment - 11/16/19 1708    Subjective Pt states her hip is hurting but not too bad after taking pain medicine.    Pertinent History gastic bypass 2014; tummy tuck 2011    Currently in Pain? Yes    Pain Score 3     Pain Location Hip    Pain  Orientation Right;Lateral    Pain Descriptors / Indicators Aching    Pain Type Chronic pain    Pain Frequency Intermittent    Aggravating Factors  not sure possibly fibro or constipation    Pain Relieving Factors pain medicine helps a little    Multiple Pain Sites No                             [facility] Adult PT Treatment/Exercise - 11/16/19 0001      Self-Care   Other Self-Care Comments  toileting review and intial HEP educated and performed      Exercises   Exercises Lumbar      Lumbar Exercises: Stretches   Double Knee to Chest Stretch Limitations happy baby 3 x 20 sec    Lower Trunk Rotation 3 reps;20 seconds      Manual Therapy   Manual Therapy Myofascial release    Myofascial Release abdominal fascial release colon and rectum abdomina; scar tissue with cup IASTM                  PT Education - 11/16/19 1713    Education Details medbridge and toilet techniques    Person(s) Educated Patient    Methods Explanation;Demonstration;Verbal cues;Handout  Comprehension Returned demonstration;Verbalized understanding            PT Short Term Goals - 11/06/19 1225      PT SHORT TERM GOAL #1   Title independent with initial HEP    Time 4    Period Weeks    Status New    Target Date 11/30/19      PT SHORT TERM GOAL #2   Title ind with toileting techniques    Time 4    Period Weeks    Status New    Target Date 11/30/19      PT SHORT TERM GOAL #3   Title consistently drinking at least 8 glasses of water/day    Time 4    Period Weeks    Status New    Target Date 11/30/19             PT Long Term Goals - 11/06/19 1228      PT LONG TERM GOAL #1   Title Pt reports 60% easier to have a BM    Time 12    Period Weeks    Status New    Target Date 01/25/20      PT LONG TERM GOAL #2   Title ind with advanced HEP    Time 12    Period Weeks    Status New    Target Date 01/25/20      PT LONG TERM GOAL #3   Title Report 50% less  discomfort or pain during intercourse    Time 12    Period Weeks    Status New    Target Date 01/25/20      PT LONG TERM GOAL #4   Title improved ability to activate TrA due to decreased pain and improved strength demonstrated by squat performed with correct technique    Time 12    Period Weeks    Status New    Target Date 01/25/20                 Plan - 11/16/19 1714    Clinical Impression Statement Pt with no progress at first session since eval.  Focused on fascial release of colon and rectum with abdominal techniques, scar tissue using up for stretch on fascia.  Continue per POC at this time.    PT Treatment/Interventions ADLs/Self Care Home Management;Biofeedback;Cryotherapy;Electrical Stimulation;Moist Heat;Therapeutic activities;Therapeutic exercise;Neuromuscular re-education;Patient/family education;Manual techniques;Taping;Passive range of motion;Scar mobilization;Dry needling    PT Next Visit Plan f/u on fascial release; internal assess bulging or sitting with external cues; core strength    PT Home Exercise Plan breathing with BM and toileting techniques; water intake    Consulted and Agree with Plan of Care Patient           Patient will benefit from skilled therapeutic intervention in order to improve the following deficits and impairments:  Pain, Decreased range of motion, Decreased strength, Increased fascial restricitons, Postural dysfunction, Increased muscle spasms, Decreased coordination  Visit Diagnosis: Muscle weakness (generalized)  Unspecified lack of coordination     Problem List Patient Active Problem List   Diagnosis Date Noted  . Pharmacologic therapy 09/10/2019  . Uncomplicated opioid dependence (HCC) 09/10/2019  . Hyperlipidemia 09/02/2019  . Abnormal glucose 09/01/2018  . Insomnia secondary to chronic pain 06/23/2018  . Anemia 04/21/2018  . Greater trochanteric bursitis of hips (Bilateral) 06/24/2017  . Osteoarthritis of hips  (Bilateral) 06/24/2017  . Chronic generalized pain (Primary Area of Pain) 04/15/2017  . Chronic pain syndrome  04/15/2017  . Long term current use of opiate analgesic 04/15/2017  . Chronic shoulder pain [facility] Area of Pain) (Bilateral) (R>L) 04/15/2017  . Chronic hip pain (Fourth Area of Pain) (Bilateral) (R>L) 04/15/2017  . Cervical spondylosis without myelopathy 02/11/2017  . Fibromyalgia affecting multiple sites (Secondary Area of Pain) 02/11/2017  . Spondylosis of lumbar spine 02/11/2017  . Medication management 08/30/2014  . Vitamin D deficiency 08/30/2014  . S/P gastric bypass 08/30/2014  . Insomnia 01/01/2014  . Tobacco abuse 10/11/2010  . Anxiety state 05/12/2008  . Essential hypertension 05/12/2008    Junious Silk, PT 11/16/2019, 5:51 PM  [facility] [address] [place], [place], [address] Phone: [phone]   Fax:  [phone]  Name: NISHI NEISWONGER MRN: [id number] Date of Birth: 29-May-1965

## 2019-11-16 NOTE — Patient Instructions (Addendum)
Toileting Techniques for Bowel Movements (Defecation) Using your belly (abdomen) and pelvic floor muscles to have a bowel movement is usually instinctive.  Sometimes people can have problems with these muscles and have to relearn proper defecation (emptying) techniques.  If you have weakness in your muscles, organs that are falling out, decreased sensation in your pelvis, or ignore your urge to go, you may find yourself straining to have a bowel movement.  You are straining if you are: . holding your breath or taking in a huge gulp of air and holding it  . keeping your lips and jaw tensed and closed tightly . turning red in the face because of excessive pushing or forcing . developing or worsening your  hemorrhoids . getting faint while pushing . not emptying completely and have to defecate many times a day  If you are straining, you are actually making it harder for yourself to have a bowel movement.  Many people find they are pulling up with the pelvic floor muscles and closing off instead of opening the anus. Due to lack pelvic floor relaxation and coordination the abdominal muscles, one has to work harder to push the feces out.  Many people have never been taught how to defecate efficiently and effectively.  Notice what happens to your body when you are having a bowel movement.  While you are sitting on the toilet pay attention to the following areas: . Jaw and mouth position . Angle of your hips   . Whether your feet touch the ground or not . Arm placement  . Spine position . Waist . Belly tension . Anus (opening of the anal canal)  An Evacuation/Defecation Plan   Here are the 4 basic points:  1. Lean forward enough for your elbows to rest on your knees 2. Support your feet on the floor or use a low stool if your feet don't touch the floor  3. Push out your belly as if you have swallowed a beach ball-you should feel a widening of your waist 4. Open and relax your pelvic floor muscles,  rather than tightening around the anus      The following conditions my require modifications to your toileting posture:  . If you have had surgery in the past that limits your back, hip, pelvic, knee or ankle flexibility . Constipation   Your healthcare practitioner may make the following additional suggestions and adjustments:  1) Sit on the toilet  a) Make sure your feet are supported. b) Notice your hip angle and spine position-most people find it effective to lean forward or raise their knees, which can help the muscles around the anus to relax  c) When you lean forward, place your forearms on your thighs for support  2) Relax suggestions a) Breath deeply in through your nose and out slowly through your mouth as if you are smelling the flowers and blowing out the candles. b) To become aware of how to relax your muscles, contracting and releasing muscles can be helpful.  Pull your pelvic floor muscles in tightly by using the image of holding back gas, or closing around the anus (visualize making a circle smaller) and lifting the anus up and in.  Then release the muscles and your anus should drop down and feel open. Repeat 5 times ending with the feeling of relaxation. c) Keep your pelvic floor muscles relaxed; let your belly bulge out. d) The digestive tract starts at the mouth and ends at the anal opening, so be   sure to relax both ends of the tube.  Place your tongue on the roof of your mouth with your teeth separated.  This helps relax your mouth and will help to relax the anus at the same time.  3) Empty (defecation) a) Keep your pelvic floor and sphincter relaxed, then bulge your anal muscles.  Make the anal opening wide.  b) Stick your belly out as if you have swallowed a beach ball. c) Make your belly wall hard using your belly muscles while continuing to breathe. Doing this makes it easier to open your anus. d) Breath out and give a grunt (or try using other sounds such as  ahhhh, shhhhh, ohhhh or grrrrrrr).  4) Finish a) As you finish your bowel movement, pull the pelvic floor muscles up and in.  This will leave your anus in the proper place rather than remaining pushed out and down. If you leave your anus pushed out and down, it will start to feel as though that is normal and give you incorrect signals about needing to have a bowel movement.   [facility] [address] [place], [place] [address]  

## 2019-11-17 NOTE — Progress Notes (Signed)
PROVIDER NOTE: Information contained herein reflects review and annotations entered in association with encounter. Interpretation of such information and data should be left to medically-trained personnel. Information provided to patient can be located elsewhere in the medical record under "Patient Instructions". Document created using STT-dictation technology, any transcriptional errors that may result from process are unintentional.    Patient: Wendy Valencia  Service Category: E/M  Provider: Gaspar Cola, MD  DOB: [birth year]  DOS: 11/18/2019  Specialty: Interventional Pain Management  MRN: [id number]  Setting: Ambulatory outpatient  PCP: Vladimir Crofts, PA-C  Type: Established Patient    Referring Provider: Vladimir Crofts, PA-C  Location: Office  Delivery: Face-to-face     HPI  Wendy Valencia, a 54 y.o. year old female, is here today because of her Chronic pain syndrome [G89.4]. Wendy Valencia's primary complain today is Hip Pain (bilateral) and Generalized Body Aches (Fibromyalgia ) Last encounter: My last encounter with her was on 10/16/2019. Pertinent problems: Wendy Valencia has Cervical spondylosis without myelopathy; Fibromyalgia affecting multiple sites (Secondary Area of Pain); Spondylosis of lumbar spine; Chronic generalized pain (Primary Area of Pain); Chronic pain syndrome; Chronic shoulder pain (Tertiary Area of Pain) (Bilateral) (R>L); Chronic hip pain (Fourth Area of Pain) (Bilateral) (R>L); Greater trochanteric bursitis of hips (Bilateral); and Osteoarthritis of hips (Bilateral) on their pertinent problem list. Pain Assessment: Severity of Chronic pain is reported as a 3  (took Tramadol and Gabapentin)/10. Location: Hip (see visit info for additional pain sites.) Left, Right/hip pain down the outer aspect of legs. Onset: More than a month ago. Quality: Discomfort, Constant, Sharp, Aching, Radiating. Timing: Constant. Modifying factor(s): medications. Vitals:   height is 5' 7.5" (1.715 m) and weight is 218 lb 9.6 oz (99.2 kg). Her temporal temperature is 97.9 F (36.6 C). Her blood pressure is 140/80 and her pulse is 85. Her respiration is 16 and oxygen saturation is 100%.   Reason for encounter: medication management.  On 05/28/2019 the patient received a prescription for hydrocodone from her dentist, Carroll Sage Simoncic, DDS, but did Valencia call to report it.  This patient came into the practice on 04/15/2017 and has never taking advantage of any of our interventional therapies, which is our specialty.  Her last UDS was on 12/26/2017 and we will update that today.  The patient indicates doing well with the current medication regimen. No adverse reactions or side effects reported to the medications.  She currently states that she is doing yoga and water aerobics, both of which seem to be helping with her hip pain.  Pharmacotherapy Assessment   Analgesic: Tramadol 50 mg 2 tablets twice daily (200 mg/day of tramadol) MME/day:50m/day.   Monitoring: [place] PMP: PDMP reviewed during this encounter.       Pharmacotherapy: No side-effects or adverse reactions reported. Compliance: No problems identified. Effectiveness: Clinically acceptable.  PJanett Billow RN  11/18/2019  1:53 PM  Sign when Signing Visit Nursing Pain Medication Assessment:  Safety precautions to be maintained throughout the outpatient stay will include: orient to surroundings, keep bed in low position, maintain call bell within reach at all times, provide assistance with transfer out of bed and ambulation.  Medication Inspection Compliance: Wendy Valencia comply with our request to bring her pills to be counted. She was reminded that bringing the medication bottles, even when empty, is a requirement.  Medication: None brought in. Pill/Patch Count: None available to be counted. Bottle Appearance: No container available. Did Valencia bring bottle(s) to appointment.  Filled Date: N/A Last  Medication intake:  Today    UDS:  Summary  Date Value Ref Range Status  11/18/2019 Note  Final    Comment:    ==================================================================== ToxASSURE Select 13 (MW) ==================================================================== Test                             Result       Flag       Units  Drug Present and Declared for Prescription Verification   Tramadol                       >4854        EXPECTED   ng/mg creat   O-Desmethyltramadol            >4854        EXPECTED   ng/mg creat   N-Desmethyltramadol            >4854        EXPECTED   ng/mg creat    Source of tramadol is a prescription medication. O-desmethyltramadol    and N-desmethyltramadol are expected metabolites of tramadol.  Drug Absent but Declared for Prescription Verification   Hydrocodone                    Valencia Detected UNEXPECTED ng/mg creat ==================================================================== Test                      Result    Flag   Units      Ref Range   Creatinine              103              mg/dL      >=20 ==================================================================== Declared Medications:  The flagging and interpretation on this report are based on the  following declared medications.  Unexpected results may arise from  inaccuracies in the declared medications.   **Note: The testing scope of this panel includes these medications:   Hydrocodone (Hycodan)  Tramadol (Ultram)   **Note: The testing scope of this panel does Valencia include the  following reported medications:   Acetaminophen  Albuterol  Aspirin  Betamethasone  Caffeine  Cetirizine (Zyrtec-D)  Chondroitin  Diphenhydramine (Benadryl)  Duloxetine (Cymbalta)  Gabapentin (Neurontin)  Glucosamine  Homatropine (Hycodan)  Hydrochlorothiazide (Zestoretic)  Insulin  Iron  Liraglutide (Saxenda)  Lisinopril (Zestoretic)  Multivitamin  Plecanatide (Trulance)  Pseudoephedrine  (Zyrtec-D)  Quetiapine (Seroquel)  Sennosides (Senna)  Supplement  Valacyclovir (Valtrex)  Vitamin D2 (Drisdol) ==================================================================== For clinical consultation, please call [phone]. ====================================================================      ROS  Constitutional: Denies any fever or chills Gastrointestinal: No reported hemesis, hematochezia, vomiting, or acute GI distress Musculoskeletal: Denies any acute onset joint swelling, redness, loss of ROM, or weakness Neurological: No reported episodes of acute onset apraxia, aphasia, dysarthria, agnosia, amnesia, paralysis, loss of coordination, or loss of consciousness  Medication Review  Albuterol Sulfate, Aspirin-Acetaminophen-Caffeine, DULoxetine, Glucosamine-Chondroitin, HYDROcodone-homatropine, Insulin Pen Needle, Liraglutide -Weight Management, OVER THE COUNTER MEDICATION, Plecanatide, QUEtiapine, Senna, Valerian, Vitamin D (Ergocalciferol), betamethasone dipropionate, cetirizine-pseudoephedrine, diphenhydrAMINE, ferrous sulfate, gabapentin, lisinopril-hydrochlorothiazide, multivitamin with minerals, traMADol, and valACYclovir  History Review  Allergy: Wendy Valencia has No Known Allergies. Drug: Wendy Valencia  reports no history of drug use. Alcohol:  reports current alcohol use of about 1.0 standard drink of alcohol per week. Tobacco:  reports that she has been smoking cigarettes. She  has a 1.75 pack-year smoking history. She has never used smokeless tobacco. Social: Wendy Valencia  reports that she has been smoking cigarettes. She has a 1.75 pack-year smoking history. She has never used smokeless tobacco. She reports current alcohol use of about 1.0 standard drink of alcohol per week. She reports that she does Valencia use drugs. Medical:  has a past medical history of Allergy, Anemia, Anxiety, Arthritis, Fibromyalgia, GERD (gastroesophageal reflux disease), Hypertension,  IBS (irritable bowel syndrome), Nephrolithiasis, and Plantar fasciitis of right foot. Surgical: Wendy Valencia  has a past surgical history that includes Left shoulder arthroscopic surgery (yrs ago); tummy tuck (2005); Breath tek h pylori (07/11/2011); Gastric Roux-En-Y (10/01/2011); Cholecystectomy (10/01/2011); and Colonoscopy. Family: family history includes Breast cancer (age of onset: 70) in her mother; Breast cancer (age of onset: 30) in her maternal aunt; Cancer in her maternal grandmother; Cancer (age of onset: 54) in her mother; Heart attack in her maternal grandfather and paternal grandfather; Heart disease in her father and maternal grandfather; Hypertension in her father; Lung cancer in her maternal grandmother; Multiple sclerosis in her maternal uncle; Obesity in her brother; Stroke in her father.  Laboratory Chemistry Profile   Renal Lab Results  Component Value Date   BUN 13 09/03/2019   CREATININE 0.69 09/03/2019   LABCREA 82 16/10/9602   BCR Valencia APPLICABLE 54/09/8117   GFRAA 115 09/03/2019   GFRNONAA 99 09/03/2019     Hepatic Lab Results  Component Value Date   AST 16 09/03/2019   ALT 14 09/03/2019   ALBUMIN 4.5 03/05/2016   ALKPHOS 123 (H) 03/05/2016     Electrolytes Lab Results  Component Value Date   NA 138 09/03/2019   K 4.2 09/03/2019   CL 103 09/03/2019   CALCIUM 9.7 09/03/2019   MG 2.4 09/03/2019     Bone Lab Results  Component Value Date   VD25OH 52 09/03/2019     Inflammation (CRP: Acute Phase) (ESR: Chronic Phase) Lab Results  Component Value Date   CRP 0.9 03/04/2017   ESRSEDRATE 22 (H) 03/04/2017       Note: Above Lab results reviewed.  Recent Imaging Review  Nocturnal polysomnography with REM behavior disorder Dohmeier, Asencion Partridge, MD     06/23/2018  8:36 AM PATIENT'S NAME:  Leotis Shames DOB:      06/14/2017      MR#:    [id number]     DATE OF RECORDING: 06/13/2018  Farrel Demark REFERRING M.D.:  Vicie Mutters PA-C Study Performed:    Baseline Polysomnogram HISTORY:  --- Sleep and medical history: had a sleep study in  [place], [place] about 10 years ago.  Carries a diagnosis of  morbid obesity with comorbidities, prediabetes,  hypercholesterolemia, hypertension, tobacco abuse, vitamin D  deficiency, she most recently wished to start Cymbalta for the  treatment of fibromyalgia.  She also has a history of irritable bowel syndrome,  gastroesophageal reflux syndrome, arthritis, anemia, allergies,  kidney stones, and plantar fasciitis. The patient has very long  standing Insomnia and carries a diagnosis of Fibromyalgia. Pain  treatment is with Dr. Leeanne Mannan. He treats for hip bursitis. Interestingly, the patient was prescribed phentermine for weight  loss (which can cause insomnia) and was taken off in mid -March  2020 after she lost zero weight since the last visit to primary  care office. She had confessed to stress eating grazing during  the day.  Body mass index is a 38 kg/m2. Hemoglobin A1c was 5.9,  she is pre-diabetic.  She states  her total sleep time with medication and only then  amounts to 4 hours.  She does Valencia suffer from excessive daytime  sleepiness and she does Valencia take naps even if she would try to  nap she could Valencia fall asleep.  The patient endorsed the Epworth Sleepiness Scale at 0 points.  The patient's weight 247 pounds with a height of 68 (inches),  resulting in a BMI of 37.4 kg/m2.The patient's neck circumference  measured 17 inches.  CURRENT MEDICATIONS: none listed by tech   PROCEDURE:  This is a multichannel digital polysomnogram  utilizing the Somnostar 11.2 system.  Electrodes and sensors were  applied and monitored per AASM Specifications.   EEG, EOG, Chin  and Limb EMG, were sampled at 200 Hz.  ECG, Snore and Nasal  Pressure, Thermal Airflow, Respiratory Effort, CPAP Flow and  Pressure, Oximetry was sampled at 50 Hz. Digital video and audio  were recorded.      BASELINE STUDY: Lights  Out was at 22:41 and Lights On at 04:59.   Total recording time (TRT) was 379 minutes, with a total sleep  time (TST) of 352.5 minutes.   The patient's sleep latency was  15.5 minutes.  REM latency was 98.5 minutes.  The sleep  efficiency was 93.0%.     SLEEP ARCHITECTURE: WASO (Wake after sleep onset) was 23.5  minutes.  There were 6.5 minutes in Stage N1, 276 minutes Stage  N2, 43.5 minutes Stage N3 and 26.5 minutes in Stage REM.  The  percentage of Stage N1 was 1.8%, Stage N2 was 78.3%, Stage N3 was  12.3% and Stage R (REM sleep) was 7.5%.   RESPIRATORY ANALYSIS:  There were a total of 17 respiratory  events:  0 obstructive apneas, 0 central apneas and 0 mixed  apneas with a total of 0 apneas and an apnea index (AI) of 0  /hour. There were 17 hypopneas with a hypopnea index of 2.9  /hour. The patient also had 0 respiratory event related arousals  (RERAs).     The total APNEA/HYPOPNEA INDEX (AHI) was 2.9 /hour and the total  RESPIRATORY DISTURBANCE INDEX was 0. 2.9 /hour.  0 events  occurred in REM sleep and 34 events in NREM. The REM AHI was 0. 0  /hour, versus a non-REM AHI of 3.1. The patient spent 278 minutes  of total sleep time in the supine position and 75 minutes in  non-supine. The supine AHI was 2.2 versus a non-supine AHI of  5.6.  OXYGEN SATURATION & C02:  The Wake baseline 02 saturation was  96%, with the lowest being 81%. Time spent below 89% saturation  equaled 12 minutes. The arousals were noted as: 61 were spontaneous, 0 were  associated with PLMs, and 11 were associated with respiratory  events. The patient had a total of 0 Periodic Limb Movements.   The Periodic Limb Movement (PLM) index was 0 and the PLM Arousal  index was 0/hour.  Audio and video analysis did Valencia show any abnormal or unusual  movements, behaviors, phonations or vocalizations. There were  several very brief REM sleep phases before 3.45 AM, and at that  time REM sleep reached almost 28  minutes.  Sleep was Valencia very  fragmented and overall sleep time was excellent.  The patient didn't have bathroom breaks. She slept for 4 hours  supine and only slept on her side for the very last hours. Sleep  efficiency was 93%! Mild Snoring was noted. EKG was in keeping with  normal sinus  rhythm (NSR). Post-study, the patient indicated that sleep was poor, restless  and interrupted.   IMPRESSION:  1. No evidence of Obstructive Sleep Apnea (OSA) - No PLMs, and no  Insomnia- total sleep time was close to 6 hours.   2. Normal EKG.  RECOMMENDATIONS:  1. No need for CPAP titration or other interventional therapy.   There was no organic sleep disorder identified.   I certify that I have reviewed the entire raw data recording  prior to the issuance of this report in accordance with the  Standards of Accreditation of the American Academy of Sleep  Medicine (AASM)  Larey Seat, MD    06-23-2018 Diplomat, American Board of Psychiatry and Neurology  Diplomat, American Board of Sleep Medicine Medical Director, [facility] [place], [place] Note: Reviewed        Physical Exam  General appearance: Well nourished, well developed, and well hydrated. In no apparent acute distress Mental status: Alert, oriented x 3 (person, place, & time)       Respiratory: No evidence of acute respiratory distress Eyes: PERLA Vitals: BP 140/80 (BP Location: Left Arm, Patient Position: Sitting, Cuff Size: Large)   Pulse 85   Temp 97.9 F (36.6 C) (Temporal)   Resp 16   Ht 5' 7.5" (1.715 m)   Wt 218 lb 9.6 oz (99.2 kg)   SpO2 100%   BMI 33.73 kg/m  BMI: Estimated body mass index is 33.73 kg/m as calculated from the following:   Height as of this encounter: 5' 7.5" (1.715 m).   Weight as of this encounter: 218 lb 9.6 oz (99.2 kg). Ideal: Ideal body weight: 62.7 kg (138 lb 5.4 oz) Adjusted ideal body weight: 77.3 kg (170 lb 7.1 oz)  Assessment   Status Diagnosis   Controlled Controlled Controlled 1. Chronic pain syndrome   2. Chronic generalized pain (Primary Area of Pain)   3. Fibromyalgia affecting multiple sites (Secondary Area of Pain)   4. Chronic shoulder pain (Tertiary Area of Pain) (Bilateral) (R>L)   5. Pharmacologic therapy   6. Medication management      Updated Problems: No problems updated.  Plan of Care  Problem-specific:  No problem-specific Assessment & Plan notes found for this encounter.  Ms. Tessi Eustache Monks has a current medication list which includes the following long-term medication(s): duloxetine, duloxetine, gabapentin, lisinopril-hydrochlorothiazide, quetiapine, and tramadol.  Pharmacotherapy (Medications Ordered): Meds ordered this encounter  Medications  . traMADol (ULTRAM) 50 MG tablet    Sig: Take 1 tablet (50 mg total) by mouth every 6 (six) hours as needed for severe pain.    Dispense:  120 tablet    Refill:  0    Chronic Pain: STOP Act (Valencia applicable) Fill 1 day early if closed on refill date. Avoid benzodiazepines within 8 hours of opioids   Orders:  Orders Placed This Encounter  Procedures  . ToxASSURE Select 13 (MW), Urine    Volume: 30 ml(s). Minimum 3 ml of urine is needed. Document temperature of fresh sample. Indications: Long term (current) use of opiate analgesic (I77.824)    Order Specific Question:   Release to patient    Answer:   Immediate   Follow-up plan:   Return in 3 months (on 02/19/2020) for (F2F), (Med Mgmt) to check results of UDS.      Interventional options: Considering: Diagnostic bilateral intra-articular shoulder joint injection Diagnostic bilateral suprascapular nerve block Possible bilateral suprascapular nerve RFA Diagnostic bilateral intra-articular hip  joint injection Diagnostic/therapeutic bilateral trochanteric bursa injection Diagnostic bilateral femoral nerve +obturator nerve block Possible bilateral femoral nerve + obturator nerve  RFA Diagnostic/therapeuticLidocaine infusion Diagnostic/therapeuticTrigger point injection Diagnostic bilateral cervical facet block Diagnostic bilateral cervical facet RFA   Palliative PRN treatment(s): Diagnostic bilateralintra-articularhipjoint injection#2+ bilateral trochanteric bursa injection #2, under fluoroscopic guidance and IV sedation    Recent Visits Date Type Provider Dept  11/18/19 Office Visit Milinda Pointer, MD [facility]  10/22/19 Telemedicine Milinda Pointer, MD [facility]  09/10/19 Telemedicine Milinda Pointer, MD [facility]  Showing recent visits within past 90 days and meeting all other requirements Future Appointments Date Type Provider Dept  02/17/20 Appointment Milinda Pointer, MD [facility]  Showing future appointments within next 90 days and meeting all other requirements  I discussed the assessment and treatment plan with the patient. The patient was provided an opportunity to ask questions and all were answered. The patient agreed with the plan and demonstrated an understanding of the instructions.  Patient advised to call back or seek an in-person evaluation if the symptoms or condition worsens.  Duration of encounter: 30 minutes.  Note by: Gaspar Cola, MD Date: 11/18/2019; Time: 5:39 PM

## 2019-11-18 ENCOUNTER — Other Ambulatory Visit: Payer: Self-pay

## 2019-11-18 ENCOUNTER — Ambulatory Visit: Payer: BC Managed Care – PPO | Attending: Pain Medicine | Admitting: Pain Medicine

## 2019-11-18 ENCOUNTER — Encounter: Payer: Self-pay | Admitting: Pain Medicine

## 2019-11-18 VITALS — BP 140/80 | HR 85 | Temp 97.9°F | Resp 16 | Ht 67.5 in | Wt 218.6 lb

## 2019-11-18 DIAGNOSIS — M25512 Pain in left shoulder: Secondary | ICD-10-CM

## 2019-11-18 DIAGNOSIS — R52 Pain, unspecified: Secondary | ICD-10-CM

## 2019-11-18 DIAGNOSIS — M797 Fibromyalgia: Secondary | ICD-10-CM | POA: Diagnosis not present

## 2019-11-18 DIAGNOSIS — M25511 Pain in right shoulder: Secondary | ICD-10-CM

## 2019-11-18 DIAGNOSIS — G894 Chronic pain syndrome: Secondary | ICD-10-CM

## 2019-11-18 DIAGNOSIS — Z79899 Other long term (current) drug therapy: Secondary | ICD-10-CM | POA: Diagnosis not present

## 2019-11-18 DIAGNOSIS — G8929 Other chronic pain: Secondary | ICD-10-CM

## 2019-11-18 MED ORDER — TRAMADOL HCL 50 MG PO TABS: 50.0000 mg | ORAL_TABLET | Freq: Four times a day (QID) | ORAL | 0 refills | Status: DC | PRN

## 2019-11-18 NOTE — Patient Instructions (Signed)
____________________________________________________________________________________________  Medication Rules  Purpose: To inform patients, and their family members, of our rules and regulations.  Applies to: All patients receiving prescriptions (written or electronic).  Pharmacy of record: Pharmacy where electronic prescriptions will be sent. If written prescriptions are taken to a different pharmacy, please inform the nursing staff. The pharmacy listed in the electronic medical record should be the one where you would like electronic prescriptions to be sent.  Electronic prescriptions: In compliance with the [place] Strengthen Opioid Misuse Prevention (STOP) Act of 2017 (Session Law 2017-74/H243), effective January 15, 2018, all controlled substances must be electronically prescribed. Calling prescriptions to the pharmacy will cease to exist.  Prescription refills: Only during scheduled appointments. Applies to all prescriptions.  NOTE: The following applies primarily to controlled substances (Opioid* Pain Medications).   Type of encounter (visit): For patients receiving controlled substances, face-to-face visits are required. (Not an option or up to the patient.)  Patient's responsibilities: 1. Pain Pills: Bring all pain pills to every appointment (except for procedure appointments). 2. Pill Bottles: Bring pills in original pharmacy bottle. Always bring the newest bottle. Bring bottle, even if empty. 3. Medication refills: You are responsible for knowing and keeping track of what medications you take and those you need refilled. The day before your appointment: write a list of all prescriptions that need to be refilled. The day of the appointment: give the list to the admitting nurse. Prescriptions will be written only during appointments. No prescriptions will be written on procedure days. If you forget a medication: it will not be "Called in", "Faxed", or "electronically sent".  You will need to get another appointment to get these prescribed. No early refills. Do not call asking to have your prescription filled early. 4. Prescription Accuracy: You are responsible for carefully inspecting your prescriptions before leaving our office. Have the discharge nurse carefully go over each prescription with you, before taking them home. Make sure that your name is accurately spelled, that your address is correct. Check the name and dose of your medication to make sure it is accurate. Check the number of pills, and the written instructions to make sure they are clear and accurate. Make sure that you are given enough medication to last until your next medication refill appointment. 5. Taking Medication: Take medication as prescribed. When it comes to controlled substances, taking less pills or less frequently than prescribed is permitted and encouraged. Never take more pills than instructed. Never take medication more frequently than prescribed.  6. Inform other Doctors: Always inform, all of your healthcare providers, of all the medications you take. 7. Pain Medication from other Providers: You are not allowed to accept any additional pain medication from any other Doctor or Healthcare provider. There are two exceptions to this rule. (see below) In the event that you require additional pain medication, you are responsible for notifying us, as stated below. 8. Medication Agreement: You are responsible for carefully reading and following our Medication Agreement. This must be signed before receiving any prescriptions from our practice. Safely store a copy of your signed Agreement. Violations to the Agreement will result in no further prescriptions. (Additional copies of our Medication Agreement are available upon request.) 9. Laws, Rules, & Regulations: All patients are expected to follow all Federal and State Laws, Statutes, Rules, & Regulations. Ignorance of the Laws does not constitute a  valid excuse.  10. Illegal drugs and Controlled Substances: The use of illegal substances (including, but not limited to marijuana and its   derivatives) and/or the illegal use of any controlled substances is strictly prohibited. Violation of this rule may result in the immediate and permanent discontinuation of any and all prescriptions being written by our practice. The use of any illegal substances is prohibited. 11. Adopted CDC guidelines & recommendations: Target dosing levels will be at or below 60 MME/day. Use of benzodiazepines** is not recommended.  Exceptions: There are only two exceptions to the rule of not receiving pain medications from other Healthcare Providers. 1. Exception #1 (Emergencies): In the event of an emergency (i.e.: accident requiring emergency care), you are allowed to receive additional pain medication. However, you are responsible for: As soon as you are able, call our office [phone], at any time of the day or night, and leave a message stating your name, the date and nature of the emergency, and the name and dose of the medication prescribed. In the event that your call is answered by a member of our staff, make sure to document and save the date, time, and the name of the person that took your information.  2. Exception #2 (Planned Surgery): In the event that you are scheduled by another doctor or dentist to have any type of surgery or procedure, you are allowed (for a period no longer than 30 days), to receive additional pain medication, for the acute post-op pain. However, in this case, you are responsible for picking up a copy of our "Post-op Pain Management for Surgeons" handout, and giving it to your surgeon or dentist. This document is available at our office, and does not require an appointment to obtain it. Simply go to our office during business hours (Monday-Thursday from 8:00 AM to 4:00 PM) (Friday 8:00 AM to 12:00 Noon) or if you have a scheduled appointment  with us, prior to your surgery, and ask for it by name. In addition, you are responsible for: calling our office [phone], at any time of the day or night, and leaving a message stating your name, name of your surgeon, type of surgery, and date of procedure or surgery. Failure to comply with your responsibilities may result in termination of therapy involving the controlled substances.  *Opioid medications include: morphine, codeine, oxycodone, oxymorphone, hydrocodone, hydromorphone, meperidine, tramadol, tapentadol, buprenorphine, fentanyl, methadone. **Benzodiazepine medications include: diazepam (Valium), alprazolam (Xanax), clonazepam (Klonopine), lorazepam (Ativan), clorazepate (Tranxene), chlordiazepoxide (Librium), estazolam (Prosom), oxazepam (Serax), temazepam (Restoril), triazolam (Halcion) (Last updated: 09/22/2019) ____________________________________________________________________________________________   ____________________________________________________________________________________________  Medication Recommendations and Reminders  Applies to: All patients receiving prescriptions (written and/or electronic).  Medication Rules & Regulations: These rules and regulations exist for your safety and that of others. They are not flexible and neither are we. Dismissing or ignoring them will be considered "non-compliance" with medication therapy, resulting in complete and irreversible termination of such therapy. (See document titled "Medication Rules" for more details.) In all conscience, because of safety reasons, we cannot continue providing a therapy where the patient does not follow instructions.  Pharmacy of record:   Definition: This is the pharmacy where your electronic prescriptions will be sent.   We do not endorse any particular pharmacy, however, we have experienced problems with [facility] not securing enough medication supply for the community.  We do not  restrict you in your choice of pharmacy. However, once we write for your prescriptions, we will NOT be re-sending more prescriptions to fix restricted supply problems created by your pharmacy, or your insurance.   The pharmacy listed in the electronic medical record should be the   one where you want electronic prescriptions to be sent.  If you choose to change pharmacy, simply notify our nursing staff.  Recommendations:  Keep all of your pain medications in a safe place, under lock and key, even if you live alone. We will NOT replace lost, stolen, or damaged medication.  After you fill your prescription, take 1 week's worth of pills and put them away in a safe place. You should keep a separate, properly labeled bottle for this purpose. The remainder should be kept in the original bottle. Use this as your primary supply, until it runs out. Once it's gone, then you know that you have 1 week's worth of medicine, and it is time to come in for a prescription refill. If you do this correctly, it is unlikely that you will ever run out of medicine.  To make sure that the above recommendation works, it is very important that you make sure your medication refill appointments are scheduled at least 1 week before you run out of medicine. To do this in an effective manner, make sure that you do not leave the office without scheduling your next medication management appointment. Always ask the nursing staff to show you in your prescription , when your medication will be running out. Then arrange for the receptionist to get you a return appointment, at least 7 days before you run out of medicine. Do not wait until you have 1 or 2 pills left, to come in. This is very poor planning and does not take into consideration that we may need to cancel appointments due to bad weather, sickness, or emergencies affecting our staff.  DO NOT ACCEPT A "Partial Fill": If for any reason your pharmacy does not have enough pills/tablets  to completely fill or refill your prescription, do not allow for a "partial fill". The law allows the pharmacy to complete that prescription within 72 hours, without requiring a new prescription. If they do not fill the rest of your prescription within those 72 hours, you will need a separate prescription to fill the remaining amount, which we will NOT provide. If the reason for the partial fill is your insurance, you will need to talk to the pharmacist about payment alternatives for the remaining tablets, but again, DO NOT ACCEPT A PARTIAL FILL, unless you can trust your pharmacist to obtain the remainder of the pills within 72 hours.  Prescription refills and/or changes in medication(s):   Prescription refills, and/or changes in dose or medication, will be conducted only during scheduled medication management appointments. (Applies to both, written and electronic prescriptions.)  No refills on procedure days. No medication will be changed or started on procedure days. No changes, adjustments, and/or refills will be conducted on a procedure day. Doing so will interfere with the diagnostic portion of the procedure.  No phone refills. No medications will be "called into the pharmacy".  No Fax refills.  No weekend refills.  No Holliday refills.  No after hours refills.  Remember:  Business hours are:  Monday to Thursday 8:00 AM to 4:00 PM Provider's Schedule: Alexya Mcdaris, MD - Appointments are:  Medication management: Monday and Wednesday 8:00 AM to 4:00 PM Procedure day: Tuesday and Thursday 7:30 AM to 4:00 PM Bilal Lateef, MD - Appointments are:  Medication management: Tuesday and Thursday 8:00 AM to 4:00 PM Procedure day: Monday and Wednesday 7:30 AM to 4:00 PM (Last update: 08/05/2019) ____________________________________________________________________________________________   ____________________________________________________________________________________________  CBD  (cannabidiol) WARNING  Applicable to: All individuals currently   taking or considering taking CBD (cannabidiol) and, more important, all patients taking opioid analgesic controlled substances (pain medication). (Example: oxycodone; oxymorphone; hydrocodone; hydromorphone; morphine; methadone; tramadol; tapentadol; fentanyl; buprenorphine; butorphanol; dextromethorphan; meperidine; codeine; etc.)  Legal status: CBD remains a Schedule I drug prohibited for any use. CBD is illegal with one exception. In the [place], CBD has a limited Food and Drug Administration (FDA) approval for the treatment of two specific types of epilepsy disorders. Only one CBD product has been approved by the FDA for this purpose: "Epidiolex". FDA is aware that some companies are marketing products containing cannabis and cannabis-derived compounds in ways that violate the Federal Food, Drug and Cosmetic Act (FD&C Act) and that may put the health and safety of consumers at risk. The FDA, a Federal agency, has not enforced the CBD status since 2018.   Legality: Some manufacturers ship CBD products nationally, which is illegal. Often such products are sold online and are therefore available throughout the country. CBD is openly sold in head shops and health food stores in some states where such sales have not been explicitly legalized. Selling unapproved products with unsubstantiated therapeutic claims is not only a violation of the law, but also can put patients at risk, as these products have not been proven to be safe or effective. Federal illegality makes it difficult to conduct research on CBD.  Reference: "FDA Regulation of Cannabis and Cannabis-Derived Products, Including Cannabidiol (CBD)" - https://www.fda.gov/news-events/public-health-focus/fda-regulation-cannabis-and-cannabis-derived-products-including-cannabidiol-cbd  Warning: CBD is not FDA approved and has not undergo the same manufacturing controls as prescription  drugs.  This means that the purity and safety of available CBD may be questionable. Most of the time, despite manufacturer's claims, it is contaminated with THC (delta-9-tetrahydrocannabinol - the chemical in marijuana responsible for the "HIGH").  When this is the case, the THC contaminant will trigger a positive urine drug screen (UDS) test for Marijuana (carboxy-THC). Because a positive UDS for any illicit substance is a violation of our medication agreement, your opioid analgesics (pain medicine) may be permanently discontinued.  MORE ABOUT CBD  General Information: CBD  is a derivative of the Marijuana (cannabis sativa) plant discovered in 1940. It is one of the 113 identified substances found in Marijuana. It accounts for up to 40% of the plant's extract. As of 2018, preliminary clinical studies on CBD included research for the treatment of anxiety, movement disorders, and pain. CBD is available and consumed in multiple forms, including inhalation of smoke or vapor, as an aerosol spray, and by mouth. It may be supplied as an oil containing CBD, capsules, dried cannabis, or as a liquid solution. CBD is thought not to be as psychoactive as THC (delta-9-tetrahydrocannabinol - the chemical in marijuana responsible for the "HIGH"). Studies suggest that CBD may interact with different biological target receptors in the body, including cannabinoid and other neurotransmitter receptors. As of 2018 the mechanism of action for its biological effects has not been determined.  Side-effects  Adverse reactions: Dry mouth, diarrhea, decreased appetite, fatigue, drowsiness, malaise, weakness, sleep disturbances, and others.  Drug interactions: CBC may interact with other medications such as blood-thinners. (Last update: 08/22/2019) ____________________________________________________________________________________________    

## 2019-11-18 NOTE — Progress Notes (Signed)
Nursing Pain Medication Assessment:  Safety precautions to be maintained throughout the outpatient stay will include: orient to surroundings, keep bed in low position, maintain call bell within reach at all times, provide assistance with transfer out of bed and ambulation.  Medication Inspection Compliance: Ms. Vukelich did not comply with our request to bring her pills to be counted. She was reminded that bringing the medication bottles, even when empty, is a requirement.  Medication: None brought in. Pill/Patch Count: None available to be counted. Bottle Appearance: No container available. Did not bring bottle(s) to appointment. Filled Date: N/A Last Medication intake:  Today

## 2019-11-23 LAB — TOXASSURE SELECT 13 (MW), URINE

## 2019-11-24 ENCOUNTER — Encounter: Payer: BC Managed Care – PPO | Admitting: Physical Therapy

## 2019-11-30 ENCOUNTER — Other Ambulatory Visit: Payer: Self-pay | Admitting: Internal Medicine

## 2019-11-30 MED ORDER — LISINOPRIL-HYDROCHLOROTHIAZIDE 20-12.5 MG PO TABS
ORAL_TABLET | ORAL | 0 refills | Status: DC
Start: 2019-11-30 — End: 2019-12-14

## 2019-12-01 ENCOUNTER — Encounter: Payer: Self-pay | Admitting: Physical Therapy

## 2019-12-01 ENCOUNTER — Other Ambulatory Visit: Payer: Self-pay

## 2019-12-01 ENCOUNTER — Ambulatory Visit: Payer: BC Managed Care – PPO | Admitting: Physical Therapy

## 2019-12-01 DIAGNOSIS — R279 Unspecified lack of coordination: Secondary | ICD-10-CM | POA: Diagnosis not present

## 2019-12-01 DIAGNOSIS — M6281 Muscle weakness (generalized): Secondary | ICD-10-CM | POA: Diagnosis not present

## 2019-12-01 NOTE — Therapy (Addendum)
[facility] [address] [place], [place], [address] Phone: [phone]   Fax:  [phone]  Physical Therapy Treatment  Patient Details  Name: Wendy Valencia MRN: [id number] Date of Birth: [birth year] Referring Provider (PT): Doree Albee, [place]   Encounter Date: 12/01/2019   PT End of Session - 12/01/19 1717    Visit Number 3    Date for PT Re-Evaluation 01/25/20    Authorization Type [employer]    PT Start Time 1620    PT Stop Time 1715    PT Time Calculation (min) 55 min    Activity Tolerance Patient tolerated treatment well    Behavior During Therapy [facility] for tasks assessed/performed           Past Medical History:  Diagnosis Date  . Allergy   . Anemia   . Anxiety   . Arthritis   . Fibromyalgia   . GERD (gastroesophageal reflux disease)   . Hypertension   . IBS (irritable bowel syndrome)   . Nephrolithiasis   . Plantar fasciitis of right foot    wears boot     Past Surgical History:  Procedure Laterality Date  . BREATH TEK H PYLORI  07/11/2011   Procedure: BREATH TEK H PYLORI;  Surgeon: Mariella Saa, MD;  Location: [facility];  Service: General;  Laterality: N/A;  . CHOLECYSTECTOMY  10/01/2011   Procedure: LAPAROSCOPIC CHOLECYSTECTOMY WITH INTRAOPERATIVE CHOLANGIOGRAM;  Surgeon: Mariella Saa, MD;  Location: [facility];  Service: General;  Laterality: N/A;  . COLONOSCOPY    . GASTRIC ROUX-EN-Y  10/01/2011   Procedure: LAPAROSCOPIC ROUX-EN-Y GASTRIC;  Surgeon: Mariella Saa, MD;  Location: [facility];  Service: General;  Laterality: N/A;  . Left shoulder arthroscopic surgery  yrs ago  . tummy tuck  2005    There were no vitals filed for this visit.   Subjective Assessment - 12/01/19 1623    Subjective I am still struggling with water.  I found that leaning forward really far when having a BM helps.    Pertinent History gastic bypass 2014; tummy tuck 2011    Currently in Pain?  No/denies                             [facility] Adult PT Treatment/Exercise - 12/02/19 0001      Self-Care   Other Self-Care Comments  edu on self massage to abdomen; cupping review and help with item selection on [employer]      Manual Therapy   Myofascial Release MFR colon, rectum, diaphragm                    PT Short Term Goals - 12/01/19 1626      PT SHORT TERM GOAL #1   Title independent with initial HEP    Status Achieved      PT SHORT TERM GOAL #2   Title ind with toileting techniques    Status Achieved      PT SHORT TERM GOAL #3   Title consistently drinking at least 8 glasses of water/day    Baseline close to 40 oz    Status On-going             PT Long Term Goals - 11/06/19 1228      PT LONG TERM GOAL #1   Title Pt reports 60% easier to have a BM    Time 12  Period Weeks    Status New    Target Date 01/25/20      PT LONG TERM GOAL #2   Title ind with advanced HEP    Time 12    Period Weeks    Status New    Target Date 01/25/20      PT LONG TERM GOAL #3   Title Report 50% less discomfort or pain during intercourse    Time 12    Period Weeks    Status New    Target Date 01/25/20      PT LONG TERM GOAL #4   Title improved ability to activate TrA due to decreased pain and improved strength demonstrated by squat performed with correct technique    Time 12    Period Weeks    Status New    Target Date 01/25/20                 Plan - 12/02/19 1754    Clinical Impression Statement Pt had improved BM after previous session.  Today's session focused on fascial release and demo to patient how to do at home. Pt is unable to do as effectively on herself but can be helpful as needed between visits.  Pt was also given info on order cup for release to inscision.  She has more swelling on Lt abdomen and more tenderness to that region surround the inscision.  Pt will benefit from skilled PT to continue working on strength and  ability to relax for improved BM.    PT Treatment/Interventions ADLs/Self Care Home Management;Biofeedback;Cryotherapy;Electrical Stimulation;Moist Heat;Therapeutic activities;Therapeutic exercise;Neuromuscular re-education;Patient/family education;Manual techniques;Taping;Passive range of motion;Scar mobilization;Dry needling    PT Next Visit Plan internal soft tissue if tolerated; for relax and assess bulging and lifting to coordinate with core    PT Home Exercise Plan breathing with BM and toileting techniques; water intake    Consulted and Agree with Plan of Care Patient           Patient will benefit from skilled therapeutic intervention in order to improve the following deficits and impairments:  Pain, Decreased range of motion, Decreased strength, Increased fascial restricitons, Postural dysfunction, Increased muscle spasms, Decreased coordination  Visit Diagnosis: Muscle weakness (generalized)  Unspecified lack of coordination     Problem List Patient Active Problem List   Diagnosis Date Noted  . Pharmacologic therapy 09/10/2019  . Uncomplicated opioid dependence (HCC) 09/10/2019  . Hyperlipidemia 09/02/2019  . Abnormal glucose 09/01/2018  . Insomnia secondary to chronic pain 06/23/2018  . Anemia 04/21/2018  . Greater trochanteric bursitis of hips (Bilateral) 06/24/2017  . Osteoarthritis of hips (Bilateral) 06/24/2017  . Chronic generalized pain (Primary Area of Pain) 04/15/2017  . Chronic pain syndrome 04/15/2017  . Long term current use of opiate analgesic 04/15/2017  . Chronic shoulder pain [facility] Area of Pain) (Bilateral) (R>L) 04/15/2017  . Chronic hip pain (Fourth Area of Pain) (Bilateral) (R>L) 04/15/2017  . Cervical spondylosis without myelopathy 02/11/2017  . Fibromyalgia affecting multiple sites (Secondary Area of Pain) 02/11/2017  . Spondylosis of lumbar spine 02/11/2017  . Medication management 08/30/2014  . Vitamin D deficiency 08/30/2014  . S/P  gastric bypass 08/30/2014  . Insomnia 01/01/2014  . Tobacco abuse 10/11/2010  . Anxiety state 05/12/2008  . Essential hypertension 05/12/2008    Junious Silk, PT 12/02/2019, 5:59 PM  [facility] [address] [place], [place], [address] Phone: [phone]   Fax:  [phone]  Name: Wendy Forrer  Valencia MRN: [id number] Date of Birth: 16-Apr-1965

## 2019-12-07 ENCOUNTER — Telehealth: Payer: Self-pay | Admitting: Pain Medicine

## 2019-12-07 ENCOUNTER — Other Ambulatory Visit: Payer: Self-pay | Admitting: Pain Medicine

## 2019-12-07 ENCOUNTER — Other Ambulatory Visit: Payer: Self-pay

## 2019-12-07 DIAGNOSIS — G894 Chronic pain syndrome: Secondary | ICD-10-CM

## 2019-12-07 MED ORDER — TRAMADOL HCL 50 MG PO TABS: 50.0000 mg | ORAL_TABLET | Freq: Four times a day (QID) | ORAL | 2 refills | Status: DC | PRN

## 2019-12-07 MED ORDER — SAXENDA 18 MG/3ML ~~LOC~~ SOPN
PEN_INJECTOR | SUBCUTANEOUS | 2 refills | Status: DC
Start: 2019-12-07 — End: 2019-12-14

## 2019-12-07 NOTE — Telephone Encounter (Signed)
There werent refills ordered at last visit and next visit is not until 3 months as per your order.

## 2019-12-07 NOTE — Telephone Encounter (Signed)
Patient came in on 11-3 for Med Mgmt appt. Her next appt is Feb, per orders from Dr. Dorris Carnes. There was only 1 script of Tramadol sent in which she filled on 11-21-19. There are no refills on this. Should there be refills for 12-6 and 1-6 ? Please let patient know.

## 2019-12-13 NOTE — Progress Notes (Unsigned)
FOLLOW UP 3 MONTHS   Assessment / Plan:   Wendy Valencia was seen today for follow-up.  Diagnoses and all orders for this visit:  Essential hypertension Continue current medications: lisinopril/HCTZ 20/12.61m Monitor blood pressure at home; call if consistently over 130/80 Continue DASH diet.   Reminder to go to the ER if any CP, SOB, nausea, dizziness, severe HA, changes vision/speech, left arm numbness and tingling and jaw pain. -     CBC with Differential/Platelet -     COMPLETE METABOLIC PANEL WITH GFR  Hyperlipidemia, unspecified hyperlipidemia type No medications at this time Monitor blood pressure at home; call if consistently over 130/80 Continue DASH diet.   Reminder to go to the ER if any CP, SOB, nausea, dizziness, severe HA, changes vision/speech, left arm numbness and tingling and jaw pain.  Fibromyalgia affecting multiple sites (Secondary Area of Pain) Chronic pain syndrome Long term current use of opiate analgesic Long term prescription benzodiazepine use Pain management -     DULoxetine (CYMBALTA) 60 MG capsule; Take 1 capsule (60 mg total) by mouth daily. With 347mtablet to equal 9032m-     DULoxetine (CYMBALTA) 30 MG capsule; Take 1 capsule (30 mg total) by mouth daily. Take with 79m55mblet to equal 90mg72mly.   Insomnia secondary to chronic pain  Constipation, unspecified constipation type Managing well with trulance 3mg a8msmooth move tea Has tried linzess in the past - disussed true constipation, does not need BM daily if having soft, good BM's Continue squatty potty, fiber, water  Vitamin D deficiency No supplementation at this time Defer vitamin D level  Morbid obesity (HCC) S/P gastric bypass Abnormal glucose Currently using Saxenda daily Would like to change therapy Rx Wegovy, weekly, start at 0.5mg   77mication management Continued  Needs flu shot -     FLU VACCINE MDCK QUAD W/Preservative  Discussed med's effects and SE's. Screening labs  and tests as requested with regular follow-up as recommended. Over 30 minutes of face to face interview, exam, counseling, chart review, and complex, high level critical decision making was performed this visit.   HPI  54 y.o.7emale  presents for follow up on HTN, HLD, Vitamin D deficiency and weight. She reports overall she is doing well.  She is on Saxenda for weight loss and reports that she feels like she has  Hit a plataue with the medication.  She is on the highest dose at this time.  She wants to talk about modifications to her weigh tloss goals.  She is working 2 week in the home and 2 in the office. Aunt, husband's sister, passed due to COVID. Wendy Valencia blood pressure has been controlled at home,  today their BP is BP: 122/74 She does workout, she is walking. She denies chest pain, shortness of breath, dizziness.   She does smoke, states very rare, 1-2 a day and somedays without any.  CXR 06/2017  She has chronic pain, DJD and follows with pain clinic, Dr. NaveriaLowella Dandynegative sleep study for organic causes, deemed chronic insomnia.   BMI is Body mass index is 33.33 kg/m., she is working on diet and exercise. s/p gastric bypass Sept 2013.  She is also on gabapentin 300 mg TID, seroquel 75 mg QHS, and cymbalta 90mg.  28mis doing well with saxenda but wishes to go up on dose which there is not a higher dose. Denies any injection site complications.   She also states that has constipation from the  medication, linzess does not help. She takes smooth move pills every night, this has helped the most.  She is on a 10 strand probiotic. She will occ have to push on her vaginal vault to help with stool, she has some dyspareunia.    Reports she completed pelvic floor therapy.  She continues to do these at home, not as often as she should.    Wt Readings from Last 3 Encounters:  12/14/19 216 lb (98 kg)  11/18/19 218 lb 9.6 oz (99.2 kg)  09/03/19 213 lb (96.6 kg)    She is not on  cholesterol medication and denies myalgias. Her cholesterol is at goal. The cholesterol last visit was:   Lab Results  Component Value Date   CHOL 141 09/03/2019   HDL 67 09/03/2019   LDLCALC 58 09/03/2019   TRIG 82 09/03/2019   CHOLHDL 2.1 09/03/2019   She has been working on diet and exercise for prediabetes, and denies paresthesia of the feet, polydipsia, polyuria and visual disturbances. Last A1C in the office was:  Lab Results  Component Value Date   HGBA1C 5.7 (H) 09/03/2019   Patient is on Vitamin D supplement.   Lab Results  Component Value Date   VD25OH 52 09/03/2019     She has had elevated alk phos with normal ABD [place], GGT and bone scan in 2001. Has had negative autoimmune workup in the past.    Current Medications:  Current Outpatient Medications on File Prior to Visit  Medication Sig Dispense Refill  . Albuterol Sulfate (PROVENTIL HFA IN) Inhale 108 mcg into the lungs as needed.    . Aspirin-Acetaminophen-Caffeine (EXCEDRIN EXTRA STRENGTH PO) Take 1 tablet by mouth 3 (three) times daily as needed (headache).     . betamethasone dipropionate (DIPROLENE) 0.05 % ointment Apply topically 2 (two) times daily. 30 g 0  . cetirizine-pseudoephedrine (ZYRTEC-D) 5-120 MG tablet Take 1 tablet by mouth every 12 (twelve) hours as needed for allergies.     . diphenhydrAMINE (BENADRYL) 25 MG tablet Take 25-50 mg by mouth 3 (three) times daily as needed for allergies.    . ferrous sulfate 325 (65 FE) MG EC tablet Take 325 mg by mouth every other day.     . Glucosamine-Chondroitin (COSAMIN DS PO) Take 1,500 mg by mouth daily.    Marland Kitchen HYDROcodone-homatropine (HYCODAN) 5-1.5 MG/5ML syrup Take 5 mLs by mouth every 6 (six) hours as needed for cough.    . Multiple Vitamins-Minerals (MULTIVITAMIN WITH MINERALS) tablet Take 1 tablet by mouth daily. .    . NOVOFINE 32G X 6 MM MISC Needs daily for victoza 100 each 5  . OVER THE COUNTER MEDICATION 540 mg.    . Plecanatide (TRULANCE) 3 MG TABS Take 3  mg by mouth daily. 90 tablet 1  . QUEtiapine (SEROQUEL) 25 MG tablet TAKE THREE TABLETS BY MOUTH AT BEDTIME 90 tablet 3  . Senna 30 MG MISC Take 30 mg by mouth daily. 4 tablets once per day    . traMADol (ULTRAM) 50 MG tablet Take 1 tablet (50 mg total) by mouth every 6 (six) hours as needed for severe pain. 120 tablet 2  . valACYclovir (VALTREX) 500 MG tablet Take 1 tablet (500 mg total) by mouth daily as needed (for cold sores). 30 tablet 0  . VALERIAN ROOT PO Take 1,000 mg by mouth.    . Vitamin D, Ergocalciferol, (DRISDOL) 1.25 MG (50000 UNIT) CAPS capsule TAKE ONE CAPSULE BY MOUTH TWICE WEEKLY 8 capsule 3  No current facility-administered medications on file prior to visit.   Health Maintenance:   Immunization History  Administered Date(s) Administered  . Influenza Inj Mdck Quad With Preservative 10/23/2016  . Influenza Split 10/02/2011, 10/16/2013, 11/15/2014  . PFIZER SARS-COV-2 Vaccination 04/03/2019, 04/29/2019  . Pneumococcal Polysaccharide-23 10/02/2011  . Tdap 08/30/2016   Health Maintenance  Topic Date Due  . MAMMOGRAM  03/20/2018  . INFLUENZA VACCINE  08/16/2019  . PAP SMEAR-Modifier  08/30/2021  . COLONOSCOPY  05/14/2022  . TETANUS/TDAP  08/31/2026  . COVID-19 Vaccine  Completed  . Hepatitis C Screening  Completed  . HIV Screening  Completed    Tetanus: 2018 Pneumovax: 2013 Prevnar 13: N/A Flu vaccine: 2018 Shingrix: discussed with patient  LMP: off depo Pap:08/2016 NEG HPV MGM:  03/2016 OVERDUE, Discussed with patinet DEXA: 09/2013  Colonoscopy: 04/2017 Q5 years EGD: 2013 Myoview stress test 10/2010 Sleep study 05/2018  Last Dental Exam: Dr. Quincy Simmonds Last Eye Exam: Dr Randall Hiss DERM Dr. Raquel Sarna, OB/GYN Patient Care Team: Unk Pinto, MD as PCP - General (Internal Medicine) Ladene Artist, MD as Consulting Physician (Gastroenterology) Unk Pinto, MD as Referring Physician (Internal Medicine)  Medical History:  Past Medical History:   Diagnosis Date  . Allergy   . Anemia   . Anxiety   . Arthritis   . Fibromyalgia   . GERD (gastroesophageal reflux disease)   . Hypertension   . IBS (irritable bowel syndrome)   . Nephrolithiasis   . Plantar fasciitis of right foot    wears boot    Allergies No Known Allergies  SURGICAL HISTORY She  has a past surgical history that includes Left shoulder arthroscopic surgery (yrs ago); tummy tuck (2005); Breath tek h pylori (07/11/2011); Gastric Roux-En-Y (10/01/2011); Cholecystectomy (10/01/2011); and Colonoscopy.   FAMILY HISTORY Her family history includes Breast cancer (age of onset: 78) in her mother; Breast cancer (age of onset: 89) in her maternal aunt; Cancer in her maternal grandmother; Cancer (age of onset: 72) in her mother; Heart attack in her maternal grandfather and paternal grandfather; Heart disease in her father and maternal grandfather; Hypertension in her father; Lung cancer in her maternal grandmother; Multiple sclerosis in her maternal uncle; Obesity in her brother; Stroke in her father.   SOCIAL HISTORY She  reports that she has been smoking cigarettes. She has a 1.75 pack-year smoking history. She has never used smokeless tobacco. She reports current alcohol use of about 1.0 standard drink of alcohol per week. She reports that she does not use drugs.   Review of Systems: Review of Systems  Constitutional: Positive for malaise/fatigue. Negative for chills, diaphoresis, fever and weight loss.  Eyes: Negative.   Respiratory: Negative.   Cardiovascular: Negative.   Gastrointestinal: Negative.   Genitourinary: Negative.   Musculoskeletal: Positive for back pain, joint pain, myalgias and neck pain. Negative for falls.  Skin: Negative.   Neurological: Negative.  Negative for weakness.  Psychiatric/Behavioral: Negative.     Physical Exam: Estimated body mass index is 33.33 kg/m as calculated from the following:   Height as of this encounter: 5' 7.5" (1.715 m).    Weight as of this encounter: 216 lb (98 kg). BP 122/74   Pulse 86   Temp (!) 97.3 F (36.3 C)   Ht 5' 7.5" (1.715 m)   Wt 216 lb (98 kg)   SpO2 96%   BMI 33.33 kg/m  General Appearance: Well nourished, in no apparent distress.  Eyes: PERRLA, EOMs, conjunctiva no swelling or erythema, normal fundi  and vessels.  Sinuses: No Frontal/maxillary tenderness  ENT/Mouth: Ext aud canals clear, normal light reflex with TMs without erythema, bulging. Good dentition. No erythema, swelling, or exudate on post pharynx. Tonsils not swollen or erythematous. Hearing normal.  Neck: Supple, thyroid normal. No bruits  Respiratory: Respiratory effort normal, BS equal bilaterally without rales, rhonchi, wheezing or stridor.  Cardio: RRR without murmurs, rubs or gallops. Brisk peripheral pulses without edema.  Chest: symmetric, with normal excursions and percussion.  Breasts: defer  Abdomen: Soft, nontender, no guarding, rebound, hernias, masses, or organomegaly.  Lymphatics: Non tender without lymphadenopathy.  Genitourinary: defer Musculoskeletal: Full ROM all peripheral extremities,5/5 strength, and normal gait.  Skin: Warm, dry without rashes, lesions, ecchymosis. Neuro: Cranial nerves intact, reflexes equal bilaterally. Normal muscle tone, no cerebellar symptoms. Sensation intact.  Psych: Awake and oriented X 3, normal affect, Insight and Judgment appropriate.     Garnet Sierras, Laqueta Jean, DNP [facility] 12/22/2019  11:07 PM

## 2019-12-14 ENCOUNTER — Ambulatory Visit (INDEPENDENT_AMBULATORY_CARE_PROVIDER_SITE_OTHER): Payer: BC Managed Care – PPO | Admitting: Adult Health Nurse Practitioner

## 2019-12-14 ENCOUNTER — Encounter: Payer: Self-pay | Admitting: Adult Health Nurse Practitioner

## 2019-12-14 ENCOUNTER — Other Ambulatory Visit: Payer: Self-pay

## 2019-12-14 VITALS — BP 122/74 | HR 86 | Temp 97.3°F | Ht 67.5 in | Wt 216.0 lb

## 2019-12-14 DIAGNOSIS — I10 Essential (primary) hypertension: Secondary | ICD-10-CM

## 2019-12-14 DIAGNOSIS — K5900 Constipation, unspecified: Secondary | ICD-10-CM

## 2019-12-14 DIAGNOSIS — Z9884 Bariatric surgery status: Secondary | ICD-10-CM

## 2019-12-14 DIAGNOSIS — G8929 Other chronic pain: Secondary | ICD-10-CM

## 2019-12-14 DIAGNOSIS — Z79891 Long term (current) use of opiate analgesic: Secondary | ICD-10-CM

## 2019-12-14 DIAGNOSIS — Z23 Encounter for immunization: Secondary | ICD-10-CM | POA: Diagnosis not present

## 2019-12-14 DIAGNOSIS — E559 Vitamin D deficiency, unspecified: Secondary | ICD-10-CM

## 2019-12-14 DIAGNOSIS — G4701 Insomnia due to medical condition: Secondary | ICD-10-CM

## 2019-12-14 DIAGNOSIS — E785 Hyperlipidemia, unspecified: Secondary | ICD-10-CM

## 2019-12-14 DIAGNOSIS — M797 Fibromyalgia: Secondary | ICD-10-CM | POA: Diagnosis not present

## 2019-12-14 DIAGNOSIS — G894 Chronic pain syndrome: Secondary | ICD-10-CM | POA: Diagnosis not present

## 2019-12-14 DIAGNOSIS — Z79899 Other long term (current) drug therapy: Secondary | ICD-10-CM

## 2019-12-14 DIAGNOSIS — R7309 Other abnormal glucose: Secondary | ICD-10-CM

## 2019-12-14 MED ORDER — DULOXETINE HCL 60 MG PO CPEP: 60.0000 mg | ORAL_CAPSULE | Freq: Every day | ORAL | 1 refills | Status: DC

## 2019-12-14 MED ORDER — LISINOPRIL-HYDROCHLOROTHIAZIDE 20-12.5 MG PO TABS: ORAL_TABLET | ORAL | 1 refills | Status: DC

## 2019-12-14 MED ORDER — WEGOVY 0.5 MG/0.5ML ~~LOC~~ SOAJ: 0.5000 mg | SUBCUTANEOUS | 0 refills | Status: DC

## 2019-12-14 MED ORDER — DULOXETINE HCL 30 MG PO CPEP: 30.0000 mg | ORAL_CAPSULE | Freq: Every day | ORAL | 0 refills | Status: DC

## 2019-12-15 ENCOUNTER — Ambulatory Visit: Payer: BC Managed Care – PPO | Admitting: Physical Therapy

## 2019-12-15 ENCOUNTER — Encounter: Payer: BC Managed Care – PPO | Admitting: Physical Therapy

## 2019-12-15 ENCOUNTER — Encounter: Payer: Self-pay | Admitting: Physical Therapy

## 2019-12-15 ENCOUNTER — Other Ambulatory Visit: Payer: Self-pay

## 2019-12-15 DIAGNOSIS — M6281 Muscle weakness (generalized): Secondary | ICD-10-CM

## 2019-12-15 DIAGNOSIS — R279 Unspecified lack of coordination: Secondary | ICD-10-CM

## 2019-12-15 LAB — CBC WITH DIFFERENTIAL/PLATELET
Absolute Monocytes: 631 cells/uL (ref 200–950)
Basophils Absolute: 30 cells/uL (ref 0–200)
Basophils Relative: 0.5 %
Eosinophils Absolute: 59 cells/uL (ref 15–500)
Eosinophils Relative: 1 %
HCT: 38.5 % (ref 35.0–45.0)
Hemoglobin: 12.8 g/dL (ref 11.7–15.5)
Lymphs Abs: 1935 cells/uL (ref 850–3900)
MCH: 27.8 pg (ref 27.0–33.0)
MCHC: 33.2 g/dL (ref 32.0–36.0)
MCV: 83.7 fL (ref 80.0–100.0)
MPV: 9.8 fL (ref 7.5–12.5)
Monocytes Relative: 10.7 %
Neutro Abs: 3245 cells/uL (ref 1500–7800)
Neutrophils Relative %: 55 %
Platelets: 381 10*3/uL (ref 140–400)
RBC: 4.6 10*6/uL (ref 3.80–5.10)
RDW: 14.2 % (ref 11.0–15.0)
Total Lymphocyte: 32.8 %
WBC: 5.9 10*3/uL (ref 3.8–10.8)

## 2019-12-15 LAB — COMPLETE METABOLIC PANEL WITH GFR
AG Ratio: 1.6 (calc) (ref 1.0–2.5)
ALT: 12 U/L (ref 6–29)
AST: 14 U/L (ref 10–35)
Albumin: 4.3 g/dL (ref 3.6–5.1)
Alkaline phosphatase (APISO): 163 U/L — ABNORMAL HIGH (ref 37–153)
BUN: 15 mg/dL (ref 7–25)
CO2: 27 mmol/L (ref 20–32)
Calcium: 9.4 mg/dL (ref 8.6–10.4)
Chloride: 106 mmol/L (ref 98–110)
Creat: 0.71 mg/dL (ref 0.50–1.05)
GFR, Est African American: 113 mL/min/{1.73_m2} (ref >=60)
GFR, Est Non African American: 97 mL/min/{1.73_m2} (ref >=60)
Globulin: 2.7 g/dL (calc) (ref 1.9–3.7)
Glucose, Bld: 82 mg/dL (ref 65–99)
Potassium: 4.1 mmol/L (ref 3.5–5.3)
Sodium: 141 mmol/L (ref 135–146)
Total Bilirubin: 0.3 mg/dL (ref 0.2–1.2)
Total Protein: 7 g/dL (ref 6.1–8.1)

## 2019-12-15 NOTE — Therapy (Signed)
[facility] [address], [address] [place], [place], [address] Phone: [phone]   Fax:  [phone]  Physical Therapy Treatment  Patient Details  Name: Wendy Valencia MRN: [id number] Date of Birth: 05-05-1965 Referring Provider (PT): Doree Albee, [place]   Encounter Date: 12/15/2019   PT End of Session - 12/15/19 1714    Visit Number 4    Date for PT Re-Evaluation 01/25/20    Authorization Type BCBS    PT Start Time 1617    PT Stop Time 1709    PT Time Calculation (min) 52 min    Activity Tolerance Patient tolerated treatment well    Behavior During Therapy WFL for tasks assessed/performed           Past Medical History:  Diagnosis Date   Allergy    Anemia    Anxiety    Arthritis    Fibromyalgia    GERD (gastroesophageal reflux disease)    Hypertension    IBS (irritable bowel syndrome)    Nephrolithiasis    Plantar fasciitis of right foot    wears boot     Past Surgical History:  Procedure Laterality Date   BREATH TEK H PYLORI  07/11/2011   Procedure: BREATH TEK H PYLORI;  Surgeon: Mariella Saa, MD;  Location: [facility];  Service: General;  Laterality: N/A;   CHOLECYSTECTOMY  10/01/2011   Procedure: LAPAROSCOPIC CHOLECYSTECTOMY WITH INTRAOPERATIVE CHOLANGIOGRAM;  Surgeon: Mariella Saa, MD;  Location: [facility];  Service: General;  Laterality: N/A;   COLONOSCOPY     GASTRIC ROUX-EN-Y  10/01/2011   Procedure: LAPAROSCOPIC ROUX-EN-Y GASTRIC;  Surgeon: Mariella Saa, MD;  Location: [facility];  Service: General;  Laterality: N/A;   Left shoulder arthroscopic surgery  yrs ago   tummy tuck  2005    There were no vitals filed for this visit.   Subjective Assessment - 12/15/19 1620    Subjective I got the suction cups and used them a little.  I don't know if I am breathing right on the toilet    Pertinent History gastic bypass 2014; tummy tuck 2011    Currently in Pain?  No/denies                             [facility] Adult PT Treatment/Exercise - 12/15/19 0001      Neuro Re-ed    Neuro Re-ed Details  breathing and bulging with internal tactile cues - pt identity confirmed and consent give to perform internal cues rectally for sphincter and puborectalis      Manual Therapy   Myofascial Release DJ junction and stomach motility; motility around abdominal incisions and upper Rt incision with good release                    PT Short Term Goals - 12/01/19 1626      PT SHORT TERM GOAL #1   Title independent with initial HEP    Status Achieved      PT SHORT TERM GOAL #2   Title ind with toileting techniques    Status Achieved      PT SHORT TERM GOAL #3   Title consistently drinking at least 8 glasses of water/day    Baseline close to 40 oz    Status On-going             PT Long Term Goals - 12/15/19 1719  PT LONG TERM GOAL #1   Title Pt reports 60% easier to have a BM    Status On-going      PT LONG TERM GOAL #2   Title ind with advanced HEP    Status On-going      PT LONG TERM GOAL #3   Title Report 50% less discomfort or pain during intercourse    Status On-going      PT LONG TERM GOAL #4   Title improved ability to activate TrA due to decreased pain and improved strength demonstrated by squat performed with correct technique    Status On-going                 Plan - 12/15/19 1714    Clinical Impression Statement Today's session focused on breathing techniques and bulging pelvic floor.  Pt did well with cues internally.  She has a hard time with maintaining abdominal pressure but did better with blowing techniques given.  Pt had improved motility of abdomen at the Rt upper incision.  Pt will benefit from skilled PT to continue to address fascial restrictions around the abdomen and improved core strength for BMs.    PT Treatment/Interventions ADLs/Self Care Home  Management;Biofeedback;Cryotherapy;Electrical Stimulation;Moist Heat;Therapeutic activities;Therapeutic exercise;Neuromuscular re-education;Patient/family education;Manual techniques;Taping;Passive range of motion;Scar mobilization;Dry needling    PT Next Visit Plan f/u on abdominal motility techniques applied to stomach and DJ junction areas    PT Home Exercise Plan breathing with BM and toileting techniques; water intake    Consulted and Agree with Plan of Care Patient           Patient will benefit from skilled therapeutic intervention in order to improve the following deficits and impairments:  Pain, Decreased range of motion, Decreased strength, Increased fascial restricitons, Postural dysfunction, Increased muscle spasms, Decreased coordination  Visit Diagnosis: Muscle weakness (generalized)  Unspecified lack of coordination     Problem List Patient Active Problem List   Diagnosis Date Noted   Pharmacologic therapy 09/10/2019   Uncomplicated opioid dependence (HCC) 09/10/2019   Hyperlipidemia 09/02/2019   Abnormal glucose 09/01/2018   Insomnia secondary to chronic pain 06/23/2018   Anemia 04/21/2018   Greater trochanteric bursitis of hips (Bilateral) 06/24/2017   Osteoarthritis of hips (Bilateral) 06/24/2017   Chronic generalized pain (Primary Area of Pain) 04/15/2017   Chronic pain syndrome 04/15/2017   Long term current use of opiate analgesic 04/15/2017   Chronic shoulder pain [facility] Area of Pain) (Bilateral) (R>L) 04/15/2017   Chronic hip pain (Fourth Area of Pain) (Bilateral) (R>L) 04/15/2017   Cervical spondylosis without myelopathy 02/11/2017   Fibromyalgia affecting multiple sites (Secondary Area of Pain) 02/11/2017   Spondylosis of lumbar spine 02/11/2017   Medication management 08/30/2014   Vitamin D deficiency 08/30/2014   S/P gastric bypass 08/30/2014   Insomnia 01/01/2014   Tobacco abuse 10/11/2010   Anxiety state 05/12/2008    Essential hypertension 05/12/2008    Brayton Caves Adrean Heitz, PT 12/15/2019, 5:23 PM   [facility] [address], [address] [place], [place], [address] Phone: [phone]   Fax:  [phone]  Name: KALIYA SHREINER MRN: [id number] Date of Birth: 12/04/1965

## 2019-12-18 ENCOUNTER — Other Ambulatory Visit: Payer: Self-pay | Admitting: Adult Health

## 2019-12-18 DIAGNOSIS — M797 Fibromyalgia: Secondary | ICD-10-CM

## 2019-12-22 NOTE — Patient Instructions (Signed)
Received influenza vaccination today, right arm.

## 2019-12-31 ENCOUNTER — Telehealth: Payer: Self-pay

## 2019-12-31 NOTE — Telephone Encounter (Signed)
Patient's WEGOVY 0.5mg s/.2ml pen Lamona Curl has been approved by her insurance plan thru 11/23/2019-12/29/2020

## 2020-01-11 ENCOUNTER — Other Ambulatory Visit: Payer: Self-pay | Admitting: Adult Health

## 2020-01-19 ENCOUNTER — Other Ambulatory Visit: Payer: Self-pay

## 2020-01-19 DIAGNOSIS — M797 Fibromyalgia: Secondary | ICD-10-CM

## 2020-01-19 MED ORDER — DULOXETINE HCL 30 MG PO CPEP: 30.0000 mg | ORAL_CAPSULE | Freq: Every day | ORAL | 0 refills | Status: DC

## 2020-01-19 MED ORDER — BETAMETHASONE DIPROPIONATE 0.05 % EX OINT: TOPICAL_OINTMENT | Freq: Two times a day (BID) | CUTANEOUS | 0 refills | Status: DC

## 2020-01-19 MED ORDER — DULOXETINE HCL 60 MG PO CPEP: 60.0000 mg | ORAL_CAPSULE | Freq: Every day | ORAL | 1 refills | Status: DC

## 2020-01-19 MED ORDER — GABAPENTIN 300 MG PO CAPS: ORAL_CAPSULE | ORAL | 0 refills | Status: DC

## 2020-01-20 ENCOUNTER — Ambulatory Visit: Payer: BC Managed Care – PPO | Attending: Obstetrics and Gynecology | Admitting: Physical Therapy

## 2020-01-20 ENCOUNTER — Other Ambulatory Visit: Payer: Self-pay

## 2020-01-20 ENCOUNTER — Encounter: Payer: Self-pay | Admitting: Physical Therapy

## 2020-01-20 DIAGNOSIS — R279 Unspecified lack of coordination: Secondary | ICD-10-CM | POA: Diagnosis not present

## 2020-01-20 DIAGNOSIS — M6281 Muscle weakness (generalized): Secondary | ICD-10-CM | POA: Diagnosis not present

## 2020-01-20 MED ORDER — VALACYCLOVIR HCL 500 MG PO TABS: 500.0000 mg | ORAL_TABLET | Freq: Every day | ORAL | 0 refills | Status: DC | PRN

## 2020-01-20 NOTE — Therapy (Signed)
[facility] [address] [place], [place], [address] Phone: [phone]   Fax:  [phone]  Physical Therapy Treatment  Patient Details  Name: Wendy Valencia MRN: [id number] Date of Birth: November 23, 1965 Referring Provider (PT): Doree Albee, [place]   Encounter Date: 01/20/2020   PT End of Session - 01/20/20 1926    Visit Number 5    Date for PT Re-Evaluation 04/13/20    Authorization Type BCBS    PT Start Time 1624    PT Stop Time 1709    PT Time Calculation (min) 45 min    Activity Tolerance Patient tolerated treatment well    Behavior During Therapy [facility] for tasks assessed/performed           Past Medical History:  Diagnosis Date  . Allergy   . Anemia   . Anxiety   . Arthritis   . Fibromyalgia   . GERD (gastroesophageal reflux disease)   . Hypertension   . IBS (irritable bowel syndrome)   . Nephrolithiasis   . Plantar fasciitis of right foot    wears boot     Past Surgical History:  Procedure Laterality Date  . BREATH TEK H PYLORI  07/11/2011   Procedure: BREATH TEK H PYLORI;  Surgeon: Mariella Saa, MD;  Location: [facility];  Service: General;  Laterality: N/A;  . CHOLECYSTECTOMY  10/01/2011   Procedure: LAPAROSCOPIC CHOLECYSTECTOMY WITH INTRAOPERATIVE CHOLANGIOGRAM;  Surgeon: Mariella Saa, MD;  Location: [facility];  Service: General;  Laterality: N/A;  . COLONOSCOPY    . GASTRIC ROUX-EN-Y  10/01/2011   Procedure: LAPAROSCOPIC ROUX-EN-Y GASTRIC;  Surgeon: Mariella Saa, MD;  Location: [facility];  Service: General;  Laterality: N/A;  . Left shoulder arthroscopic surgery  yrs ago  . tummy tuck  2005    There were no vitals filed for this visit.   Subjective Assessment - 01/20/20 1925    Subjective I still have a lot of bloating and some of the stretches help to get gas out but still not having full BMs.    Pertinent History gastic bypass 2014; tummy tuck 2011    Currently in  Pain? No/denies              [facility] PT Assessment - 01/20/20 0001      Assessment   Medical Diagnosis K59.00 (ICD-10-CM) - Constipation, unspecified constipation type; N94.19 (ICD-10-CM) - Dyspareunia due to medical condition in female    Referring Provider (PT) Javier Glazier                         [facility] Adult PT Treatment/Exercise - 01/20/20 0001      Self-Care   Self-Care Posture;Other Self-Care Comments    Posture standing not locking knees decreased lumbar lordosis    Other Self-Care Comments  H2O intake - reviewing importance of this lifestyle change      Lumbar Exercises: Stretches   Double Knee to Chest Stretch Limitations happy baby 3 x 20 sec      Lumbar Exercises: Standing   Other Standing Lumbar Exercises pelvic tilt at wall      Lumbar Exercises: Supine   Pelvic Tilt 5 reps    Bent Knee Raise 10 reps    Dead Bug 10 reps    Dead Bug Limitations LE only                  PT Education - 01/20/20  1925    Education Details Access Code: [email or web address]    Person(s) Educated Patient    Methods Explanation;Demonstration;Handout;Verbal cues;Tactile cues    Comprehension Verbalized understanding;Returned demonstration            PT Short Term Goals - 12/01/19 1626      PT SHORT TERM GOAL #1   Title independent with initial HEP    Status Achieved      PT SHORT TERM GOAL #2   Title ind with toileting techniques    Status Achieved      PT SHORT TERM GOAL #3   Title consistently drinking at least 8 glasses of water/day    Baseline close to 40 oz    Status On-going             PT Long Term Goals - 01/20/20 1935      PT LONG TERM GOAL #1   Title Pt reports 60% easier to have a BM    Baseline a little better    Time 8    Period Weeks    Status On-going    Target Date 03/16/20      PT LONG TERM GOAL #2   Title ind with advanced HEP    Baseline still learning; just began core strengthening    Time 8    Period Weeks     Status On-going    Target Date 03/16/20      PT LONG TERM GOAL #3   Title Report 50% less discomfort or pain during intercourse    Baseline not assessed    Time 8    Period Weeks    Status On-going    Target Date 03/16/20      PT LONG TERM GOAL #4   Title improved ability to activate TrA due to decreased pain and improved strength demonstrated by squat performed with correct technique    Baseline able to begin initial core strengthening    Time 8    Period Weeks    Status On-going    Target Date 03/16/20                 Plan - 01/20/20 1927    Clinical Impression Statement Pt did well with exercises.  She was educated in posture to increase core activation and cues needed to keep form locking out knees and anteriorly rotating pelvis . Pt has a difficult time maintaining correct posture in standing and did better with post pelvic tilt against the wall . Pt was educated in other core strengthening ex's in supine for greater lumbar support and tactile feedback.  Pt was also educated in importance of increased water/fluid intake as she reports only drinking about 32oz of water/fluids daily. Due to progress demonstrated in very few visits she is expected to continue to make progress as long as she can adhere to recommendations that are given.  Pt was able to begin core strengthening but has not had volume of visits expected to be needed to resolve issues.  Pt is recommended to continue skilled PT at this time.    Personal Factors and Comorbidities Comorbidity 3+;Time since onset of injury/illness/exacerbation    Comorbidities weight management surgery; vaginal delivery, fibromyalgia    Rehab Potential Excellent    PT Frequency 1x / week    PT Duration 8 weeks    PT Treatment/Interventions ADLs/Self Care Home Management;Biofeedback;Cryotherapy;Electrical Stimulation;Moist Heat;Therapeutic activities;Therapeutic exercise;Neuromuscular re-education;Patient/family education;Manual  techniques;Taping;Passive range of motion;Scar mobilization;Dry needling    PT Next Visit Plan  f/u on core strength, water intake; progress core strength    PT Home Exercise Plan breathing with BM and toileting techniques; water intake; Access Code: [email or web address]    Consulted and Agree with Plan of Care Patient           Patient will benefit from skilled therapeutic intervention in order to improve the following deficits and impairments:  Pain,Decreased range of motion,Decreased strength,Increased fascial restricitons,Postural dysfunction,Increased muscle spasms,Decreased coordination  Visit Diagnosis: Muscle weakness (generalized)  Unspecified lack of coordination     Problem List Patient Active Problem List   Diagnosis Date Noted  . Pharmacologic therapy 09/10/2019  . Uncomplicated opioid dependence ([place]) 09/10/2019  . Hyperlipidemia 09/02/2019  . Abnormal glucose 09/01/2018  . Insomnia secondary to chronic pain 06/23/2018  . Anemia 04/21/2018  . Greater trochanteric bursitis of hips (Bilateral) 06/24/2017  . Osteoarthritis of hips (Bilateral) 06/24/2017  . Chronic generalized pain (Primary Area of Pain) 04/15/2017  . Chronic pain syndrome 04/15/2017  . Long term current use of opiate analgesic 04/15/2017  . Chronic shoulder pain [facility] Area of Pain) (Bilateral) (R>L) 04/15/2017  . Chronic hip pain (Fourth Area of Pain) (Bilateral) (R>L) 04/15/2017  . Cervical spondylosis without myelopathy 02/11/2017  . Fibromyalgia affecting multiple sites (Secondary Area of Pain) 02/11/2017  . Spondylosis of lumbar spine 02/11/2017  . Medication management 08/30/2014  . Vitamin D deficiency 08/30/2014  . S/P gastric bypass 08/30/2014  . Insomnia 01/01/2014  . Tobacco abuse 10/11/2010  . Anxiety state 05/12/2008  . Essential hypertension 05/12/2008    Jule Ser, PT 01/20/2020, 7:43 PM  [facility] [address], [place], [place], [address] Phone: [phone]   Fax:  [phone]  Name: JHORDYN HOOPINGARNER MRN: [id number] Date of Birth: December 03, 1965

## 2020-01-20 NOTE — Patient Instructions (Addendum)
Access Code: [email or web address] URL: [email or web address] Date: 01/20/2020 Prepared by: Dwana Curd  Exercises Supine Diaphragmatic Breathing - 3 x daily - 7 x weekly - 1 sets - 10 reps Supine Pelvic Floor Stretch - 1 x daily - 7 x weekly - 1 sets - 3 reps - 30 sec hold Supine Lower Trunk Rotation - 1 x daily - 7 x weekly - 1 sets - 10 reps - 5 sec hold Cat-Camel to Child's Pose - 1 x daily - 7 x weekly - 5 reps - 1 sets - 10 sec hold Standing with Back Flat Against Wall - 1 x daily - 7 x weekly - 10 reps - 1 sets - 5 sec hold Supine March with Posterior Pelvic Tilt - 1 x daily - 7 x weekly - 3 sets - 10 reps Hooklying March with Leg Extension - 1 x daily - 7 x weekly - 3 sets - 10 reps

## 2020-01-27 ENCOUNTER — Other Ambulatory Visit: Payer: Self-pay | Admitting: Adult Health Nurse Practitioner

## 2020-01-27 ENCOUNTER — Ambulatory Visit: Payer: BC Managed Care – PPO | Admitting: Physical Therapy

## 2020-01-27 ENCOUNTER — Encounter: Payer: Self-pay | Admitting: Physical Therapy

## 2020-01-27 ENCOUNTER — Other Ambulatory Visit: Payer: Self-pay

## 2020-01-27 DIAGNOSIS — R279 Unspecified lack of coordination: Secondary | ICD-10-CM

## 2020-01-27 DIAGNOSIS — M6281 Muscle weakness (generalized): Secondary | ICD-10-CM

## 2020-01-27 NOTE — Therapy (Addendum)
[facility] [address] [place], [place], [address] Phone: [phone]   Fax:  [phone]  Physical Therapy Treatment  Patient Details  Name: Wendy Valencia MRN: [id number] Date of Birth: 1965-10-30 Referring Provider (PT): Doree Albee, [place]   Encounter Date: 01/27/2020   PT End of Session - 01/27/20 1625    Visit Number 6    Date for PT Re-Evaluation 04/13/20    Authorization Type BCBS    PT Start Time 1620    PT Stop Time 1700    PT Time Calculation (min) 40 min    Activity Tolerance Patient tolerated treatment well    Behavior During Therapy WFL for tasks assessed/performed           Past Medical History:  Diagnosis Date   Allergy    Anemia    Anxiety    Arthritis    Fibromyalgia    GERD (gastroesophageal reflux disease)    Hypertension    IBS (irritable bowel syndrome)    Nephrolithiasis    Plantar fasciitis of right foot    wears boot     Past Surgical History:  Procedure Laterality Date   BREATH TEK H PYLORI  07/11/2011   Procedure: BREATH TEK H PYLORI;  Surgeon: Mariella Saa, MD;  Location: [facility];  Service: General;  Laterality: N/A;   CHOLECYSTECTOMY  10/01/2011   Procedure: LAPAROSCOPIC CHOLECYSTECTOMY WITH INTRAOPERATIVE CHOLANGIOGRAM;  Surgeon: Mariella Saa, MD;  Location: [facility];  Service: General;  Laterality: N/A;   COLONOSCOPY     GASTRIC ROUX-EN-Y  10/01/2011   Procedure: LAPAROSCOPIC ROUX-EN-Y GASTRIC;  Surgeon: Mariella Saa, MD;  Location: [facility];  Service: General;  Laterality: N/A;   Left shoulder arthroscopic surgery  yrs ago   tummy tuck  2005    There were no vitals filed for this visit.   Subjective Assessment - 01/27/20 1622    Subjective Pt states she is having a fibromyalgia flare up.  Pt states she heard mirilax capsule works better and is going to try on Friday.    Currently in Pain? No/denies                              [facility] Adult PT Treatment/Exercise - 01/28/20 0001      Self-Care   Other Self-Care Comments  educated on how to use wedge pillow and where to order recommendation on xtra firm pillow      Lumbar Exercises: Stretches   Other Lumbar Stretch Exercise elevated pelvis on wedge knee to chest      Manual Therapy   Myofascial Release MFR colon, rectum, diaphragm                    PT Short Term Goals - 01/28/20 0928      PT SHORT TERM GOAL #3   Title consistently drinking at least 8 glasses of water/day    Status On-going             PT Long Term Goals - 01/20/20 1935      PT LONG TERM GOAL #1   Title Pt reports 60% easier to have a BM    Baseline a little better    Time 8    Period Weeks    Status On-going    Target Date 03/16/20      PT LONG TERM GOAL #2   Title ind  with advanced HEP    Baseline still learning; just began core strengthening    Time 8    Period Weeks    Status On-going    Target Date 03/16/20      PT LONG TERM GOAL #3   Title Report 50% less discomfort or pain during intercourse    Baseline not assessed    Time 8    Period Weeks    Status On-going    Target Date 03/16/20      PT LONG TERM GOAL #4   Title improved ability to activate TrA due to decreased pain and improved strength demonstrated by squat performed with correct technique    Baseline able to begin initial core strengthening    Time 8    Period Weeks    Status On-going    Target Date 03/16/20                 Plan - 01/28/20 0920    Clinical Impression Statement Pt was feeling better after fascial release last visit.  Pt was educated in stretches with pelvis elevated and she could feel things relaxing more.  Pt had good fascial release around tranverse flexure and descending colon.  Pt will benefit from skilled therapy to continue with coordination and strengthening core along with myofascial to promote bowel movements.    PT  Treatment/Interventions ADLs/Self Care Home Management;Biofeedback;Cryotherapy;Electrical Stimulation;Moist Heat;Therapeutic activities;Therapeutic exercise;Neuromuscular re-education;Patient/family education;Manual techniques;Taping;Passive range of motion;Scar mobilization;Dry needling    PT Next Visit Plan progress core strength as tolerated and continue myofascial release to abdomen    PT Home Exercise Plan breathing with BM and toileting techniques; water intake; Access Code: [email or web address]    Consulted and Agree with Plan of Care Patient           Patient will benefit from skilled therapeutic intervention in order to improve the following deficits and impairments:  Pain,Decreased range of motion,Decreased strength,Increased fascial restricitons,Postural dysfunction,Increased muscle spasms,Decreased coordination  Visit Diagnosis: Muscle weakness (generalized)  Unspecified lack of coordination     Problem List Patient Active Problem List   Diagnosis Date Noted   Pharmacologic therapy 09/10/2019   Uncomplicated opioid dependence ([facility]) 09/10/2019   Hyperlipidemia 09/02/2019   Abnormal glucose 09/01/2018   Insomnia secondary to chronic pain 06/23/2018   Anemia 04/21/2018   Greater trochanteric bursitis of hips (Bilateral) 06/24/2017   Osteoarthritis of hips (Bilateral) 06/24/2017   Chronic generalized pain (Primary Area of Pain) 04/15/2017   Chronic pain syndrome 04/15/2017   Long term current use of opiate analgesic 04/15/2017   Chronic shoulder pain (Tertiary Area of Pain) (Bilateral) (R>L) 04/15/2017   Chronic hip pain (Fourth Area of Pain) (Bilateral) (R>L) 04/15/2017   Cervical spondylosis without myelopathy 02/11/2017   Fibromyalgia affecting multiple sites (Secondary Area of Pain) 02/11/2017   Spondylosis of lumbar spine 02/11/2017   Medication management 08/30/2014   Vitamin D deficiency 08/30/2014   S/P gastric bypass 08/30/2014   Insomnia 01/01/2014    Tobacco abuse 10/11/2010   Anxiety state 05/12/2008   Essential hypertension 05/12/2008    Brayton Caves Jalin Erpelding, PT 01/28/2020, 9:31 AM  [facility] [address], [address] [place], [place], [address] Phone: [phone]   Fax:  [phone]  Name: Wendy Valencia MRN: [id number] Date of Birth: 29-Jun-1965

## 2020-02-02 ENCOUNTER — Other Ambulatory Visit: Payer: Self-pay | Admitting: Adult Health Nurse Practitioner

## 2020-02-02 MED ORDER — WEGOVY 1.7 MG/0.75ML ~~LOC~~ SOAJ: 1.7000 mg | SUBCUTANEOUS | 0 refills | Status: DC

## 2020-02-02 MED ORDER — WEGOVY 1 MG/0.5ML ~~LOC~~ SOAJ: 1.0000 mg | SUBCUTANEOUS | 0 refills | Status: DC

## 2020-02-09 ENCOUNTER — Other Ambulatory Visit: Payer: Self-pay

## 2020-02-09 DIAGNOSIS — M797 Fibromyalgia: Secondary | ICD-10-CM

## 2020-02-09 MED ORDER — LISINOPRIL-HYDROCHLOROTHIAZIDE 20-12.5 MG PO TABS: ORAL_TABLET | ORAL | 1 refills | Status: DC

## 2020-02-09 MED ORDER — DULOXETINE HCL 60 MG PO CPEP: 60.0000 mg | ORAL_CAPSULE | Freq: Every day | ORAL | 1 refills | Status: DC

## 2020-02-09 MED ORDER — VITAMIN D (ERGOCALCIFEROL) 1.25 MG (50000 UNIT) PO CAPS: 50000.0000 [IU] | ORAL_CAPSULE | ORAL | 3 refills | Status: DC

## 2020-02-09 MED ORDER — DULOXETINE HCL 30 MG PO CPEP: 30.0000 mg | ORAL_CAPSULE | Freq: Every day | ORAL | 1 refills | Status: DC

## 2020-02-09 MED ORDER — QUETIAPINE FUMARATE 25 MG PO TABS: 75.0000 mg | ORAL_TABLET | Freq: Every day | ORAL | 3 refills | Status: DC

## 2020-02-15 ENCOUNTER — Other Ambulatory Visit: Payer: Self-pay

## 2020-02-15 DIAGNOSIS — M797 Fibromyalgia: Secondary | ICD-10-CM

## 2020-02-15 MED ORDER — GABAPENTIN 300 MG PO CAPS: ORAL_CAPSULE | ORAL | 0 refills | Status: DC

## 2020-02-15 NOTE — Telephone Encounter (Signed)
REFILL ON GABAPENTIN SENT TO [facility]

## 2020-02-17 ENCOUNTER — Ambulatory Visit: Payer: BC Managed Care – PPO | Attending: Pain Medicine | Admitting: Pain Medicine

## 2020-02-17 ENCOUNTER — Encounter: Payer: Self-pay | Admitting: Pain Medicine

## 2020-02-17 ENCOUNTER — Other Ambulatory Visit: Payer: Self-pay

## 2020-02-17 VITALS — BP 144/85 | HR 77 | Temp 94.1°F | Resp 16 | Ht 67.5 in | Wt 215.0 lb

## 2020-02-17 DIAGNOSIS — M797 Fibromyalgia: Secondary | ICD-10-CM | POA: Diagnosis not present

## 2020-02-17 DIAGNOSIS — M25512 Pain in left shoulder: Secondary | ICD-10-CM

## 2020-02-17 DIAGNOSIS — M25511 Pain in right shoulder: Secondary | ICD-10-CM | POA: Diagnosis not present

## 2020-02-17 DIAGNOSIS — Z79899 Other long term (current) drug therapy: Secondary | ICD-10-CM

## 2020-02-17 DIAGNOSIS — M25551 Pain in right hip: Secondary | ICD-10-CM

## 2020-02-17 DIAGNOSIS — G8929 Other chronic pain: Secondary | ICD-10-CM

## 2020-02-17 DIAGNOSIS — M25552 Pain in left hip: Secondary | ICD-10-CM | POA: Diagnosis not present

## 2020-02-17 DIAGNOSIS — G894 Chronic pain syndrome: Secondary | ICD-10-CM | POA: Diagnosis not present

## 2020-02-17 DIAGNOSIS — R52 Pain, unspecified: Secondary | ICD-10-CM | POA: Diagnosis not present

## 2020-02-17 DIAGNOSIS — F1120 Opioid dependence, uncomplicated: Secondary | ICD-10-CM

## 2020-02-17 MED ORDER — TRAMADOL HCL 50 MG PO TABS: 50.0000 mg | ORAL_TABLET | Freq: Four times a day (QID) | ORAL | 2 refills | Status: DC | PRN

## 2020-02-17 NOTE — Progress Notes (Unsigned)
Nursing Pain Medication Assessment:  Safety precautions to be maintained throughout the outpatient stay will include: orient to surroundings, keep bed in low position, maintain call bell within reach at all times, provide assistance with transfer out of bed and ambulation.  Medication Inspection Compliance: Pill count conducted under aseptic conditions, in front of the patient. Neither the pills nor the bottle was removed from the patient's sight at any time. Once count was completed pills were immediately returned to the patient in their original bottle.  Medication: Tramadol (Ultram) Pill/Patch Count: 108 of 120 pills remain Pill/Patch Appearance: Markings consistent with prescribed medication Bottle Appearance: Standard pharmacy container. Clearly labeled. Filled Date: 01 / 30 / 2022 Last Medication intake:  Today

## 2020-02-17 NOTE — Progress Notes (Signed)
PROVIDER NOTE: Information contained herein reflects review and annotations entered in association with encounter. Interpretation of such information and data should be left to medically-trained personnel. Information provided to patient can be located elsewhere in the medical record under "Patient Instructions". Document created using STT-dictation technology, any transcriptional errors that may result from process are unintentional.    Patient: Wendy Valencia  Service Category: E/M  Provider: Gaspar Cola, MD  DOB: [birth year]  DOS: 02/17/2020  Specialty: Interventional Pain Management  MRN: [id number]  Setting: Ambulatory outpatient  PCP: Unk Pinto, MD  Type: Established Patient    Referring Provider: Vladimir Crofts, PA-C  Location: Office  Delivery: Face-to-face     HPI  Wendy Valencia, a 55 y.o. year old female, is here today because of her Valencia pain syndrome [G89.4]. Wendy Valencia primary complain today is Hip Pain (bilateral) and Fibromyalgia Last encounter: My last encounter with her was on 12/07/2019. Pertinent problems: Wendy Valencia hip pain (Fourth Area of Pain) (Bilateral) (R>L); Greater trochanteric bursitis of hips (Bilateral); and Osteoarthritis of hips (Bilateral) on their pertinent problem list. Pain Assessment: Severity of Valencia pain is reported as a 3 /10. Location: Hip Right,Left/outer thighs. Onset: More than a month ago. Quality: Sharp,Constant. Timing: Constant. Modifying factor(s): medications, TENS, hot baths with epsom salts. Vitals:  height is 5' 7.5" (1.715 m) and weight is 215 lb (97.5 kg). Her temporal temperature is 94.1 F (34.5 C) (abnormal). Her blood  pressure is 144/85 (abnormal) and her pulse is 77. Her respiration is 16 and oxygen saturation is 100%.   Reason for encounter: medication management. The patient indicates doing well with the current medication regimen. No adverse reactions or side effects reported to the medications.   RTCB: 06/18/2020  Pharmacotherapy Assessment   Analgesic: Tramadol 50 mg 2 tablets twice daily (200 mg/day of tramadol) MME/day:24m/day.   Monitoring: [place] PMP: PDMP reviewed during this encounter.       Pharmacotherapy: No side-effects or adverse reactions reported. Compliance: No problems identified. Effectiveness: Clinically acceptable.  SHart Rochester RN  02/17/2020  3:17 PM  Sign when Signing Visit Nursing Pain Medication Assessment:  Safety precautions to be maintained throughout the outpatient stay will include: orient to surroundings, keep bed in low position, maintain call bell within reach at all times, provide assistance with transfer out of bed and ambulation.  Medication Inspection Compliance: Pill count conducted under aseptic conditions, in front of the patient. Neither the pills nor the bottle was removed from the patient's sight at any time. Once count was completed pills were immediately returned to the patient in their original bottle.  Medication: Tramadol (Ultram) Pill/Patch Count: 108 of 120 pills remain Pill/Patch Appearance: Markings consistent with prescribed medication Bottle Appearance: Standard pharmacy container. Clearly labeled. Filled Date: 01 / 30 / 2022 Last Medication intake:  Today    UDS:  Summary  Date Value Ref Range Status  11/18/2019 Note  Final    Comment:    ==================================================================== [employer] Select 13 (MW) ==================================================================== Test                             Result       Flag       Units  Drug  Present and Declared for Prescription Verification   Tramadol                        >4854        EXPECTED   ng/mg creat   O-Desmethyltramadol            >4854        EXPECTED   ng/mg creat   N-Desmethyltramadol            >4854        EXPECTED   ng/mg creat    Source of tramadol is a prescription medication. O-desmethyltramadol    and N-desmethyltramadol are expected metabolites of tramadol.  Drug Absent but Declared for Prescription Verification   Hydrocodone                    Not Detected UNEXPECTED ng/mg creat ==================================================================== Test                      Result    Flag   Units      Ref Range   Creatinine              103              mg/dL      >=20 ==================================================================== Declared Medications:  The flagging and interpretation on this report are based on the  following declared medications.  Unexpected results may arise from  inaccuracies in the declared medications.   **Note: The testing scope of this panel includes these medications:   Hydrocodone (Hycodan)  Tramadol (Ultram)   **Note: The testing scope of this panel does not include the  following reported medications:   Acetaminophen  Albuterol  Aspirin  Betamethasone  Caffeine  Cetirizine (Zyrtec-D)  Chondroitin  Diphenhydramine (Benadryl)  Duloxetine (Cymbalta)  Gabapentin (Neurontin)  Glucosamine  Homatropine (Hycodan)  Hydrochlorothiazide (Zestoretic)  Insulin  Iron  Liraglutide (Saxenda)  Lisinopril (Zestoretic)  Multivitamin  Plecanatide (Trulance)  Pseudoephedrine (Zyrtec-D)  Quetiapine (Seroquel)  Sennosides (Senna)  Supplement  Valacyclovir (Valtrex)  Vitamin D2 (Drisdol) ==================================================================== For clinical consultation, please call [phone]. ====================================================================      ROS  Constitutional: Denies any fever or chills Gastrointestinal: No reported hemesis,  hematochezia, vomiting, or acute GI distress Musculoskeletal: Denies any acute onset joint swelling, redness, loss of ROM, or weakness Neurological: No reported episodes of acute onset apraxia, aphasia, dysarthria, agnosia, amnesia, paralysis, loss of coordination, or loss of consciousness  Medication Review  Albuterol Sulfate, Aspirin-Acetaminophen-Caffeine, DULoxetine, Glucosamine-Chondroitin, Insulin Pen Needle, OVER THE COUNTER MEDICATION, Plecanatide, QUEtiapine, Semaglutide-Weight Management, Senna, Valerian, Vitamin D (Ergocalciferol), betamethasone dipropionate, cetirizine-pseudoephedrine, diphenhydrAMINE, ferrous sulfate, gabapentin, lisinopril-hydrochlorothiazide, multivitamin with minerals, traMADol, and valACYclovir  History Review  Allergy: Ms. Mastropietro has No Known Allergies. Drug: Ms. Arnell  reports no history of drug use. Alcohol:  reports current alcohol use of about 1.0 standard drink of alcohol per week. Tobacco:  reports that she has been smoking cigarettes. She has a 1.75 pack-year smoking history. She has never used smokeless tobacco. Social: Ms. Hackenberg  reports that she has been smoking cigarettes. She has a 1.75 pack-year smoking history. She has never used smokeless tobacco. She reports current alcohol use of about 1.0 standard drink of alcohol per week. She reports that she does not use drugs. Medical:  has a past medical history of Allergy, Anemia, Anxiety, Arthritis, Fibromyalgia, GERD (gastroesophageal reflux disease), Hypertension, IBS (irritable bowel syndrome), Nephrolithiasis, and Plantar fasciitis of right  foot. Surgical: Ms. Mendolia  has a past surgical history that includes Left shoulder arthroscopic surgery (yrs ago); tummy tuck (2005); Breath tek h pylori (07/11/2011); Gastric Roux-En-Y (10/01/2011); Cholecystectomy (10/01/2011); and Colonoscopy. Family: family history includes Breast cancer (age of onset: 67) in her mother; Breast cancer (age of  onset: 18) in her maternal aunt; Cancer in her maternal grandmother; Cancer (age of onset: 22) in her mother; Heart attack in her maternal grandfather and paternal grandfather; Heart disease in her father and maternal grandfather; Hypertension in her father; Lung cancer in her maternal grandmother; Multiple sclerosis in her maternal uncle; Obesity in her brother; Stroke in her father.  Laboratory Chemistry Profile   Renal Lab Results  Component Value Date   BUN 15 12/14/2019   CREATININE 0.71 12/14/2019   LABCREA 82 20/35/5974   BCR NOT APPLICABLE 16/38/4536   GFRAA 113 12/14/2019   GFRNONAA 97 12/14/2019     Hepatic Lab Results  Component Value Date   AST 14 12/14/2019   ALT 12 12/14/2019   ALBUMIN 4.5 03/05/2016   ALKPHOS 123 (H) 03/05/2016     Electrolytes Lab Results  Component Value Date   NA 141 12/14/2019   K 4.1 12/14/2019   CL 106 12/14/2019   CALCIUM 9.4 12/14/2019   MG 2.4 09/03/2019     Bone Lab Results  Component Value Date   VD25OH 52 09/03/2019     Inflammation (CRP: Acute Phase) (ESR: Valencia Phase) Lab Results  Component Value Date   CRP 0.9 03/04/2017   ESRSEDRATE 22 (H) 03/04/2017       Note: Above Lab results reviewed.  Recent Imaging Review  Nocturnal polysomnography with REM behavior disorder Dohmeier, Asencion Partridge, MD     06/23/2018  8:36 AM PATIENT'S NAME:  Leotis Shames DOB:      06/14/2017      MR#:    [id number]     DATE OF RECORDING: 06/13/2018  Farrel Demark REFERRING M.D.:  Vicie Mutters PA-C Study Performed:   Baseline Polysomnogram HISTORY:  --- Sleep and medical history: had a sleep study in  [place], [place] about 10 years ago.  Carries a diagnosis of  morbid obesity with comorbidities, prediabetes,  hypercholesterolemia, hypertension, tobacco abuse, vitamin D  deficiency, she most recently wished to start Cymbalta for the  treatment of fibromyalgia.  She also has a history of irritable bowel syndrome,  gastroesophageal reflux  syndrome, arthritis, anemia, allergies,  kidney stones, and plantar fasciitis. The patient has very long  standing Insomnia and carries a diagnosis of Fibromyalgia. Pain  treatment is with Dr. Leeanne Mannan. He treats for hip bursitis. Interestingly, the patient was prescribed phentermine for weight  loss (which can cause insomnia) and was taken off in mid -March  2020 after she lost zero weight since the last visit to primary  care office. She had confessed to stress eating grazing during  the day.  Body mass index is a 38 kg/m2. Hemoglobin A1c was 5.9,  she is pre-diabetic.  She states her total sleep time with medication and only then  amounts to 4 hours.  She does not suffer from excessive daytime  sleepiness and she does not take naps even if she would try to  nap she could not fall asleep.  The patient endorsed the Epworth Sleepiness Scale at 0 points.  The patient's weight 247 pounds with a height of 68 (inches),  resulting in a BMI of 37.4 kg/m2.The patient's neck circumference  measured 17 inches.  CURRENT MEDICATIONS: none listed  by tech   PROCEDURE:  This is a multichannel digital polysomnogram  utilizing the Somnostar 11.2 system.  Electrodes and sensors were  applied and monitored per AASM Specifications.   EEG, EOG, Chin  and Limb EMG, were sampled at 200 Hz.  ECG, Snore and Nasal  Pressure, Thermal Airflow, Respiratory Effort, CPAP Flow and  Pressure, Oximetry was sampled at 50 Hz. Digital video and audio  were recorded.      BASELINE STUDY: Lights Out was at 22:41 and Lights On at 04:59.   Total recording time (TRT) was 379 minutes, with a total sleep  time (TST) of 352.5 minutes.   The patient's sleep latency was  15.5 minutes.  REM latency was 98.5 minutes.  The sleep  efficiency was 93.0%.     SLEEP ARCHITECTURE: WASO (Wake after sleep onset) was 23.5  minutes.  There were 6.5 minutes in Stage N1, 276 minutes Stage  N2, 43.5 minutes Stage N3 and 26.5 minutes in  Stage REM.  The  percentage of Stage N1 was 1.8%, Stage N2 was 78.3%, Stage N3 was  12.3% and Stage R (REM sleep) was 7.5%.   RESPIRATORY ANALYSIS:  There were a total of 17 respiratory  events:  0 obstructive apneas, 0 central apneas and 0 mixed  apneas with a total of 0 apneas and an apnea index (AI) of 0  /hour. There were 17 hypopneas with a hypopnea index of 2.9  /hour. The patient also had 0 respiratory event related arousals  (RERAs).     The total APNEA/HYPOPNEA INDEX (AHI) was 2.9 /hour and the total  RESPIRATORY DISTURBANCE INDEX was 0. 2.9 /hour.  0 events  occurred in REM sleep and 34 events in NREM. The REM AHI was 0. 0  /hour, versus a non-REM AHI of 3.1. The patient spent 278 minutes  of total sleep time in the supine position and 75 minutes in  non-supine. The supine AHI was 2.2 versus a non-supine AHI of  5.6.  OXYGEN SATURATION & C02:  The Wake baseline 02 saturation was  96%, with the lowest being 81%. Time spent below 89% saturation  equaled 12 minutes. The arousals were noted as: 61 were spontaneous, 0 were  associated with PLMs, and 11 were associated with respiratory  events. The patient had a total of 0 Periodic Limb Movements.   The Periodic Limb Movement (PLM) index was 0 and the PLM Arousal  index was 0/hour.  Audio and video analysis did not show any abnormal or unusual  movements, behaviors, phonations or vocalizations. There were  several very brief REM sleep phases before 3.45 AM, and at that  time REM sleep reached almost 28 minutes.  Sleep was not very  fragmented and overall sleep time was excellent.  The patient didn't have bathroom breaks. She slept for 4 hours  supine and only slept on her side for the very last hours. Sleep  efficiency was 93%! Mild Snoring was noted. EKG was in keeping with normal sinus  rhythm (NSR). Post-study, the patient indicated that sleep was poor, restless  and interrupted.   IMPRESSION:  1. No evidence of  Obstructive Sleep Apnea (OSA) - No PLMs, and no  Insomnia- total sleep time was close to 6 hours.   2. Normal EKG.  RECOMMENDATIONS:  1. No need for CPAP titration or other interventional therapy.   There was no organic sleep disorder identified.   I certify that I have reviewed the entire raw data recording  prior  to the issuance of this report in accordance with the  Standards of Accreditation of the American Academy of Sleep  Medicine (AASM)  Larey Seat, MD    06-23-2018 Diplomat, American Board of Psychiatry and Neurology  Diplomat, American Board of Sleep Medicine Medical Director, [facility] [place], [place] Note: Reviewed        Physical Exam  General appearance: Well nourished, well developed, and well hydrated. In no apparent acute distress Mental status: Alert, oriented x 3 (person, place, & time)       Respiratory: No evidence of acute respiratory distress Eyes: PERLA Vitals: BP (!) 144/85 (BP Location: Left Arm, Patient Position: Sitting, Cuff Size: Large)   Pulse 77   Temp (!) 94.1 F (34.5 C) (Temporal)   Resp 16   Ht 5' 7.5" (1.715 m)   Wt 215 lb (97.5 kg)   SpO2 100%   BMI 33.18 kg/m  BMI: Estimated body mass index is 33.18 kg/m as calculated from the following:   Height as of this encounter: 5' 7.5" (1.715 m).   Weight as of this encounter: 215 lb (97.5 kg). Ideal: Ideal body weight: 62.7 kg (138 lb 5.4 oz) Adjusted ideal body weight: 76.7 kg (169 lb)  Assessment   Status Diagnosis  Controlled Controlled Controlled 1. Valencia pain syndrome   2. Valencia generalized pain (Primary Area of Pain)   3. Fibromyalgia affecting multiple sites (Secondary Area of Pain)   4. Valencia shoulder pain (Tertiary Area of Pain) (Bilateral) (R>L)   5. Valencia hip pain (Fourth Area of Pain) (Bilateral) (R>L)   6. Pharmacologic therapy   7. Uncomplicated opioid dependence ([place])      Updated Problems: No problems updated.  Plan of Care   Problem-specific:  No problem-specific Assessment & Plan notes found for this encounter.  Ms. Tacori Kvamme Gerardo has a current medication list which includes the following long-term medication(s): duloxetine, duloxetine, gabapentin, lisinopril-hydrochlorothiazide, quetiapine, and [START ON 03/20/2020] tramadol.  Pharmacotherapy (Medications Ordered): Meds ordered this encounter  Medications  . traMADol (ULTRAM) 50 MG tablet    Sig: Take 1 tablet (50 mg total) by mouth every 6 (six) hours as needed for severe pain.    Dispense:  120 tablet    Refill:  2    Valencia Pain: STOP Act (Not applicable) Fill 1 day early if closed on refill date. Avoid benzodiazepines within 8 hours of opioids   Orders:  No orders of the defined types were placed in this encounter.  Follow-up plan:   Return in about 4 months (around 06/18/2020) for (F2F), (Med Mgmt).      Interventional options: Considering: Diagnostic bilateral intra-articular shoulder joint injection Diagnostic bilateral suprascapular nerve block Possible bilateral suprascapular nerve RFA Diagnostic bilateral intra-articular hip joint injection Diagnostic/therapeutic bilateral trochanteric bursa injection Diagnostic bilateral femoral nerve +obturator nerve block Possible bilateral femoral nerve + obturator nerve RFA Diagnostic/therapeuticLidocaine infusion Diagnostic/therapeuticTrigger point injection Diagnostic bilateral cervical facet block Diagnostic bilateral cervical facet RFA   Palliative PRN treatment(s): Diagnostic bilateralintra-articularhipjoint injection#2+ bilateral trochanteric bursa injection #2, under fluoroscopic guidance and IV sedation     Recent Visits No visits were found meeting these conditions. Showing recent visits within past 90 days and meeting all other requirements Today's Visits Date Type Provider Dept  02/17/20 Office Visit Milinda Pointer, MD [facility]  Showing  today's visits and meeting all other requirements Future Appointments No visits were found meeting these conditions. Showing future appointments within next 90 days and meeting  all other requirements  I discussed the assessment and treatment plan with the patient. The patient was provided an opportunity to ask questions and all were answered. The patient agreed with the plan and demonstrated an understanding of the instructions.  Patient advised to call back or seek an in-person evaluation if the symptoms or condition worsens.  Duration of encounter: 30 minutes.  Note by: Gaspar Cola, MD Date: 02/17/2020; Time: 3:28 PM

## 2020-02-17 NOTE — Patient Instructions (Signed)
____________________________________________________________________________________________  Medication Rules  Purpose: To inform patients, and their family members, of our rules and regulations.  Applies to: All patients receiving prescriptions (written or electronic).  Pharmacy of record: Pharmacy where electronic prescriptions will be sent. If written prescriptions are taken to a different pharmacy, please inform the nursing staff. The pharmacy listed in the electronic medical record should be the one where you would like electronic prescriptions to be sent.  Electronic prescriptions: In compliance with the [place] Strengthen Opioid Misuse Prevention (STOP) Act of 2017 (Session Law 2017-74/H243), effective January 15, 2018, all controlled substances must be electronically prescribed. Calling prescriptions to the pharmacy will cease to exist.  Prescription refills: Only during scheduled appointments. Applies to all prescriptions.  NOTE: The following applies primarily to controlled substances (Opioid* Pain Medications).   Type of encounter (visit): For patients receiving controlled substances, face-to-face visits are required. (Not an option or up to the patient.)  Patient's responsibilities: 1. Pain Pills: Bring all pain pills to every appointment (except for procedure appointments). 2. Pill Bottles: Bring pills in original pharmacy bottle. Always bring the newest bottle. Bring bottle, even if empty. 3. Medication refills: You are responsible for knowing and keeping track of what medications you take and those you need refilled. The day before your appointment: write a list of all prescriptions that need to be refilled. The day of the appointment: give the list to the admitting nurse. Prescriptions will be written only during appointments. No prescriptions will be written on procedure days. If you forget a medication: it will not be "Called in", "Faxed", or "electronically sent".  You will need to get another appointment to get these prescribed. No early refills. Do not call asking to have your prescription filled early. 4. Prescription Accuracy: You are responsible for carefully inspecting your prescriptions before leaving our office. Have the discharge nurse carefully go over each prescription with you, before taking them home. Make sure that your name is accurately spelled, that your address is correct. Check the name and dose of your medication to make sure it is accurate. Check the number of pills, and the written instructions to make sure they are clear and accurate. Make sure that you are given enough medication to last until your next medication refill appointment. 5. Taking Medication: Take medication as prescribed. When it comes to controlled substances, taking less pills or less frequently than prescribed is permitted and encouraged. Never take more pills than instructed. Never take medication more frequently than prescribed.  6. Inform other Doctors: Always inform, all of your healthcare providers, of all the medications you take. 7. Pain Medication from other Providers: You are not allowed to accept any additional pain medication from any other Doctor or Healthcare provider. There are two exceptions to this rule. (see below) In the event that you require additional pain medication, you are responsible for notifying us, as stated below. 8. Cough Medicine: Often these contain an opioid, such as codeine or hydrocodone. Never accept or take cough medicine containing these opioids if you are already taking an opioid* medication. The combination may cause respiratory failure and death. 9. Medication Agreement: You are responsible for carefully reading and following our Medication Agreement. This must be signed before receiving any prescriptions from our practice. Safely store a copy of your signed Agreement. Violations to the Agreement will result in no further prescriptions.  (Additional copies of our Medication Agreement are available upon request.) 10. Laws, Rules, & Regulations: All patients are expected to follow all   Federal and State Laws, Statutes, Rules, & Regulations. Ignorance of the Laws does not constitute a valid excuse.  11. Illegal drugs and Controlled Substances: The use of illegal substances (including, but not limited to marijuana and its derivatives) and/or the illegal use of any controlled substances is strictly prohibited. Violation of this rule may result in the immediate and permanent discontinuation of any and all prescriptions being written by our practice. The use of any illegal substances is prohibited. 12. Adopted CDC guidelines & recommendations: Target dosing levels will be at or below 60 MME/day. Use of benzodiazepines** is not recommended.  Exceptions: There are only two exceptions to the rule of not receiving pain medications from other Healthcare Providers. 1. Exception #1 (Emergencies): In the event of an emergency (i.e.: accident requiring emergency care), you are allowed to receive additional pain medication. However, you are responsible for: As soon as you are able, call our office [phone], at any time of the day or night, and leave a message stating your name, the date and nature of the emergency, and the name and dose of the medication prescribed. In the event that your call is answered by a member of our staff, make sure to document and save the date, time, and the name of the person that took your information.  2. Exception #2 (Planned Surgery): In the event that you are scheduled by another doctor or dentist to have any type of surgery or procedure, you are allowed (for a period no longer than 30 days), to receive additional pain medication, for the acute post-op pain. However, in this case, you are responsible for picking up a copy of our "Post-op Pain Management for Surgeons" handout, and giving it to your surgeon or dentist. This  document is available at our office, and does not require an appointment to obtain it. Simply go to our office during business hours (Monday-Thursday from 8:00 AM to 4:00 PM) (Friday 8:00 AM to 12:00 Noon) or if you have a scheduled appointment with us, prior to your surgery, and ask for it by name. In addition, you are responsible for: calling our office [phone], at any time of the day or night, and leaving a message stating your name, name of your surgeon, type of surgery, and date of procedure or surgery. Failure to comply with your responsibilities may result in termination of therapy involving the controlled substances.  *Opioid medications include: morphine, codeine, oxycodone, oxymorphone, hydrocodone, hydromorphone, meperidine, tramadol, tapentadol, buprenorphine, fentanyl, methadone. **Benzodiazepine medications include: diazepam (Valium), alprazolam (Xanax), clonazepam (Klonopine), lorazepam (Ativan), clorazepate (Tranxene), chlordiazepoxide (Librium), estazolam (Prosom), oxazepam (Serax), temazepam (Restoril), triazolam (Halcion) (Last updated: 12/14/2019) ____________________________________________________________________________________________   ____________________________________________________________________________________________  Medication Recommendations and Reminders  Applies to: All patients receiving prescriptions (written and/or electronic).  Medication Rules & Regulations: These rules and regulations exist for your safety and that of others. They are not flexible and neither are we. Dismissing or ignoring them will be considered "non-compliance" with medication therapy, resulting in complete and irreversible termination of such therapy. (See document titled "Medication Rules" for more details.) In all conscience, because of safety reasons, we cannot continue providing a therapy where the patient does not follow instructions.  Pharmacy of record:   Definition:  This is the pharmacy where your electronic prescriptions will be sent.   We do not endorse any particular pharmacy, however, we have experienced problems with [facility] not securing enough medication supply for the community.  We do not restrict you in your choice of pharmacy. However,   once we write for your prescriptions, we will NOT be re-sending more prescriptions to fix restricted supply problems created by your pharmacy, or your insurance.   The pharmacy listed in the electronic medical record should be the one where you want electronic prescriptions to be sent.  If you choose to change pharmacy, simply notify our nursing staff.  Recommendations:  Keep all of your pain medications in a safe place, under lock and key, even if you live alone. We will NOT replace lost, stolen, or damaged medication.  After you fill your prescription, take 1 week's worth of pills and put them away in a safe place. You should keep a separate, properly labeled bottle for this purpose. The remainder should be kept in the original bottle. Use this as your primary supply, until it runs out. Once it's gone, then you know that you have 1 week's worth of medicine, and it is time to come in for a prescription refill. If you do this correctly, it is unlikely that you will ever run out of medicine.  To make sure that the above recommendation works, it is very important that you make sure your medication refill appointments are scheduled at least 1 week before you run out of medicine. To do this in an effective manner, make sure that you do not leave the office without scheduling your next medication management appointment. Always ask the nursing staff to show you in your prescription , when your medication will be running out. Then arrange for the receptionist to get you a return appointment, at least 7 days before you run out of medicine. Do not wait until you have 1 or 2 pills left, to come in. This is very poor planning and  does not take into consideration that we may need to cancel appointments due to bad weather, sickness, or emergencies affecting our staff.  DO NOT ACCEPT A "Partial Fill": If for any reason your pharmacy does not have enough pills/tablets to completely fill or refill your prescription, do not allow for a "partial fill". The law allows the pharmacy to complete that prescription within 72 hours, without requiring a new prescription. If they do not fill the rest of your prescription within those 72 hours, you will need a separate prescription to fill the remaining amount, which we will NOT provide. If the reason for the partial fill is your insurance, you will need to talk to the pharmacist about payment alternatives for the remaining tablets, but again, DO NOT ACCEPT A PARTIAL FILL, unless you can trust your pharmacist to obtain the remainder of the pills within 72 hours.  Prescription refills and/or changes in medication(s):   Prescription refills, and/or changes in dose or medication, will be conducted only during scheduled medication management appointments. (Applies to both, written and electronic prescriptions.)  No refills on procedure days. No medication will be changed or started on procedure days. No changes, adjustments, and/or refills will be conducted on a procedure day. Doing so will interfere with the diagnostic portion of the procedure.  No phone refills. No medications will be "called into the pharmacy".  No Fax refills.  No weekend refills.  No Holliday refills.  No after hours refills.  Remember:  Business hours are:  Monday to Thursday 8:00 AM to 4:00 PM Provider's Schedule: Myrick Mcnairy, MD - Appointments are:  Medication management: Monday and Wednesday 8:00 AM to 4:00 PM Procedure day: Tuesday and Thursday 7:30 AM to 4:00 PM Bilal Lateef, MD - Appointments are:    Medication management: Tuesday and Thursday 8:00 AM to 4:00 PM Procedure day: Monday and Wednesday  7:30 AM to 4:00 PM (Last update: 08/05/2019) ____________________________________________________________________________________________   ____________________________________________________________________________________________  CBD (cannabidiol) WARNING  Applicable to: All individuals currently taking or considering taking CBD (cannabidiol) and, more important, all patients taking opioid analgesic controlled substances (pain medication). (Example: oxycodone; oxymorphone; hydrocodone; hydromorphone; morphine; methadone; tramadol; tapentadol; fentanyl; buprenorphine; butorphanol; dextromethorphan; meperidine; codeine; etc.)  Legal status: CBD remains a Schedule I drug prohibited for any use. CBD is illegal with one exception. In the [place], CBD has a limited Food and Drug Administration (FDA) approval for the treatment of two specific types of epilepsy disorders. Only one CBD product has been approved by the FDA for this purpose: "Epidiolex". FDA is aware that some companies are marketing products containing cannabis and cannabis-derived compounds in ways that violate the Federal Food, Drug and Cosmetic Act (FD&C Act) and that may put the health and safety of consumers at risk. The FDA, a Federal agency, has not enforced the CBD status since 2018.   Legality: Some manufacturers ship CBD products nationally, which is illegal. Often such products are sold online and are therefore available throughout the country. CBD is openly sold in head shops and health food stores in some states where such sales have not been explicitly legalized. Selling unapproved products with unsubstantiated therapeutic claims is not only a violation of the law, but also can put patients at risk, as these products have not been proven to be safe or effective. Federal illegality makes it difficult to conduct research on CBD.  Reference: "FDA Regulation of Cannabis and Cannabis-Derived Products, Including Cannabidiol  (CBD)" - https://www.fda.gov/news-events/public-health-focus/fda-regulation-cannabis-and-cannabis-derived-products-including-cannabidiol-cbd  Warning: CBD is not FDA approved and has not undergo the same manufacturing controls as prescription drugs.  This means that the purity and safety of available CBD may be questionable. Most of the time, despite manufacturer's claims, it is contaminated with THC (delta-9-tetrahydrocannabinol - the chemical in marijuana responsible for the "HIGH").  When this is the case, the THC contaminant will trigger a positive urine drug screen (UDS) test for Marijuana (carboxy-THC). Because a positive UDS for any illicit substance is a violation of our medication agreement, your opioid analgesics (pain medicine) may be permanently discontinued.  MORE ABOUT CBD  General Information: CBD  is a derivative of the Marijuana (cannabis sativa) plant discovered in 1940. It is one of the 113 identified substances found in Marijuana. It accounts for up to 40% of the plant's extract. As of 2018, preliminary clinical studies on CBD included research for the treatment of anxiety, movement disorders, and pain. CBD is available and consumed in multiple forms, including inhalation of smoke or vapor, as an aerosol spray, and by mouth. It may be supplied as an oil containing CBD, capsules, dried cannabis, or as a liquid solution. CBD is thought not to be as psychoactive as THC (delta-9-tetrahydrocannabinol - the chemical in marijuana responsible for the "HIGH"). Studies suggest that CBD may interact with different biological target receptors in the body, including cannabinoid and other neurotransmitter receptors. As of 2018 the mechanism of action for its biological effects has not been determined.  Side-effects  Adverse reactions: Dry mouth, diarrhea, decreased appetite, fatigue, drowsiness, malaise, weakness, sleep disturbances, and others.  Drug interactions: CBC may interact with other  medications such as blood-thinners. (Last update: 08/22/2019) ____________________________________________________________________________________________    

## 2020-02-29 ENCOUNTER — Ambulatory Visit: Payer: BC Managed Care – PPO | Admitting: Physical Therapy

## 2020-03-04 ENCOUNTER — Ambulatory Visit
Admission: RE | Admit: 2020-03-04 | Discharge: 2020-03-04 | Disposition: A | Discharge status: Home / Self Care | Payer: BC Managed Care – PPO | Source: Ambulatory Visit | Attending: Internal Medicine | Admitting: Internal Medicine

## 2020-03-04 ENCOUNTER — Other Ambulatory Visit: Payer: Self-pay

## 2020-03-04 DIAGNOSIS — Z1231 Encounter for screening mammogram for malignant neoplasm of breast: Secondary | ICD-10-CM | POA: Diagnosis not present

## 2020-03-09 ENCOUNTER — Ambulatory Visit: Payer: BC Managed Care – PPO | Attending: Obstetrics and Gynecology | Admitting: Physical Therapy

## 2020-03-09 ENCOUNTER — Encounter: Payer: Self-pay | Admitting: Physical Therapy

## 2020-03-09 ENCOUNTER — Other Ambulatory Visit: Payer: Self-pay

## 2020-03-09 DIAGNOSIS — R279 Unspecified lack of coordination: Secondary | ICD-10-CM | POA: Diagnosis not present

## 2020-03-09 DIAGNOSIS — M6281 Muscle weakness (generalized): Secondary | ICD-10-CM | POA: Insufficient documentation
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
## 2020-03-13 ENCOUNTER — Other Ambulatory Visit: Payer: Self-pay | Admitting: Pain Medicine

## 2020-03-13 DIAGNOSIS — G894 Chronic pain syndrome: Secondary | ICD-10-CM

## 2020-03-14 ENCOUNTER — Encounter: Payer: BC Managed Care – PPO | Admitting: Physical Therapy

## 2020-03-24 MED ORDER — WEGOVY 1.7 MG/0.75ML ~~LOC~~ SOAJ
1.7000 mg | SUBCUTANEOUS | 4 refills | Status: DC
Start: 2020-03-24 — End: 2020-03-29

## 2020-03-29 ENCOUNTER — Encounter: Payer: Self-pay | Admitting: Adult Health Nurse Practitioner

## 2020-03-29 ENCOUNTER — Other Ambulatory Visit: Payer: Self-pay | Admitting: Adult Health Nurse Practitioner

## 2020-03-29 DIAGNOSIS — M797 Fibromyalgia: Secondary | ICD-10-CM

## 2020-03-29 MED ORDER — WEGOVY 2.4 MG/0.75ML ~~LOC~~ SOAJ: 2.4000 mg | Freq: Every day | SUBCUTANEOUS | 3 refills | Status: DC

## 2020-03-29 MED ORDER — QUETIAPINE FUMARATE 25 MG PO TABS: 75.0000 mg | ORAL_TABLET | Freq: Every day | ORAL | 3 refills | Status: DC

## 2020-03-29 MED ORDER — GABAPENTIN 300 MG PO CAPS
ORAL_CAPSULE | ORAL | 3 refills | Status: DC
Start: 2020-03-29 — End: 2020-04-12

## 2020-03-29 MED ORDER — SEMAGLUTIDE-WEIGHT MANAGEMENT 2.4 MG/0.75ML ~~LOC~~ SOAJ: 2.4000 mg | SUBCUTANEOUS | 3 refills | Status: DC

## 2020-03-29 MED ORDER — QUETIAPINE FUMARATE 25 MG PO TABS: 75.0000 mg | ORAL_TABLET | Freq: Every day | ORAL | 0 refills | Status: DC

## 2020-03-29 NOTE — Addendum Note (Signed)
Addended by: Elder Negus A on: 03/29/2020 08:37 AM   Modules accepted: Orders

## 2020-04-02 ENCOUNTER — Other Ambulatory Visit: Payer: Self-pay | Admitting: Adult Health Nurse Practitioner

## 2020-04-12 DIAGNOSIS — M797 Fibromyalgia: Secondary | ICD-10-CM

## 2020-04-12 MED ORDER — GABAPENTIN 300 MG PO CAPS: ORAL_CAPSULE | ORAL | 2 refills | Status: DC

## 2020-04-13 MED ORDER — QUETIAPINE FUMARATE 25 MG PO TABS
ORAL_TABLET | ORAL | 2 refills | Status: DC
Start: 2020-04-13 — End: 2021-03-09

## 2020-04-13 NOTE — Addendum Note (Signed)
Addended byElder Negus A on: 04/13/2020 04:28 PM   Modules accepted: Orders

## 2020-05-31 DIAGNOSIS — Z79891 Long term (current) use of opiate analgesic: Secondary | ICD-10-CM | POA: Insufficient documentation

## 2020-05-31 NOTE — Progress Notes (Signed)
PROVIDER NOTE: Information contained herein reflects review and annotations entered in association with encounter. Interpretation of such information and data should be left to medically-trained personnel. Information provided to patient can be located elsewhere in the medical record under "Patient Instructions". Document created using STT-dictation technology, any transcriptional errors that may result from process are unintentional.    Patient: Wendy Valencia  Service Category: E/M  Provider: Gaspar Cola, MD  DOB: 1965/11/01  DOS: 06/01/2020  Specialty: Interventional Pain Management  MRN: [id number]  Setting: Ambulatory outpatient  PCP: Unk Pinto, MD  Type: Established Patient    Referring Provider: Unk Pinto, MD  Location: Office  Delivery: Face-to-face     HPI  Ms. Johni Narine Flaim, a 55 y.o. year old female, is here today because of her Chronic pain syndrome [G89.4]. Ms. Creekmore's primary complain today is Hip Pain and Shoulder Pain Last encounter: My last encounter with her was on 03/13/2020. Pertinent problems: Ms. Craw has Cervical spondylosis without myelopathy; Fibromyalgia affecting multiple sites (2ry area of Pain); Spondylosis of lumbar spine; Chronic generalized pain (1ry area of Pain); Chronic pain syndrome; Chronic shoulder pain (3ry area of Pain) (Bilateral) (R>L); Chronic hip pain (4th area of Pain) (Bilateral) (R>L); Greater trochanteric bursitis of hips (Bilateral); Osteoarthritis of hips (Bilateral); Lumbar facet syndrome (Right); Spondylosis without myelopathy or radiculopathy, lumbosacral region; Chronic hip pain (Left); and Chronic low back pain (Right) w/o sciatica on their pertinent problem list. Pain Assessment: Severity of Chronic pain is reported as a 5 /10. Location: Hip Left,Right/radiates down the side of leg stops around  knee bilateral, left side is worse. Onset: More than a month ago. Quality: Sharp,Radiating,Aching,Discomfort.  Timing: Intermittent. Modifying factor(s): wearing heels make it better, heating pad,standing and streching. Vitals:  height is '5\' 7"'  (1.702 m) and weight is 192 lb (87.1 kg). Her temporal temperature is 97.3 F (36.3 C) (abnormal). Her blood pressure is 148/88 (abnormal) and her pulse is 78. Her respiration is 16 and oxygen saturation is 100%.   Reason for encounter: medication management.   The patient indicates doing well with the current medication regimen. No adverse reactions or side effects reported to the medications.  The patient has lost a significant amount of weight and she is doing much better with regards to that however, she is having left hip pain and right-sided low back pain.  Physical exam today has proven to be positive for left hip arthralgia on Patrick maneuver.  It also proved to be positive for right-sided lumbar facet arthralgia on hyperextension and rotation maneuver.  The patient had requested to have those 2 treated as soon as possible.  RTCB: 09/16/2020  Pharmacotherapy Assessment   Analgesic: Tramadol 50 mg 2 tablets twice daily (200 mg/day of tramadol) MME/day:66m/day.   Monitoring: [place] PMP: PDMP reviewed during this encounter.       Pharmacotherapy: No side-effects or adverse reactions reported. Compliance: No problems identified. Effectiveness: Clinically acceptable.  GIgnatius Specking RN  06/01/2020  3:39 PM  Sign when Signing Visit Nursing Pain Medication Assessment:  Safety precautions to be maintained throughout the outpatient stay will include: orient to surroundings, keep bed in low position, maintain call bell within reach at all times, provide assistance with transfer out of bed and ambulation.  Medication Inspection Compliance: Pill count conducted under aseptic conditions, in front of the patient. Neither the pills nor the bottle was removed from the patient's sight at any time. Once count was completed pills were immediately returned to the patient in  their original bottle.  Medication: Tramadol (Ultram) Pill/Patch Count: 10/120 Pill/Patch Appearance: Markings consistent with prescribed medication Bottle Appearance: Standard pharmacy container. Clearly labeled. Filled Date: 4 / 21 / 2022 Last Medication intake:  Today    UDS:  Summary  Date Value Ref Range Status  11/18/2019 Note  Final    Comment:    ==================================================================== [employer] Select 13 (MW) ==================================================================== Test                             Result       Flag       Units  Drug Present and Declared for Prescription Verification   Tramadol                       >4854        EXPECTED   ng/mg creat   O-Desmethyltramadol            >4854        EXPECTED   ng/mg creat   N-Desmethyltramadol            >4854        EXPECTED   ng/mg creat    Source of tramadol is a prescription medication. O-desmethyltramadol    and N-desmethyltramadol are expected metabolites of tramadol.  Drug Absent but Declared for Prescription Verification   Hydrocodone                    Not Detected UNEXPECTED ng/mg creat ==================================================================== Test                      Result    Flag   Units      Ref Range   Creatinine              103              mg/dL      >=20 ==================================================================== Declared Medications:  The flagging and interpretation on this report are based on the  following declared medications.  Unexpected results may arise from  inaccuracies in the declared medications.   **Note: The testing scope of this panel includes these medications:   Hydrocodone (Hycodan)  Tramadol (Ultram)   **Note: The testing scope of this panel does not include the  following reported medications:   Acetaminophen  Albuterol  Aspirin  Betamethasone  Caffeine  Cetirizine (Zyrtec-D)  Chondroitin  Diphenhydramine  (Benadryl)  Duloxetine (Cymbalta)  Gabapentin (Neurontin)  Glucosamine  Homatropine (Hycodan)  Hydrochlorothiazide (Zestoretic)  Insulin  Iron  Liraglutide (Saxenda)  Lisinopril (Zestoretic)  Multivitamin  Plecanatide (Trulance)  Pseudoephedrine (Zyrtec-D)  Quetiapine (Seroquel)  Sennosides (Senna)  Supplement  Valacyclovir (Valtrex)  Vitamin D2 (Drisdol) ==================================================================== For clinical consultation, please call [phone]. ====================================================================      ROS  Constitutional: Denies any fever or chills Gastrointestinal: No reported hemesis, hematochezia, vomiting, or acute GI distress Musculoskeletal: Denies any acute onset joint swelling, redness, loss of ROM, or weakness Neurological: No reported episodes of acute onset apraxia, aphasia, dysarthria, agnosia, amnesia, paralysis, loss of coordination, or loss of consciousness  Medication Review  Albuterol Sulfate, Aspirin-Acetaminophen-Caffeine, DULoxetine, Glucosamine-Chondroitin, Insulin Pen Needle, OVER THE COUNTER MEDICATION, Plecanatide, QUEtiapine, Semaglutide-Weight Management, Senna, Valerian, Vitamin D (Ergocalciferol), betamethasone dipropionate, cetirizine-pseudoephedrine, diphenhydrAMINE, ferrous sulfate, gabapentin, lisinopril-hydrochlorothiazide, multivitamin with minerals, traMADol, and valACYclovir  History Review  Allergy: Ms. Roca has No Known Allergies. Drug: Ms. Linderman  reports no history of drug use.  Alcohol:  reports current alcohol use of about 1.0 standard drink of alcohol per week. Tobacco:  reports that she has been smoking cigarettes. She has a 1.75 pack-year smoking history. She has never used smokeless tobacco. Social: Ms. Tolin  reports that she has been smoking cigarettes. She has a 1.75 pack-year smoking history. She has never used smokeless tobacco. She reports current alcohol use of  about 1.0 standard drink of alcohol per week. She reports that she does not use drugs. Medical:  has a past medical history of Allergy, Anemia, Anxiety, Arthritis, Fibromyalgia, GERD (gastroesophageal reflux disease), Hypertension, IBS (irritable bowel syndrome), Nephrolithiasis, and Plantar fasciitis of right foot. Surgical: Ms. Alers  has a past surgical history that includes Left shoulder arthroscopic surgery (yrs ago); tummy tuck (2005); Breath tek h pylori (07/11/2011); Gastric Roux-En-Y (10/01/2011); Cholecystectomy (10/01/2011); and Colonoscopy. Family: family history includes Breast cancer (age of onset: 63) in her mother; Breast cancer (age of onset: 23) in her maternal aunt; Cancer in her maternal grandmother; Cancer (age of onset: 55) in her mother; Heart attack in her maternal grandfather and paternal grandfather; Heart disease in her father and maternal grandfather; Hypertension in her father; Lung cancer in her maternal grandmother; Multiple sclerosis in her maternal uncle; Obesity in her brother; Stroke in her father.  Laboratory Chemistry Profile   Renal Lab Results  Component Value Date   BUN 15 12/14/2019   CREATININE 0.71 12/14/2019   LABCREA 82 14/43/1540   BCR NOT APPLICABLE 08/67/6195   GFRAA 113 12/14/2019   GFRNONAA 97 12/14/2019     Hepatic Lab Results  Component Value Date   AST 14 12/14/2019   ALT 12 12/14/2019   ALBUMIN 4.5 03/05/2016   ALKPHOS 123 (H) 03/05/2016     Electrolytes Lab Results  Component Value Date   NA 141 12/14/2019   K 4.1 12/14/2019   CL 106 12/14/2019   CALCIUM 9.4 12/14/2019   MG 2.4 09/03/2019     Bone Lab Results  Component Value Date   VD25OH 52 09/03/2019     Inflammation (CRP: Acute Phase) (ESR: Chronic Phase) Lab Results  Component Value Date   CRP 0.9 03/04/2017   ESRSEDRATE 22 (H) 03/04/2017       Note: Above Lab results reviewed.  Recent Imaging Review  MM 3D SCREEN BREAST BILATERAL CLINICAL DATA:   Screening.  EXAM: DIGITAL SCREENING BILATERAL MAMMOGRAM WITH TOMOSYNTHESIS AND CAD  TECHNIQUE: Bilateral screening digital craniocaudal and mediolateral oblique mammograms were obtained. Bilateral screening digital breast tomosynthesis was performed. The images were evaluated with computer-aided detection.  COMPARISON:  Previous exam(s).  ACR Breast Density Category a: The breast tissue is almost entirely fatty.  FINDINGS: There are no findings suspicious for malignancy.  IMPRESSION: No mammographic evidence of malignancy. A result letter of this screening mammogram will be mailed directly to the patient.  RECOMMENDATION: Screening mammogram in one year. (Code:SM-B-01Y)  BI-RADS CATEGORY  1: Negative.  Electronically Signed   By: Evangeline Dakin M.D.   On: 03/09/2020 15:29 Note: Reviewed        Physical Exam  General appearance: Well nourished, well developed, and well hydrated. In no apparent acute distress Mental status: Alert, oriented x 3 (person, place, & time)       Respiratory: No evidence of acute respiratory distress Eyes: PERLA Vitals: BP (!) 148/88 (BP Location: Right Arm, Patient Position: Sitting, Cuff Size: Normal)   Pulse 78   Temp (!) 97.3 F (36.3 C) (Temporal)   Resp 16  Ht '5\' 7"'  (1.702 m)   Wt 192 lb (87.1 kg)   SpO2 100%   BMI 30.07 kg/m  BMI: Estimated body mass index is 30.07 kg/m as calculated from the following:   Height as of this encounter: '5\' 7"'  (1.702 m).   Weight as of this encounter: 192 lb (87.1 kg). Ideal: Ideal body weight: 61.6 kg (135 lb 12.9 oz) Adjusted ideal body weight: 71.8 kg (158 lb 4.5 oz)  Assessment   Status Diagnosis  Controlled Controlled Controlled 1. Chronic pain syndrome   2. Chronic generalized pain (1ry area of Pain)   3. Fibromyalgia affecting multiple sites (2ry area of Pain)   4. Chronic shoulder pain (3ry area of Pain) (Bilateral) (R>L)   5. Chronic hip pain (4th area of Pain) (Bilateral) (R>L)    6. Pharmacologic therapy   7. Chronic use of opiate for therapeutic purpose   8. Uncomplicated opioid dependence ([place])   9. Lumbar facet syndrome (Right)   10. Spondylosis without myelopathy or radiculopathy, lumbosacral region   11. Chronic hip pain (Left)   12. Chronic low back pain (Right) w/o sciatica      Updated Problems: Problem  Lumbar facet syndrome (Right)  Spondylosis Without Myelopathy Or Radiculopathy, Lumbosacral Region  Chronic hip pain (Left)  Chronic low back pain (Right) w/o sciatica    Plan of Care  Problem-specific:  No problem-specific Assessment & Plan notes found for this encounter.  Ms. Zaiya Annunziato Wimberley has a current medication list which includes the following long-term medication(s): duloxetine, duloxetine, gabapentin, lisinopril-hydrochlorothiazide, quetiapine, quetiapine, and [START ON 06/18/2020] tramadol.  Pharmacotherapy (Medications Ordered): Meds ordered this encounter  Medications  . traMADol (ULTRAM) 50 MG tablet    Sig: Take 1 tablet (50 mg total) by mouth every 6 (six) hours as needed for severe pain. Each refill must last 30 days.    Dispense:  120 tablet    Refill:  2    Not a duplicate. Do NOT delete! Dispense 1 day early if closed on refill date. Avoid benzodiazepines within 8 hours of opioids. Do not send refill requests.   Orders:  Orders Placed This Encounter  Procedures  . LUMBAR FACET(MEDIAL BRANCH NERVE BLOCK) MBNB    Standing Status:   Future    Standing Expiration Date:   07/02/2020    Scheduling Instructions:     Procedure: Lumbar facet block (AKA.: Lumbosacral medial branch nerve block)     Side: Right-sided     Level: L3-4, L4-5, & L5-S1 Facets (L2, L3, L4, L5, & S1 Medial Branch Nerves)     Sedation: With Sedation.     Timeframe: ASAA    Order Specific Question:   Where will this procedure be performed?    Answer:   [facility]  . HIP INJECTION    Standing Status:   Future    Standing Expiration Date:    09/01/2020    Scheduling Instructions:     Side: Left-sided     Sedation: With Sedation.     Timeframe: As soon as schedule allows   Follow-up plan:   Return for Procedure (w/ sedation): (L) IA Hip inj + (R) L-FCT BLK.      Interventional options: Considering: Diagnostic bilateral intra-articular shoulder joint injection Diagnostic bilateral suprascapular nerve block Possible bilateral suprascapular nerve RFA Diagnostic bilateral intra-articular hip joint injection Diagnostic/therapeutic bilateral trochanteric bursa injection Diagnostic bilateral femoral nerve +obturator nerve block Possible bilateral femoral nerve + obturator nerve RFA Diagnostic/therapeuticLidocaine infusion Diagnostic/therapeuticTrigger point injection Diagnostic  bilateral cervical facet block Diagnostic bilateral cervical facet RFA   Palliative PRN treatment(s): Diagnostic bilateralintra-articularhipjoint injection#2+ bilateral trochanteric bursa injection #2, under fluoroscopic guidance and IV sedation    Recent Visits No visits were found meeting these conditions. Showing recent visits within past 90 days and meeting all other requirements Today's Visits Date Type Provider Dept  06/01/20 Office Visit Milinda Pointer, MD [facility]  Showing today's visits and meeting all other requirements Future Appointments No visits were found meeting these conditions. Showing future appointments within next 90 days and meeting all other requirements  I discussed the assessment and treatment plan with the patient. The patient was provided an opportunity to ask questions and all were answered. The patient agreed with the plan and demonstrated an understanding of the instructions.  Patient advised to call back or seek an in-person evaluation if the symptoms or condition worsens.  Duration of encounter: 30 minutes.  Note by: Gaspar Cola, MD Date: 06/01/2020; Time: 4:15 PM

## 2020-06-01 ENCOUNTER — Other Ambulatory Visit: Payer: Self-pay

## 2020-06-01 ENCOUNTER — Ambulatory Visit: Payer: BC Managed Care – PPO | Attending: Pain Medicine | Admitting: Pain Medicine

## 2020-06-01 ENCOUNTER — Encounter: Payer: Self-pay | Admitting: Pain Medicine

## 2020-06-01 VITALS — BP 148/88 | HR 78 | Temp 97.3°F | Resp 16 | Ht 67.0 in | Wt 192.0 lb

## 2020-06-01 DIAGNOSIS — F1120 Opioid dependence, uncomplicated: Secondary | ICD-10-CM

## 2020-06-01 DIAGNOSIS — M25511 Pain in right shoulder: Secondary | ICD-10-CM

## 2020-06-01 DIAGNOSIS — Z79891 Long term (current) use of opiate analgesic: Secondary | ICD-10-CM | POA: Insufficient documentation

## 2020-06-01 DIAGNOSIS — M545 Low back pain, unspecified: Secondary | ICD-10-CM | POA: Diagnosis not present

## 2020-06-01 DIAGNOSIS — M47816 Spondylosis without myelopathy or radiculopathy, lumbar region: Secondary | ICD-10-CM | POA: Insufficient documentation

## 2020-06-01 DIAGNOSIS — G894 Chronic pain syndrome: Secondary | ICD-10-CM

## 2020-06-01 DIAGNOSIS — M25552 Pain in left hip: Secondary | ICD-10-CM

## 2020-06-01 DIAGNOSIS — M47817 Spondylosis without myelopathy or radiculopathy, lumbosacral region: Secondary | ICD-10-CM | POA: Diagnosis not present

## 2020-06-01 DIAGNOSIS — M25512 Pain in left shoulder: Secondary | ICD-10-CM | POA: Diagnosis not present

## 2020-06-01 DIAGNOSIS — M797 Fibromyalgia: Secondary | ICD-10-CM | POA: Insufficient documentation

## 2020-06-01 DIAGNOSIS — Z79899 Other long term (current) drug therapy: Secondary | ICD-10-CM | POA: Diagnosis not present

## 2020-06-01 DIAGNOSIS — G8929 Other chronic pain: Secondary | ICD-10-CM | POA: Insufficient documentation

## 2020-06-01 DIAGNOSIS — R52 Pain, unspecified: Secondary | ICD-10-CM

## 2020-06-01 DIAGNOSIS — M25551 Pain in right hip: Secondary | ICD-10-CM

## 2020-06-01 MED ORDER — TRAMADOL HCL 50 MG PO TABS: 50.0000 mg | ORAL_TABLET | Freq: Four times a day (QID) | ORAL | 2 refills | Status: DC | PRN

## 2020-06-01 NOTE — Patient Instructions (Signed)
____________________________________________________________________________________________  Preparing for Procedure with Sedation  Procedure appointments are limited to planned procedures: . No Prescription Refills. . No disability issues will be discussed. . No medication changes will be discussed.  Instructions: . Oral Intake: Do not eat or drink anything for at least 8 hours prior to your procedure. (Exception: Blood Pressure Medication. See below.) . Transportation: Unless otherwise stated by your physician, you may drive yourself after the procedure. . Blood Pressure Medicine: Do not forget to take your blood pressure medicine with a sip of water the morning of the procedure. If your Diastolic (lower reading)is above 100 mmHg, elective cases will be cancelled/rescheduled. . Blood thinners: These will need to be stopped for procedures. Notify our staff if you are taking any blood thinners. Depending on which one you take, there will be specific instructions on how and when to stop it. . Diabetics on insulin: Notify the staff so that you can be scheduled 1st case in the morning. If your diabetes requires high dose insulin, take only  of your normal insulin dose the morning of the procedure and notify the staff that you have done so. . Preventing infections: Shower with an antibacterial soap the morning of your procedure. . Build-up your immune system: Take 1000 mg of Vitamin C with every meal (3 times a day) the day prior to your procedure. . Antibiotics: Inform the staff if you have a condition or reason that requires you to take antibiotics before dental procedures. . Pregnancy: If you are pregnant, call and cancel the procedure. . Sickness: If you have a cold, fever, or any active infections, call and cancel the procedure. . Arrival: You must be in the facility at least 30 minutes prior to your scheduled procedure. . Children: Do not bring children with you. . Dress appropriately:  Bring dark clothing that you would not mind if they get stained. . Valuables: Do not bring any jewelry or valuables.  Reasons to call and reschedule or cancel your procedure: (Following these recommendations will minimize the risk of a serious complication.) . Surgeries: Avoid having procedures within 2 weeks of any surgery. (Avoid for 2 weeks before or after any surgery). . Flu Shots: Avoid having procedures within 2 weeks of a flu shots or . (Avoid for 2 weeks before or after immunizations). . Barium: Avoid having a procedure within 7-10 days after having had a radiological study involving the use of radiological contrast. (Myelograms, Barium swallow or enema study). . Heart attacks: Avoid any elective procedures or surgeries for the initial 6 months after a "Myocardial Infarction" (Heart Attack). . Blood thinners: It is imperative that you stop these medications before procedures. Let us know if you if you take any blood thinner.  . Infection: Avoid procedures during or within two weeks of an infection (including chest colds or gastrointestinal problems). Symptoms associated with infections include: Localized redness, fever, chills, night sweats or profuse sweating, burning sensation when voiding, cough, congestion, stuffiness, runny nose, sore throat, diarrhea, nausea, vomiting, cold or Flu symptoms, recent or current infections. It is specially important if the infection is over the area that we intend to treat. . Heart and lung problems: Symptoms that may suggest an active cardiopulmonary problem include: cough, chest pain, breathing difficulties or shortness of breath, dizziness, ankle swelling, uncontrolled high or unusually low blood pressure, and/or palpitations. If you are experiencing any of these symptoms, cancel your procedure and contact your primary care physician for an evaluation.  Remember:  Regular Business hours are:    Monday to Thursday 8:00 AM to 4:00 PM  Provider's  Schedule: Iyania Denne, MD:  Procedure days: Tuesday and Thursday 7:30 AM to 4:00 PM  Bilal Lateef, MD:  Procedure days: Monday and Wednesday 7:30 AM to 4:00 PM ____________________________________________________________________________________________   ____________________________________________________________________________________________  General Risks and Possible Complications  Patient Responsibilities: It is important that you read this as it is part of your informed consent. It is our duty to inform you of the risks and possible complications associated with treatments offered to you. It is your responsibility as a patient to read this and to ask questions about anything that is not clear or that you believe was not covered in this document.  Patient's Rights: You have the right to refuse treatment. You also have the right to change your mind, even after initially having agreed to have the treatment done. However, under this last option, if you wait until the last second to change your mind, you may be charged for the materials used up to that point.  Introduction: Medicine is not an exact science. Everything in Medicine, including the lack of treatment(s), carries the potential for danger, harm, or loss (which is by definition: Risk). In Medicine, a complication is a secondary problem, condition, or disease that can aggravate an already existing one. All treatments carry the risk of possible complications. The fact that a side effects or complications occurs, does not imply that the treatment was conducted incorrectly. It must be clearly understood that these can happen even when everything is done following the highest safety standards.  No treatment: You can choose not to proceed with the proposed treatment alternative. The "PRO(s)" would include: avoiding the risk of complications associated with the therapy. The "CON(s)" would include: not getting any of the treatment  benefits. These benefits fall under one of three categories: diagnostic; therapeutic; and/or palliative. Diagnostic benefits include: getting information which can ultimately lead to improvement of the disease or symptom(s). Therapeutic benefits are those associated with the successful treatment of the disease. Finally, palliative benefits are those related to the decrease of the primary symptoms, without necessarily curing the condition (example: decreasing the pain from a flare-up of a chronic condition, such as incurable terminal cancer).  General Risks and Complications: These are associated to most interventional treatments. They can occur alone, or in combination. They fall under one of the following six (6) categories: no benefit or worsening of symptoms; bleeding; infection; nerve damage; allergic reactions; and/or death. 1. No benefits or worsening of symptoms: In Medicine there are no guarantees, only probabilities. No healthcare provider can ever guarantee that a medical treatment will work, they can only state the probability that it may. Furthermore, there is always the possibility that the condition may worsen, either directly, or indirectly, as a consequence of the treatment. 2. Bleeding: This is more common if the patient is taking a blood thinner, either prescription or over the counter (example: Goody Powders, Fish oil, Aspirin, Garlic, etc.), or if suffering a condition associated with impaired coagulation (example: Hemophilia, cirrhosis of the liver, low platelet counts, etc.). However, even if you do not have one on these, it can still happen. If you have any of these conditions, or take one of these drugs, make sure to notify your treating physician. 3. Infection: This is more common in patients with a compromised immune system, either due to disease (example: diabetes, cancer, human immunodeficiency virus [HIV], etc.), or due to medications or treatments (example: therapies used to treat  cancer and   rheumatological diseases). However, even if you do not have one on these, it can still happen. If you have any of these conditions, or take one of these drugs, make sure to notify your treating physician. 4. Nerve Damage: This is more common when the treatment is an invasive one, but it can also happen with the use of medications, such as those used in the treatment of cancer. The damage can occur to small secondary nerves, or to large primary ones, such as those in the spinal cord and brain. This damage may be temporary or permanent and it may lead to impairments that can range from temporary numbness to permanent paralysis and/or brain death. 5. Allergic Reactions: Any time a substance or material comes in contact with our body, there is the possibility of an allergic reaction. These can range from a mild skin rash (contact dermatitis) to a severe systemic reaction (anaphylactic reaction), which can result in death. 6. Death: In general, any medical intervention can result in death, most of the time due to an unforeseen complication. ____________________________________________________________________________________________  ____________________________________________________________________________________________  Medication Recommendations and Reminders  Applies to: All patients receiving prescriptions (written and/or electronic).  Medication Rules & Regulations: These rules and regulations exist for your safety and that of others. They are not flexible and neither are we. Dismissing or ignoring them will be considered "non-compliance" with medication therapy, resulting in complete and irreversible termination of such therapy. (See document titled "Medication Rules" for more details.) In all conscience, because of safety reasons, we cannot continue providing a therapy where the patient does not follow instructions.  Pharmacy of record:   Definition: This is the pharmacy where your  electronic prescriptions will be sent.   We do not endorse any particular pharmacy, however, we have experienced problems with [facility] not securing enough medication supply for the community.  We do not restrict you in your choice of pharmacy. However, once we write for your prescriptions, we will NOT be re-sending more prescriptions to fix restricted supply problems created by your pharmacy, or your insurance.   The pharmacy listed in the electronic medical record should be the one where you want electronic prescriptions to be sent.  If you choose to change pharmacy, simply notify our nursing staff.  Recommendations:  Keep all of your pain medications in a safe place, under lock and key, even if you live alone. We will NOT replace lost, stolen, or damaged medication.  After you fill your prescription, take 1 week's worth of pills and put them away in a safe place. You should keep a separate, properly labeled bottle for this purpose. The remainder should be kept in the original bottle. Use this as your primary supply, until it runs out. Once it's gone, then you know that you have 1 week's worth of medicine, and it is time to come in for a prescription refill. If you do this correctly, it is unlikely that you will ever run out of medicine.  To make sure that the above recommendation works, it is very important that you make sure your medication refill appointments are scheduled at least 1 week before you run out of medicine. To do this in an effective manner, make sure that you do not leave the office without scheduling your next medication management appointment. Always ask the nursing staff to show you in your prescription , when your medication will be running out. Then arrange for the receptionist to get you a return appointment, at least 7 days before you  run out of medicine. Do not wait until you have 1 or 2 pills left, to come in. This is very poor planning and does not take into consideration  that we may need to cancel appointments due to bad weather, sickness, or emergencies affecting our staff.  DO NOT ACCEPT A "Partial Fill": If for any reason your pharmacy does not have enough pills/tablets to completely fill or refill your prescription, do not allow for a "partial fill". The law allows the pharmacy to complete that prescription within 72 hours, without requiring a new prescription. If they do not fill the rest of your prescription within those 72 hours, you will need a separate prescription to fill the remaining amount, which we will NOT provide. If the reason for the partial fill is your insurance, you will need to talk to the pharmacist about payment alternatives for the remaining tablets, but again, DO NOT ACCEPT A PARTIAL FILL, unless you can trust your pharmacist to obtain the remainder of the pills within 72 hours.  Prescription refills and/or changes in medication(s):   Prescription refills, and/or changes in dose or medication, will be conducted only during scheduled medication management appointments. (Applies to both, written and electronic prescriptions.)  No refills on procedure days. No medication will be changed or started on procedure days. No changes, adjustments, and/or refills will be conducted on a procedure day. Doing so will interfere with the diagnostic portion of the procedure.  No phone refills. No medications will be "called into the pharmacy".  No Fax refills.  No weekend refills.  No Holliday refills.  No after hours refills.  Remember:  Business hours are:  Monday to Thursday 8:00 AM to 4:00 PM Provider's Schedule: Delano Metz, MD - Appointments are:  Medication management: Monday and Wednesday 8:00 AM to 4:00 PM Procedure day: Tuesday and Thursday 7:30 AM to 4:00 PM Edward Jolly, MD - Appointments are:  Medication management: Tuesday and Thursday 8:00 AM to 4:00 PM Procedure day: Monday and Wednesday 7:30 AM to 4:00 PM (Last update:  08/05/2019) ____________________________________________________________________________________________   ____________________________________________________________________________________________  CBD (cannabidiol) WARNING  Applicable to: All individuals currently taking or considering taking CBD (cannabidiol) and, more important, all patients taking opioid analgesic controlled substances (pain medication). (Example: oxycodone; oxymorphone; hydrocodone; hydromorphone; morphine; methadone; tramadol; tapentadol; fentanyl; buprenorphine; butorphanol; dextromethorphan; meperidine; codeine; etc.)  Legal status: CBD remains a Schedule I drug prohibited for any use. CBD is illegal with one exception. In the [place], CBD has a limited Education officer, environmental (FDA) approval for the treatment of two specific types of epilepsy disorders. Only one CBD product has been approved by the FDA for this purpose: "Epidiolex". FDA is aware that some companies are marketing products containing cannabis and cannabis-derived compounds in ways that violate the [employer], Drug and Cosmetic Act [facility] Act) and that may put the health and safety of consumers at risk. The FDA, a Federal agency, has not enforced the CBD status since 2018.   Legality: Some manufacturers ship CBD products nationally, which is illegal. Often such products are sold online and are therefore available throughout the country. CBD is openly sold in head shops and health food stores in some states where such sales have not been explicitly legalized. Selling unapproved products with unsubstantiated therapeutic claims is not only a violation of the law, but also can put patients at risk, as these products have not been proven to be safe or effective. Federal illegality makes it difficult to conduct research on CBD.  CBD.  Reference: "FDA Regulation of Cannabis and Cannabis-Derived Products, Including Cannabidiol (CBD)" -  https://www.fda.gov/news-events/public-health-focus/fda-regulation-cannabis-and-cannabis-derived-products-including-cannabidiol-cbd  Warning: CBD is not FDA approved and has not undergo the same manufacturing controls as prescription drugs.  This means that the purity and safety of available CBD may be questionable. Most of the time, despite manufacturer's claims, it is contaminated with THC (delta-9-tetrahydrocannabinol - the chemical in marijuana responsible for the "HIGH").  When this is the case, the THC contaminant will trigger a positive urine drug screen (UDS) test for Marijuana (carboxy-THC). Because a positive UDS for any illicit substance is a violation of our medication agreement, your opioid analgesics (pain medicine) may be permanently discontinued.  MORE ABOUT CBD  General Information: CBD  is a derivative of the Marijuana (cannabis sativa) plant discovered in 1940. It is one of the 113 identified substances found in Marijuana. It accounts for up to 40% of the plant's extract. As of 2018, preliminary clinical studies on CBD included research for the treatment of anxiety, movement disorders, and pain. CBD is available and consumed in multiple forms, including inhalation of smoke or vapor, as an aerosol spray, and by mouth. It may be supplied as an oil containing CBD, capsules, dried cannabis, or as a liquid solution. CBD is thought not to be as psychoactive as THC (delta-9-tetrahydrocannabinol - the chemical in marijuana responsible for the "HIGH"). Studies suggest that CBD may interact with different biological target receptors in the body, including cannabinoid and other neurotransmitter receptors. As of 2018 the mechanism of action for its biological effects has not been determined.  Side-effects  Adverse reactions: Dry mouth, diarrhea, decreased appetite, fatigue, drowsiness, malaise, weakness, sleep disturbances, and others.  Drug interactions: CBC may interact with other medications  such as blood-thinners. (Last update: 08/22/2019) ____________________________________________________________________________________________   ____________________________________________________________________________________________  Medication Rules  Purpose: To inform patients, and their family members, of our rules and regulations.  Applies to: All patients receiving prescriptions (written or electronic).  Pharmacy of record: Pharmacy where electronic prescriptions will be sent. If written prescriptions are taken to a different pharmacy, please inform the nursing staff. The pharmacy listed in the electronic medical record should be the one where you would like electronic prescriptions to be sent.  Electronic prescriptions: In compliance with the  Strengthen Opioid Misuse Prevention (STOP) Act of 2017 (Session Law 2017-74/H243), effective January 15, 2018, all controlled substances must be electronically prescribed. Calling prescriptions to the pharmacy will cease to exist.  Prescription refills: Only during scheduled appointments. Applies to all prescriptions.  NOTE: The following applies primarily to controlled substances (Opioid* Pain Medications).   Type of encounter (visit): For patients receiving controlled substances, face-to-face visits are required. (Not an option or up to the patient.)  Patient's responsibilities: 1. Pain Pills: Bring all pain pills to every appointment (except for procedure appointments). 2. Pill Bottles: Bring pills in original pharmacy bottle. Always bring the newest bottle. Bring bottle, even if empty. 3. Medication refills: You are responsible for knowing and keeping track of what medications you take and those you need refilled. The day before your appointment: write a list of all prescriptions that need to be refilled. The day of the appointment: give the list to the admitting nurse. Prescriptions will be written only during  appointments. No prescriptions will be written on procedure days. If you forget a medication: it will not be "Called in", "Faxed", or "electronically sent". You will need to get another appointment to get these prescribed. No early refills. Do not call asking   to have your prescription filled early. 4. Prescription Accuracy: You are responsible for carefully inspecting your prescriptions before leaving our office. Have the discharge nurse carefully go over each prescription with you, before taking them home. Make sure that your name is accurately spelled, that your address is correct. Check the name and dose of your medication to make sure it is accurate. Check the number of pills, and the written instructions to make sure they are clear and accurate. Make sure that you are given enough medication to last until your next medication refill appointment. 5. Taking Medication: Take medication as prescribed. When it comes to controlled substances, taking less pills or less frequently than prescribed is permitted and encouraged. Never take more pills than instructed. Never take medication more frequently than prescribed.  6. Inform other Doctors: Always inform, all of your healthcare providers, of all the medications you take. 7. Pain Medication from other Providers: You are not allowed to accept any additional pain medication from any other Doctor or Healthcare provider. There are two exceptions to this rule. (see below) In the event that you require additional pain medication, you are responsible for notifying us, as stated below. 8. Cough Medicine: Often these contain an opioid, such as codeine or hydrocodone. Never accept or take cough medicine containing these opioids if you are already taking an opioid* medication. The combination may cause respiratory failure and death. 9. Medication Agreement: You are responsible for carefully reading and following our Medication Agreement. This must be signed before  receiving any prescriptions from our practice. Safely store a copy of your signed Agreement. Violations to the Agreement will result in no further prescriptions. (Additional copies of our Medication Agreement are available upon request.) 10. Laws, Rules, & Regulations: All patients are expected to follow all Federal and State Laws, Statutes, Rules, & Regulations. Ignorance of the Laws does not constitute a valid excuse.  11. Illegal drugs and Controlled Substances: The use of illegal substances (including, but not limited to marijuana and its derivatives) and/or the illegal use of any controlled substances is strictly prohibited. Violation of this rule may result in the immediate and permanent discontinuation of any and all prescriptions being written by our practice. The use of any illegal substances is prohibited. 12. Adopted CDC guidelines & recommendations: Target dosing levels will be at or below 60 MME/day. Use of benzodiazepines** is not recommended.  Exceptions: There are only two exceptions to the rule of not receiving pain medications from other Healthcare Providers. 1. Exception #1 (Emergencies): In the event of an emergency (i.e.: accident requiring emergency care), you are allowed to receive additional pain medication. However, you are responsible for: As soon as you are able, call our office [phone], at any time of the day or night, and leave a message stating your name, the date and nature of the emergency, and the name and dose of the medication prescribed. In the event that your call is answered by a member of our staff, make sure to document and save the date, time, and the name of the person that took your information.  2. Exception #2 (Planned Surgery): In the event that you are scheduled by another doctor or dentist to have any type of surgery or procedure, you are allowed (for a period no longer than 30 days), to receive additional pain medication, for the acute post-op pain.  However, in this case, you are responsible for picking up a copy of our "Post-op Pain Management for Surgeons" handout, and giving   your surgeon or dentist. This document is available at our office, and does not require an appointment to obtain it. Simply go to our office during business hours (Monday-Thursday from 8:00 AM to 4:00 PM) (Friday 8:00 AM to 12:00 Noon) or if you have a scheduled appointment with [place], prior to your surgery, and ask for it by name. In addition, you are responsible for: calling our office [phone], at any time of the day or night, and leaving a message stating your name, name of your surgeon, type of surgery, and date of procedure or surgery. Failure to comply with your responsibilities may result in termination of therapy involving the controlled substances.  *Opioid medications include: morphine, codeine, oxycodone, oxymorphone, hydrocodone, hydromorphone, meperidine, tramadol, tapentadol, buprenorphine, fentanyl, methadone. **Benzodiazepine medications include: diazepam (Valium), alprazolam (Xanax), clonazepam (Klonopine), lorazepam (Ativan), clorazepate (Tranxene), chlordiazepoxide (Librium), estazolam (Prosom), oxazepam (Serax), temazepam (Restoril), triazolam (Halcion) (Last updated: 12/14/2019) ____________________________________________________________________________________________

## 2020-06-01 NOTE — Progress Notes (Signed)
Nursing Pain Medication Assessment:  Safety precautions to be maintained throughout the outpatient stay will include: orient to surroundings, keep bed in low position, maintain call bell within reach at all times, provide assistance with transfer out of bed and ambulation.  Medication Inspection Compliance: Pill count conducted under aseptic conditions, in front of the patient. Neither the pills nor the bottle was removed from the patient's sight at any time. Once count was completed pills were immediately returned to the patient in their original bottle.  Medication: Tramadol (Ultram) Pill/Patch Count: 10/120 Pill/Patch Appearance: Markings consistent with prescribed medication Bottle Appearance: Standard pharmacy container. Clearly labeled. Filled Date: 4 / 21 / 2022 Last Medication intake:  Today

## 2020-06-02 ENCOUNTER — Encounter: Payer: BC Managed Care – PPO | Admitting: Pain Medicine

## 2020-06-08 ENCOUNTER — Encounter: Payer: BC Managed Care – PPO | Admitting: Pain Medicine

## 2020-06-14 ENCOUNTER — Other Ambulatory Visit: Payer: Self-pay

## 2020-06-14 MED ORDER — WEGOVY 2.4 MG/0.75ML ~~LOC~~ SOAJ: 2.4000 mg | Freq: Every day | SUBCUTANEOUS | 3 refills | Status: DC

## 2020-06-16 ENCOUNTER — Other Ambulatory Visit: Payer: Self-pay

## 2020-06-16 MED ORDER — WEGOVY 2.4 MG/0.75ML ~~LOC~~ SOAJ: 2.4000 mg | SUBCUTANEOUS | 3 refills | Status: DC

## 2020-07-16 ENCOUNTER — Other Ambulatory Visit: Payer: Self-pay | Admitting: Adult Health Nurse Practitioner

## 2020-07-18 ENCOUNTER — Other Ambulatory Visit: Payer: Self-pay | Admitting: Adult Health Nurse Practitioner

## 2020-07-18 DIAGNOSIS — M797 Fibromyalgia: Secondary | ICD-10-CM

## 2020-08-04 ENCOUNTER — Other Ambulatory Visit: Payer: Self-pay | Admitting: Adult Health Nurse Practitioner

## 2020-08-07 ENCOUNTER — Other Ambulatory Visit: Payer: Self-pay | Admitting: Adult Health Nurse Practitioner

## 2020-08-07 DIAGNOSIS — M797 Fibromyalgia: Secondary | ICD-10-CM

## 2020-08-09 ENCOUNTER — Telehealth: Payer: Self-pay

## 2020-08-09 NOTE — Telephone Encounter (Signed)
Can we get this patients appointment moved up to before 08-27-2020 for her med refill please?  Let me know if it is not possible.

## 2020-08-09 NOTE — Telephone Encounter (Signed)
Does not have a refill for her meds they run out 08/29/20 her next is not until 09/14/20. What is the next step

## 2020-08-18 ENCOUNTER — Telehealth: Payer: Self-pay

## 2020-08-18 NOTE — Telephone Encounter (Signed)
Spoke with patient and she has been scheduled an appt for 0811/22 for medication management to try and figure out why her fill dates are off.  One thing that is noted is that the Rx that was written on 06/01/20 was not to be filled until 0604/22 and it was filled a couple of days out from appt time.  Patient was not aware of this DNF date and because she only had a couple of days worth at the appt time, per the pill count, she went ahead and filled the Rx.  She was questioning why the nurse did not go over this with her as to the shortage/fill dates not accommodating her medication needs.

## 2020-08-18 NOTE — Telephone Encounter (Signed)
Pt states she needs her Tramadol Refilled

## 2020-08-22 NOTE — Progress Notes (Signed)
PROVIDER NOTE: Information contained herein reflects review and annotations entered in association with encounter. Interpretation of such information and data should be left to medically-trained personnel. Information provided to patient can be located elsewhere in the medical record under "Patient Instructions". Document created using STT-dictation technology, any transcriptional errors that may result from process are unintentional.    Patient: Wendy Valencia  Service Category: E/M  Provider: Gaspar Cola, MD  DOB: 11/25/1965  DOS: 08/25/2020  Specialty: Interventional Pain Management  MRN: [id number]  Setting: Ambulatory outpatient  PCP: Unk Pinto, MD  Type: Established Patient    Referring Provider: Unk Pinto, MD  Location: Office  Delivery: Face-to-face     HPI  [name], a 55 y.o. year old female, is here today because of her Chronic pain syndrome [G89.4]. [name]'s primary complain today is Joint Pain (generalized) Last encounter: My last encounter with her was on 06/01/2020. Pertinent problems: [name] has Cervical spondylosis without myelopathy; Fibromyalgia affecting multiple sites (2ry area of Pain); Spondylosis of lumbar spine; Chronic generalized pain (1ry area of Pain); Chronic pain syndrome; Chronic shoulder pain (3ry area of Pain) (Bilateral) (R>L); Chronic hip pain (4th area of Pain) (Bilateral) (R>L); Greater trochanteric bursitis of hips (Bilateral); Osteoarthritis of hips (Bilateral); Lumbar facet syndrome (Right); Spondylosis without myelopathy or radiculopathy, lumbosacral region; Chronic hip pain (Left); and Chronic low back pain (Right) w/o sciatica on their pertinent problem list. Pain Assessment: Severity of Chronic pain is reported as a 4 /10. Location: Generalized Right, Left/everywhere. Onset: More than a month ago. Quality: Tingling, Aching. Timing: Constant. Modifying factor(s): meds. Vitals:  height is '5\' 7"'  (1.702 m) and  weight is 192 lb (87.1 kg). Her temperature is 97.2 F (36.2 C) (abnormal). Her blood pressure is 143/94 (abnormal) and her pulse is 72. Her oxygen saturation is 99%.   Reason for encounter: medication management.   The patient indicates doing well with the current medication regimen. No adverse reactions or side effects reported to the medications.  Today we spent quite a bit of time going over her PMP and the amount of medication that she had left and for one reason or another it did not really match.  In any case, the patient has been warned to make sure that she counts her pills when she receives them to make sure that she has at least a 120 pills that she should be getting.  In any case today I have written her prescription to be filled in approximately 7 days, when she will be running out of the 27 pills that she has left.  RTCB: 03/01/2021  Pharmacotherapy Assessment  Analgesic: Tramadol 50 mg 2 tablets twice daily (200 mg/day of tramadol) MME/day: 20 mg/day.   Monitoring: [place] PMP: PDMP reviewed during this encounter.       Pharmacotherapy: No side-effects or adverse reactions reported. Compliance: No problems identified. Effectiveness: Clinically acceptable.  Chauncey Fischer, RN  08/25/2020  1:13 PM  Sign when Signing Visit Nursing Pain Medication Assessment:  Safety precautions to be maintained throughout the outpatient stay will include: orient to surroundings, keep bed in low position, maintain call bell within reach at all times, provide assistance with transfer out of bed and ambulation.  Medication Inspection Compliance: Pill count conducted under aseptic conditions, in front of the patient. Neither the pills nor the bottle was removed from the patient's sight at any time. Once count was completed pills were immediately returned to the patient in their original bottle.  Medication: Tramadol (Ultram) Pill/Patch Count:  27 of 120 pills remain Pill/Patch Appearance: Markings  consistent with prescribed medication Bottle Appearance: Standard pharmacy container. Clearly labeled. Filled Date: 7 / 12 / 2022 Last Medication intake:  Today Safety precautions to be maintained throughout the outpatient stay will include: orient to surroundings, keep bed in low position, maintain call bell within reach at all times, provide assistance with transfer out of bed and ambulation.      UDS:  Summary  Date Value Ref Range Status  11/18/2019 Note  Final    Comment:    ==================================================================== [employer] Select 13 (MW) ==================================================================== Test                             Result       Flag       Units  Drug Present and Declared for Prescription Verification   Tramadol                       >4854        EXPECTED   ng/mg creat   O-Desmethyltramadol            >4854        EXPECTED   ng/mg creat   N-Desmethyltramadol            >4854        EXPECTED   ng/mg creat    Source of tramadol is a prescription medication. O-desmethyltramadol    and N-desmethyltramadol are expected metabolites of tramadol.  Drug Absent but Declared for Prescription Verification   Hydrocodone                    Not Detected UNEXPECTED ng/mg creat ==================================================================== Test                      Result    Flag   Units      Ref Range   Creatinine              103              mg/dL      >=20 ==================================================================== Declared Medications:  The flagging and interpretation on this report are based on the  following declared medications.  Unexpected results may arise from  inaccuracies in the declared medications.   **Note: The testing scope of this panel includes these medications:   Hydrocodone (Hycodan)  Tramadol (Ultram)   **Note: The testing scope of this panel does not include the  following reported medications:    Acetaminophen  Albuterol  Aspirin  Betamethasone  Caffeine  Cetirizine (Zyrtec-D)  Chondroitin  Diphenhydramine (Benadryl)  Duloxetine (Cymbalta)  Gabapentin (Neurontin)  Glucosamine  Homatropine (Hycodan)  Hydrochlorothiazide (Zestoretic)  Insulin  Iron  Liraglutide (Saxenda)  Lisinopril (Zestoretic)  Multivitamin  Plecanatide (Trulance)  Pseudoephedrine (Zyrtec-D)  Quetiapine (Seroquel)  Sennosides (Senna)  Supplement  Valacyclovir (Valtrex)  Vitamin D2 (Drisdol) ==================================================================== For clinical consultation, please call [phone]. ====================================================================      ROS  Constitutional: Denies any fever or chills Gastrointestinal: No reported hemesis, hematochezia, vomiting, or acute GI distress Musculoskeletal: Denies any acute onset joint swelling, redness, loss of ROM, or weakness Neurological: No reported episodes of acute onset apraxia, aphasia, dysarthria, agnosia, amnesia, paralysis, loss of coordination, or loss of consciousness  Medication Review  Albuterol Sulfate, Aspirin-Acetaminophen-Caffeine, DULoxetine, Glucosamine-Chondroitin, Insulin Pen Needle, OVER THE COUNTER MEDICATION, Plecanatide, QUEtiapine, Semaglutide-Weight Management,  Senna, Valerian, Vitamin D (Ergocalciferol), betamethasone dipropionate, cetirizine-pseudoephedrine, diphenhydrAMINE, ferrous sulfate, gabapentin, lisinopril-hydrochlorothiazide, multivitamin with minerals, traMADol, and valACYclovir  History Review  Allergy: [name] has No Known Allergies. Drug: [name]  reports no history of drug use. Alcohol:  reports current alcohol use of about 1.0 standard drink per week. Tobacco:  reports that she has been smoking cigarettes. She has a 1.75 pack-year smoking history. She has never used smokeless tobacco. Social: [name]  reports that she has been smoking cigarettes. She has  a 1.75 pack-year smoking history. She has never used smokeless tobacco. She reports current alcohol use of about 1.0 standard drink per week. She reports that she does not use drugs. Medical:  has a past medical history of Allergy, Anemia, Anxiety, Arthritis, Fibromyalgia, GERD (gastroesophageal reflux disease), Hypertension, IBS (irritable bowel syndrome), Nephrolithiasis, and Plantar fasciitis of right foot. Surgical: 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 Family: family history includes Breast cancer (age of onset: 73) in her mother; Breast cancer (age of onset: 42) in her maternal aunt; Cancer in her maternal grandmother; Cancer (age of onset: 79) in her mother; Heart attack in her maternal grandfather and paternal grandfather; Heart disease in her father and maternal grandfather; Hypertension in her father; Lung cancer in her maternal grandmother; Multiple sclerosis in her maternal uncle; Obesity in her brother; Stroke in her father.  Laboratory Chemistry Profile   Renal Lab Results  Component Value Date   BUN 15 12/14/2019   CREATININE 0.71 12/14/2019   LABCREA 82 61/95/0932   BCR NOT APPLICABLE 67/12/4578   GFRAA 113 12/14/2019   GFRNONAA 97 12/14/2019    Hepatic Lab Results  Component Value Date   AST 14 12/14/2019   ALT 12 12/14/2019   ALBUMIN 4.5 03/05/2016   ALKPHOS 123 (H) 03/05/2016    Electrolytes Lab Results  Component Value Date   NA 141 12/14/2019   K 4.1 12/14/2019   CL 106 12/14/2019   CALCIUM 9.4 12/14/2019   MG 2.4 09/03/2019    Bone Lab Results  Component Value Date   VD25OH 52 09/03/2019    Inflammation (CRP: Acute Phase) (ESR: Chronic Phase) Lab Results  Component Value Date   CRP 0.9 03/04/2017   ESRSEDRATE 22 (H) 03/04/2017         Note: Above Lab results  reviewed.  Recent Imaging Review  MM 3D SCREEN BREAST BILATERAL CLINICAL DATA:  Screening.  EXAM: DIGITAL SCREENING BILATERAL MAMMOGRAM WITH TOMOSYNTHESIS AND CAD  TECHNIQUE: Bilateral screening digital craniocaudal and mediolateral oblique mammograms were obtained. Bilateral screening digital breast tomosynthesis was performed. The images were evaluated with computer-aided detection.  COMPARISON:  Previous exam(s).  ACR Breast Density Category a: The breast tissue is almost entirely fatty.  FINDINGS: There are no findings suspicious for malignancy.  IMPRESSION: No mammographic evidence of malignancy. A result letter of this screening mammogram will be mailed directly to the patient.  RECOMMENDATION: Screening mammogram in one year. (Code:[id number]-01Y)  BI-RADS CATEGORY  1: Negative.  Electronically Signed   By: Evangeline Dakin M.D.   On: 03/09/2020 15:29 Note: Reviewed        Physical Exam  General appearance: Well nourished, well developed, and well hydrated. In no apparent acute distress Mental status: Alert, oriented x 3 (person, place, & time)       Respiratory: No evidence of acute respiratory distress  Eyes: PERLA Vitals: BP (!) 143/94   Pulse 72   Temp (!) 97.2 F (36.2 C)   Ht '5\' 7"'  (1.702 m)   Wt 192 lb (87.1 kg)   SpO2 99%   BMI 30.07 kg/m  BMI: Estimated body mass index is 30.07 kg/m as calculated from the following:   Height as of this encounter: '5\' 7"'  (1.702 m).   Weight as of this encounter: 192 lb (87.1 kg). Ideal: Ideal body weight: 61.6 kg (135 lb 12.9 oz) Adjusted ideal body weight: 71.8 kg (158 lb 4.5 oz)  Assessment   Status Diagnosis  Controlled Controlled Controlled 1. Chronic pain syndrome   2. Chronic generalized pain (1ry area of Pain)   3. Fibromyalgia affecting multiple sites (2ry area of Pain)   4. Chronic shoulder pain (3ry area of Pain) (Bilateral) (R>L)   5. Chronic hip pain (4th area of Pain) (Bilateral) (R>L)   6.  Pharmacologic therapy   7. Chronic use of opiate for therapeutic purpose   8. Encounter for medication management      Updated Problems: Problem  Hyperlipidemia    Plan of Care  Problem-specific:  No problem-specific Assessment & Plan notes found for this encounter.  [name] has a current medication list which includes the following long-term medication(s): duloxetine, duloxetine, gabapentin, lisinopril-hydrochlorothiazide, quetiapine, quetiapine, and [START ON 09/02/2020] tramadol.  Pharmacotherapy (Medications Ordered): Meds ordered this encounter  Medications   traMADol (ULTRAM) 50 MG tablet    Sig: Take 1 tablet (50 mg total) by mouth every 6 (six) hours. Each refill must last 30 days.    Dispense:  120 tablet    Refill:  5    Not a duplicate. Do NOT delete! Dispense 1 day early if closed on refill date. Avoid benzodiazepines within 8 hours of opioids. Do not send refill requests.    Orders:  Orders Placed This Encounter  Procedures   Tox[employer]SU[employer] Select 13 (MW), Urine    Volume: 30 ml(s). Minimum 3 ml of urine is needed. Document temperature of fresh sample. Indications: Long term (current) use of opiate analgesic (X54.008)    Order Specific Question:   Release to patient    Answer:   Immediate    Follow-up plan:   Return in about 6 months (around 03/01/2021) for (M,W) (F2F) (MM).     Interventional options: Considering:   Diagnostic bilateral intra-articular shoulder joint injection  Diagnostic bilateral suprascapular nerve block  Possible bilateral suprascapular nerve RFA  Diagnostic bilateral intra-articular hip joint injection  Diagnostic/therapeutic bilateral trochanteric bursa injection  Diagnostic bilateral femoral nerve + obturator nerve block  Possible bilateral femoral nerve + obturator nerve RFA  Diagnostic/therapeutic Lidocaine infusion  Diagnostic/therapeutic Trigger point injection  Diagnostic bilateral cervical facet block  Diagnostic  bilateral cervical facet RFA    Palliative PRN treatment(s):   Diagnostic bilateral intra-articular hip joint injection #2 + bilateral trochanteric bursa injection #2, under fluoroscopic guidance and IV sedation     Recent Visits Date Type Provider Dept  06/01/20 Office Visit Milinda Pointer, MD [facility]  Showing recent visits within past 90 days and meeting all other requirements Today's Visits Date Type Provider Dept  08/25/20 Office Visit Milinda Pointer, MD [facility]  Showing today's visits and meeting all other requirements Future Appointments No visits were found meeting these conditions. Showing future appointments within next 90 days and meeting all other requirements I discussed the assessment and treatment plan with the patient. The patient was provided an opportunity  to ask questions and all were answered. The patient agreed with the plan and demonstrated an understanding of the instructions.  Patient advised to call back or seek an in-person evaluation if the symptoms or condition worsens.  Duration of encounter: 30 minutes.  Note by: Gaspar Cola, MD Date: 08/25/2020; Time: 6:05 PM

## 2020-08-25 ENCOUNTER — Ambulatory Visit: Payer: BC Managed Care – PPO | Attending: Pain Medicine | Admitting: Pain Medicine

## 2020-08-25 ENCOUNTER — Other Ambulatory Visit: Payer: Self-pay

## 2020-08-25 ENCOUNTER — Encounter: Payer: Self-pay | Admitting: Pain Medicine

## 2020-08-25 VITALS — BP 143/94 | HR 72 | Temp 97.2°F | Ht 67.0 in | Wt 192.0 lb

## 2020-08-25 DIAGNOSIS — M25512 Pain in left shoulder: Secondary | ICD-10-CM | POA: Diagnosis not present

## 2020-08-25 DIAGNOSIS — M25511 Pain in right shoulder: Secondary | ICD-10-CM

## 2020-08-25 DIAGNOSIS — Z79899 Other long term (current) drug therapy: Secondary | ICD-10-CM | POA: Diagnosis not present

## 2020-08-25 DIAGNOSIS — M25551 Pain in right hip: Secondary | ICD-10-CM

## 2020-08-25 DIAGNOSIS — M25552 Pain in left hip: Secondary | ICD-10-CM | POA: Insufficient documentation

## 2020-08-25 DIAGNOSIS — R52 Pain, unspecified: Secondary | ICD-10-CM | POA: Insufficient documentation

## 2020-08-25 DIAGNOSIS — M797 Fibromyalgia: Secondary | ICD-10-CM | POA: Insufficient documentation

## 2020-08-25 DIAGNOSIS — G8929 Other chronic pain: Secondary | ICD-10-CM | POA: Diagnosis not present

## 2020-08-25 DIAGNOSIS — G894 Chronic pain syndrome: Secondary | ICD-10-CM | POA: Diagnosis not present

## 2020-08-25 DIAGNOSIS — Z79891 Long term (current) use of opiate analgesic: Secondary | ICD-10-CM | POA: Insufficient documentation

## 2020-08-25 MED ORDER — TRAMADOL HCL 50 MG PO TABS: 50.0000 mg | ORAL_TABLET | Freq: Four times a day (QID) | ORAL | 5 refills | Status: DC

## 2020-08-25 NOTE — Progress Notes (Signed)
Nursing Pain Medication Assessment:  Safety precautions to be maintained throughout the outpatient stay will include: orient to surroundings, keep bed in low position, maintain call bell within reach at all times, provide assistance with transfer out of bed and ambulation.  Medication Inspection Compliance: Pill count conducted under aseptic conditions, in front of the patient. Neither the pills nor the bottle was removed from the patient's sight at any time. Once count was completed pills were immediately returned to the patient in their original bottle.  Medication: Tramadol (Ultram) Pill/Patch Count:  27 of 120 pills remain Pill/Patch Appearance: Markings consistent with prescribed medication Bottle Appearance: Standard pharmacy container. Clearly labeled. Filled Date: 7 / 12 / 2022 Last Medication intake:  Today Safety precautions to be maintained throughout the outpatient stay will include: orient to surroundings, keep bed in low position, maintain call bell within reach at all times, provide assistance with transfer out of bed and ambulation.

## 2020-08-30 LAB — TOXASSURE SELECT 13 (MW), URINE

## 2020-09-02 ENCOUNTER — Encounter: Payer: Self-pay | Admitting: Adult Health

## 2020-09-02 ENCOUNTER — Ambulatory Visit (INDEPENDENT_AMBULATORY_CARE_PROVIDER_SITE_OTHER): Payer: BC Managed Care – PPO | Admitting: Adult Health

## 2020-09-02 ENCOUNTER — Other Ambulatory Visit: Payer: Self-pay

## 2020-09-02 VITALS — BP 136/88 | HR 82 | Temp 97.6°F | Resp 16 | Ht 67.0 in | Wt 185.4 lb

## 2020-09-02 DIAGNOSIS — Z1329 Encounter for screening for other suspected endocrine disorder: Secondary | ICD-10-CM

## 2020-09-02 DIAGNOSIS — Z1389 Encounter for screening for other disorder: Secondary | ICD-10-CM | POA: Diagnosis not present

## 2020-09-02 DIAGNOSIS — E785 Hyperlipidemia, unspecified: Secondary | ICD-10-CM

## 2020-09-02 DIAGNOSIS — G8929 Other chronic pain: Secondary | ICD-10-CM

## 2020-09-02 DIAGNOSIS — Z0000 Encounter for general adult medical examination without abnormal findings: Secondary | ICD-10-CM | POA: Diagnosis not present

## 2020-09-02 DIAGNOSIS — Z136 Encounter for screening for cardiovascular disorders: Secondary | ICD-10-CM | POA: Diagnosis not present

## 2020-09-02 DIAGNOSIS — Z130 Encounter for screening for diseases of the blood and blood-forming organs and certain disorders involving the immune mechanism: Secondary | ICD-10-CM

## 2020-09-02 DIAGNOSIS — Z13220 Encounter for screening for lipoid disorders: Secondary | ICD-10-CM | POA: Diagnosis not present

## 2020-09-02 DIAGNOSIS — I10 Essential (primary) hypertension: Secondary | ICD-10-CM

## 2020-09-02 DIAGNOSIS — Z79899 Other long term (current) drug therapy: Secondary | ICD-10-CM

## 2020-09-02 DIAGNOSIS — Z131 Encounter for screening for diabetes mellitus: Secondary | ICD-10-CM

## 2020-09-02 DIAGNOSIS — G4701 Insomnia due to medical condition: Secondary | ICD-10-CM

## 2020-09-02 DIAGNOSIS — Z720 Tobacco use: Secondary | ICD-10-CM

## 2020-09-02 DIAGNOSIS — E663 Overweight: Secondary | ICD-10-CM

## 2020-09-02 DIAGNOSIS — R7309 Other abnormal glucose: Secondary | ICD-10-CM

## 2020-09-02 DIAGNOSIS — E559 Vitamin D deficiency, unspecified: Secondary | ICD-10-CM | POA: Diagnosis not present

## 2020-09-02 DIAGNOSIS — E611 Iron deficiency: Secondary | ICD-10-CM

## 2020-09-02 DIAGNOSIS — F1120 Opioid dependence, uncomplicated: Secondary | ICD-10-CM

## 2020-09-02 DIAGNOSIS — Z9884 Bariatric surgery status: Secondary | ICD-10-CM

## 2020-09-02 DIAGNOSIS — F411 Generalized anxiety disorder: Secondary | ICD-10-CM

## 2020-09-02 DIAGNOSIS — Z0001 Encounter for general adult medical examination with abnormal findings: Secondary | ICD-10-CM

## 2020-09-02 DIAGNOSIS — G894 Chronic pain syndrome: Secondary | ICD-10-CM

## 2020-09-02 MED ORDER — MOTEGRITY 2 MG PO TABS: ORAL_TABLET | ORAL | 0 refills | Status: DC

## 2020-09-02 MED ORDER — LISINOPRIL-HYDROCHLOROTHIAZIDE 20-12.5 MG PO TABS
ORAL_TABLET | ORAL | 3 refills | Status: DC
Start: 2020-09-02 — End: 2020-11-08

## 2020-09-02 NOTE — Patient Instructions (Addendum)
[name] , Thank you for taking time to come for your Annual Wellness Visit. I appreciate your ongoing commitment to your health goals. Please review the following plan we discussed and let me know if I can assist you in the future.   These are the goals we discussed:  Goals      Blood Pressure < 130/80     DIET - INCREASE WATER INTAKE     65+ fluid ounces daily      Exercise 150 min/wk Moderate Activity     Quit Smoking        This is a list of the screening recommended for you and due dates:  Health Maintenance  Topic Date Due   Zoster (Shingles) Vaccine (1 of 2) Never done   COVID-19 Vaccine (3 - Pfizer risk series) 05/27/2019   Flu Shot  10/15/2020*   Pneumococcal Vaccination (2 - PCV) 09/03/2031*   Pap Smear  08/30/2021   Mammogram  03/04/2022   Colon Cancer Screening  05/14/2022   Tetanus Vaccine  08/31/2026   Hepatitis C Screening: USPSTF Recommendation to screen - Ages 18-79 yo.  Completed   HIV Screening  Completed   HPV Vaccine  Aged Out  *Topic was postponed. The date shown is not the original due date.     Please send shingrix dates and covid 19 booster dates Recommend flu vaccine in Oct 2022  For blood pressure - increase current med to 2 tabs daily - [facility] script [facility]     Know what a healthy weight is for you (roughly BMI <25) and aim to maintain this  Aim for 7+ servings of fruits and vegetables daily  65-80+ fluid ounces of water or unsweet tea for healthy kidneys  Limit to max 1 drink of alcohol per day; avoid smoking/tobacco  Limit animal fats in diet for cholesterol and heart health - choose grass fed whenever available  Avoid highly processed foods, and foods high in saturated/trans fats  Aim for low stress - take time to unwind and care for your mental health  Aim for 150 min of moderate intensity exercise weekly for heart health, and weights twice weekly for bone health  Aim for 7-9 hours of sleep  daily    SMOKING CESSATION  [employer]  [phone] for more information or for a free program for smoking cessation help.   You can call QUIT SMART 1-800-QUIT-NOW for free nicotine patches or replacement therapy- if they are out- keep calling  [facility] Can call for smoking cessation classes, [phone]  If you have a smart phone, please look up Smoke Free app, this will help you stay on track and give you information about money you have saved, life that you have gained back and a ton of more information.     ADVANTAGES OF QUITTING SMOKING Within 20 minutes, blood pressure decreases. Your pulse is at normal level. After 8 hours, carbon monoxide levels in the blood return to normal. Your oxygen level increases. After 24 hours, the chance of having a heart attack starts to decrease. Your breath, hair, and body stop smelling like smoke. After 48 hours, damaged nerve endings begin to recover. Your sense of taste and smell improve. After 72 hours, the body is virtually free of nicotine. Your bronchial tubes relax and breathing becomes easier. After 2 to 12 weeks, lungs can hold more air. Exercise becomes easier and circulation improves. After 1 year, the risk of coronary heart  disease is cut in half. After 5 years, the risk of stroke falls to the same as a nonsmoker. After 10 years, the risk of lung cancer is cut in half and the risk of other cancers decreases significantly. After 15 years, the risk of coronary heart disease drops, usually to the level of a nonsmoker. You will have extra money to spend on things other than cigarettes.     A great goal to work towards is aiming to get in a serving daily of some of the most nutritionally dense foods - G- BOMBS daily

## 2020-09-02 NOTE — Progress Notes (Signed)
Complete Physical  Assessment and Plan:  Encounter for Annual Physical Exam with abnormal findings Due annually  Health Maintenance reviewed Healthy lifestyle reviewed and goals set  Essential hypertension - discussed goal <130/80, increase zestoretic, 4-6 week follow up - DASH diet, exercise and monitor at home. Call if greater than 130/80.  -     CBC with Differential/Platelet -     COMPLETE METABOLIC PANEL WITH GFR -     TSH -     Urinalysis, Routine w reflex microscopic -     Microalbumin / creatinine urine ratio  Mixed hyperlipidemia check lipids decrease fatty foods increase activity.  -     Lipid panel  Prediabetes On semaglutide Discussed disease progression and risks Discussed diet/exercise, weight management and risk modification -     Hemoglobin A1c  Fibromyalgia affecting multiple sites (Secondary Area of Pain) Continue follow up pain management  Chronic pain syndrome Continue follow up pain management  Long term current use of opiate analgesic Continue follow up pain management  Constipation, secondary to opioid Trulance, linzess causes diarrhea at times Would like to try motegrity; discussed and sent in  Continue squatty potty, fiber, water  Tobacco abuse Discussed risks associated with tobacco use and advised to reduce or quit Patient is ready to do so and plans to slow taper with husband Declines Chantix or other medication Will follow up at the next visit  Anxiety state Continue meds; well controlled Continue therapy as needed  Insomnia secondary to chronic pain  Insomnia- good sleep hygiene discussed, increase day time activity Continue medications  Medication management -     Magnesium  Vitamin D deficiency -     VITAMIN D 25 Hydroxy (Vit-D Deficiency, Fractures)  Overweight with co morbidities - BMI 29 Doing very well with wegovy Long discussion about weight loss, diet, and exercise Discussed ideal weight for height  Patient will  work on increasing exercise, fiber in diet Will follow up in 1 month  S/P gastric bypass, screening nutritional deficiencies -     Iron, Total/Total Iron Binding Cap -     Vitamin B12  Orders Placed This Encounter  Procedures   CBC with Differential/Platelet   COMPLETE METABOLIC PANEL WITH GFR   Magnesium   Lipid panel   TSH   Hemoglobin A1c   VITAMIN D 25 Hydroxy (Vit-D Deficiency, Fractures)   Vitamin B12   Iron, TIBC and Ferritin Panel   Microalbumin / creatinine urine ratio   Urinalysis, Routine w reflex microscopic   EKG 12-Lead     Discussed med's effects and SE's. Screening labs and tests as requested with regular follow-up as recommended. Over 40 minutes of exam, counseling, chart review, and complex, high level critical decision making was performed this visit.   Future Appointments  Date Time Provider [place]  02/27/2021  3:00 PM Milinda Pointer, MD [facility] None  09/06/2021 10:00 AM Liane Comber, NP [facility] None     HPI  55 y.o. female  presents for a complete physical. She has Anxiety state; Essential hypertension; Tobacco abuse; Medication management; Vitamin D deficiency; S/P gastric bypass; Cervical spondylosis without myelopathy; Fibromyalgia affecting multiple sites (2ry area of Pain); Spondylosis of lumbar spine; Chronic generalized pain (1ry area of Pain); Chronic pain syndrome; Long term current use of opiate analgesic; Chronic shoulder pain (3ry area of Pain) (Bilateral) (R>L); Chronic hip pain (4th area of Pain) (Bilateral) (R>L); Greater trochanteric bursitis of hips (Bilateral); Osteoarthritis of hips (Bilateral); Insomnia secondary to chronic pain; Abnormal glucose;  Hyperlipidemia; Pharmacologic therapy; Uncomplicated opioid dependence ([place]); Lumbar facet syndrome (Right); Spondylosis without myelopathy or radiculopathy, lumbosacral region; Chronic hip pain (Left); Chronic low back pain (Right) w/o sciatica; Iron deficiency; and Overweight  (BMI 25.0-29.9) on their problem list.  She is married, has a son and step children, no grandchildren. 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 Enjoys her job. 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   She has chronic pain, DJD, fibromyalgia and follows with pain clinic, Dr. Lowella Dandy. She is taking tramadol, gabapentin, excedrine, motrin managed by him.   She also has hx of anxiety and insomnia; had negative sleep study for organic causes, deemed chronic insomnia secondary to pain. She is prescribed cymbalta 90 mg, seroquel 75 mg QHS. She has followed with therapy, reports doing very well recently.   BMI is Body mass index is 29.04 kg/m., she is working on diet and exercise. s/p gastric bypass Sept 2013. She regained weight up to 255 lb 08/2018. She failed phentermine/topamax, fair results with saxenda but transitioned to [facility] for higher dose, has done well since, down 70 lb since that time. Does have constipation, had diarrhea with linzess, trulance (also bloating), would like to try motegrity.  Wt Readings from Last 3 Encounters:  09/02/20 185 lb 6.4 oz (84.1 kg)  08/25/20 192 lb (87.1 kg)  06/01/20 192 lb (87.1 kg)   She does smoke since her early 26s, only 1-2 a day. Receptive to quitting.   Her blood pressure has been controlled at home, today their BP is BP: 136/88  She does workout, slowly increasing, walking. She denies chest pain, shortness of breath, dizziness.  She is not on cholesterol medication and denies myalgias. Her cholesterol is at goal. The cholesterol last visit was:   Lab Results  Component Value Date   CHOL 141 09/03/2019   HDL 67 09/03/2019   LDLCALC 58 09/03/2019   TRIG 82 09/03/2019   CHOLHDL 2.1 09/03/2019   She has been working on diet and exercise for prediabetes, and denies paresthesia of the feet, polydipsia, polyuria and visual disturbances. Last A1C in the office was:  Lab Results  Component Value Date   HGBA1C 5.7 (H) 09/03/2019   Patient is on Vitamin D  supplement.   Lab Results  Component Value Date   VD25OH 52 09/03/2019     She has had elevated alk phos with normal Ab [place], GGT and bone scan in 2001. Has had negative autoimmune workup in the past.   She reports stopped iron supplement 2 months ago, was having constipation.  Lab Results  Component Value Date   IRON 82 09/03/2019   TIBC 430 09/03/2019   FERRITIN 6 (L) 09/03/2019   Lab Results  Component Value Date   [id number] 664 09/03/2019     Current Medications:  Current Outpatient Medications on File Prior to Visit  Medication Sig Dispense Refill   Albuterol Sulfate (PROVENTIL HFA IN) Inhale 108 mcg into the lungs as needed.     Aspirin-Acetaminophen-Caffeine (EXCEDRIN EXTRA STRENGTH PO) Take 1 tablet by mouth 3 (three) times daily as needed (headache).      betamethasone dipropionate (DIPROLENE) 0.05 % ointment Apply topically 2 (two) times daily. 30 g 0   cetirizine-pseudoephedrine (ZYRTEC-D) 5-120 MG tablet Take 1 tablet by mouth every 12 (twelve) hours as needed for allergies.      diphenhydrAMINE (BENADRYL) 25 MG tablet Take 25-50 mg by mouth 3 (three) times daily as needed for allergies.     DULoxetine (CYMBALTA) 30 MG capsule  TAKE 1 CAPSULE DAILY WITH 60 MG TABLET TO EQUAL 90 MG DAILY 90 capsule 3   DULoxetine (CYMBALTA) 60 MG capsule TAKE 1 CAPSULE DAILY WITH 30 MG TABLET TO EQUAL 90 MG 90 capsule 3   ferrous sulfate 325 (65 FE) MG EC tablet Take 325 mg by mouth every other day.      gabapentin (NEURONTIN) 300 MG capsule Take     1 to 2 capsules     3 x /day as      Needed for Chronic Pain 540 capsule 2   Glucosamine-Chondroitin (COSAMIN DS PO) Take 1,500 mg by mouth daily.     Multiple Vitamins-Minerals (MULTIVITAMIN WITH MINERALS) tablet Take 1 tablet by mouth daily. Marland Kitchen     NOVOFINE 32G X 6 MM MISC Needs daily for victoza 100 each 5   OVER THE COUNTER MEDICATION 540 mg.     QUEtiapine (SEROQUEL) 25 MG tablet Take three tablets by mouth every night at bedtime 270  tablet 2   Semaglutide-Weight Management (WEGOVY) 2.4 MG/0.75ML SOAJ Inject 2.4 mg into the skin once a week. 3 mL 3   Senna 30 MG MISC Take 30 mg by mouth daily. 4 tablets once per day     traMADol (ULTRAM) 50 MG tablet Take 1 tablet (50 mg total) by mouth every 6 (six) hours. Each refill must last 30 days. 120 tablet 5   valACYclovir (VALTREX) 500 MG tablet Take 1 tablet (500 mg total) by mouth daily as needed (for cold sores). 30 tablet 0   VALERIAN ROOT PO Take 1,000 mg by mouth.     Vitamin D, Ergocalciferol, (DRISDOL) 1.25 MG (50000 UNIT) CAPS capsule TAKE 1 CAPSULE TWO TIMES A WEEK 26 capsule 3   No current facility-administered medications on file prior to visit.   Health Maintenance:   Immunization History  Administered Date(s) Administered   Influenza Inj Mdck Quad With Preservative 10/23/2016, 12/14/2019   Influenza Split 10/02/2011, 10/16/2013, 11/15/2014   PFIZER(Purple Top)SARS-COV-2 Vaccination 04/03/2019, 04/29/2019   Pneumococcal Polysaccharide-23 10/02/2011   Tdap 08/30/2016    Tetanus: 2018 Pneumovax: 2013 Prevnar 13: N/A Flu vaccine: 11/2019, recommended in Oct Shingrix: reports has had, will send dates Covid 105: 2/2, pfizer - has had booster  LMP: post menopausal Pap: 08/2016 NEG HPV, due next year MGM: 03/04/2020 DEXA: 09/2013 -  Colonoscopy: 04/2017 normal EGD: 2013 Myoview stress test 10/2010 Sleep study 05/2018  Last Dental Exam: Dr. Quincy Simmonds Last Eye Exam: Dr Randall Hiss DERM Dr. Raquel Sarna, OB/GYN  Patient Care Team: Unk Pinto, MD as PCP - General (Internal Medicine) Ladene Artist, MD as Consulting Physician (Gastroenterology) Unk Pinto, MD as Referring Physician (Internal Medicine)  Medical History:  Past Medical History:  Diagnosis Date   Allergy    Anemia    Anxiety    Arthritis    Fibromyalgia    GERD (gastroesophageal reflux disease)    Hypertension    IBS (irritable bowel syndrome)    Nephrolithiasis    Plantar fasciitis  of right foot    wears boot    Allergies No Known Allergies  SURGICAL HISTORY She  has a past surgical history that includes Left shoulder arthroscopic surgery (Left); tummy tuck (2005); Breath tek h pylori (07/11/2011); Gastric Roux-En-Y (10/01/2011); Cholecystectomy (10/01/2011); and Colonoscopy.   FAMILY HISTORY Her family history includes Breast cancer (age of onset: 25) in her mother; Breast cancer (age of onset: 24) in her maternal aunt; Cancer in her maternal grandmother; Cancer (age of onset: 71) in her  mother; Heart attack in her maternal grandfather and paternal grandfather; Heart disease in her father and maternal grandfather; Hypertension in her father; Lung cancer in her maternal grandmother; Multiple sclerosis in her maternal uncle; Obesity in her brother; Stroke in her father.   SOCIAL HISTORY She  reports that she has been smoking cigarettes. She started smoking about 31 years ago. She has a 4.65 pack-year smoking history. She has never used smokeless tobacco. She reports current alcohol use of about 1.0 standard drink per week. She reports that she does not use drugs.   Review of Systems: Review of Systems  Constitutional:  Negative for chills, diaphoresis, fever, malaise/fatigue and weight loss.  HENT:  Negative for hearing loss and tinnitus.   Eyes: Negative.  Negative for blurred vision and double vision.  Respiratory: Negative.  Negative for cough, sputum production, shortness of breath and wheezing.   Cardiovascular: Negative.  Negative for chest pain, palpitations, orthopnea, claudication, leg swelling and PND.  Gastrointestinal:  Positive for constipation. Negative for abdominal pain, blood in stool, diarrhea, heartburn, melena, nausea and vomiting.  Genitourinary: Negative.   Musculoskeletal:  Positive for back pain, joint pain, myalgias and neck pain. Negative for falls.  Skin: Negative.  Negative for rash.  Neurological: Negative.  Negative for dizziness,  tingling, sensory change, weakness and headaches.  Endo/Heme/Allergies:  Negative for polydipsia.  Psychiatric/Behavioral: Negative.  Negative for depression, memory loss, substance abuse and suicidal ideas. The patient is not nervous/anxious and does not have insomnia.   All other systems reviewed and are negative.  Physical Exam: Estimated body mass index is 29.04 kg/m as calculated from the following:   Height as of this encounter: '5\' 7"'  (1.702 m).   Weight as of this encounter: 185 lb 6.4 oz (84.1 kg). BP 136/88   Pulse 82   Temp 97.6 F (36.4 C)   Resp 16   Ht '5\' 7"'  (1.702 m)   Wt 185 lb 6.4 oz (84.1 kg)   SpO2 98%   BMI 29.04 kg/m  General Appearance: Well nourished, in no apparent distress.  Eyes: PERRLA, EOMs, conjunctiva no swelling or erythema Sinuses: No Frontal/maxillary tenderness  ENT/Mouth: Ext aud canals clear, normal light reflex with TMs without erythema, bulging. Good dentition. No erythema, swelling, or exudate on post pharynx. Tonsils not swollen or erythematous. Hearing normal.  Neck: Supple, thyroid normal. No bruits  Respiratory: Respiratory effort normal, BS equal bilaterally without rales, rhonchi, wheezing or stridor.  Cardio: RRR without murmurs, rubs or gallops. Brisk peripheral pulses without edema.  Chest: symmetric, with normal excursions and percussion.  Breasts: defer, no concerns, getting mammograms Abdomen: Soft, nontender, no guarding, rebound, hernias, masses, or organomegaly.  Lymphatics: Non tender without lymphadenopathy.  Genitourinary: defer Musculoskeletal: Full ROM all peripheral extremities,5/5 strength, and normal gait.  Skin: Warm, dry without rashes, lesions, ecchymosis. Neuro: Cranial nerves intact, reflexes equal bilaterally. Normal muscle tone, no cerebellar symptoms. Sensation intact.  Psych: Awake and oriented X 3, normal affect, Insight and Judgment appropriate.   EKG:  WNL  Izora Ribas, NP 11:52 AM [facility]

## 2020-09-03 ENCOUNTER — Other Ambulatory Visit: Payer: Self-pay | Admitting: Adult Health

## 2020-09-03 DIAGNOSIS — E611 Iron deficiency: Secondary | ICD-10-CM

## 2020-09-03 LAB — CBC WITH DIFFERENTIAL/PLATELET
Absolute Monocytes: 490 cells/uL (ref 200–950)
Basophils Absolute: 31 cells/uL (ref 0–200)
Basophils Relative: 0.6 %
Eosinophils Absolute: 143 cells/uL (ref 15–500)
Eosinophils Relative: 2.8 %
HCT: 39.8 % (ref 35.0–45.0)
Hemoglobin: 12.7 g/dL (ref 11.7–15.5)
Lymphs Abs: 1617 cells/uL (ref 850–3900)
MCH: 27.5 pg (ref 27.0–33.0)
MCHC: 31.9 g/dL — ABNORMAL LOW (ref 32.0–36.0)
MCV: 86.3 fL (ref 80.0–100.0)
MPV: 9.7 fL (ref 7.5–12.5)
Monocytes Relative: 9.6 %
Neutro Abs: 2820 cells/uL (ref 1500–7800)
Neutrophils Relative %: 55.3 %
Platelets: 363 10*3/uL (ref 140–400)
RBC: 4.61 10*6/uL (ref 3.80–5.10)
RDW: 14.5 % (ref 11.0–15.0)
Total Lymphocyte: 31.7 %
WBC: 5.1 10*3/uL (ref 3.8–10.8)

## 2020-09-03 LAB — URINALYSIS, ROUTINE W REFLEX MICROSCOPIC
Bilirubin Urine: NEGATIVE
Glucose, UA: NEGATIVE
Hgb urine dipstick: NEGATIVE
Ketones, ur: NEGATIVE
Leukocytes,Ua: NEGATIVE
Nitrite: NEGATIVE
Protein, ur: NEGATIVE
Specific Gravity, Urine: 1.017 (ref 1.001–1.035)
pH: 6.5 (ref 5.0–8.0)

## 2020-09-03 LAB — LIPID PANEL
Cholesterol: 163 mg/dL (ref <200)
HDL: 72 mg/dL (ref >=50)
LDL Cholesterol (Calc): 73 mg/dL (calc)
Non-HDL Cholesterol (Calc): 91 mg/dL (calc) (ref <130)
Total CHOL/HDL Ratio: 2.3 (calc) (ref <5.0)
Triglycerides: 92 mg/dL (ref <150)

## 2020-09-03 LAB — COMPLETE METABOLIC PANEL WITH GFR
AG Ratio: 1.8 (calc) (ref 1.0–2.5)
ALT: 13 U/L (ref 6–29)
AST: 14 U/L (ref 10–35)
Albumin: 4.4 g/dL (ref 3.6–5.1)
Alkaline phosphatase (APISO): 136 U/L (ref 37–153)
BUN: 10 mg/dL (ref 7–25)
CO2: 34 mmol/L — ABNORMAL HIGH (ref 20–32)
Calcium: 9.6 mg/dL (ref 8.6–10.4)
Chloride: 103 mmol/L (ref 98–110)
Creat: 0.75 mg/dL (ref 0.50–1.03)
Globulin: 2.4 g/dL (calc) (ref 1.9–3.7)
Glucose, Bld: 82 mg/dL (ref 65–99)
Potassium: 3.9 mmol/L (ref 3.5–5.3)
Sodium: 141 mmol/L (ref 135–146)
Total Bilirubin: 0.4 mg/dL (ref 0.2–1.2)
Total Protein: 6.8 g/dL (ref 6.1–8.1)
eGFR: 95 mL/min/{1.73_m2} (ref >=60)

## 2020-09-03 LAB — IRON,TIBC AND FERRITIN PANEL
%SAT: 16 % (calc) (ref 16–45)
Ferritin: 6 ng/mL — ABNORMAL LOW (ref 16–232)
Iron: 59 ug/dL (ref 45–160)
TIBC: 375 mcg/dL (calc) (ref 250–450)

## 2020-09-03 LAB — MICROALBUMIN / CREATININE URINE RATIO
Creatinine, Urine: 105 mg/dL (ref 20–275)
Microalb Creat Ratio: 2 mcg/mg creat (ref <30)
Microalb, Ur: 0.2 mg/dL

## 2020-09-03 LAB — VITAMIN D 25 HYDROXY (VIT D DEFICIENCY, FRACTURES): Vit D, 25-Hydroxy: 91 ng/mL (ref 30–100)

## 2020-09-03 LAB — MAGNESIUM: Magnesium: 2.2 mg/dL (ref 1.5–2.5)

## 2020-09-03 LAB — HEMOGLOBIN A1C
Hgb A1c MFr Bld: 5.4 % of total Hgb (ref <5.7)
Mean Plasma Glucose: 108 mg/dL
eAG (mmol/L): 6 mmol/L

## 2020-09-03 LAB — TSH: TSH: 0.61 mIU/L

## 2020-09-03 LAB — VITAMIN B12: Vitamin B-12: 853 pg/mL (ref 200–1100)

## 2020-09-03 MED ORDER — FUSION PLUS PO CAPS: ORAL_CAPSULE | ORAL | 0 refills | Status: DC

## 2020-09-14 ENCOUNTER — Encounter: Payer: BC Managed Care – PPO | Admitting: Pain Medicine

## 2020-09-27 ENCOUNTER — Other Ambulatory Visit: Payer: Self-pay | Admitting: Adult Health

## 2020-10-01 ENCOUNTER — Other Ambulatory Visit: Payer: Self-pay | Admitting: Adult Health

## 2020-10-17 NOTE — Progress Notes (Deleted)
Assessment and Plan:  There are no diagnoses linked to this encounter.    Further disposition pending results of labs. Discussed med's effects and SE's.   Over 30 minutes of exam, counseling, chart review, and critical decision making was performed.   Future Appointments  Date Time Provider Department Center  10/18/2020  4:00 PM Revonda Humphrey, NP [facility] None  02/27/2021  3:00 PM Delano Metz, MD [facility] None  03/09/2021  4:00 PM Revonda Humphrey, NP [facility] None  09/06/2021 10:00 AM Judd Gaudier, NP [facility] None    ------------------------------------------------------------------------------------------------------------------   HPI There were no vitals taken for this visit. 55 y.o.female presents for  Past Medical History:  Diagnosis Date   Allergy    Anemia    Anxiety    Arthritis    Fibromyalgia    GERD (gastroesophageal reflux disease)    Hypertension    IBS (irritable bowel syndrome)    Nephrolithiasis    Plantar fasciitis of right foot    wears boot      No Known Allergies  Current Outpatient Medications on File Prior to Visit  Medication Sig   Albuterol Sulfate (PROVENTIL HFA IN) Inhale 108 mcg into the lungs as needed.   Aspirin-Acetaminophen-Caffeine (EXCEDRIN EXTRA STRENGTH PO) Take 1 tablet by mouth 3 (three) times daily as needed (headache).    betamethasone dipropionate (DIPROLENE) 0.05 % ointment Apply topically 2 (two) times daily.   cetirizine-pseudoephedrine (ZYRTEC-D) 5-120 MG tablet Take 1 tablet by mouth every 12 (twelve) hours as needed for allergies.    diphenhydrAMINE (BENADRYL) 25 MG tablet Take 25-50 mg by mouth 3 (three) times daily as needed for allergies.   DULoxetine (CYMBALTA) 30 MG capsule TAKE 1 CAPSULE DAILY WITH 60 MG TABLET TO EQUAL 90 MG DAILY   DULoxetine (CYMBALTA) 60 MG capsule TAKE 1 CAPSULE DAILY WITH 30 MG TABLET TO EQUAL 90 MG   ferrous sulfate 325 (65 FE) MG EC tablet Take 325 mg by mouth every other day.     gabapentin (NEURONTIN) 300 MG capsule Take     1 to 2 capsules     3 x /day as      Needed for Chronic Pain   Glucosamine-Chondroitin (COSAMIN DS PO) Take 1,500 mg by mouth daily.   Iron-FA-B Cmp-C-Biot-Probiotic (FUSION PLUS) CAPS Take 1 cap every 2-3 days as tolerated for chronic iron deficiency.   lisinopril-hydrochlorothiazide (ZESTORETIC) 20-12.5 MG tablet TAKE 2 TABLETS DAILY FOR BLOOD PRESSURE   Multiple Vitamins-Minerals (MULTIVITAMIN WITH MINERALS) tablet Take 1 tablet by mouth daily. Marland Kitchen   NOVOFINE 32G X 6 MM MISC Needs daily for victoza   OVER THE COUNTER MEDICATION 540 mg.   Prucalopride Succinate (MOTEGRITY) 2 MG TABS TAKE ONE TABLET BY MOUTH DAILY FOR CHRONIC CONSTIPATION   QUEtiapine (SEROQUEL) 25 MG tablet Take three tablets by mouth every night at bedtime   Senna 30 MG MISC Take 30 mg by mouth daily. 4 tablets once per day   traMADol (ULTRAM) 50 MG tablet Take 1 tablet (50 mg total) by mouth every 6 (six) hours. Each refill must last 30 days.   valACYclovir (VALTREX) 500 MG tablet Take 1 tablet (500 mg total) by mouth daily as needed (for cold sores).   VALERIAN ROOT PO Take 1,000 mg by mouth.   Vitamin D, Ergocalciferol, (DRISDOL) 1.25 MG (50000 UNIT) CAPS capsule TAKE 1 CAPSULE TWO TIMES A WEEK   WEGOVY 2.4 MG/0.75ML SOAJ INJECT 2.4 MG UNDER THE SKIN ONCE A WEEK   No current facility-administered  medications on file prior to visit.    ROS: all negative except above.   Physical Exam:  There were no vitals taken for this visit.  General Appearance: Well nourished, in no apparent distress. Eyes: PERRLA, EOMs, conjunctiva no swelling or erythema Sinuses: No Frontal/maxillary tenderness ENT/Mouth: Ext aud canals clear, TMs without erythema, bulging. No erythema, swelling, or exudate on post pharynx.  Tonsils not swollen or erythematous. Hearing normal.  Neck: Supple, thyroid normal.  Respiratory: Respiratory effort normal, BS equal bilaterally without rales, rhonchi,  wheezing or stridor.  Cardio: RRR with no MRGs. Brisk peripheral pulses without edema.  Abdomen: Soft, + BS.  Non tender, no guarding, rebound, hernias, masses. Lymphatics: Non tender without lymphadenopathy.  Musculoskeletal: Full ROM, 5/5 strength, normal gait.  Skin: Warm, dry without rashes, lesions, ecchymosis.  Neuro: Cranial nerves intact. Normal muscle tone, no cerebellar symptoms. Sensation intact.  Psych: Awake and oriented X 3, normal affect, Insight and Judgment appropriate.     Revonda Humphrey, NP 1:43 PM [facility]

## 2020-10-18 ENCOUNTER — Ambulatory Visit: Payer: BC Managed Care – PPO | Admitting: Nurse Practitioner

## 2020-10-18 DIAGNOSIS — I10 Essential (primary) hypertension: Secondary | ICD-10-CM

## 2020-11-03 NOTE — Progress Notes (Signed)
Assessment and Plan:  Wendy Valencia was seen today for follow-up.  Diagnoses and all orders for this visit:  Essential hypertension -   D/C zestoretic   - Start losartan-hydrochlorothiazide (HYZAAR) 50-12.5 MG tablet; Take 1 tablet by mouth daily. -     CBC with Differential/Platelet  Flu vaccine need -     Flu Vaccine QUAD 6+ mos PF IM (Fluarix Quad PF)  Fibromyalgia  Tried to wean off Cymbalta but when she got to 30 mg daily she was have mood changes.  Will decrease to 60 mg daily to determine if controlling mood and pain.   Overweight (BMI 25.0-29.9)  Continue Wegovy, diet and exercise  Vitamin D deficiency  Check VitD, continue Vit D supplementation  S/P gastric bypass -     COMPLETE METABOLIC PANEL WITH GFR -     VITAMIN D 25 Hydroxy (Vit-D Deficiency, Fractures) -     Vitamin B12 -     Magnesium -     Iron, Total/Total Iron Binding Cap      Further disposition pending results of labs. Discussed med's effects and SE's.   Over 30 minutes of exam, counseling, chart review, and critical decision making was performed.   Future Appointments  Date Time Provider Department Center  02/27/2021  3:00 PM Delano Metz, MD [facility] None  03/09/2021  4:00 PM Revonda Humphrey, NP [facility] None  09/06/2021 10:00 AM Judd Gaudier, NP [facility] None    ------------------------------------------------------------------------------------------------------------------   HPI BP 140/82   Pulse 86   Temp (!) 97.5 F (36.4 C)   Wt 174 lb 6.4 oz (79.1 kg)   SpO2 99%   BMI 27.31 kg/m  55 y.o.female presents for blood pressure recheck.   She had her blood pressure medication zestoretic increased 8 weeks ago. Currently taking zestoretic 20/12.5 mg 2 tabs daily. She is still getting headaches. Has not been checking BP at home.  BP Readings from Last 3 Encounters:  11/08/20 140/82  09/02/20 136/88  08/25/20 (!) 143/94     BMI is Body mass index is 27.31 kg/m., she has been working  on diet and exercise. She is on Wegovy 2.4 mg SQ QW . She is s/p gastric bypass 2014. She is doing yoga and trying kickboxing.  Wt Readings from Last 3 Encounters:  11/08/20 174 lb 6.4 oz (79.1 kg)  09/02/20 185 lb 6.4 oz (84.1 kg)  08/25/20 192 lb (87.1 kg)     Past Medical History:  Diagnosis Date   Allergy    Anemia    Anxiety    Arthritis    Fibromyalgia    GERD (gastroesophageal reflux disease)    Hypertension    IBS (irritable bowel syndrome)    Nephrolithiasis    Plantar fasciitis of right foot    wears boot      No Known Allergies  Current Outpatient Medications on File Prior to Visit  Medication Sig   Albuterol Sulfate (PROVENTIL HFA IN) Inhale 108 mcg into the lungs as needed.   Aspirin-Acetaminophen-Caffeine (EXCEDRIN EXTRA STRENGTH PO) Take 1 tablet by mouth 3 (three) times daily as needed (headache).    betamethasone dipropionate (DIPROLENE) 0.05 % ointment Apply topically 2 (two) times daily.   cetirizine-pseudoephedrine (ZYRTEC-D) 5-120 MG tablet Take 1 tablet by mouth every 12 (twelve) hours as needed for allergies.    diphenhydrAMINE (BENADRYL) 25 MG tablet Take 25-50 mg by mouth 3 (three) times daily as needed for allergies.   DULoxetine (CYMBALTA) 30 MG capsule TAKE 1 CAPSULE DAILY  WITH 60 MG TABLET TO EQUAL 90 MG DAILY   DULoxetine (CYMBALTA) 60 MG capsule TAKE 1 CAPSULE DAILY WITH 30 MG TABLET TO EQUAL 90 MG   ferrous sulfate 325 (65 FE) MG EC tablet Take 325 mg by mouth every other day. Taking twice a week   gabapentin (NEURONTIN) 300 MG capsule Take     1 to 2 capsules     3 x /day as      Needed for Chronic Pain   Iron-FA-B Cmp-C-Biot-Probiotic (FUSION PLUS) CAPS Take 1 cap every 2-3 days as tolerated for chronic iron deficiency.   lisinopril-hydrochlorothiazide (ZESTORETIC) 20-12.5 MG tablet TAKE 2 TABLETS DAILY FOR BLOOD PRESSURE   Multiple Vitamins-Minerals (MULTIVITAMIN WITH MINERALS) tablet Take 1 tablet by mouth daily. .   Prucalopride Succinate  (MOTEGRITY) 2 MG TABS TAKE ONE TABLET BY MOUTH DAILY FOR CHRONIC CONSTIPATION   QUEtiapine (SEROQUEL) 25 MG tablet Take three tablets by mouth every night at bedtime   Senna 30 MG MISC Take 30 mg by mouth daily. 4 tablets once per day   traMADol (ULTRAM) 50 MG tablet Take 1 tablet (50 mg total) by mouth every 6 (six) hours. Each refill must last 30 days.   valACYclovir (VALTREX) 500 MG tablet Take 1 tablet (500 mg total) by mouth daily as needed (for cold sores).   Vitamin D, Ergocalciferol, (DRISDOL) 1.25 MG (50000 UNIT) CAPS capsule TAKE 1 CAPSULE TWO TIMES A WEEK   WEGOVY 2.4 MG/0.75ML SOAJ INJECT 2.4 MG UNDER THE SKIN ONCE A WEEK   Glucosamine-Chondroitin (COSAMIN DS PO) Take 1,500 mg by mouth daily. (Patient not taking: Reported on 11/08/2020)   NOVOFINE 32G X 6 MM MISC Needs daily for victoza   OVER THE COUNTER MEDICATION 540 mg.   VALERIAN ROOT PO Take 1,000 mg by mouth. (Patient not taking: Reported on 11/08/2020)   No current facility-administered medications on file prior to visit.    Review of Systems  Constitutional:  Negative for chills, fever and weight loss.  HENT:  Negative for congestion and hearing loss.   Eyes:  Negative for blurred vision and double vision.  Respiratory:  Negative for cough and shortness of breath.   Cardiovascular:  Negative for chest pain, palpitations, orthopnea and leg swelling.  Gastrointestinal:  Negative for abdominal pain, constipation, diarrhea, heartburn, nausea and vomiting.  Genitourinary:  Negative for dysuria.  Musculoskeletal:  Negative for falls, joint pain and myalgias.  Skin:  Negative for rash.  Neurological:  Negative for dizziness, tingling, tremors, loss of consciousness and headaches.  Psychiatric/Behavioral:  Negative for depression, memory loss and suicidal ideas. The patient is nervous/anxious (worse when tried to wean off Cymbalta).   .   Physical Exam:  BP 140/82   Pulse 86   Temp (!) 97.5 F (36.4 C)   Wt 174 lb 6.4  oz (79.1 kg)   SpO2 99%   BMI 27.31 kg/m   General Appearance: Well nourished, in no apparent distress. Eyes: PERRLA, EOMs, conjunctiva no swelling or erythema Sinuses: No Frontal/maxillary tenderness ENT/Mouth: Ext aud canals clear, TMs without erythema, bulging. No erythema, swelling, or exudate on post pharynx.  Tonsils not swollen or erythematous. Hearing normal.  Neck: Supple, thyroid normal.  Respiratory: Respiratory effort normal, BS equal bilaterally without rales, rhonchi, wheezing or stridor.  Cardio: RRR with no MRGs. Brisk peripheral pulses without edema.  Abdomen: Soft, + BS.  Non tender, no guarding, rebound, hernias, masses. Lymphatics: Non tender without lymphadenopathy.  Musculoskeletal: Full ROM, 5/5 strength, normal gait.  Skin: Warm, dry without rashes, lesions, ecchymosis.  Neuro: Cranial nerves intact. Normal muscle tone, no cerebellar symptoms. Sensation intact.  Psych: Awake and oriented X 3, normal affect, Insight and Judgment appropriate.     Revonda Humphrey, NP 4:20 PM [facility]

## 2020-11-08 ENCOUNTER — Other Ambulatory Visit: Payer: Self-pay

## 2020-11-08 ENCOUNTER — Ambulatory Visit (INDEPENDENT_AMBULATORY_CARE_PROVIDER_SITE_OTHER): Payer: BC Managed Care – PPO | Admitting: Nurse Practitioner

## 2020-11-08 ENCOUNTER — Encounter: Payer: Self-pay | Admitting: Nurse Practitioner

## 2020-11-08 VITALS — BP 140/82 | HR 86 | Temp 97.5°F | Wt 174.4 lb

## 2020-11-08 DIAGNOSIS — E559 Vitamin D deficiency, unspecified: Secondary | ICD-10-CM

## 2020-11-08 DIAGNOSIS — Z9884 Bariatric surgery status: Secondary | ICD-10-CM

## 2020-11-08 DIAGNOSIS — E663 Overweight: Secondary | ICD-10-CM

## 2020-11-08 DIAGNOSIS — Z23 Encounter for immunization: Secondary | ICD-10-CM

## 2020-11-08 DIAGNOSIS — M797 Fibromyalgia: Secondary | ICD-10-CM

## 2020-11-08 DIAGNOSIS — I10 Essential (primary) hypertension: Secondary | ICD-10-CM | POA: Diagnosis not present

## 2020-11-08 DIAGNOSIS — Z79899 Other long term (current) drug therapy: Secondary | ICD-10-CM | POA: Diagnosis not present

## 2020-11-08 MED ORDER — LOSARTAN POTASSIUM-HCTZ 50-12.5 MG PO TABS: 1.0000 | ORAL_TABLET | Freq: Every day | ORAL | 11 refills | Status: DC

## 2020-11-08 NOTE — Patient Instructions (Signed)
Losartan; Hydrochlorothiazide Tablets What is this medication? LOSARTAN; HYDROCHLOROTHIAZIDE (loe SAR tan; hye droe klor oh THYE a zide) treats high blood pressure. It may also be used to prevent a stroke in people with heart disease and high blood pressure. It relaxes your blood vessels and helps your kidneys remove more fluid through the urine, which lowers blood pressure. This medication is a combination of an ARB and diuretic. This medicine may be used for other purposes; ask your health care provider or pharmacist if you have questions. COMMON BRAND NAME(S): Hyzaar What should I tell my care team before I take this medication? They need to know if you have any of these conditions: Decreased urine Diabetes If you are on a special diet, like a low-salt diet Immune system problems, like lupus Kidney disease Liver disease An unusual or allergic reaction to losartan, hydrochlorothiazide, sulfa drugs, other medications, foods, dyes, or preservatives Pregnant or trying to get pregnant Breast-feeding How should I use this medication? Take this medication by mouth. Take it as directed on the prescription label at the same time every day. You can take it with or without food. If it upsets your stomach, take it with food. Keep taking it unless your care team tells you to stop. Talk to your care team about the use of this drug in children. Special care may be needed. Overdosage: If you think you have taken too much of this medicine contact a poison control center or emergency room at once. NOTE: This medicine is only for you. Do not share this medicine with others. What if I miss a dose? If you miss a dose, take it as soon as you can. If it is almost time for your next dose, take only that dose. Do not take double or extra doses. What may interact with this medication? Do not take this medication with any of the following: Cidofovir Dofetilide Tranylcypromine This medication may also interact  with the following: Barbiturates, like phenobarbital Blood pressure medications Celecoxib Diuretics, especially triamterene, spironolactone or amiloride Fluconazole Lithium Medications for diabetes Medications that relax the muscles for surgery Narcotic medications for pain NSAIDs, medications for pain and inflammation, like ibuprofen or naproxen Potassium salts or potassium supplements Rifampin Some cholesterol-lowering medications like cholestyramine or colestipol Steroid medications like prednisone or cortisone This list may not describe all possible interactions. Give your health care provider a list of all the medicines, herbs, non-prescription drugs, or dietary supplements you use. Also tell them if you smoke, drink alcohol, or use illegal drugs. Some items may interact with your medicine. What should I watch for while using this medication? Check your blood pressure regularly while you are taking this medication. Ask your care team what your blood pressure should be and when you should contact them. When you check your blood pressure, write down the measurements to show your care team. If you are taking this medication for a long time, you must visit your care team for regular checks on your progress. Make sure you schedule appointments on a regular basis. You must not get dehydrated. Ask your care team how much fluid you need to drink a day. Check with them if you get an attack of severe diarrhea, nausea and vomiting, or if you sweat a lot. The loss of too much body fluid can make it dangerous for you to take this medication. Women should inform their doctor if they wish to become pregnant or think they might be pregnant. There is a potential for serious side   effects to an unborn child, particularly in the second or third trimester. Talk to your care team or pharmacist for more information. You may get drowsy or dizzy. Do not drive, use machinery, or do anything that needs mental  alertness until you know how this medication affects you. Do not stand or sit up quickly, especially if you are an older patient. This reduces the risk of dizzy or fainting spells. Alcohol can make you more drowsy and dizzy. Avoid alcoholic drinks. This medication may increase blood sugar. Ask your care team if changes in diet or medications are needed if you have diabetes. Talk to your care team about your risk of skin cancer. You may be more at risk for skin cancer if you take this medication. This medication can make you more sensitive to the sun. Keep out of the sun. If you cannot avoid being in the sun, wear protective clothing and use sunscreen. Do not use sun lamps or tanning beds/booths. Avoid salt substitutes unless you are told otherwise by your care team. Do not treat yourself for coughs, colds, or pain while you are taking this medication without asking your care team for advice. Some ingredients may increase your blood pressure. What side effects may I notice from receiving this medication? Side effects that you should report to your care team as soon as possible: Allergic reactions-skin rash, itching, hives, swelling of the face, lips, tongue, or throat Dehydration-increased thirst, dry mouth, feeling faint or lightheaded, headache, dark yellow or brown urine Gout-severe pain, redness, warmth, or swelling in the joints, such as the big toe Kidney injury-decrease in the amount of urine, swelling of the ankles, hands, or feet Low blood pressure-dizziness, feeling faint or lightheaded, blurry vision Low potassium level-muscle pain or cramps, unusual weakness, fatigue, fast or irregular heartbeat, constipation Sudden eye pain or change in vision such as blurred vision, seeing halos around lights, vision loss Side effects that usually do not require medical attention (report to your care team if they continue or are bothersome): Change in sex drive or performance Dizziness Headache Runny  or stuffy nose Upset stomach This list may not describe all possible side effects. Call your doctor for medical advice about side effects. You may report side effects to FDA at 1-800-FDA-1088. Where should I keep my medication? Keep out of the reach of children and pets. Store at room temperature between 15 and 30 degrees C (59 and 86 degrees F). Protect from light. Keep the container tightly closed. Throw away any unused medication after the expiration date. NOTE: This sheet is a summary. It may not cover all possible information. If you have questions about this medicine, talk to your doctor, pharmacist, or health care provider.  2022 [employer] (2020-01-29 10:46:42)  

## 2020-11-09 ENCOUNTER — Other Ambulatory Visit: Payer: Self-pay | Admitting: Adult Health

## 2020-11-09 LAB — COMPLETE METABOLIC PANEL WITH GFR
AG Ratio: 1.6 (calc) (ref 1.0–2.5)
ALT: 11 U/L (ref 6–29)
AST: 15 U/L (ref 10–35)
Albumin: 4.3 g/dL (ref 3.6–5.1)
Alkaline phosphatase (APISO): 134 U/L (ref 37–153)
BUN: 14 mg/dL (ref 7–25)
CO2: 29 mmol/L (ref 20–32)
Calcium: 9.8 mg/dL (ref 8.6–10.4)
Chloride: 104 mmol/L (ref 98–110)
Creat: 0.71 mg/dL (ref 0.50–1.03)
Globulin: 2.7 g/dL (calc) (ref 1.9–3.7)
Glucose, Bld: 84 mg/dL (ref 65–99)
Potassium: 3.7 mmol/L (ref 3.5–5.3)
Sodium: 141 mmol/L (ref 135–146)
Total Bilirubin: 0.3 mg/dL (ref 0.2–1.2)
Total Protein: 7 g/dL (ref 6.1–8.1)
eGFR: 101 mL/min/{1.73_m2} (ref >=60)

## 2020-11-09 LAB — VITAMIN B12: Vitamin B-12: 772 pg/mL (ref 200–1100)

## 2020-11-09 LAB — CBC WITH DIFFERENTIAL/PLATELET
Absolute Monocytes: 545 cells/uL (ref 200–950)
Basophils Absolute: 49 cells/uL (ref 0–200)
Basophils Relative: 0.9 %
Eosinophils Absolute: 81 cells/uL (ref 15–500)
Eosinophils Relative: 1.5 %
HCT: 39.5 % (ref 35.0–45.0)
Hemoglobin: 12.8 g/dL (ref 11.7–15.5)
Lymphs Abs: 1782 cells/uL (ref 850–3900)
MCH: 28.1 pg (ref 27.0–33.0)
MCHC: 32.4 g/dL (ref 32.0–36.0)
MCV: 86.6 fL (ref 80.0–100.0)
MPV: 9.9 fL (ref 7.5–12.5)
Monocytes Relative: 10.1 %
Neutro Abs: 2943 cells/uL (ref 1500–7800)
Neutrophils Relative %: 54.5 %
Platelets: 380 10*3/uL (ref 140–400)
RBC: 4.56 10*6/uL (ref 3.80–5.10)
RDW: 14.7 % (ref 11.0–15.0)
Total Lymphocyte: 33 %
WBC: 5.4 10*3/uL (ref 3.8–10.8)

## 2020-11-09 LAB — IRON, TOTAL/TOTAL IRON BINDING CAP
%SAT: 14 % (calc) — ABNORMAL LOW (ref 16–45)
Iron: 51 ug/dL (ref 45–160)
TIBC: 374 mcg/dL (calc) (ref 250–450)

## 2020-11-09 LAB — MAGNESIUM: Magnesium: 2.3 mg/dL (ref 1.5–2.5)

## 2020-11-09 LAB — VITAMIN D 25 HYDROXY (VIT D DEFICIENCY, FRACTURES): Vit D, 25-Hydroxy: 106 ng/mL — ABNORMAL HIGH (ref 30–100)

## 2020-11-28 ENCOUNTER — Other Ambulatory Visit: Payer: Self-pay

## 2020-11-28 DIAGNOSIS — I10 Essential (primary) hypertension: Secondary | ICD-10-CM

## 2020-11-28 MED ORDER — LOSARTAN POTASSIUM-HCTZ 50-12.5 MG PO TABS
1.0000 | ORAL_TABLET | Freq: Every day | ORAL | 11 refills | Status: DC
Start: 2020-11-28 — End: 2021-03-09

## 2021-01-26 ENCOUNTER — Encounter: Payer: Self-pay | Admitting: Nurse Practitioner

## 2021-01-26 NOTE — Telephone Encounter (Signed)
Can you work on her prior [place]

## 2021-01-27 ENCOUNTER — Encounter: Payer: Self-pay | Admitting: Adult Health

## 2021-02-01 ENCOUNTER — Telehealth: Payer: Self-pay

## 2021-02-01 ENCOUNTER — Encounter: Payer: Self-pay | Admitting: Nurse Practitioner

## 2021-02-01 NOTE — Telephone Encounter (Signed)
Prior Auth for [employer] approved through 02/01/22

## 2021-02-21 ENCOUNTER — Encounter: Payer: Self-pay | Admitting: Nurse Practitioner

## 2021-02-22 ENCOUNTER — Other Ambulatory Visit: Payer: Self-pay | Admitting: Nurse Practitioner

## 2021-02-22 DIAGNOSIS — M797 Fibromyalgia: Secondary | ICD-10-CM

## 2021-02-22 MED ORDER — GABAPENTIN 300 MG PO CAPS: ORAL_CAPSULE | ORAL | 2 refills | Status: DC

## 2021-02-27 ENCOUNTER — Encounter: Payer: Self-pay | Admitting: Pain Medicine

## 2021-02-27 ENCOUNTER — Ambulatory Visit: Payer: BC Managed Care – PPO | Attending: Pain Medicine | Admitting: Pain Medicine

## 2021-02-27 ENCOUNTER — Other Ambulatory Visit: Payer: Self-pay

## 2021-02-27 ENCOUNTER — Other Ambulatory Visit: Payer: Self-pay | Admitting: Pain Medicine

## 2021-02-27 VITALS — BP 142/95 | Temp 97.4°F | Resp 16 | Ht 67.5 in | Wt 170.0 lb

## 2021-02-27 DIAGNOSIS — M25552 Pain in left hip: Secondary | ICD-10-CM | POA: Diagnosis not present

## 2021-02-27 DIAGNOSIS — F1290 Cannabis use, unspecified, uncomplicated: Secondary | ICD-10-CM | POA: Diagnosis not present

## 2021-02-27 DIAGNOSIS — R892 Abnormal level of other drugs, medicaments and biological substances in specimens from other organs, systems and tissues: Secondary | ICD-10-CM

## 2021-02-27 DIAGNOSIS — R52 Pain, unspecified: Secondary | ICD-10-CM

## 2021-02-27 DIAGNOSIS — M25551 Pain in right hip: Secondary | ICD-10-CM

## 2021-02-27 DIAGNOSIS — G8929 Other chronic pain: Secondary | ICD-10-CM

## 2021-02-27 DIAGNOSIS — G894 Chronic pain syndrome: Secondary | ICD-10-CM | POA: Diagnosis not present

## 2021-02-27 DIAGNOSIS — M160 Bilateral primary osteoarthritis of hip: Secondary | ICD-10-CM | POA: Diagnosis not present

## 2021-02-27 DIAGNOSIS — Z79891 Long term (current) use of opiate analgesic: Secondary | ICD-10-CM

## 2021-02-27 DIAGNOSIS — Z79899 Other long term (current) drug therapy: Secondary | ICD-10-CM

## 2021-02-27 DIAGNOSIS — M25511 Pain in right shoulder: Secondary | ICD-10-CM | POA: Diagnosis not present

## 2021-02-27 DIAGNOSIS — M25512 Pain in left shoulder: Secondary | ICD-10-CM

## 2021-02-27 DIAGNOSIS — M797 Fibromyalgia: Secondary | ICD-10-CM

## 2021-02-27 MED ORDER — TRAMADOL HCL 50 MG PO TABS: ORAL_TABLET | ORAL | 0 refills | Status: DC

## 2021-02-27 NOTE — Patient Instructions (Signed)

## 2021-02-27 NOTE — Progress Notes (Signed)
PROVIDER NOTE: Information contained herein reflects review and annotations entered in association with encounter. Interpretation of such information and data should be left to medically-trained personnel. Information provided to patient can be located elsewhere in the medical record under "Patient Instructions". Document created using STT-dictation technology, any transcriptional errors that may result from process are unintentional.    Patient: Wendy Valencia  Service Category: E/M  Provider: Gaspar Cola, MD  DOB: [birth year]  DOS: 02/27/2021  Specialty: Interventional Pain Management  MRN: [id number]  Setting: Ambulatory outpatient  PCP: Unk Pinto, MD  Type: Established Patient    Referring Provider: Unk Pinto, MD  Location: Office  Delivery: Face-to-face     HPI  Wendy Valencia, a 56 y.o. year old female, is here today because of her Chronic pain syndrome [G89.4]. Wendy Valencia primary complain today is Hip Pain (bilateral) Last encounter: My last encounter with her was on Visit date not found. Pertinent problems: Wendy Valencia has Cervical spondylosis without myelopathy; Fibromyalgia affecting multiple sites (2ry area of Pain); Spondylosis of lumbar spine; Chronic generalized pain (1ry area of Pain); Chronic pain syndrome; Chronic shoulder pain (3ry area of Pain) (Bilateral) (R>L); Chronic hip pain (4th area of Pain) (Bilateral) (R>L); Greater trochanteric bursitis of hips (Bilateral); Osteoarthritis of hips (Bilateral); Lumbar facet syndrome (Right); Spondylosis without myelopathy or radiculopathy, lumbosacral region; Chronic hip pain (Left); and Chronic low back pain (Right) w/o sciatica on their pertinent problem list. Pain Assessment: Severity of   is reported as a 4 /10. Location: Back (radiates to hips bilateral to top of thight, left worse) Lower, Right, Left/hips bilateral. down to thighs. Onset: More than a month ago. Quality: Aching, Discomfort, Dull,  Radiating, Burning. Timing: Intermittent. Modifying factor(s): rest, medications. Vitals:  height is 5' 7.5" (1.715 m) and weight is 170 lb (77.1 kg). Her temperature is 97.4 F (36.3 C) (abnormal). Her blood pressure is 142/95 (abnormal). Her respiration is 16 and oxygen saturation is 100%.   Reason for encounter: medication management.  Unfortunately the patient's 08/25/2020 UDS came back positive for THC.  According to my notes on 08/25/2020, we spent a lot of time talking about the PMP, but I do not have any notes about her using any CBD.  She insists that she told us about it and therefore I am giving her the benefit of the doubt.  I also checked on the nurses note and there were no notes in reference to her having indicated that she was taking that CBD.  In any case, I reminded her that we have been providing her with information regarding CBD going back to 06/03/2017 on the after visit summary (AVS).  Today I did provide her with some additional information regarding the issues associated with the CBD, THC, and the manufacturing processes.  RTCB: 03/16/2021  Pharmacotherapy Assessment  Analgesic: Tramadol 50 mg 2 tablets twice daily (200 mg/day of tramadol) MME/day: 20 mg/day.   Monitoring: [place] PMP: PDMP reviewed during this encounter.       Pharmacotherapy: No side-effects or adverse reactions reported. Compliance: No problems identified. Effectiveness: Clinically acceptable.  Ignatius Specking, RN  02/27/2021  1:32 PM  Sign when Signing Visit Nursing Pain Medication Assessment:  Safety precautions to be maintained throughout the outpatient stay will include: orient to surroundings, keep bed in low position, maintain call bell within reach at all times, provide assistance with transfer out of bed and ambulation.  Medication Inspection Compliance: Pill count conducted under aseptic conditions, in front of the  patient. Neither the pills nor the bottle was removed from the patient's sight at any  time. Once count was completed pills were immediately returned to the patient in their original bottle.  Medication: Tramadol (Ultram) Pill/Patch Count:  14 of 120 pills remain Pill/Patch Appearance: Markings consistent with prescribed medication Bottle Appearance: Standard pharmacy container. Clearly labeled. Filled Date: 01/17 / 2023 Last Medication intake:  Today    UDS:  Summary  Date Value Ref Range Status  08/25/2020 Note  Final    Comment:    ==================================================================== [employer] Select 13 (MW) ==================================================================== Test                             Result       Flag       Units  Drug Present not Declared for Prescription Verification   Carboxy-THC                    289          UNEXPECTED ng/mg creat    Carboxy-THC is a metabolite of tetrahydrocannabinol (THC). Source of    THC is most commonly herbal marijuana or marijuana-based products,    but THC is also present in a scheduled prescription medication.    Trace amounts of THC can be present in hemp and cannabidiol (CBD)    products. This test is not intended to distinguish between delta-9-    tetrahydrocannabinol, the predominant form of THC in most herbal or    marijuana-based products, and delta-8-tetrahydrocannabinol.    Tramadol                       >64680       UNEXPECTED ng/mg creat   O-Desmethyltramadol            >32122       UNEXPECTED ng/mg creat   N-Desmethyltramadol            8764         UNEXPECTED ng/mg creat    Source of tramadol is a prescription medication. O-desmethyltramadol    and N-desmethyltramadol are expected metabolites of tramadol.  ==================================================================== Test                      Result    Flag   Units      Ref Range   Creatinine              44               mg/dL      >=20 ==================================================================== Declared  Medications:  The flagging and interpretation on this report are based on the  following declared medications.  Unexpected results may arise from  inaccuracies in the declared medications.   **Note: The testing scope of this panel does not include the  following reported medications:   Acetaminophen  Albuterol  Aspirin  Caffeine  Cetirizine (Zyrtec-D)  Chondroitin  Diphenhydramine (Benadryl)  Duloxetine (Cymbalta)  Gabapentin (Neurontin)  Glucosamine  Hydrochlorothiazide  Insulin  Iron  Lisinopril  Multivitamin  Plecanatide (Trulance)  Pseudoephedrine (Zyrtec-D)  Quetiapine (Seroquel)  Semaglutide (Wegovy)  Sennosides (Senna)  Supplement  Undefined Miscellaneous Drug  Valacyclovir (Valtrex)  Vitamin D2 (Drisdol) ==================================================================== For clinical consultation, please call [phone]. ====================================================================      ROS  Constitutional: Denies any fever or chills Gastrointestinal: No reported hemesis, hematochezia, vomiting, or acute GI distress Musculoskeletal: Denies  any acute onset joint swelling, redness, loss of ROM, or weakness Neurological: No reported episodes of acute onset apraxia, aphasia, dysarthria, agnosia, amnesia, paralysis, loss of coordination, or loss of consciousness  Medication Review  Albuterol Sulfate, Aspirin-Acetaminophen-Caffeine, Cannabinoids, DULoxetine, Fusion Plus, Glucosamine-Chondroitin, Insulin Pen Needle, OVER THE COUNTER MEDICATION, QUEtiapine, Semaglutide-Weight Management, Senna, Vitamin D (Ergocalciferol), betamethasone dipropionate, cetirizine-pseudoephedrine, diphenhydrAMINE, ferrous sulfate, gabapentin, losartan-hydrochlorothiazide, multivitamin with minerals, traMADol, and valACYclovir  History Review  Allergy: Wendy Valencia has No Known Allergies. Drug: Wendy Valencia  reports no history of drug use. Alcohol:  reports current  alcohol use of about 1.0 standard drink per week. Tobacco:  reports that she has been smoking cigarettes. She started smoking about 32 years ago. She has a 4.65 pack-year smoking history. She has never used smokeless tobacco. Social: Wendy Valencia  reports that she has been smoking cigarettes. She started smoking about 32 years ago. She has a 4.65 pack-year smoking history. She has never used smokeless tobacco. She reports current alcohol use of about 1.0 standard drink per week. She reports that she does not use drugs. Medical:  has a past medical history of Allergy, Anemia, Anxiety, Arthritis, Fibromyalgia, GERD (gastroesophageal reflux disease), Hypertension, IBS (irritable bowel syndrome), Nephrolithiasis, and Plantar fasciitis of right foot. Surgical: Wendy Valencia  has a past surgical history that includes Left shoulder arthroscopic surgery (Left); tummy tuck (2005); Breath tek h pylori (07/11/2011); Gastric Roux-En-Y (10/01/2011); Cholecystectomy (10/01/2011); and Colonoscopy. Family: family history includes Breast cancer (age of onset: 53) in her mother; Breast cancer (age of onset: 37) in her maternal aunt; Cancer in her maternal grandmother; Cancer (age of onset: 43) in her mother; Heart attack in her maternal grandfather and paternal grandfather; Heart disease in her father and maternal grandfather; Hypertension in her father; Lung cancer in her maternal grandmother; Multiple sclerosis in her maternal uncle; Obesity in her brother; Stroke in her father.  Laboratory Chemistry Profile   Renal Lab Results  Component Value Date   BUN 14 11/08/2020   CREATININE 0.71 11/08/2020   LABCREA 105 98/26/4158   BCR NOT APPLICABLE 30/94/0768   GFRAA 113 12/14/2019   GFRNONAA 97 12/14/2019    Hepatic Lab Results  Component Value Date   AST 15 11/08/2020   ALT 11 11/08/2020   ALBUMIN 4.5 03/05/2016   ALKPHOS 123 (H) 03/05/2016    Electrolytes Lab Results  Component Value Date   NA 141  11/08/2020   K 3.7 11/08/2020   CL 104 11/08/2020   CALCIUM 9.8 11/08/2020   MG 2.3 11/08/2020    Bone Lab Results  Component Value Date   VD25OH 106 (H) 11/08/2020    Inflammation (CRP: Acute Phase) (ESR: Chronic Phase) Lab Results  Component Value Date   CRP 0.9 03/04/2017   ESRSEDRATE 22 (H) 03/04/2017         Note: Above Lab results reviewed.  Recent Imaging Review  MM 3D SCREEN BREAST BILATERAL CLINICAL DATA:  Screening.  EXAM: DIGITAL SCREENING BILATERAL MAMMOGRAM WITH TOMOSYNTHESIS AND CAD  TECHNIQUE: Bilateral screening digital craniocaudal and mediolateral oblique mammograms were obtained. Bilateral screening digital breast tomosynthesis was performed. The images were evaluated with computer-aided detection.  COMPARISON:  Previous exam(s).  ACR Breast Density Category a: The breast tissue is almost entirely fatty.  FINDINGS: There are no findings suspicious for malignancy.  IMPRESSION: No mammographic evidence of malignancy. A result letter of this screening mammogram will be mailed directly to the patient.  RECOMMENDATION: Screening mammogram in one year. (Code:[id number]-B-01Y)  BI-RADS CATEGORY  1: Negative.  Electronically Signed   By: Evangeline Dakin M.D.   On: 03/09/2020 15:29 Note: Reviewed        Physical Exam  General appearance: Well nourished, well developed, and well hydrated. In no apparent acute distress Mental status: Alert, oriented x 3 (person, place, & time)       Respiratory: No evidence of acute respiratory distress Eyes: PERLA Vitals: BP (!) 142/95    Temp (!) 97.4 F (36.3 C)    Resp 16    Ht 5' 7.5" (1.715 m)    Wt 170 lb (77.1 kg)    SpO2 100%    BMI 26.23 kg/m  BMI: Estimated body mass index is 26.23 kg/m as calculated from the following:   Height as of this encounter: 5' 7.5" (1.715 m).   Weight as of this encounter: 170 lb (77.1 kg). Ideal: Ideal body weight: 62.7 kg (138 lb 5.4 oz) Adjusted ideal body weight: 68.5 kg  (151 lb)  Assessment   Status Diagnosis  Controlled Controlled Controlled 1. Chronic pain syndrome   2. Chronic generalized pain (1ry area of Pain)   3. Fibromyalgia affecting multiple sites (2ry area of Pain)   4. Chronic shoulder pain (3ry area of Pain) (Bilateral) (R>L)   5. Chronic hip pain (4th area of Pain) (Bilateral) (R>L)   6. Osteoarthritis of hips (Bilateral)   7. Pharmacologic therapy   8. Chronic use of opiate for therapeutic purpose   9. Encounter for medication management   10. Abnormal drug screen (08/25/2020)   11. Marijuana use      Updated Problems: Problem  Abnormal drug screen (08/25/2020)   Abnormal UDS (08/25/2020) positive for THC.   Marijuana Use  Iron Deficiency  Overweight (Bmi 25.0-29.9)    Plan of Care  Problem-specific:  No problem-specific Assessment & Plan notes found for this encounter.  Wendy Valencia has a current medication list which includes the following long-term medication(s): duloxetine, duloxetine, gabapentin, losartan-hydrochlorothiazide, quetiapine, and [START ON 03/01/2021] tramadol.  Pharmacotherapy (Medications Ordered): Meds ordered this encounter  Medications   traMADol (ULTRAM) 50 MG tablet    Sig: Take 1 tablet (50 mg total) by mouth in the morning, at noon, in the evening, and at bedtime for 7 days, THEN 1 tablet (50 mg total) 3 (three) times daily for 7 days, THEN 1 tablet (50 mg total) 2 (two) times daily for 7 days, THEN 1 tablet (50 mg total) daily for 7 days. Each refill must last 30 days..    Dispense:  70 tablet    Refill:  0    DO NOT: delete (not duplicate); no partial-fill (will deny script to complete), no refill request (F/U required). DISPENSE: 1 day early if closed on fill date. WARN: No CNS-depressants within 8 hrs of med.   Orders:  Orders Placed This Encounter  Procedures   [employer]ox[employer] Select 13 (MW), Urine    Volume: 30 ml(s). Minimum 3 ml of urine is needed. Document temperature of fresh  sample. Indications: Long term (current) use of opiate analgesic [phone])    Order Specific Question:   Release to patient    Answer:   Immediate   Delta-8 / Delta-9 THC mtb,MS,U    Minimum Volume: 3 mL Submission/Transport: (<3 days) [facility] Test: [id number] CPT: [phone]    Order Specific Question:   Release to patient    Answer:   Immediate   Cannabidiol (CBD)/Tetrahydrocannabinol (THC) Ratio, Urine    [facility] Test: [id number]  Order Specific Question:   Release to patient    Answer:   Immediate   Follow-up plan:   Return in about 2 weeks (around 03/13/2021) for Eval-day (M,W), (VV), (MM), for review of ordered tests.     Interventional options: Considering:   Diagnostic bilateral intra-articular shoulder joint injection  Diagnostic bilateral suprascapular nerve block  Possible bilateral suprascapular nerve RFA  Diagnostic bilateral intra-articular hip joint injection  Diagnostic/therapeutic bilateral trochanteric bursa injection  Diagnostic bilateral femoral nerve + obturator nerve block  Possible bilateral femoral nerve + obturator nerve RFA  Diagnostic/therapeutic Lidocaine infusion  Diagnostic/therapeutic Trigger point injection  Diagnostic bilateral cervical facet block  Diagnostic bilateral cervical facet RFA    Palliative PRN treatment(s):   Diagnostic bilateral intra-articular hip joint injection #2 + bilateral trochanteric bursa injection #2, under fluoroscopic guidance and IV sedation    Recent Visits No visits were found meeting these conditions. Showing recent visits within past 90 days and meeting all other requirements Today's Visits Date Type Provider Dept  02/27/21 Office Visit Milinda Pointer, MD [facility]  Showing today's visits and meeting all other requirements Future Appointments Date Type Provider Dept  03/08/21 Appointment Milinda Pointer, MD [facility]  Showing future appointments within next 90 days and meeting  all other requirements  I discussed the assessment and treatment plan with the patient. The patient was provided an opportunity to ask questions and all were answered. The patient agreed with the plan and demonstrated an understanding of the instructions.  Patient advised to call back or seek an in-person evaluation if the symptoms or condition worsens.  Duration of encounter: 30 minutes.  Note by: Gaspar Cola, MD Date: 02/27/2021; Time: 2:19 PM

## 2021-02-27 NOTE — Progress Notes (Signed)
Nursing Pain Medication Assessment:  Safety precautions to be maintained throughout the outpatient stay will include: orient to surroundings, keep bed in low position, maintain call bell within reach at all times, provide assistance with transfer out of bed and ambulation.  Medication Inspection Compliance: Pill count conducted under aseptic conditions, in front of the patient. Neither the pills nor the bottle was removed from the patient's sight at any time. Once count was completed pills were immediately returned to the patient in their original bottle.  Medication: Tramadol (Ultram) Pill/Patch Count:  14 of 120 pills remain Pill/Patch Appearance: Markings consistent with prescribed medication Bottle Appearance: Standard pharmacy container. Clearly labeled. Filled Date: 01/17 / 2023 Last Medication intake:  Today

## 2021-03-03 LAB — TOXASSURE SELECT 13 (MW), URINE

## 2021-03-04 LAB — DELTA-8 / DELTA-9 THC MTB,MS,U
Carboxy-Delta-8-THC: NOT DETECTED ng/mL
Carboxy-Delta-9-THC: NOT DETECTED ng/mL

## 2021-03-07 NOTE — Progress Notes (Signed)
Patient: Wendy Valencia  Service Category: E/M  Provider: Gaspar Cola, MD  DOB: 01-17-1965  DOS: 03/08/2021  Location: Office  MRN: [id number]  Setting: Ambulatory outpatient  Referring Provider: Unk Pinto, MD  Type: Established Patient  Specialty: Interventional Pain Management  PCP: Unk Pinto, MD  Location: Remote location  Delivery: TeleHealth     Virtual Encounter - Pain Management PROVIDER NOTE: Information contained herein reflects review and annotations entered in association with encounter. Interpretation of such information and data should be left to medically-trained personnel. Information provided to patient can be located elsewhere in the medical record under "Patient Instructions". Document created using STT-dictation technology, any transcriptional errors that may result from process are unintentional.    Contact & Pharmacy Preferred: [phone] Home: [phone] (home) Mobile: [phone] (mobile) E-mail: [email or web address]  [facility] [id number], [place] [address] Phone: [phone] Fax: [phone]   Pre-screening  Wendy Valencia offered "in-person" vs "virtual" encounter. She indicated preferring virtual for this encounter.   Reason COVID-19*   Social distancing based on CDC and AMA recommendations.   I contacted Wendy Valencia on 03/08/2021 via telephone.      I clearly identified myself as Gaspar Cola, MD. I verified that I was speaking with the correct person using two identifiers (Name: Wendy Valencia, and date of birth: 1965/02/13).  Consent I sought verbal advanced consent from Wendy Valencia for virtual visit interactions. I informed Wendy Valencia of possible security and privacy concerns, risks, and limitations associated with providing "not-in-person" medical evaluation and management services. I also informed Wendy Valencia of the  availability of "in-person" appointments. Finally, I informed her that there would be a charge for the virtual visit and that she could be  personally, fully or partially, financially responsible for it. Wendy Valencia expressed understanding and agreed to proceed.   Historic Elements   Wendy Valencia is a 56 y.o. year old, female patient evaluated today after our last contact on 02/27/2021. Wendy Valencia  has a past medical history of Allergy, Anemia, Anxiety, Arthritis, Fibromyalgia, GERD (gastroesophageal reflux disease), Hypertension, IBS (irritable bowel syndrome), Nephrolithiasis, and Plantar fasciitis of right foot. She also  has a past surgical history that includes Left shoulder arthroscopic surgery (Left); tummy tuck (2005); Breath tek h pylori (07/11/2011); Gastric Roux-En-Y (10/01/2011); Cholecystectomy (10/01/2011); and Colonoscopy. Wendy Valencia has a current medication Valencia which includes the following prescription(s): albuterol sulfate, aspirin-acetaminophen-caffeine, betamethasone dipropionate, cannabinoids, cetirizine-pseudoephedrine, diphenhydramine, duloxetine, duloxetine, ferrous sulfate, gabapentin, glucosamine-chondroitin, fusion plus, losartan-hydrochlorothiazide, multivitamin with minerals, novofine, OVER THE COUNTER MEDICATION, quetiapine, senna, tramadol, valacyclovir, vitamin d (ergocalciferol), and wegovy. She  reports that she has been smoking cigarettes. She started smoking about 32 years ago. She has a 4.65 pack-year smoking history. She has never used smokeless tobacco. She reports current alcohol use of about 1.0 standard drink per week. She reports that she does not use drugs. Wendy Valencia has No Known Allergies.   HPI  Today, she is being contacted for medication management.  Patient last seen on 02/27/2021 about an abnormal drug screen on 08/25/2020.  The patient claims that the [facility] found on the test was secondary to the use of CBD.  Patient retested on  02/27/2021.  Patient indicated that since she learned that she cannot use the CBD she has no longer taking the medication.  She was reminded that we have been providing her with information regarding the CBD dating back to  06/03/2017. (06/03/2017; 11/18/2019; 02/17/2020; 06/01/2020).   The patient indicates doing well with the current medication regimen. No adverse reactions or side effects reported to the medications.  Today I have reviewed the results of her last UDS as well asked the [facility] test which came back negative for the delta 8 and delta 9.  The patient is now well aware of the issues surrounding the CBD and therefore this should not happen again.  Pharmacotherapy Assessment   Opioid Analgesic: Tramadol 50 mg 2 tablets twice daily (200 mg/day of tramadol) MME/day: 20 mg/day.   Monitoring:  PMP: PDMP reviewed during this encounter.       Pharmacotherapy: No side-effects or adverse reactions reported. Compliance: No problems identified. Effectiveness: Clinically acceptable. Plan: Refer to "POC". UDS:  Summary  Date Value Ref Range Status  02/27/2021 Note  Final    Comment:    ==================================================================== [employer] Select 13 (MW) ==================================================================== Test                             Result       Flag       Units  Drug Present and Declared for Prescription Verification   Tramadol                       >6024        EXPECTED   ng/mg creat   O-Desmethyltramadol            >6024        EXPECTED   ng/mg creat   N-Desmethyltramadol            >6024        EXPECTED   ng/mg creat    Source of tramadol is a prescription medication. O-desmethyltramadol    and N-desmethyltramadol are expected metabolites of tramadol.  ==================================================================== Test                      Result    Flag   Units      Ref Range   Creatinine              83               mg/dL       >=20 ==================================================================== Declared Medications:  The flagging and interpretation on this report are based on the  following declared medications.  Unexpected results may arise from  inaccuracies in the declared medications.   **Note: The testing scope of this panel includes these medications:   Tramadol (Ultram)   **Note: The testing scope of this panel does not include the  following reported medications:   Acetaminophen  Albuterol  Aspirin  Betamethasone  Caffeine  Cetirizine (Zyrtec-D)  Chondroitin  Diphenhydramine (Benadryl)  Duloxetine (Cymbalta)  Gabapentin (Neurontin)  Glucosamine  Hydrochlorothiazide (Hyzaar)  Iron  Losartan (Hyzaar)  Multivitamin  Probiotic  Pseudoephedrine (Zyrtec-D)  Quetiapine (Seroquel)  Semaglutide (Wegovy)  Sennosides (Senna)  Valacyclovir (Valtrex)  Vitamin B  Vitamin C  Vitamin D2 (Drisdol) ==================================================================== For clinical consultation, please call [phone]. ====================================================================      Laboratory Chemistry Profile   Renal Lab Results  Component Value Date   BUN 14 11/08/2020   CREATININE 0.71 11/08/2020   LABCREA 105 11/57/2620   BCR NOT APPLICABLE 35/59/7416   GFRAA 113 12/14/2019   GFRNONAA 97 12/14/2019    Hepatic Lab Results  Component Value Date   AST 15 11/08/2020  ALT 11 11/08/2020   ALBUMIN 4.5 03/05/2016   ALKPHOS 123 (H) 03/05/2016    Electrolytes Lab Results  Component Value Date   NA 141 11/08/2020   K 3.7 11/08/2020   CL 104 11/08/2020   CALCIUM 9.8 11/08/2020   MG 2.3 11/08/2020    Bone Lab Results  Component Value Date   VD25OH 106 (H) 11/08/2020    Inflammation (CRP: Acute Phase) (ESR: Chronic Phase) Lab Results  Component Value Date   CRP 0.9 03/04/2017   ESRSEDRATE 22 (H) 03/04/2017         Note: Above Lab results reviewed.  Imaging   MM 3D SCREEN BREAST BILATERAL CLINICAL DATA:  Screening.  EXAM: DIGITAL SCREENING BILATERAL MAMMOGRAM WITH TOMOSYNTHESIS AND CAD  TECHNIQUE: Bilateral screening digital craniocaudal and mediolateral oblique mammograms were obtained. Bilateral screening digital breast tomosynthesis was performed. The images were evaluated with computer-aided detection.  COMPARISON:  Previous exam(s).  ACR Breast Density Category a: The breast tissue is almost entirely fatty.  FINDINGS: There are no findings suspicious for malignancy.  IMPRESSION: No mammographic evidence of malignancy. A result letter of this screening mammogram will be mailed directly to the patient.  RECOMMENDATION: Screening mammogram in one year. (Code:[id number])  BI-RADS CATEGORY  1: Negative.  Electronically Signed   By: Evangeline Dakin M.D.   On: 03/09/2020 15:29  Assessment  The primary encounter diagnosis was Chronic pain syndrome. Diagnoses of Chronic generalized pain (1ry area of Pain), Fibromyalgia affecting multiple sites (2ry area of Pain), Chronic shoulder pain (3ry area of Pain) (Bilateral) (R>L), Chronic hip pain (4th area of Pain) (Bilateral) (R>L), Osteoarthritis of hips (Bilateral), Pharmacologic therapy, Chronic use of opiate for therapeutic purpose, and Encounter for medication management were also pertinent to this visit.  Plan of Care  Problem-specific:  No problem-specific Assessment & Plan notes found for this encounter.  Ms. Von Inscoe Hellstrom has a current medication Valencia which includes the following long-term medication(s): duloxetine, duloxetine, gabapentin, losartan-hydrochlorothiazide, quetiapine, and tramadol.  Pharmacotherapy (Medications Ordered): Meds ordered this encounter  Medications   traMADol (ULTRAM) 50 MG tablet    Sig: Take 1 tablet (50 mg total) by mouth every 6 (six) hours as needed for severe pain. Each refill must last 30 days.    Dispense:  120 tablet    Refill:  5     DO NOT: delete (not duplicate); no partial-fill (will deny script to complete), no refill request (F/U required). DISPENSE: 1 day early if closed on fill date. WARN: No CNS-depressants within 8 hrs of med.   Orders:  No orders of the defined types were placed in this encounter.  Follow-up plan:   Return in about 6 months (around 09/04/2021) for Eval-day (M,W), (F2F), (MM).     Interventional Therapies  Risk   Complexity Considerations:   Estimated body mass index is 26.23 kg/m as calculated from the following:   Height as of 02/27/21: 5' 7.5" (1.715 m).   Weight as of 02/27/21: 170 lb (77.1 kg). WNL   Planned   Pending:      Under consideration:      Completed:   Diagnostic bilateral IA hip joint inj. x1 (06/25/2017) (100/100/70/75-100)  Diagnostic bilateral superficial trochanteric bursa inj. x1 (06/25/2017) (100/100/70/75-100)    Therapeutic   Palliative (PRN) options:   Therapeutic bilateral hip & TB inj. #2     Recent Visits Date Type Provider Dept  02/27/21 Office Visit Milinda Pointer, MD [facility]  Showing recent visits within past 33  days and meeting all other requirements Today's Visits Date Type Provider Dept  03/08/21 Office Visit Milinda Pointer, MD [facility]  Showing today's visits and meeting all other requirements Future Appointments No visits were found meeting these conditions. Showing future appointments within next 90 days and meeting all other requirements  I discussed the assessment and treatment plan with the patient. The patient was provided an opportunity to ask questions and all were answered. The patient agreed with the plan and demonstrated an understanding of the instructions.  Patient advised to call back or seek an in-person evaluation if the symptoms or condition worsens.  Duration of encounter: 12 minutes.  Note by: Gaspar Cola, MD Date: 03/08/2021; Time: 8:17 AM

## 2021-03-08 ENCOUNTER — Other Ambulatory Visit: Payer: Self-pay

## 2021-03-08 ENCOUNTER — Ambulatory Visit: Payer: BC Managed Care – PPO | Attending: Pain Medicine | Admitting: Pain Medicine

## 2021-03-08 DIAGNOSIS — Z79899 Other long term (current) drug therapy: Secondary | ICD-10-CM

## 2021-03-08 DIAGNOSIS — M797 Fibromyalgia: Secondary | ICD-10-CM | POA: Diagnosis not present

## 2021-03-08 DIAGNOSIS — R52 Pain, unspecified: Secondary | ICD-10-CM

## 2021-03-08 DIAGNOSIS — Z79891 Long term (current) use of opiate analgesic: Secondary | ICD-10-CM

## 2021-03-08 DIAGNOSIS — G894 Chronic pain syndrome: Secondary | ICD-10-CM | POA: Diagnosis not present

## 2021-03-08 DIAGNOSIS — M160 Bilateral primary osteoarthritis of hip: Secondary | ICD-10-CM

## 2021-03-08 DIAGNOSIS — M25551 Pain in right hip: Secondary | ICD-10-CM

## 2021-03-08 DIAGNOSIS — M25512 Pain in left shoulder: Secondary | ICD-10-CM

## 2021-03-08 DIAGNOSIS — M25511 Pain in right shoulder: Secondary | ICD-10-CM | POA: Diagnosis not present

## 2021-03-08 DIAGNOSIS — G8929 Other chronic pain: Secondary | ICD-10-CM

## 2021-03-08 DIAGNOSIS — M25552 Pain in left hip: Secondary | ICD-10-CM

## 2021-03-08 LAB — CANNABIDIOL (CBD)/TETRAHYDROCANNABINOL (THC) RATIO, URINE: CBD:THC Ratio: NEGATIVE RATIO

## 2021-03-08 MED ORDER — TRAMADOL HCL 50 MG PO TABS: 50.0000 mg | ORAL_TABLET | Freq: Four times a day (QID) | ORAL | 5 refills | Status: DC | PRN

## 2021-03-08 NOTE — Progress Notes (Signed)
Assessment and Plan:  Wendy Valencia was seen today for follow-up.  Diagnoses and all orders for this visit:  Essential hypertension - continue medications, DASH diet, exercise and monitor at home. Call if greater than 130/80.     - Increase losartan-hydrochlorothiazide (HYZAAR) to 100/25mg  daily; Take 1 tablet by mouth daily. -     CBC with Differential/Platelet  Hyperlipidemia Continue diet and exercise - Check lipid and CMP - Check TSH  Fibromyalgia Continue to follow with pain clinic for Tramadol Refilled Seroquel 25 mg QHS, Gabapentin 600 mg TID as needed and Duloxetine 60,mg and 30mg  daily Continue to monitor symptoms  Overweight (BMI 25.0-29.9)  Continue Wegovy, diet and exercise  Vitamin D deficiency  Check VitD, continue Vit D supplementation  S/P gastric bypass -     COMPLETE METABOLIC PANEL WITH GFR  Stomach pain Follow up with Dr Russella Dar for evaluation due to history of Gastric Bypass  Cold sores Continue Valtrex as needed  Drug Induced Constipation Encouraged to increase water consumption Lactulose 20g bid as needed Increase fiber intake   Further disposition pending results of labs. Discussed med's effects and SE's.   Over 30 minutes of exam, counseling, chart review, and critical decision making was performed.   Future Appointments  Date Time Provider Department Center  08/23/2021  3:00 PM Delano Metz, MD [facility] None  09/06/2021 10:00 AM Judd Gaudier, NP [facility] None    ------------------------------------------------------------------------------------------------------------------   HPI BP (!) 150/88    Pulse 73    Temp (!) 97.5 F (36.4 C)    Wt 170 lb 12.8 oz (77.5 kg)    SpO2 95%    BMI 26.36 kg/m  56 y.o.female presents for 3 month recheck on fibromyalgia, hypertension  She is following with pain management.  She is unable to increase tramadol for pain control and they recommended increasing Gabapentin.  She is having a lot of  constipation related to pain medication, gastric bypass and Wegovy.   She is also have a lot of stomach pain after she eats She has used TUMS with some relief. She is having a lot of gas.  She had her blood pressure medication zestoretic increased 8 weeks ago. Currently taking Losartan 50/12.5 mg 2 tabs daily. She is still getting headaches. Bp is running 140-150/80-90 BP Readings from Last 3 Encounters:  03/09/21 (!) 150/88  02/27/21 (!) 142/95  11/08/20 140/82     BMI is Body mass index is 26.36 kg/m., she has been working on diet and exercise. She is on Wegovy 2.4 mg SQ QW . She is s/p gastric bypass 2014. She is doing yoga and trying kickboxing.  Wt Readings from Last 3 Encounters:  03/09/21 170 lb 12.8 oz (77.5 kg)  02/27/21 170 lb (77.1 kg)  11/08/20 174 lb 6.4 oz (79.1 kg)   She is not on cholesterol medication and cholesterol is currently controlled with diet and exercise  Lab Results  Component Value Date   CHOL 163 09/02/2020   HDL 72 09/02/2020   LDLCALC 73 09/02/2020   TRIG 92 09/02/2020   CHOLHDL 2.3 09/02/2020     Past Medical History:  Diagnosis Date   Allergy    Anemia    Anxiety    Arthritis    Fibromyalgia    GERD (gastroesophageal reflux disease)    Hypertension    IBS (irritable bowel syndrome)    Nephrolithiasis    Plantar fasciitis of right foot    wears boot      No Known Allergies  Current Outpatient Medications on File Prior to Visit  Medication Sig   Albuterol Sulfate (PROVENTIL HFA IN) Inhale 108 mcg into the lungs as needed.   Aspirin-Acetaminophen-Caffeine (EXCEDRIN EXTRA STRENGTH PO) Take 1 tablet by mouth 3 (three) times daily as needed (headache).    betamethasone dipropionate (DIPROLENE) 0.05 % ointment Apply topically 2 (two) times daily.   cetirizine-pseudoephedrine (ZYRTEC-D) 5-120 MG tablet Take 1 tablet by mouth every 12 (twelve) hours as needed for allergies.    diphenhydrAMINE (BENADRYL) 25 MG tablet Take 25-50 mg by mouth 3  (three) times daily as needed for allergies.   DULoxetine (CYMBALTA) 30 MG capsule TAKE 1 CAPSULE DAILY WITH 60 MG TABLET TO EQUAL 90 MG DAILY   DULoxetine (CYMBALTA) 60 MG capsule TAKE 1 CAPSULE DAILY WITH 30 MG TABLET TO EQUAL 90 MG   ferrous sulfate 325 (65 FE) MG EC tablet Take 325 mg by mouth every other day. Taking twice a week   gabapentin (NEURONTIN) 300 MG capsule Take     1 to 2 capsules     3 x /day as      Needed for Chronic Pain   Glucosamine-Chondroitin (COSAMIN DS PO) Take 1,500 mg by mouth daily.   Iron-FA-B Cmp-C-Biot-Probiotic (FUSION PLUS) CAPS Take 1 cap every 2-3 days as tolerated for chronic iron deficiency.   Multiple Vitamins-Minerals (MULTIVITAMIN WITH MINERALS) tablet Take 1 tablet by mouth daily. .   QUEtiapine (SEROQUEL) 25 MG tablet Take three tablets by mouth every night at bedtime   Senna 30 MG MISC Take 30 mg by mouth daily. 4 tablets once per day   traMADol (ULTRAM) 50 MG tablet Take 1 tablet (50 mg total) by mouth every 6 (six) hours as needed for severe pain. Each refill must last 30 days.   valACYclovir (VALTREX) 500 MG tablet TAKE 1 TABLET BY MOUTH DAILY AS NEEDED FOR COLD SORES   Vitamin D, Ergocalciferol, (DRISDOL) 1.25 MG (50000 UNIT) CAPS capsule TAKE 1 CAPSULE TWO TIMES A WEEK   WEGOVY 2.4 MG/0.75ML SOAJ INJECT 2.4 MG UNDER THE SKIN ONCE A WEEK   [DISCONTINUED] losartan-hydrochlorothiazide (HYZAAR) 50-12.5 MG tablet Take 1 tablet by mouth daily.   NOVOFINE 32G X 6 MM MISC Needs daily for victoza   OVER THE COUNTER MEDICATION 540 mg.   No current facility-administered medications on file prior to visit.    Review of Systems  Constitutional:  Negative for chills, fever and weight loss.  HENT:  Positive for tinnitus. Negative for congestion and hearing loss.   Eyes:  Negative for blurred vision and double vision.  Respiratory:  Negative for cough and shortness of breath.   Cardiovascular:  Negative for chest pain, palpitations, orthopnea and leg  swelling.  Gastrointestinal:  Positive for constipation and heartburn. Negative for abdominal pain, diarrhea, nausea and vomiting.        pain in stomach after eating  Genitourinary:  Negative for dysuria.  Musculoskeletal:  Positive for back pain, joint pain and myalgias. Negative for falls.  Skin:  Negative for rash.  Neurological:  Negative for dizziness, tingling, tremors, loss of consciousness and headaches.  Psychiatric/Behavioral:  Negative for depression, memory loss and suicidal ideas. The patient is nervous/anxious (worse when tried to wean off Cymbalta).   .   Physical Exam:  BP (!) 150/88    Pulse 73    Temp (!) 97.5 F (36.4 C)    Wt 170 lb 12.8 oz (77.5 kg)    SpO2 95%    BMI 26.36 kg/m  General Appearance: Well nourished, in no apparent distress. Eyes: PERRLA, EOMs, conjunctiva no swelling or erythema Sinuses: No Frontal/maxillary tenderness ENT/Mouth: Ext aud canals clear, TMs without erythema, bulging. No erythema, swelling, or exudate on post pharynx.  Tonsils not swollen or erythematous. Hearing normal.  Neck: Supple, thyroid normal.  Respiratory: Respiratory effort normal, BS equal bilaterally without rales, rhonchi, wheezing or stridor.  Cardio: RRR with no MRGs. Brisk peripheral pulses without edema.  Abdomen: Soft, + BS.  Non tender, no guarding, rebound, hernias, masses. Lymphatics: Non tender without lymphadenopathy.  Musculoskeletal: Full ROM, 5/5 strength, normal gait.  Skin: Warm, dry without rashes, lesions, ecchymosis.  Neuro: Cranial nerves intact. Normal muscle tone, no cerebellar symptoms. Sensation intact.  Psych: Awake and oriented X 3, normal affect, Insight and Judgment appropriate.     Revonda Humphrey, NP 4:18 PM [facility]

## 2021-03-08 NOTE — Patient Instructions (Signed)

## 2021-03-09 ENCOUNTER — Encounter: Payer: Self-pay | Admitting: Nurse Practitioner

## 2021-03-09 ENCOUNTER — Ambulatory Visit (INDEPENDENT_AMBULATORY_CARE_PROVIDER_SITE_OTHER): Payer: BC Managed Care – PPO | Admitting: Nurse Practitioner

## 2021-03-09 VITALS — BP 150/88 | HR 73 | Temp 97.5°F | Wt 170.8 lb

## 2021-03-09 DIAGNOSIS — I10 Essential (primary) hypertension: Secondary | ICD-10-CM

## 2021-03-09 DIAGNOSIS — Z9884 Bariatric surgery status: Secondary | ICD-10-CM | POA: Diagnosis not present

## 2021-03-09 DIAGNOSIS — E663 Overweight: Secondary | ICD-10-CM | POA: Diagnosis not present

## 2021-03-09 DIAGNOSIS — E559 Vitamin D deficiency, unspecified: Secondary | ICD-10-CM | POA: Diagnosis not present

## 2021-03-09 DIAGNOSIS — R109 Unspecified abdominal pain: Secondary | ICD-10-CM

## 2021-03-09 DIAGNOSIS — K5903 Drug induced constipation: Secondary | ICD-10-CM

## 2021-03-09 DIAGNOSIS — Z79899 Other long term (current) drug therapy: Secondary | ICD-10-CM

## 2021-03-09 DIAGNOSIS — M797 Fibromyalgia: Secondary | ICD-10-CM

## 2021-03-09 DIAGNOSIS — E785 Hyperlipidemia, unspecified: Secondary | ICD-10-CM | POA: Diagnosis not present

## 2021-03-09 DIAGNOSIS — B001 Herpesviral vesicular dermatitis: Secondary | ICD-10-CM

## 2021-03-09 DIAGNOSIS — R7309 Other abnormal glucose: Secondary | ICD-10-CM

## 2021-03-09 LAB — CBC WITH DIFFERENTIAL/PLATELET
Absolute Monocytes: 410 cells/uL (ref 200–950)
Basophils Absolute: 18 cells/uL (ref 0–200)
Basophils Relative: 0.4 %
Eosinophils Absolute: 59 cells/uL (ref 15–500)
Eosinophils Relative: 1.3 %
HCT: 38 % (ref 35.0–45.0)
Hemoglobin: 12.6 g/dL (ref 11.7–15.5)
Lymphs Abs: 1454 cells/uL (ref 850–3900)
MCH: 29.4 pg (ref 27.0–33.0)
MCHC: 33.2 g/dL (ref 32.0–36.0)
MCV: 88.6 fL (ref 80.0–100.0)
MPV: 10 fL (ref 7.5–12.5)
Monocytes Relative: 9.1 %
Neutro Abs: 2561 cells/uL (ref 1500–7800)
Neutrophils Relative %: 56.9 %
Platelets: 327 10*3/uL (ref 140–400)
RBC: 4.29 10*6/uL (ref 3.80–5.10)
RDW: 13.5 % (ref 11.0–15.0)
Total Lymphocyte: 32.3 %
WBC: 4.5 10*3/uL (ref 3.8–10.8)

## 2021-03-09 LAB — LIPID PANEL
Cholesterol: 160 mg/dL (ref <200)
HDL: 69 mg/dL (ref >=50)
LDL Cholesterol (Calc): 75 mg/dL (calc)
Non-HDL Cholesterol (Calc): 91 mg/dL (calc) (ref <130)
Total CHOL/HDL Ratio: 2.3 (calc) (ref <5.0)
Triglycerides: 75 mg/dL (ref <150)

## 2021-03-09 LAB — COMPLETE METABOLIC PANEL WITH GFR
AG Ratio: 1.8 (calc) (ref 1.0–2.5)
ALT: 10 U/L (ref 6–29)
AST: 12 U/L (ref 10–35)
Albumin: 4.3 g/dL (ref 3.6–5.1)
Alkaline phosphatase (APISO): 135 U/L (ref 37–153)
BUN: 14 mg/dL (ref 7–25)
CO2: 29 mmol/L (ref 20–32)
Calcium: 9.6 mg/dL (ref 8.6–10.4)
Chloride: 106 mmol/L (ref 98–110)
Creat: 0.78 mg/dL (ref 0.50–1.03)
Globulin: 2.4 g/dL (calc) (ref 1.9–3.7)
Glucose, Bld: 85 mg/dL (ref 65–99)
Potassium: 4.1 mmol/L (ref 3.5–5.3)
Sodium: 143 mmol/L (ref 135–146)
Total Bilirubin: 0.4 mg/dL (ref 0.2–1.2)
Total Protein: 6.7 g/dL (ref 6.1–8.1)
eGFR: 90 mL/min/{1.73_m2} (ref >=60)

## 2021-03-09 LAB — VITAMIN D 25 HYDROXY (VIT D DEFICIENCY, FRACTURES): Vit D, 25-Hydroxy: 62 ng/mL (ref 30–100)

## 2021-03-09 LAB — TSH: TSH: 0.61 mIU/L

## 2021-03-09 MED ORDER — DULOXETINE HCL 30 MG PO CPEP: ORAL_CAPSULE | ORAL | 3 refills | Status: DC

## 2021-03-09 MED ORDER — LACTULOSE 10 GM/15ML PO SOLN: 20.0000 g | Freq: Two times a day (BID) | ORAL | 0 refills | Status: DC

## 2021-03-09 MED ORDER — LOSARTAN POTASSIUM-HCTZ 100-25 MG PO TABS: 1.0000 | ORAL_TABLET | Freq: Every day | ORAL | 11 refills | Status: DC

## 2021-03-09 MED ORDER — QUETIAPINE FUMARATE 25 MG PO TABS: ORAL_TABLET | ORAL | 2 refills | Status: DC

## 2021-03-09 MED ORDER — DULOXETINE HCL 60 MG PO CPEP: ORAL_CAPSULE | ORAL | 3 refills | Status: DC

## 2021-03-09 MED ORDER — GABAPENTIN 600 MG PO TABS: 600.0000 mg | ORAL_TABLET | Freq: Three times a day (TID) | ORAL | 3 refills | Status: DC

## 2021-03-09 MED ORDER — VALACYCLOVIR HCL 500 MG PO TABS: ORAL_TABLET | ORAL | 0 refills | Status: DC

## 2021-03-13 ENCOUNTER — Telehealth: Payer: BC Managed Care – PPO | Admitting: Pain Medicine

## 2021-03-25 ENCOUNTER — Other Ambulatory Visit: Payer: Self-pay | Admitting: Adult Health

## 2021-03-30 ENCOUNTER — Other Ambulatory Visit: Payer: Self-pay | Admitting: Nurse Practitioner

## 2021-03-30 ENCOUNTER — Encounter: Payer: Self-pay | Admitting: Nurse Practitioner

## 2021-03-30 MED ORDER — LACTULOSE 10 GM/15ML PO SOLN: 20.0000 g | Freq: Two times a day (BID) | ORAL | 1 refills | Status: DC

## 2021-05-14 ENCOUNTER — Other Ambulatory Visit: Payer: Self-pay | Admitting: Nurse Practitioner

## 2021-05-14 DIAGNOSIS — K5903 Drug induced constipation: Secondary | ICD-10-CM

## 2021-05-24 ENCOUNTER — Other Ambulatory Visit: Payer: Self-pay | Admitting: Internal Medicine

## 2021-05-24 DIAGNOSIS — Z1231 Encounter for screening mammogram for malignant neoplasm of breast: Secondary | ICD-10-CM

## 2021-05-25 ENCOUNTER — Ambulatory Visit
Admission: RE | Admit: 2021-05-25 | Discharge: 2021-05-25 | Disposition: A | Discharge status: Home / Self Care | Payer: BC Managed Care – PPO | Source: Ambulatory Visit | Attending: Internal Medicine | Admitting: Internal Medicine

## 2021-05-25 DIAGNOSIS — Z1231 Encounter for screening mammogram for malignant neoplasm of breast: Secondary | ICD-10-CM

## 2021-05-26 ENCOUNTER — Other Ambulatory Visit: Payer: Self-pay | Admitting: Nurse Practitioner

## 2021-05-26 ENCOUNTER — Encounter: Payer: Self-pay | Admitting: Nurse Practitioner

## 2021-05-26 DIAGNOSIS — M797 Fibromyalgia: Secondary | ICD-10-CM

## 2021-05-26 MED ORDER — GABAPENTIN 600 MG PO TABS: ORAL_TABLET | ORAL | 1 refills | Status: DC

## 2021-06-02 NOTE — Progress Notes (Signed)
Assessment and Plan:  Wendy Valencia was seen today for follow-up.  Diagnoses and all orders for this visit:  Essential hypertension - continue medications, DASH diet, exercise and monitor at home. Call if greater than 130/80.   -     CBC with Differential/Platelet  Hyperlipidemia Continue diet and exercise - Check lipid and CMP - Check TSH  Fibromyalgia Continue to follow with pain clinic for Tramadol Continue medications, diet and exercise Continue to monitor symptoms  Overweight (BMI 25.0-29.9)  Continue Wegovy, diet and exercise  Vitamin D deficiency  Check VitD, continue Vit D supplementation  S/P gastric bypass -     COMPLETE METABOLIC PANEL WITH GFR  Anxiety state Controlled with cymbalta  Cold sores Continue Valtrex as needed  Drug Induced Constipation Encouraged to increase water consumption Lactulose 20g bid as needed Increase fiber intake  Abnormal glucose Continue diet and exercise -A1c  Further disposition pending results of labs. Discussed med's effects and SE's.   Over 30 minutes of exam, counseling, chart review, and critical decision making was performed.   Future Appointments  Date Time Provider Department Center  08/23/2021  3:00 PM Delano Metz, MD [facility] None  09/20/2021  3:00 PM Revonda Humphrey, NP [facility] None    ------------------------------------------------------------------------------------------------------------------   HPI BP 114/72   Pulse 77   Temp (!) 97.5 F (36.4 C)   Wt 173 lb (78.5 kg)   SpO2 98%   BMI 26.70 kg/m  56 y.o.female presents for 3 month recheck on fibromyalgia, hypertension, anxiety, overweight  She is following with pain management.  She is unable to increase tramadol for pain control and they recommended increasing Gabapentin. She is currently taking Gabapentin 600 mg TID and 900 mg at bedtime.She is also on Seroquel 25 mg 3 tabs at bedtime for sleep.   She is having a lot of constipation related to  pain medication, gastric bypass and Wegovy. She has left sided stomach pain. She is using lactulose and senna for constipation. She has been having more regular bowel movements.   She is not currently taking her iron due to constipation.    Currently taking Losartan 100/25 mg. BP well controlled BP Readings from Last 3 Encounters:  06/05/21 114/72  03/09/21 (!) 150/88  02/27/21 (!) 142/95    Recent mammogram 05/25/21- negative repeat 1 year    BMI is Body mass index is 26.7 kg/m., she has been working on diet and exercise. She is on Wegovy 2.4 mg SQ QW . She is s/p gastric bypass 2014. She is doing yoga , walking.  Feels less pain on days she is walking.  Wt Readings from Last 3 Encounters:  06/05/21 173 lb (78.5 kg)  03/09/21 170 lb 12.8 oz (77.5 kg)  02/27/21 170 lb (77.1 kg)   She is not on cholesterol medication and cholesterol is currently controlled with diet and exercise  Lab Results  Component Value Date   CHOL 160 03/09/2021   HDL 69 03/09/2021   LDLCALC 75 03/09/2021   TRIG 75 03/09/2021   CHOLHDL 2.3 03/09/2021    She has a history of abnormal glucose. Has been using diet and exercise.  Weight loss has improved her A1c- last value was: Lab Results  Component Value Date   HGBA1C 5.4 09/02/2020     Past Medical History:  Diagnosis Date   Allergy    Anemia    Anxiety    Arthritis    Fibromyalgia    GERD (gastroesophageal reflux disease)    Hypertension  IBS (irritable bowel syndrome)    Nephrolithiasis    Plantar fasciitis of right foot    wears boot      No Known Allergies  Current Outpatient Medications on File Prior to Visit  Medication Sig   Albuterol Sulfate (PROVENTIL HFA IN) Inhale 108 mcg into the lungs as needed.   Aspirin-Acetaminophen-Caffeine (EXCEDRIN EXTRA STRENGTH PO) Take 1 tablet by mouth 3 (three) times daily as needed (headache).    betamethasone dipropionate (DIPROLENE) 0.05 % ointment Apply topically 2 (two) times daily.    cetirizine-pseudoephedrine (ZYRTEC-D) 5-120 MG tablet Take 1 tablet by mouth every 12 (twelve) hours as needed for allergies.    diphenhydrAMINE (BENADRYL) 25 MG tablet Take 25-50 mg by mouth 3 (three) times daily as needed for allergies.   DULoxetine (CYMBALTA) 30 MG capsule TAKE 1 CAPSULE DAILY WITH 60 MG TABLET TO EQUAL 90 MG DAILY   DULoxetine (CYMBALTA) 60 MG capsule TAKE 1 CAPSULE DAILY WITH 30 MG TABLET TO EQUAL 90 MG   gabapentin (NEURONTIN) 600 MG tablet 1 tab three times a day and 1 1/2 tab at bedtime   Glucosamine-Chondroitin (COSAMIN DS PO) Take 1,500 mg by mouth daily.   Iron-FA-B Cmp-C-Biot-Probiotic (FUSION PLUS) CAPS Take 1 cap every 2-3 days as tolerated for chronic iron deficiency.   lactulose (CHRONULAC) 10 GM/15ML solution TAKE BY MOUTH TWO TIMES A DAY   losartan-hydrochlorothiazide (HYZAAR) 100-25 MG tablet Take 1 tablet by mouth daily.   Multiple Vitamins-Minerals (MULTIVITAMIN WITH MINERALS) tablet Take 1 tablet by mouth daily. .   QUEtiapine (SEROQUEL) 25 MG tablet Take three tablets by mouth every night at bedtime   traMADol (ULTRAM) 50 MG tablet Take 1 tablet (50 mg total) by mouth every 6 (six) hours as needed for severe pain. Each refill must last 30 days.   valACYclovir (VALTREX) 500 MG tablet TAKE 1 TABLET BY MOUTH DAILY AS NEEDED FOR COLD SORES   Vitamin D, Ergocalciferol, (DRISDOL) 1.25 MG (50000 UNIT) CAPS capsule TAKE 1 CAPSULE TWO TIMES A WEEK   WEGOVY 2.4 MG/0.75ML SOAJ INJECT 2.4 MG UNDER THE SKIN ONCE A WEEK   ferrous sulfate 325 (65 FE) MG EC tablet Take 325 mg by mouth every other day. Taking twice a week (Patient not taking: Reported on 06/05/2021)   NOVOFINE 32G X 6 MM MISC Needs daily for victoza   OVER THE COUNTER MEDICATION 540 mg. (Patient not taking: Reported on 06/05/2021)   Senna 30 MG MISC Take 30 mg by mouth daily. 4 tablets once per day (Patient not taking: Reported on 06/05/2021)   No current facility-administered medications on file prior  to visit.    Review of Systems  Constitutional:  Negative for chills, fever and weight loss.  HENT:  Positive for tinnitus. Negative for congestion and hearing loss.   Eyes:  Negative for blurred vision and double vision.  Respiratory:  Negative for cough and shortness of breath.   Cardiovascular:  Negative for chest pain, palpitations, orthopnea and leg swelling.  Gastrointestinal:  Positive for constipation and heartburn. Negative for abdominal pain, diarrhea, nausea and vomiting.  Genitourinary:  Negative for dysuria.  Musculoskeletal:  Positive for back pain, joint pain and myalgias. Negative for falls.  Skin:  Negative for rash.  Neurological:  Negative for dizziness, tingling, tremors, loss of consciousness and headaches.  Psychiatric/Behavioral:  Negative for depression, memory loss and suicidal ideas. The patient is nervous/anxious (worse when tried to wean off Cymbalta).   .   Physical Exam:  BP 114/72  Pulse 77   Temp (!) 97.5 F (36.4 C)   Wt 173 lb (78.5 kg)   SpO2 98%   BMI 26.70 kg/m   General Appearance: Well nourished, in no apparent distress. Eyes: PERRLA, EOMs, conjunctiva no swelling or erythema Sinuses: No Frontal/maxillary tenderness ENT/Mouth: Ext aud canals clear, TMs without erythema, bulging. No erythema, swelling, or exudate on post pharynx.  Tonsils not swollen or erythematous. Hearing normal.  Neck: Supple, thyroid normal.  Respiratory: Respiratory effort normal, BS equal bilaterally without rales, rhonchi, wheezing or stridor.  Cardio: RRR with no MRGs. Brisk peripheral pulses without edema.  Abdomen: Soft, + BS.  Non tender, no guarding, rebound, hernias, masses. Lymphatics: Non tender without lymphadenopathy.  Musculoskeletal: Full ROM, 5/5 strength, normal gait.  Skin: Warm, dry without rashes, lesions, ecchymosis.  Neuro: Cranial nerves intact. Normal muscle tone, no cerebellar symptoms. Sensation intact.  Psych: Awake and oriented X 3, normal  affect, Insight and Judgment appropriate.     Revonda HumphreyANA W Henery Betzold, NP 4:17 PM [facility]

## 2021-06-05 ENCOUNTER — Encounter: Payer: Self-pay | Admitting: Nurse Practitioner

## 2021-06-05 ENCOUNTER — Ambulatory Visit (INDEPENDENT_AMBULATORY_CARE_PROVIDER_SITE_OTHER): Payer: BC Managed Care – PPO | Admitting: Nurse Practitioner

## 2021-06-05 VITALS — BP 114/72 | HR 77 | Temp 97.5°F | Wt 173.0 lb

## 2021-06-05 DIAGNOSIS — E663 Overweight: Secondary | ICD-10-CM

## 2021-06-05 DIAGNOSIS — I10 Essential (primary) hypertension: Secondary | ICD-10-CM | POA: Diagnosis not present

## 2021-06-05 DIAGNOSIS — Z79899 Other long term (current) drug therapy: Secondary | ICD-10-CM

## 2021-06-05 DIAGNOSIS — K5903 Drug induced constipation: Secondary | ICD-10-CM

## 2021-06-05 DIAGNOSIS — R7309 Other abnormal glucose: Secondary | ICD-10-CM | POA: Diagnosis not present

## 2021-06-05 DIAGNOSIS — B001 Herpesviral vesicular dermatitis: Secondary | ICD-10-CM

## 2021-06-05 DIAGNOSIS — Z9884 Bariatric surgery status: Secondary | ICD-10-CM | POA: Diagnosis not present

## 2021-06-05 DIAGNOSIS — E785 Hyperlipidemia, unspecified: Secondary | ICD-10-CM

## 2021-06-05 DIAGNOSIS — M797 Fibromyalgia: Secondary | ICD-10-CM

## 2021-06-05 DIAGNOSIS — F411 Generalized anxiety disorder: Secondary | ICD-10-CM

## 2021-06-05 DIAGNOSIS — E559 Vitamin D deficiency, unspecified: Secondary | ICD-10-CM

## 2021-06-06 LAB — LIPID PANEL
Cholesterol: 152 mg/dL (ref <200)
HDL: 66 mg/dL (ref >=50)
LDL Cholesterol (Calc): 67 mg/dL (calc)
Non-HDL Cholesterol (Calc): 86 mg/dL (calc) (ref <130)
Total CHOL/HDL Ratio: 2.3 (calc) (ref <5.0)
Triglycerides: 104 mg/dL (ref <150)

## 2021-06-06 LAB — HEMOGLOBIN A1C
Hgb A1c MFr Bld: 5.2 % of total Hgb (ref <5.7)
Mean Plasma Glucose: 103 mg/dL
eAG (mmol/L): 5.7 mmol/L

## 2021-06-06 LAB — CBC WITH DIFFERENTIAL/PLATELET
Absolute Monocytes: 385 cells/uL (ref 200–950)
Basophils Absolute: 21 cells/uL (ref 0–200)
Basophils Relative: 0.5 %
Eosinophils Absolute: 111 cells/uL (ref 15–500)
Eosinophils Relative: 2.7 %
HCT: 38.8 % (ref 35.0–45.0)
Hemoglobin: 13.2 g/dL (ref 11.7–15.5)
Lymphs Abs: 1677 cells/uL (ref 850–3900)
MCH: 30.6 pg (ref 27.0–33.0)
MCHC: 34 g/dL (ref 32.0–36.0)
MCV: 89.8 fL (ref 80.0–100.0)
MPV: 10.1 fL (ref 7.5–12.5)
Monocytes Relative: 9.4 %
Neutro Abs: 1907 cells/uL (ref 1500–7800)
Neutrophils Relative %: 46.5 %
Platelets: 322 10*3/uL (ref 140–400)
RBC: 4.32 10*6/uL (ref 3.80–5.10)
RDW: 13.2 % (ref 11.0–15.0)
Total Lymphocyte: 40.9 %
WBC: 4.1 10*3/uL (ref 3.8–10.8)

## 2021-06-06 LAB — COMPLETE METABOLIC PANEL WITH GFR
AG Ratio: 1.6 (calc) (ref 1.0–2.5)
ALT: 13 U/L (ref 6–29)
AST: 15 U/L (ref 10–35)
Albumin: 4.2 g/dL (ref 3.6–5.1)
Alkaline phosphatase (APISO): 137 U/L (ref 37–153)
BUN: 14 mg/dL (ref 7–25)
CO2: 28 mmol/L (ref 20–32)
Calcium: 9.4 mg/dL (ref 8.6–10.4)
Chloride: 105 mmol/L (ref 98–110)
Creat: 0.76 mg/dL (ref 0.50–1.03)
Globulin: 2.6 g/dL (calc) (ref 1.9–3.7)
Glucose, Bld: 87 mg/dL (ref 65–99)
Potassium: 4.1 mmol/L (ref 3.5–5.3)
Sodium: 141 mmol/L (ref 135–146)
Total Bilirubin: 0.3 mg/dL (ref 0.2–1.2)
Total Protein: 6.8 g/dL (ref 6.1–8.1)
eGFR: 92 mL/min/{1.73_m2} (ref >=60)

## 2021-06-06 LAB — TSH: TSH: 0.68 mIU/L

## 2021-07-20 ENCOUNTER — Other Ambulatory Visit: Payer: Self-pay | Admitting: Nurse Practitioner

## 2021-07-20 DIAGNOSIS — B001 Herpesviral vesicular dermatitis: Secondary | ICD-10-CM

## 2021-07-22 ENCOUNTER — Other Ambulatory Visit: Payer: Self-pay | Admitting: Nurse Practitioner

## 2021-07-22 DIAGNOSIS — M797 Fibromyalgia: Secondary | ICD-10-CM

## 2021-08-23 ENCOUNTER — Encounter: Payer: BC Managed Care – PPO | Admitting: Pain Medicine

## 2021-08-27 NOTE — Progress Notes (Signed)
PROVIDER NOTE: Information contained herein reflects review and annotations entered in association with encounter. Interpretation of such information and data should be left to medically-trained personnel. Information provided to patient can be located elsewhere in the medical record under "Patient Instructions". Document created using STT-dictation technology, any transcriptional errors that may result from process are unintentional.    Patient: Wendy Valencia  Service Category: E/M  Provider: Gaspar Cola, MD  DOB: July 23, 1965  DOS: 08/28/2021  Referring Provider: Unk Pinto, MD  MRN: [id number]  Specialty: Interventional Pain Management  PCP: Unk Pinto, MD  Type: Established Patient  Setting: Ambulatory outpatient    Location: Office  Delivery: Face-to-face     HPI  Wendy Valencia, a 56 y.o. year old female, is here today because of her No primary diagnosis found.. Wendy Valencia's primary complain today is Back Pain Last encounter: My last encounter with her was on 03/08/2021. Pertinent problems: Wendy Valencia has Cervical spondylosis without myelopathy; Fibromyalgia affecting multiple sites (2ry area of Pain); Spondylosis of lumbar spine; Chronic generalized pain (1ry area of Pain); Chronic pain syndrome; Chronic shoulder pain (3ry area of Pain) (Bilateral) (R>L); Chronic hip pain (4th area of Pain) (Bilateral) (R>L); Greater trochanteric bursitis of hips (Bilateral); Osteoarthritis of hips (Bilateral); Lumbar facet syndrome (Right); Spondylosis without myelopathy or radiculopathy, lumbosacral region; Chronic hip pain (Left); and Chronic low back pain (Right) w/o sciatica on their pertinent problem list. Pain Assessment: Severity of Chronic pain is reported as a 4 /10. Location: Back Mid, Lower/hips bilateral. Onset: More than a month ago. Quality: Aching, Discomfort, Dull. Timing: Intermittent. Modifying factor(s): medications. Vitals:  height is 5' 7.5" (1.715 m) and  weight is 170 lb (77.1 kg). Her temperature is 97.1 F (36.2 C) (abnormal). Her blood pressure is 138/89 and her pulse is 76. Her respiration is 16 and oxygen saturation is 100%.   Reason for encounter: medication management.  The patient indicates doing well with the current medication regimen. No adverse reactions or side effects reported to the medications.   RTCB: 03/03/2022  Pharmacotherapy Assessment  Analgesic: Tramadol 50 mg 2 tablets twice daily (200 mg/day of tramadol) MME/day: 20 mg/day.   Monitoring: [place] PMP: PDMP reviewed during this encounter.       Pharmacotherapy: No side-effects or adverse reactions reported. Compliance: No problems identified. Effectiveness: Clinically acceptable.  Ignatius Specking, RN  08/28/2021  8:50 AM  Sign when Signing Visit Nursing Pain Medication Assessment:  Safety precautions to be maintained throughout the outpatient stay will include: orient to surroundings, keep bed in low position, maintain call bell within reach at all times, provide assistance with transfer out of bed and ambulation.  Medication Inspection Compliance: Pill count conducted under aseptic conditions, in front of the patient. Neither the pills nor the bottle was removed from the patient's sight at any time. Once count was completed pills were immediately returned to the patient in their original bottle.  Medication: Tramadol (Ultram) Pill/Patch Count:  8 of 120 pills remain Pill/Patch Appearance: Markings consistent with prescribed medication Bottle Appearance: Standard pharmacy container. Clearly labeled. Filled Date: 7 / 43 / 2023 Last Medication intake:  Today    UDS:  Summary  Date Value Ref Range Status  02/27/2021 Note  Final    Comment:    ==================================================================== [employer] Select 13 (MW) ==================================================================== Test                             Result  Flag        Units  Drug Present and Declared for Prescription Verification   Tramadol                       >6024        EXPECTED   ng/mg creat   O-Desmethyltramadol            >6024        EXPECTED   ng/mg creat   N-Desmethyltramadol            >6024        EXPECTED   ng/mg creat    Source of tramadol is a prescription medication. O-desmethyltramadol    and N-desmethyltramadol are expected metabolites of tramadol.  ==================================================================== Test                      Result    Flag   Units      Ref Range   Creatinine              83               mg/dL      >=20 ==================================================================== Declared Medications:  The flagging and interpretation on this report are based on the  following declared medications.  Unexpected results may arise from  inaccuracies in the declared medications.   **Note: The testing scope of this panel includes these medications:   Tramadol (Ultram)   **Note: The testing scope of this panel does not include the  following reported medications:   Acetaminophen  Albuterol  Aspirin  Betamethasone  Caffeine  Cetirizine (Zyrtec-D)  Chondroitin  Diphenhydramine (Benadryl)  Duloxetine (Cymbalta)  Gabapentin (Neurontin)  Glucosamine  Hydrochlorothiazide (Hyzaar)  Iron  Losartan (Hyzaar)  Multivitamin  Probiotic  Pseudoephedrine (Zyrtec-D)  Quetiapine (Seroquel)  Semaglutide (Wegovy)  Sennosides (Senna)  Valacyclovir (Valtrex)  Vitamin B  Vitamin C  Vitamin D2 (Drisdol) ==================================================================== For clinical consultation, please call [phone]. ====================================================================      ROS  Constitutional: Denies any fever or chills Gastrointestinal: No reported hemesis, hematochezia, vomiting, or acute GI distress Musculoskeletal: Denies any acute onset joint swelling, redness, loss of ROM, or  weakness Neurological: No reported episodes of acute onset apraxia, aphasia, dysarthria, agnosia, amnesia, paralysis, loss of coordination, or loss of consciousness  Medication Review  Albuterol Sulfate, Aspirin-Acetaminophen-Caffeine, DULoxetine, Fusion Plus, Glucosamine-Chondroitin, Insulin Pen Needle, QUEtiapine, Semaglutide-Weight Management, Vitamin D (Ergocalciferol), betamethasone dipropionate, cetirizine-pseudoephedrine, diphenhydrAMINE, gabapentin, lactulose, losartan-hydrochlorothiazide, multivitamin with minerals, traMADol, and valACYclovir  History Review  Allergy: Wendy Valencia has No Known Allergies. Drug: Wendy Valencia  reports no history of drug use. Alcohol:  reports current alcohol use of about 1.0 standard drink of alcohol per week. Tobacco:  reports that she has been smoking cigarettes. She started smoking about 32 years ago. She has a 4.65 pack-year smoking history. She has never used smokeless tobacco. Social: Wendy Valencia  reports that she has been smoking cigarettes. She started smoking about 32 years ago. She has a 4.65 pack-year smoking history. She has never used smokeless tobacco. She reports current alcohol use of about 1.0 standard drink of alcohol per week. She reports that she does not use drugs. Medical:  has a past medical history of Allergy, Anemia, Anxiety, Arthritis, Fibromyalgia, GERD (gastroesophageal reflux disease), Hypertension, IBS (irritable bowel syndrome), Nephrolithiasis, and Plantar fasciitis of right foot. Surgical: Wendy Valencia  has a past surgical history that includes Left shoulder arthroscopic surgery (Left); tummy tuck (2005);  Breath tek h pylori (07/11/2011); Gastric Roux-En-Y (10/01/2011); Cholecystectomy (10/01/2011); and Colonoscopy. Family: family history includes Breast cancer (age of onset: 58) in her mother; Breast cancer (age of onset: 23) in her maternal aunt; Cancer in her maternal grandmother; Cancer (age of onset: 58) in her  mother; Heart attack in her maternal grandfather and paternal grandfather; Heart disease in her father and maternal grandfather; Hypertension in her father; Lung cancer in her maternal grandmother; Multiple sclerosis in her maternal uncle; Obesity in her brother; Stroke in her father.  Laboratory Chemistry Profile   Renal Lab Results  Component Value Date   BUN 14 06/05/2021   CREATININE 0.76 06/05/2021   LABCREA 105 32/54/9826   BCR NOT APPLICABLE 41/58/3094   GFRAA 113 12/14/2019   GFRNONAA 97 12/14/2019    Hepatic Lab Results  Component Value Date   AST 15 06/05/2021   ALT 13 06/05/2021   ALBUMIN 4.5 03/05/2016   ALKPHOS 123 (H) 03/05/2016    Electrolytes Lab Results  Component Value Date   NA 141 06/05/2021   K 4.1 06/05/2021   CL 105 06/05/2021   CALCIUM 9.4 06/05/2021   MG 2.3 11/08/2020    Bone Lab Results  Component Value Date   VD25OH 62 03/09/2021    Inflammation (CRP: Acute Phase) (ESR: Chronic Phase) Lab Results  Component Value Date   CRP 0.9 03/04/2017   ESRSEDRATE 22 (H) 03/04/2017         Note: Above Lab results reviewed.  Recent Imaging Review  MM 3D SCREEN BREAST BILATERAL CLINICAL DATA:  Screening.  EXAM: DIGITAL SCREENING BILATERAL MAMMOGRAM WITH TOMOSYNTHESIS AND CAD  TECHNIQUE: Bilateral screening digital craniocaudal and mediolateral oblique mammograms were obtained. Bilateral screening digital breast tomosynthesis was performed. The images were evaluated with computer-aided detection.  COMPARISON:  Previous exam(s).  ACR Breast Density Category b: There are scattered areas of fibroglandular density.  FINDINGS: There are no findings suspicious for malignancy.  IMPRESSION: No mammographic evidence of malignancy. A result letter of this screening mammogram will be mailed directly to the patient.  RECOMMENDATION: Screening mammogram in one year. (Code:SM-B-01Y)  BI-RADS CATEGORY  1: Negative.  Electronically Signed   By:  Abelardo Diesel M.D.   On: 05/26/2021 09:34 Note: Reviewed        Physical Exam  General appearance: Well nourished, well developed, and well hydrated. In no apparent acute distress Mental status: Alert, oriented x 3 (person, place, & time)       Respiratory: No evidence of acute respiratory distress Eyes: PERLA Vitals: BP 138/89   Pulse 76   Temp (!) 97.1 F (36.2 C)   Resp 16   Ht 5' 7.5" (1.715 m)   Wt 170 lb (77.1 kg)   SpO2 100%   BMI 26.23 kg/m  BMI: Estimated body mass index is 26.23 kg/m as calculated from the following:   Height as of this encounter: 5' 7.5" (1.715 m).   Weight as of this encounter: 170 lb (77.1 kg). Ideal: Ideal body weight: 62.7 kg (138 lb 5.4 oz) Adjusted ideal body weight: 68.5 kg (151 lb)  Assessment   Diagnosis Status  1. Chronic pain syndrome   2. Chronic generalized pain (1ry area of Pain)   3. Fibromyalgia affecting multiple sites (2ry area of Pain)   4. Chronic shoulder pain (3ry area of Pain) (Bilateral) (R>L)   5. Chronic hip pain (4th area of Pain) (Bilateral) (R>L)   6. Osteoarthritis of hips (Bilateral)   7. Pharmacologic therapy  8. Chronic use of opiate for therapeutic purpose   9. Encounter for medication management    Controlled Controlled Controlled   Updated Problems: No problems updated.  Plan of Care  Problem-specific:  No problem-specific Assessment & Plan notes found for this encounter.  Wendy Valencia has a current medication list which includes the following long-term medication(s): duloxetine, duloxetine, gabapentin, losartan-hydrochlorothiazide, quetiapine, and [START ON 09/04/2021] tramadol.  Pharmacotherapy (Medications Ordered): Meds ordered this encounter  Medications   traMADol (ULTRAM) 50 MG tablet    Sig: Take 1 tablet (50 mg total) by mouth every 6 (six) hours as needed for severe pain. Each refill must last 30 days.    Dispense:  120 tablet    Refill:  5    DO NOT: delete (not duplicate);  no partial-fill (will deny script to complete), no refill request (F/U required). DISPENSE: 1 day early if closed on fill date. WARN: No CNS-depressants within 8 hrs of med.   Orders:  No orders of the defined types were placed in this encounter.  Follow-up plan:   Return in about 6 months (around 03/03/2022) for Eval-day (M,W), (F2F), (MM).     Interventional Therapies  Risk  Complexity Considerations:   Estimated body mass index is 26.23 kg/m as calculated from the following:   Height as of 02/27/21: 5' 7.5" (1.715 m).   Weight as of 02/27/21: 170 lb (77.1 kg). WNL   Planned  Pending:      Under consideration:      Completed:   Diagnostic bilateral IA hip joint inj. x1 (06/25/2017) (100/100/70/75-100)  Diagnostic bilateral superficial trochanteric bursa inj. x1 (06/25/2017) (100/100/70/75-100)    Therapeutic  Palliative (PRN) options:   Therapeutic bilateral hip & TB inj. #2      Recent Visits No visits were found meeting these conditions. Showing recent visits within past 90 days and meeting all other requirements Today's Visits Date Type Provider Dept  08/28/21 Office Visit Milinda Pointer, MD [facility]  Showing today's visits and meeting all other requirements Future Appointments No visits were found meeting these conditions. Showing future appointments within next 90 days and meeting all other requirements  I discussed the assessment and treatment plan with the patient. The patient was provided an opportunity to ask questions and all were answered. The patient agreed with the plan and demonstrated an understanding of the instructions.  Patient advised to call back or seek an in-person evaluation if the symptoms or condition worsens.  Duration of encounter: 30 minutes.  Total time on encounter, as per AMA guidelines included both the face-to-face and non-face-to-face time personally spent by the physician and/or other qualified health care  professional(s) on the day of the encounter (includes time in activities that require the physician or other qualified health care professional and does not include time in activities normally performed by clinical staff). Physician's time may include the following activities when performed: preparing to see the patient (eg, review of tests, pre-charting review of records) obtaining and/or reviewing separately obtained history performing a medically appropriate examination and/or evaluation counseling and educating the patient/family/caregiver ordering medications, tests, or procedures referring and communicating with other health care professionals (when not separately reported) documenting clinical information in the electronic or other health record independently interpreting results (not separately reported) and communicating results to the patient/ family/caregiver care coordination (not separately reported)  Note by: Gaspar Cola, MD Date: 08/28/2021; Time: 8:54 AM

## 2021-08-27 NOTE — Patient Instructions (Signed)

## 2021-08-28 ENCOUNTER — Ambulatory Visit: Payer: BC Managed Care – PPO | Attending: Pain Medicine | Admitting: Pain Medicine

## 2021-08-28 ENCOUNTER — Encounter: Payer: Self-pay | Admitting: Pain Medicine

## 2021-08-28 DIAGNOSIS — M25552 Pain in left hip: Secondary | ICD-10-CM | POA: Diagnosis not present

## 2021-08-28 DIAGNOSIS — Z79899 Other long term (current) drug therapy: Secondary | ICD-10-CM | POA: Diagnosis not present

## 2021-08-28 DIAGNOSIS — M25511 Pain in right shoulder: Secondary | ICD-10-CM

## 2021-08-28 DIAGNOSIS — R52 Pain, unspecified: Secondary | ICD-10-CM | POA: Insufficient documentation

## 2021-08-28 DIAGNOSIS — G894 Chronic pain syndrome: Secondary | ICD-10-CM

## 2021-08-28 DIAGNOSIS — M25512 Pain in left shoulder: Secondary | ICD-10-CM

## 2021-08-28 DIAGNOSIS — G8929 Other chronic pain: Secondary | ICD-10-CM

## 2021-08-28 DIAGNOSIS — Z79891 Long term (current) use of opiate analgesic: Secondary | ICD-10-CM | POA: Diagnosis not present

## 2021-08-28 DIAGNOSIS — M25551 Pain in right hip: Secondary | ICD-10-CM | POA: Insufficient documentation

## 2021-08-28 DIAGNOSIS — M797 Fibromyalgia: Secondary | ICD-10-CM | POA: Diagnosis not present

## 2021-08-28 DIAGNOSIS — M160 Bilateral primary osteoarthritis of hip: Secondary | ICD-10-CM | POA: Diagnosis not present

## 2021-08-28 MED ORDER — TRAMADOL HCL 50 MG PO TABS: 50.0000 mg | ORAL_TABLET | Freq: Four times a day (QID) | ORAL | 5 refills | Status: DC | PRN

## 2021-08-28 NOTE — Progress Notes (Signed)
Nursing Pain Medication Assessment:  Safety precautions to be maintained throughout the outpatient stay will include: orient to surroundings, keep bed in low position, maintain call bell within reach at all times, provide assistance with transfer out of bed and ambulation.  Medication Inspection Compliance: Pill count conducted under aseptic conditions, in front of the patient. Neither the pills nor the bottle was removed from the patient's sight at any time. Once count was completed pills were immediately returned to the patient in their original bottle.  Medication: Tramadol (Ultram) Pill/Patch Count:  8 of 120 pills remain Pill/Patch Appearance: Markings consistent with prescribed medication Bottle Appearance: Standard pharmacy container. Clearly labeled. Filled Date: 7 / 71 / 2023 Last Medication intake:  Today

## 2021-09-06 ENCOUNTER — Encounter: Payer: BC Managed Care – PPO | Admitting: Nurse Practitioner

## 2021-09-11 ENCOUNTER — Other Ambulatory Visit: Payer: Self-pay

## 2021-09-11 MED ORDER — WEGOVY 2.4 MG/0.75ML ~~LOC~~ SOAJ: SUBCUTANEOUS | 5 refills | Status: DC

## 2021-09-19 NOTE — Progress Notes (Unsigned)
Complete Physical  Assessment and Plan:  Encounter for Annual Physical Exam with abnormal findings Due annually  Health Maintenance reviewed Healthy lifestyle reviewed and goals set Mammogram UTD 05/25/21 Negative Going to gynecology for pap and breast exam  Essential hypertension - discussed goal <130/80, increase zestoretic, 4-6 week follow up - DASH diet, exercise and monitor at home. Call if greater than 130/80.  -     CBC with Differential/Platelet -     COMPLETE METABOLIC PANEL WITH GFR -     TSH -     Urinalysis, Routine w reflex microscopic -     Microalbumin / creatinine urine ratio  Mixed hyperlipidemia check lipids decrease fatty foods increase activity.  -     Lipid panel  Abnormal Glucose On semaglutide Discussed disease progression and risks Discussed diet/exercise, weight management and risk modification -     Hemoglobin A1c  Fibromyalgia affecting multiple sites (Secondary Area of Pain) Continue follow up pain management Continue Gabapentin, duloxetine and Tramadol  Chronic pain syndrome Continue follow up pain management Continue Gabapentin, duloxetine and Tramadol  Long term current use of opiate analgesic Continue follow up pain management  Constipation, secondary to opioid Trulance, linzess causes diarrhea at times Would like to try motegrity; discussed and sent in  Continue squatty potty, fiber, water  Tobacco abuse Discussed risks associated with tobacco use and advised to reduce or quit Patient is currently smoking 1 pack a week Declines Chantix or other medication Will follow up at the next visit  Prolonged QT on ECG Refer to cardiology for evaluation If confirmed by cardiology will need to discuss pain medications and possible changes with pain medications  Anxiety state Continue meds; well controlled Continue therapy as needed  Insomnia secondary to chronic pain  Insomnia- good sleep hygiene discussed, increase day time  activity Continue medications  Medication management -     Magnesium  Vitamin D deficiency -     VITAMIN D 25 Hydroxy (Vit-D Deficiency, Fractures)  Overweight with co morbidities - BMI 29 Doing very well with wegovy Long discussion about weight loss, diet, and exercise Discussed ideal weight for height  Patient will work on increasing exercise, fiber in diet Will follow up in 1 month  S/P gastric bypass, screening nutritional deficiencies -     Iron, Total/Total Iron Binding Cap -     Vitamin B12      Discussed med's effects and SE's. Screening labs and tests as requested with regular follow-up as recommended. Over 40 minutes of exam, counseling, chart review, and complex, high level critical decision making was performed this visit.   Future Appointments  Date Time Provider [place]  09/21/2021 11:00 AM Noralyn Pick, NP [facility]  02/19/2022  9:40 AM Milinda Pointer, MD [facility] None  09/24/2022  3:00 PM Alycia Rossetti, NP [facility] None     HPI  56 y.o. female  presents for a complete physical. She has Anxiety state; Essential hypertension; Tobacco abuse; Medication management; Vitamin D deficiency; S/P gastric bypass; Cervical spondylosis without myelopathy; Fibromyalgia affecting multiple sites (2ry area of Pain); Spondylosis of lumbar spine; Chronic generalized pain (1ry area of Pain); Chronic pain syndrome; Long term current use of opiate analgesic; Chronic shoulder pain (3ry area of Pain) (Bilateral) (R>L); Chronic hip pain (4th area of Pain) (Bilateral) (R>L); Greater trochanteric bursitis of hips (Bilateral); Osteoarthritis of hips (Bilateral); Insomnia secondary to chronic pain; Abnormal glucose; Hyperlipidemia; Pharmacologic therapy; Uncomplicated opioid dependence ([place]); Lumbar facet syndrome (Right); Spondylosis without myelopathy  or radiculopathy, lumbosacral region; Chronic hip pain (Left); Chronic low back pain (Right) w/o sciatica;  Iron deficiency; Overweight (BMI 25.0-29.9); Abnormal drug screen (08/25/2020); and Marijuana use on their problem list.  She is married, has a son and step children, no grandchildren. 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 Enjoys her job. 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   She has chronic pain, DJD, fibromyalgia and follows with pain clinic, Dr. Lowella Dandy. She is taking tramadol, gabapentin, excedrine, motrin managed by him. She will still have flares. Continues to follow with pain medication  She also has hx of anxiety and insomnia; had negative sleep study for organic causes, deemed chronic insomnia secondary to pain. She is prescribed cymbalta 90 mg, seroquel 75 mg QHS. She has followed with therapy, reports doing very well recently.   BMI is Body mass index is 27.31 kg/m., she is working on diet and exercise. s/p gastric bypass Sept 2013. She regained weight up to 255 lb 08/2018. She failed phentermine/topamax, fair results with saxenda but transitioned to [facility] for higher dose, has done well since, down 70 lb since that time. Does have constipation, had diarrhea with linzess, trulance (also bloating), would like to try motegrity.  Wt Readings from Last 3 Encounters:  09/20/21 174 lb 6.4 oz (79.1 kg)  08/28/21 170 lb (77.1 kg)  06/05/21 173 lb (78.5 kg)     She is having constipation and is using Magnesium but taking 2000 mg daily.  Takes 553m 1 in am 1 at lunch and 2 at bedtime.  She has been a little more regular with the high dose of Magnesium. She has been noticing Bms are much thinner.  Has GI appointment tomorrow with Dr. SFuller Plan She will occasionally feel like meat will get stuck, difficulty digesting.   Her blood pressure has been controlled at home, today their BP is BP: 120/78  BP Readings from Last 3 Encounters:  09/20/21 120/78  08/28/21 138/89  06/05/21 114/72  She does workout, slowly increasing, walking. She denies chest pain, shortness of breath, dizziness.  She is not on  cholesterol medication and denies myalgias. Her cholesterol is at goal. The cholesterol last visit was:   Lab Results  Component Value Date   CHOL 152 06/05/2021   HDL 66 06/05/2021   LDLCALC 67 06/05/2021   TRIG 104 06/05/2021   CHOLHDL 2.3 06/05/2021   She has been working on diet and exercise for prediabetes, and denies paresthesia of the feet, polydipsia, polyuria and visual disturbances. Last A1C in the office was:  Lab Results  Component Value Date   HGBA1C 5.2 06/05/2021   Patient is on Vitamin D supplement.   Lab Results  Component Value Date   VD25OH 61602/23/2023     She has had elevated alk phos with normal Ab [place] GGT and bone scan in 2001. Has had negative autoimmune workup in the past.   She reports stopped iron supplement 2 months ago, was having constipation.  Lab Results  Component Value Date   IRON 51 11/08/2020   TIBC 374 11/08/2020   FERRITIN 6 (L) 09/02/2020   Lab Results  Component Value Date   VYBOFBPZW25 85210/25/2022     Current Medications:  Current Outpatient Medications on File Prior to Visit  Medication Sig Dispense Refill   Albuterol Sulfate (PROVENTIL HFA IN) Inhale 108 mcg into the lungs as needed.     Aspirin-Acetaminophen-Caffeine (EXCEDRIN EXTRA STRENGTH PO) Take 1 tablet by mouth 3 (three) times daily as needed (headache).  betamethasone dipropionate (DIPROLENE) 0.05 % ointment Apply topically 2 (two) times daily. 30 g 0   cetirizine-pseudoephedrine (ZYRTEC-D) 5-120 MG tablet Take 1 tablet by mouth every 12 (twelve) hours as needed for allergies.      diphenhydrAMINE (BENADRYL) 25 MG tablet Take 25-50 mg by mouth 3 (three) times daily as needed for allergies.     DULoxetine (CYMBALTA) 30 MG capsule TAKE 1 CAPSULE DAILY WITH 60 MG TABLET TO EQUAL 90 MG DAILY 90 capsule 3   DULoxetine (CYMBALTA) 60 MG capsule TAKE 1 CAPSULE DAILY WITH 30 MG TABLET TO EQUAL 90 MG 90 capsule 3   Glucosamine-Chondroitin (COSAMIN DS PO) Take 1,500 mg by  mouth daily.     losartan-hydrochlorothiazide (HYZAAR) 100-25 MG tablet Take 1 tablet by mouth daily. 30 tablet 11   Multiple Vitamins-Minerals (MULTIVITAMIN WITH MINERALS) tablet Take 1 tablet by mouth daily. Marland Kitchen     NOVOFINE 32G X 6 MM MISC Needs daily for victoza 100 each 5   QUEtiapine (SEROQUEL) 25 MG tablet Take three tablets by mouth every night at bedtime 270 tablet 2   Semaglutide-Weight Management (WEGOVY) 2.4 MG/0.75ML SOAJ INJECT 2.4 MG UNDER THE SKIN ONCE A WEEK 3 mL 5   traMADol (ULTRAM) 50 MG tablet Take 1 tablet (50 mg total) by mouth every 6 (six) hours as needed for severe pain. Each refill must last 30 days. 120 tablet 5   valACYclovir (VALTREX) 500 MG tablet TAKE ONE TABLET BY MOUTH DAILY AS NEEDED FOR COLD SORES 30 tablet 0   Iron-FA-B Cmp-C-Biot-Probiotic (FUSION PLUS) CAPS Take 1 cap every 2-3 days as tolerated for chronic iron deficiency. (Patient not taking: Reported on 09/20/2021) 30 capsule 0   lactulose (CHRONULAC) 10 GM/15ML solution TAKE 30MLS BY MOUTH TWO TIMES A DAY (Patient not taking: Reported on 09/20/2021) 946 mL 1   Vitamin D, Ergocalciferol, (DRISDOL) 1.25 MG (50000 UNIT) CAPS capsule TAKE 1 CAPSULE TWO TIMES A WEEK (Patient not taking: Reported on 09/20/2021) 26 capsule 3   No current facility-administered medications on file prior to visit.   Health Maintenance:   Immunization History  Administered Date(s) Administered   Influenza Inj Mdck Quad With Preservative 10/23/2016, 12/14/2019   Influenza Split 10/02/2011, 10/16/2013, 11/15/2014   Influenza,inj,Quad PF,6+ Mos 11/08/2020   PFIZER(Purple Top)SARS-COV-2 Vaccination 04/03/2019, 04/29/2019   Pneumococcal Polysaccharide-23 10/02/2011   Tdap 08/30/2016    Tetanus: 2018 Pneumovax: 2013 Prevnar 13: N/A Flu vaccine: 11/2019, recommended in Oct Shingrix: reports has had, will send dates Covid 54: 2/2, pfizer - has had booster  LMP: post menopausal Pap: 08/2016 NEG HPV, due next year MGM: 03/04/2020 DEXA:  09/2013 -  Colonoscopy: 04/2017 normal EGD: 2013 Myoview stress test 10/2010 Sleep study 05/2018  Last Dental Exam: Dr. Quincy Simmonds Last Eye Exam: Dr Randall Hiss DERM Dr. Raquel Sarna, OB/GYN  Patient Care Team: Unk Pinto, MD as PCP - General (Internal Medicine) Ladene Artist, MD as Consulting Physician (Gastroenterology) Unk Pinto, MD as Referring Physician (Internal Medicine)  Medical History:  Past Medical History:  Diagnosis Date   Allergy    Anemia    Anxiety    Arthritis    Fibromyalgia    GERD (gastroesophageal reflux disease)    Hypertension    IBS (irritable bowel syndrome)    Nephrolithiasis    Plantar fasciitis of right foot    wears boot    Allergies No Known Allergies  SURGICAL HISTORY She  has a past surgical history that includes Left shoulder arthroscopic surgery (Left); tummy tuck (2005);  Breath tek h pylori (07/11/2011); Gastric Roux-En-Y (10/01/2011); Cholecystectomy (10/01/2011); and Colonoscopy.   FAMILY HISTORY Her family history includes Breast cancer (age of onset: 51) in her mother; Breast cancer (age of onset: 48) in her maternal aunt; Cancer in her maternal grandmother; Cancer (age of onset: 61) in her mother; Heart attack in her maternal grandfather and paternal grandfather; Heart disease in her father and maternal grandfather; Hypertension in her father; Lung cancer in her maternal grandmother; Multiple sclerosis in her maternal uncle; Obesity in her brother; Stroke in her father.   SOCIAL HISTORY She  reports that she has been smoking cigarettes. She started smoking about 32 years ago. She has a 4.65 pack-year smoking history. She has never used smokeless tobacco. She reports current alcohol use of about 1.0 standard drink of alcohol per week. She reports that she does not use drugs.   Review of Systems: Review of Systems  Constitutional:  Negative for chills, diaphoresis, fever, malaise/fatigue and weight loss.  HENT:  Negative for  hearing loss and tinnitus.   Eyes: Negative.  Negative for blurred vision and double vision.  Respiratory: Negative.  Negative for cough, sputum production, shortness of breath and wheezing.   Cardiovascular: Negative.  Negative for chest pain, palpitations, orthopnea, claudication, leg swelling and PND.  Gastrointestinal:  Positive for constipation. Negative for abdominal pain, blood in stool, diarrhea, heartburn, melena, nausea and vomiting.  Genitourinary: Negative.   Musculoskeletal:  Positive for back pain, joint pain, myalgias and neck pain. Negative for falls.  Skin: Negative.  Negative for rash.  Neurological: Negative.  Negative for dizziness, tingling, sensory change, weakness and headaches.  Endo/Heme/Allergies:  Negative for polydipsia.  Psychiatric/Behavioral: Negative.  Negative for depression, memory loss, substance abuse and suicidal ideas. The patient is not nervous/anxious and does not have insomnia.   All other systems reviewed and are negative.   Physical Exam: Estimated body mass index is 27.31 kg/m as calculated from the following:   Height as of this encounter: '5\' 7"'  (1.702 m).   Weight as of this encounter: 174 lb 6.4 oz (79.1 kg). BP 120/78   Pulse 67   Temp 97.7 F (36.5 C)   Ht '5\' 7"'  (1.702 m)   Wt 174 lb 6.4 oz (79.1 kg)   SpO2 96%   BMI 27.31 kg/m  General Appearance: Well nourished, in no apparent distress.  Eyes: PERRLA, EOMs, conjunctiva no swelling or erythema Sinuses: No Frontal/maxillary tenderness  ENT/Mouth: Ext aud canals clear, normal light reflex with TMs without erythema, bulging. Good dentition. No erythema, swelling, or exudate on post pharynx. Tonsils not swollen or erythematous. Hearing normal.  Neck: Supple, thyroid normal. No bruits  Respiratory: Respiratory effort normal, BS equal bilaterally without rales, rhonchi, wheezing or stridor.  Cardio: RRR without murmurs, rubs or gallops. Brisk peripheral pulses without edema.  Chest:  symmetric, with normal excursions and percussion.  Breasts: defer, no concerns, getting mammograms Abdomen: Soft, nontender, no guarding, rebound, hernias, masses, or organomegaly.  Lymphatics: Non tender without lymphadenopathy.  Genitourinary: defer Musculoskeletal: Full ROM all peripheral extremities,5/5 strength, and normal gait.  Skin: Warm, dry without rashes, lesions, ecchymosis. Neuro: Cranial nerves intact, reflexes equal bilaterally. Normal muscle tone, no cerebellar symptoms. Sensation intact.  Psych: Awake and oriented X 3, normal affect, Insight and Judgment appropriate.   EKG:  NSR with Prolonged QT  Dickie Cloe Mikki Santee, NP 3:30 PM [facility]

## 2021-09-20 ENCOUNTER — Encounter: Payer: Self-pay | Admitting: Nurse Practitioner

## 2021-09-20 ENCOUNTER — Other Ambulatory Visit: Payer: Self-pay

## 2021-09-20 ENCOUNTER — Ambulatory Visit (INDEPENDENT_AMBULATORY_CARE_PROVIDER_SITE_OTHER): Payer: BC Managed Care – PPO | Admitting: Nurse Practitioner

## 2021-09-20 VITALS — BP 120/78 | HR 67 | Temp 97.7°F | Ht 67.0 in | Wt 174.4 lb

## 2021-09-20 DIAGNOSIS — M797 Fibromyalgia: Secondary | ICD-10-CM

## 2021-09-20 DIAGNOSIS — Z13220 Encounter for screening for lipoid disorders: Secondary | ICD-10-CM | POA: Diagnosis not present

## 2021-09-20 DIAGNOSIS — Z130 Encounter for screening for diseases of the blood and blood-forming organs and certain disorders involving the immune mechanism: Secondary | ICD-10-CM

## 2021-09-20 DIAGNOSIS — Z1389 Encounter for screening for other disorder: Secondary | ICD-10-CM | POA: Diagnosis not present

## 2021-09-20 DIAGNOSIS — Z0001 Encounter for general adult medical examination with abnormal findings: Secondary | ICD-10-CM

## 2021-09-20 DIAGNOSIS — E559 Vitamin D deficiency, unspecified: Secondary | ICD-10-CM | POA: Diagnosis not present

## 2021-09-20 DIAGNOSIS — Z136 Encounter for screening for cardiovascular disorders: Secondary | ICD-10-CM | POA: Diagnosis not present

## 2021-09-20 DIAGNOSIS — Z79899 Other long term (current) drug therapy: Secondary | ICD-10-CM

## 2021-09-20 DIAGNOSIS — Z720 Tobacco use: Secondary | ICD-10-CM

## 2021-09-20 DIAGNOSIS — F411 Generalized anxiety disorder: Secondary | ICD-10-CM

## 2021-09-20 DIAGNOSIS — I10 Essential (primary) hypertension: Secondary | ICD-10-CM

## 2021-09-20 DIAGNOSIS — E785 Hyperlipidemia, unspecified: Secondary | ICD-10-CM

## 2021-09-20 DIAGNOSIS — Z0000 Encounter for general adult medical examination without abnormal findings: Secondary | ICD-10-CM | POA: Diagnosis not present

## 2021-09-20 DIAGNOSIS — R9431 Abnormal electrocardiogram [ECG] [EKG]: Secondary | ICD-10-CM

## 2021-09-20 DIAGNOSIS — Z131 Encounter for screening for diabetes mellitus: Secondary | ICD-10-CM | POA: Diagnosis not present

## 2021-09-20 DIAGNOSIS — Z1329 Encounter for screening for other suspected endocrine disorder: Secondary | ICD-10-CM

## 2021-09-20 DIAGNOSIS — G894 Chronic pain syndrome: Secondary | ICD-10-CM

## 2021-09-20 DIAGNOSIS — Z9884 Bariatric surgery status: Secondary | ICD-10-CM

## 2021-09-20 DIAGNOSIS — Z79891 Long term (current) use of opiate analgesic: Secondary | ICD-10-CM

## 2021-09-20 DIAGNOSIS — R7309 Other abnormal glucose: Secondary | ICD-10-CM

## 2021-09-20 DIAGNOSIS — E663 Overweight: Secondary | ICD-10-CM

## 2021-09-20 DIAGNOSIS — G8929 Other chronic pain: Secondary | ICD-10-CM

## 2021-09-20 MED ORDER — GABAPENTIN 600 MG PO TABS: ORAL_TABLET | ORAL | 1 refills | Status: DC

## 2021-09-20 NOTE — Patient Instructions (Signed)

## 2021-09-21 ENCOUNTER — Encounter: Payer: Self-pay | Admitting: Nurse Practitioner

## 2021-09-21 ENCOUNTER — Ambulatory Visit (INDEPENDENT_AMBULATORY_CARE_PROVIDER_SITE_OTHER): Payer: BC Managed Care – PPO | Admitting: Nurse Practitioner

## 2021-09-21 VITALS — BP 110/74 | HR 82 | Ht 67.0 in | Wt 173.0 lb

## 2021-09-21 DIAGNOSIS — K219 Gastro-esophageal reflux disease without esophagitis: Secondary | ICD-10-CM | POA: Diagnosis not present

## 2021-09-21 DIAGNOSIS — K5900 Constipation, unspecified: Secondary | ICD-10-CM | POA: Diagnosis not present

## 2021-09-21 LAB — VITAMIN B12: Vitamin B-12: 577 pg/mL (ref 200–1100)

## 2021-09-21 LAB — COMPLETE METABOLIC PANEL WITH GFR
AG Ratio: 1.7 (calc) (ref 1.0–2.5)
ALT: 12 U/L (ref 6–29)
AST: 17 U/L (ref 10–35)
Albumin: 4.3 g/dL (ref 3.6–5.1)
Alkaline phosphatase (APISO): 149 U/L (ref 37–153)
BUN: 15 mg/dL (ref 7–25)
CO2: 29 mmol/L (ref 20–32)
Calcium: 9.4 mg/dL (ref 8.6–10.4)
Chloride: 102 mmol/L (ref 98–110)
Creat: 0.77 mg/dL (ref 0.50–1.03)
Globulin: 2.6 g/dL (calc) (ref 1.9–3.7)
Glucose, Bld: 82 mg/dL (ref 65–99)
Potassium: 3.9 mmol/L (ref 3.5–5.3)
Sodium: 140 mmol/L (ref 135–146)
Total Bilirubin: 0.3 mg/dL (ref 0.2–1.2)
Total Protein: 6.9 g/dL (ref 6.1–8.1)
eGFR: 91 mL/min/{1.73_m2} (ref >=60)

## 2021-09-21 LAB — IRON, TOTAL/TOTAL IRON BINDING CAP
%SAT: 15 % (calc) — ABNORMAL LOW (ref 16–45)
Iron: 62 ug/dL (ref 45–160)
TIBC: 404 mcg/dL (calc) (ref 250–450)

## 2021-09-21 LAB — URINALYSIS, ROUTINE W REFLEX MICROSCOPIC
Bacteria, UA: NONE SEEN /HPF
Bilirubin Urine: NEGATIVE
Glucose, UA: NEGATIVE
Hgb urine dipstick: NEGATIVE
Hyaline Cast: NONE SEEN /LPF
Nitrite: NEGATIVE
Protein, ur: NEGATIVE
RBC / HPF: NONE SEEN /HPF (ref 0–2)
Specific Gravity, Urine: 1.023 (ref 1.001–1.035)
Squamous Epithelial / HPF: NONE SEEN /HPF (ref <=5)
WBC, UA: NONE SEEN /HPF (ref 0–5)
pH: 7.5 (ref 5.0–8.0)

## 2021-09-21 LAB — VITAMIN D 25 HYDROXY (VIT D DEFICIENCY, FRACTURES): Vit D, 25-Hydroxy: 44 ng/mL (ref 30–100)

## 2021-09-21 LAB — HEMOGLOBIN A1C
Hgb A1c MFr Bld: 5.4 % of total Hgb (ref <5.7)
Mean Plasma Glucose: 108 mg/dL
eAG (mmol/L): 6 mmol/L

## 2021-09-21 LAB — LIPID PANEL
Cholesterol: 160 mg/dL (ref <200)
HDL: 71 mg/dL (ref >=50)
LDL Cholesterol (Calc): 72 mg/dL (calc)
Non-HDL Cholesterol (Calc): 89 mg/dL (calc) (ref <130)
Total CHOL/HDL Ratio: 2.3 (calc) (ref <5.0)
Triglycerides: 87 mg/dL (ref <150)

## 2021-09-21 LAB — CBC WITH DIFFERENTIAL/PLATELET
Absolute Monocytes: 490 cells/uL (ref 200–950)
Basophils Absolute: 29 cells/uL (ref 0–200)
Basophils Relative: 0.6 %
Eosinophils Absolute: 101 cells/uL (ref 15–500)
Eosinophils Relative: 2.1 %
HCT: 38.3 % (ref 35.0–45.0)
Hemoglobin: 12.8 g/dL (ref 11.7–15.5)
Lymphs Abs: 1474 cells/uL (ref 850–3900)
MCH: 29.8 pg (ref 27.0–33.0)
MCHC: 33.4 g/dL (ref 32.0–36.0)
MCV: 89.1 fL (ref 80.0–100.0)
MPV: 9.6 fL (ref 7.5–12.5)
Monocytes Relative: 10.2 %
Neutro Abs: 2707 cells/uL (ref 1500–7800)
Neutrophils Relative %: 56.4 %
Platelets: 315 10*3/uL (ref 140–400)
RBC: 4.3 10*6/uL (ref 3.80–5.10)
RDW: 13 % (ref 11.0–15.0)
Total Lymphocyte: 30.7 %
WBC: 4.8 10*3/uL (ref 3.8–10.8)

## 2021-09-21 LAB — MICROALBUMIN / CREATININE URINE RATIO
Creatinine, Urine: 120 mg/dL (ref 20–275)
Microalb Creat Ratio: 6 mcg/mg creat (ref <30)
Microalb, Ur: 0.7 mg/dL

## 2021-09-21 LAB — TSH: TSH: 0.55 mIU/L

## 2021-09-21 LAB — FERRITIN: Ferritin: 7 ng/mL — ABNORMAL LOW (ref 16–232)

## 2021-09-21 LAB — MAGNESIUM: Magnesium: 2.4 mg/dL (ref 1.5–2.5)

## 2021-09-21 MED ORDER — OMEPRAZOLE 20 MG PO CPDR: 20.0000 mg | DELAYED_RELEASE_CAPSULE | Freq: Every day | ORAL | 1 refills | Status: DC

## 2021-09-21 MED ORDER — LUBIPROSTONE 24 MCG PO CAPS: 24.0000 ug | ORAL_CAPSULE | Freq: Two times a day (BID) | ORAL | 3 refills | Status: DC

## 2021-09-21 NOTE — Progress Notes (Signed)
09/21/2021 Calvina Weddington Nevin [id number] 10/02/1965   CHIEF COMPLAINT: Constipation   HISTORY OF PRESENT ILLNESS: Lattie Haw A. Hudon is a 56 year old female with a past medical history of anxiety, fibromyalgia, hypertension, chronic iron deficiency anemia, GERD and constipation. S/p cholecystectomy and Roux en-y surgery in 2013. Past tummy tuck surgery. She is followed by Dr. Fuller Plan. She presents today with complaints of having constipation, strains, has difficulty pushing stool out. She inserts her fingers inside her vagina and pushes towards the rectum to facilitate moving stool out. She sometimes has difficulty voiding, leans forward when sitting on the commode to empty the bladder. She takes Magnesium 500mg  one tab twice during the day and 1000mg  at bedtime which results in passing a soft stool. No diarrhea. She used Amitza in the past which controlled her constipation fairly well. No rectal bleeding or  black stools.  She underwent colonoscopy 04/2017 which was normal, see further details below.  She is having heartburn a few days weekly for the past 1 1/2 months. No dysphagia.  She underwent an EGD 06/27/2010 2002 which was normal. S/P Roux en-y gastric bypass surgery in 2013. History of IDA on oral iron. Labs 09/20/2021 showed a Hg level of 12.8. Iron 62. Iron saturation 15%. Ferritin 7. No rectal bleeding or black stools.   Colonoscopy 05/13/2017: The quality of the bowel preparation was adequate to identify polyps 6 mm and larger in size despite extensive lavage and suctioning. Left colon prep was better than right colon prep - The entire examined colon is normal on direct and retroflexion views. - No specimens collected. Impression: - Repeat colonoscopy in 5 years for screening purposes with an extended bowel prep.  EGD 06/26/2000: Normal      Latest Ref Rng & Units 09/20/2021    4:31 PM 06/05/2021    4:35 PM 03/09/2021    4:36 PM  CBC  WBC 3.8 - 10.8 Thousand/uL 4.8  4.1  4.5    Hemoglobin 11.7 - 15.5 g/dL 12.8  13.2  12.6   Hematocrit 35.0 - 45.0 % 38.3  38.8  38.0   Platelets 140 - 400 Thousand/uL 315  322  327    Iron 62. Iron saturation 15%. Ferritin 7. B12 level 577.      Latest Ref Rng & Units 09/20/2021    4:31 PM 06/05/2021    4:35 PM 03/09/2021    4:36 PM  CMP  Glucose 65 - 99 mg/dL 82  87  85   BUN 7 - 25 mg/dL 15  14  14    Creatinine 0.50 - 1.03 mg/dL 0.77  0.76  0.78   Sodium 135 - 146 mmol/L 140  141  143   Potassium 3.5 - 5.3 mmol/L 3.9  4.1  4.1   Chloride 98 - 110 mmol/L 102  105  106   CO2 20 - 32 mmol/L 29  28  29    Calcium 8.6 - 10.4 mg/dL 9.4  9.4  9.6   Total Protein 6.1 - 8.1 g/dL 6.9  6.8  6.7   Total Bilirubin 0.2 - 1.2 mg/dL 0.3  0.3  0.4   AST 10 - 35 U/L 17  15  12    ALT 6 - 29 U/L 12  13  10     TSH 0.55.  Past Medical History:  Diagnosis Date   Allergy    Anemia    Anxiety    Arthritis    Fibromyalgia    GERD (gastroesophageal reflux disease)  Hypertension    IBS (irritable bowel syndrome)    Nephrolithiasis    Plantar fasciitis of right foot    wears boot    Past Surgical History:  Procedure Laterality Date   BREATH TEK H PYLORI  07/11/2011   Procedure: BREATH TEK H PYLORI;  Surgeon: Mariella Saa, MD;  Location: [facility];  Service: General;  Laterality: N/A;   CHOLECYSTECTOMY  10/01/2011   Procedure: LAPAROSCOPIC CHOLECYSTECTOMY WITH INTRAOPERATIVE CHOLANGIOGRAM;  Surgeon: Mariella Saa, MD;  Location: [facility];  Service: General;  Laterality: N/A;   COLONOSCOPY     GASTRIC ROUX-EN-Y  10/01/2011   Procedure: LAPAROSCOPIC ROUX-EN-Y GASTRIC;  Surgeon: Mariella Saa, MD;  Location: [facility];  Service: General;  Laterality: N/A;   Left shoulder arthroscopic surgery Left    bursectomy, in her early 7s   tummy tuck  2005   Social History: Married. She has one son age 66. She smokes 1 pack of cigarettes per week off and on x 15 to 20 years. She drinks 2 to 4 ounces of red wine daily. No drug use.    Family History: family history includes Breast cancer (age of onset: 54) in her mother; Breast cancer (age of onset: 40) in her maternal aunt; Cancer in her maternal grandmother; Cancer (age of onset: 24) in her mother; Heart attack in her maternal grandfather and paternal grandfather; Heart disease in her father and maternal grandfather; Hypertension in her father; Lung cancer in her maternal grandmother; Multiple sclerosis in her maternal uncle; Obesity in her brother; Stroke in her father.  No Known Allergies    Outpatient Encounter Medications as of 09/21/2021  Medication Sig   Albuterol Sulfate (PROVENTIL HFA IN) Inhale 108 mcg into the lungs as needed.   Aspirin-Acetaminophen-Caffeine (EXCEDRIN EXTRA STRENGTH PO) Take 1 tablet by mouth 3 (three) times daily as needed (headache).    betamethasone dipropionate (DIPROLENE) 0.05 % ointment Apply topically 2 (two) times daily.   cetirizine-pseudoephedrine (ZYRTEC-D) 5-120 MG tablet Take 1 tablet by mouth every 12 (twelve) hours as needed for allergies.    diphenhydrAMINE (BENADRYL) 25 MG tablet Take 25-50 mg by mouth 3 (three) times daily as needed for allergies.   DULoxetine (CYMBALTA) 30 MG capsule TAKE 1 CAPSULE DAILY WITH 60 MG TABLET TO EQUAL 90 MG DAILY   DULoxetine (CYMBALTA) 60 MG capsule TAKE 1 CAPSULE DAILY WITH 30 MG TABLET TO EQUAL 90 MG   gabapentin (NEURONTIN) 600 MG tablet TAKE ONE TABLET BY MOUTH THREE TIMES A DAY AND TAKE 1 AND 1/2 TABLET AT BEDTIME   Glucosamine-Chondroitin (COSAMIN DS PO) Take 1,500 mg by mouth daily.   Iron-FA-B Cmp-C-Biot-Probiotic (FUSION PLUS) CAPS Take 1 cap every 2-3 days as tolerated for chronic iron deficiency.   losartan-hydrochlorothiazide (HYZAAR) 100-25 MG tablet Take 1 tablet by mouth daily.   Multiple Vitamins-Minerals (MULTIVITAMIN WITH MINERALS) tablet Take 1 tablet by mouth daily. .   QUEtiapine (SEROQUEL) 25 MG tablet Take three tablets by mouth every night at bedtime   Semaglutide-Weight  Management (WEGOVY) 2.4 MG/0.75ML SOAJ INJECT 2.4 MG UNDER THE SKIN ONCE A WEEK   traMADol (ULTRAM) 50 MG tablet Take 1 tablet (50 mg total) by mouth every 6 (six) hours as needed for severe pain. Each refill must last 30 days.   valACYclovir (VALTREX) 500 MG tablet TAKE ONE TABLET BY MOUTH DAILY AS NEEDED FOR COLD SORES   lactulose (CHRONULAC) 10 GM/15ML solution TAKE BY MOUTH TWO TIMES A DAY (Patient not taking: Reported on  09/20/2021)   NOVOFINE 32G X 6 MM MISC Needs daily for victoza (Patient not taking: Reported on 09/21/2021)   Vitamin D, Ergocalciferol, (DRISDOL) 1.25 MG (50000 UNIT) CAPS capsule TAKE 1 CAPSULE TWO TIMES A WEEK (Patient not taking: Reported on 09/21/2021)   No facility-administered encounter medications on file as of 09/21/2021.    REVIEW OF SYSTEMS:  Gen: Denies fever, sweats or chills. No weight loss.  CV: Denies chest pain, palpitations or edema. Resp: Denies cough, shortness of breath of hemoptysis.  GI:See HPI.  GU : Denies urinary burning, blood in urine, increased urinary frequency or incontinence. MS: Denies joint pain, muscles aches or weakness. Derm: Denies rash, itchiness, skin lesions or unhealing ulcers. Psych: + Anxiety.  Heme: Denies bruising, easy bleeding. Neuro:  Denies headaches, dizziness or paresthesias. Endo:  Denies any problems with DM, thyroid or adrenal function.  PHYSICAL EXAM: BP 110/74   Pulse 82   Ht 5\' 7"  (1.702 m)   Wt 173 lb (78.5 kg)   BMI 27.10 kg/m  Wt Readings from Last 3 Encounters:  09/21/21 173 lb (78.5 kg)  09/20/21 174 lb 6.4 oz (79.1 kg)  08/28/21 170 lb (77.1 kg)    General: 56 year old female in no acute distress. Head: Normocephalic and atraumatic. Eyes:  Sclerae non-icteric, conjunctive pink. Ears: Normal auditory acuity. Mouth: Dentition intact. No ulcers or lesions.  Neck: Supple, no lymphadenopathy or thyromegaly.  Lungs: Clear bilaterally to auscultation without wheezes, crackles or rhonchi. Heart:  Regular rate and rhythm. No murmur, rub or gallop appreciated.  Abdomen: Soft, nontender, non distended. No masses. No hepatosplenomegaly. Normoactive bowel sounds x 4 quadrants.  RUQ and central lower abdominal scar intact. Rectal: Deferred. Musculoskeletal: Symmetrical with no gross deformities. Skin: Warm and dry. No rash or lesions on visible extremities. Extremities: No edema. Neurological: Alert oriented x 4, no focal deficits.  Psychological:  Alert and cooperative. Normal mood and affect.  ASSESSMENT AND PLAN:  93) 56 year old female with constipation. Altered bowel pattern, suspect rectocele and cystocele. -Recommended gyn consult to assess for rectocele and cystocele  -Amitiza 53 one po bid -Benefiber 1 tablespoon daily as tolerated -Patient did not wish to pursue pelvic floor physical therapy -Contact office if symptoms persist or worsen -Follow up in 2 months  2) Colon cancer screening. Colonoscopy 04/2017, no polyps, prep was inadequate to adequately assess for < 51mm -Next colonoscopy due 4/ 2024  3) GERD, history of Roux en-y gastric bypass surgery. Normal EGD in 2002. Intermittent heartburn symptoms.  -GERD diet reviewed -Omeprazole 20 mg 1 capsule to be taken 30 minutes before breakfast  4) Chronic IDA on oral iron. Labs 09/20/2021 showed a Hg level of 12.8. Iron 62. Iron saturation 15%. Ferritin 7. No overt GI bleeding.  -Consider EGD at time of colonoscopy 04/2022, to discuss further with Dr. 05/2022      CC:  Russella Dar, MD

## 2021-09-21 NOTE — Patient Instructions (Addendum)
We have sent the following medications to your pharmacy for you to pick up at your convenience: Amitiza 24 mcg twice daily. Omeprazole 20 mg daily 30-60 minutes before breakfast.  Start Benefiber 1 teaspoons in 8 ounces of liquid daily.  _______________________________________________________  If you are age 55 or older, your body mass index should be between 23-30. Your Body mass index is 27.1 kg/m. If this is out of the aforementioned range listed, please consider follow up with your Primary Care Provider.  If you are age 28 or younger, your body mass index should be between 19-25. Your Body mass index is 27.1 kg/m. If this is out of the aformentioned range listed, please consider follow up with your Primary Care Provider.   ________________________________________________________  The [facility] providers would like to encourage you to use [facility] to communicate with providers for non-urgent requests or questions.  Due to long hold times on the telephone, sending your provider a message by [facility] may be a faster and more efficient way to get a response.  Please allow 48 business hours for a response.  Please remember that this is for non-urgent requests.  _______________________________________________________

## 2021-09-22 ENCOUNTER — Encounter: Payer: Self-pay | Admitting: Nurse Practitioner

## 2021-10-02 NOTE — Progress Notes (Unsigned)
Cardiology Office Note:    Date:  10/03/2021   ID:  Wendy Valencia, DOB 1965-09-13, MRN [id number]  PCP:  Lucky Cowboy, MD  Cardiologist:  None   Referring MD: Raynelle Dick, NP   No chief complaint on file.   History of Present Illness:    Wendy Valencia is a 56 y.o. female with a hx of hypertension, hyperlipidemia, pre-diabetes, chronic pain syndrome on long-term opiate therapy, tobacco use,  who is referred for ECG with ? prolonged QT.  The EKG performed on 09/20/2021 demonstrated flattened T waves with possible U wave and was concerning for prolonged QT.  EKG was repeated today and is captured below demonstrating nonspecific T wave flattening with QTc of 414 ms which is within the normal range.  She does have repolarization abnormality likely related to many of the medications that she now takes.  She has no specific cardiac complaints.  Past Medical History:  Diagnosis Date   Allergy    Anemia    Anxiety    Arthritis    Fibromyalgia    GERD (gastroesophageal reflux disease)    Hypertension    IBS (irritable bowel syndrome)    Nephrolithiasis    Plantar fasciitis of right foot    wears boot     Past Surgical History:  Procedure Laterality Date   BREATH TEK H PYLORI  07/11/2011   Procedure: BREATH TEK H PYLORI;  Surgeon: Mariella Saa, MD;  Location: [facility];  Service: General;  Laterality: N/A;   CHOLECYSTECTOMY  10/01/2011   Procedure: LAPAROSCOPIC CHOLECYSTECTOMY WITH INTRAOPERATIVE CHOLANGIOGRAM;  Surgeon: Mariella Saa, MD;  Location: [facility];  Service: General;  Laterality: N/A;   COLONOSCOPY     GASTRIC ROUX-EN-Y  10/01/2011   Procedure: LAPAROSCOPIC ROUX-EN-Y GASTRIC;  Surgeon: Mariella Saa, MD;  Location: [facility];  Service: General;  Laterality: N/A;   Left shoulder arthroscopic surgery Left    bursectomy, in her early 11s   tummy tuck  2005    Current Medications: Current Meds  Medication Sig   Albuterol Sulfate  (PROVENTIL HFA IN) Inhale 108 mcg into the lungs as needed.   Aspirin-Acetaminophen-Caffeine (EXCEDRIN EXTRA STRENGTH PO) Take 1 tablet by mouth 3 (three) times daily as needed (headache).    betamethasone dipropionate (DIPROLENE) 0.05 % ointment Apply topically 2 (two) times daily.   cetirizine-pseudoephedrine (ZYRTEC-D) 5-120 MG tablet Take 1 tablet by mouth every 12 (twelve) hours as needed for allergies.    diphenhydrAMINE (BENADRYL) 25 MG tablet Take 25-50 mg by mouth 3 (three) times daily as needed for allergies.   DULoxetine (CYMBALTA) 30 MG capsule TAKE 1 CAPSULE DAILY WITH 60 MG TABLET TO EQUAL 90 MG DAILY   DULoxetine (CYMBALTA) 60 MG capsule TAKE 1 CAPSULE DAILY WITH 30 MG TABLET TO EQUAL 90 MG   gabapentin (NEURONTIN) 600 MG tablet TAKE ONE TABLET BY MOUTH THREE TIMES A DAY AND TAKE 1 AND 1/2 TABLET AT BEDTIME   Glucosamine-Chondroitin (COSAMIN DS PO) Take 1,500 mg by mouth daily.   Iron-FA-B Cmp-C-Biot-Probiotic (FUSION PLUS) CAPS Take 1 cap every 2-3 days as tolerated for chronic iron deficiency.   lactulose (CHRONULAC) 10 GM/15ML solution TAKE BY MOUTH TWO TIMES A DAY   losartan-hydrochlorothiazide (HYZAAR) 100-25 MG tablet Take 1 tablet by mouth daily.   lubiprostone (AMITIZA) 24 MCG capsule Take 1 capsule (24 mcg total) by mouth 2 (two) times daily with a meal.   Multiple Vitamins-Minerals (MULTIVITAMIN WITH MINERALS) tablet Take 1 tablet by  mouth daily. Marland Kitchen   NOVOFINE 32G X 6 MM MISC Needs daily for victoza   omeprazole (PRILOSEC) 20 MG capsule Take 1 capsule (20 mg total) by mouth daily.   QUEtiapine (SEROQUEL) 25 MG tablet Take three tablets by mouth every night at bedtime   Semaglutide-Weight Management (WEGOVY) 2.4 MG/0.75ML SOAJ INJECT 2.4 MG UNDER THE SKIN ONCE A WEEK   traMADol (ULTRAM) 50 MG tablet Take 1 tablet (50 mg total) by mouth every 6 (six) hours as needed for severe pain. Each refill must last 30 days.   valACYclovir (VALTREX) 500 MG tablet TAKE ONE TABLET BY  MOUTH DAILY AS NEEDED FOR COLD SORES   Vitamin D, Ergocalciferol, (DRISDOL) 1.25 MG (50000 UNIT) CAPS capsule TAKE 1 CAPSULE TWO TIMES A WEEK     Allergies:   Patient has no known allergies.   Social History   Socioeconomic History   Marital status: Married    Spouse name: Not on file   Number of children: 1   Years of education: Not on file   Highest education level: Not on file  Occupational History    Comment: Scientist, water quality for [employer]  Tobacco Use   Smoking status: Every Day    Packs/day: 0.15    Years: 31.00    Total pack years: 4.65    Types: Cigarettes    Start date: 71   Smokeless tobacco: Never  Vaping Use   Vaping Use: Never used  Substance and Sexual Activity   Alcohol use: Yes    Alcohol/week: 1.0 standard drink of alcohol    Types: 1 Glasses of wine per week    Comment: 1x week   Drug use: No   Sexual activity: Yes    Partners: Male    Birth control/protection: Post-menopausal    Comment: 1st intercourse- 18, partners-?, married-  Other Topics Concern   Not on file  Social History Narrative   Not on file   Social Determinants of Health   Financial Resource Strain: Not on file  Food Insecurity: Not on file  Transportation Needs: Not on file  Physical Activity: Not on file  Stress: Not on file  Social Connections: Not on file     Family History: The patient's family history includes Breast cancer (age of onset: 14) in her mother; Breast cancer (age of onset: 61) in her maternal aunt; Cancer in her maternal grandmother; Cancer (age of onset: 14) in her mother; Heart attack in her maternal grandfather and paternal grandfather; Heart disease in her father and maternal grandfather; Hypertension in her father; Lung cancer in her maternal grandmother; Multiple sclerosis in her maternal uncle; Obesity in her brother; Stroke in her father. There is no history of Colon cancer, Colon polyps, Esophageal cancer, or Rectal cancer.  ROS:   Please see the  history of present illness.    Musculoskeletal discomfort.  Some difficulty sleeping.  Seroquel has been very helpful with sleep.  All other systems reviewed and are negative.  EKGs/Labs/Other Studies Reviewed:    The following studies were reviewed today: No cardiac data  EKG:  EKG 09/20/2021 revealing diffuse TW flattening.  EKG performed today demonstrates normal sinus rhythm, borderline short PR interval, nonspecific T wave flattening, but no evidence of QT prolongation.  Recent Labs: 09/20/2021: ALT 12; BUN 15; Creat 0.77; Hemoglobin 12.8; Magnesium 2.4; Platelets 315; Potassium 3.9; Sodium 140; TSH 0.55  Recent Lipid Panel    Component Value Date/Time   CHOL 160 09/20/2021 1631   TRIG 87 09/20/2021  1631   HDL 71 09/20/2021 1631   CHOLHDL 2.3 09/20/2021 1631   VLDL 10 03/05/2016 1720   LDLCALC 72 09/20/2021 1631    Physical Exam:    VS:  BP 134/78   Pulse 77   Ht 5\' 7"  (1.702 m)   Wt 176 lb (79.8 kg)   SpO2 97%   BMI 27.57 kg/m     Wt Readings from Last 3 Encounters:  10/03/21 176 lb (79.8 kg)  09/21/21 173 lb (78.5 kg)  09/20/21 174 lb 6.4 oz (79.1 kg)     GEN: Healthy appearing.. No acute distress HEENT: Normal NECK: No JVD. LYMPHATICS: No lymphadenopathy CARDIAC: No murmur. RRR no gallop, or edema. VASCULAR:  Normal Pulses. No bruits. RESPIRATORY:  Clear to auscultation without rales, wheezing or rhonchi  ABDOMEN: Soft, non-tender, non-distended, No pulsatile mass, MUSCULOSKELETAL: No deformity  SKIN: Warm and dry NEUROLOGIC:  Alert and oriented x 3 PSYCHIATRIC:  Normal affect   ASSESSMENT:    1. Prolonged Q-T interval on ECG   2. Tobacco abuse   3. Hyperlipidemia, unspecified hyperlipidemia type   4. Essential hypertension    PLAN:    In order of problems listed above:  EKG repeated today does not reveal any evidence of QT prolongation although there are flattened T waves.  The repolarization abnormality is likely related to her medication regimen.   Tramadol, Zyrtec-D, albuterol, and Seroquel can all prolong the QT.  Consideration of medication adjustment will be per the prescribing physician and patient doing a shared conversation concerning the potential risk. Encouraged cessation Did not discuss Blood pressure control today is excellent.   Medication Adjustments/Labs and Tests Ordered: Current medicines are reviewed at length with the patient today.  Concerns regarding medicines are outlined above.  Orders Placed This Encounter  Procedures   EKG 12-Lead   No orders of the defined types were placed in this encounter.   Patient Instructions  Medication Instructions:  Your physician recommends that you continue on your current medications as directed. Please refer to the Current Medication list given to you today.  *If you need a refill on your cardiac medications before your next appointment, please call your pharmacy*  Lab Work: NONE  Testing/Procedures: NONE  Follow-Up: As needed  Important Information About Sugar         Signed, Sinclair Grooms, MD  10/03/2021 4:22 PM    [place]

## 2021-10-03 ENCOUNTER — Ambulatory Visit: Payer: BC Managed Care – PPO | Attending: Interventional Cardiology | Admitting: Interventional Cardiology

## 2021-10-03 ENCOUNTER — Encounter: Payer: Self-pay | Admitting: Interventional Cardiology

## 2021-10-03 VITALS — BP 134/78 | HR 77 | Ht 67.0 in | Wt 176.0 lb

## 2021-10-03 DIAGNOSIS — I10 Essential (primary) hypertension: Secondary | ICD-10-CM | POA: Diagnosis not present

## 2021-10-03 DIAGNOSIS — R9431 Abnormal electrocardiogram [ECG] [EKG]: Secondary | ICD-10-CM

## 2021-10-03 DIAGNOSIS — E785 Hyperlipidemia, unspecified: Secondary | ICD-10-CM | POA: Diagnosis not present

## 2021-10-03 DIAGNOSIS — Z720 Tobacco use: Secondary | ICD-10-CM

## 2021-10-03 NOTE — Patient Instructions (Signed)
Medication Instructions:  Your physician recommends that you continue on your current medications as directed. Please refer to the Current Medication list given to you today.  *If you need a refill on your cardiac medications before your next appointment, please call your pharmacy*  Lab Work: NONE  Testing/Procedures: NONE  Follow-Up: As needed  Important Information About Sugar       

## 2021-10-18 NOTE — Progress Notes (Signed)
Future Appointments  Date Time Provider Department  10/19/2021  4:00 PM Unk Pinto, MD [facility]  11/01/2021 11:00 AM Nunzio Cobbs, MD [facility]  11/22/2021  8:30 AM Ladene Artist, MD [facility]  02/19/2022  9:40 AM Milinda Pointer, MD [place]  03/29/2022  4:00 PM Alycia Rossetti, NP [facility]  09/24/2022  3:00 PM Alycia Rossetti, NP [facility]    History of Present Illness:      Patient is a very nice 56 yo MBF with HTN, HLD, Fibromyalgia /Chronic Pain Syndrome & GERD who presents for f/u. Systems review is negative . Patient also has concerns re: laceration of her Rt ear lobule consequent of  traction tearing causing significant cosmetic deformity. Patient expresses desire to have cosmetic repair.    Medications   Current Outpatient Medications (Cardiovascular):    losartan-hydrochlorothiazide (HYZAAR) 100-25 MG tablet, Take 1 tablet by mouth daily.  Current Outpatient Medications (Respiratory):    Albuterol Sulfate (PROVENTIL HFA IN), Inhale 108 mcg into the lungs as needed.   cetirizine-pseudoephedrine (ZYRTEC-D) 5-120 MG tablet, Take 1 tablet by mouth every 12 (twelve) hours as needed for allergies.    diphenhydrAMINE (BENADRYL) 25 MG tablet, Take 25-50 mg by mouth 3 (three) times daily as needed for allergies.  Current Outpatient Medications (Analgesics):    Aspirin-Acetaminophen-Caffeine (EXCEDRIN EXTRA STRENGTH PO), Take 1 tablet by mouth 3 (three) times daily as needed (headache).    traMADol (ULTRAM) 50 MG tablet, Take 1 tablet (50 mg total) by mouth every 6 (six) hours as needed for severe pain. Each refill must last 30 days.  Current Outpatient Medications (Hematological):    Iron-FA-B Cmp-C-Biot-Probiotic (FUSION PLUS) CAPS, Take 1 cap every 2-3 days as tolerated for chronic iron deficiency.  Current Outpatient Medications (Other):    betamethasone dipropionate (DIPROLENE) 0.05 % ointment, Apply topically 2 (two) times daily.   DULoxetine  (CYMBALTA) 30 MG capsule, TAKE 1 CAPSULE DAILY WITH 60 MG TABLET TO EQUAL 90 MG DAILY   DULoxetine (CYMBALTA) 60 MG capsule, TAKE 1 CAPSULE DAILY WITH 30 MG TABLET TO EQUAL 90 MG   gabapentin (NEURONTIN) 600 MG tablet, TAKE ONE TABLET BY MOUTH THREE TIMES A DAY AND TAKE 1 AND 1/2 TABLET AT BEDTIME   Glucosamine-Chondroitin (COSAMIN DS PO), Take 1,500 mg by mouth daily.   lactulose (CHRONULAC) 10 GM/15ML solution, TAKE 30MLS BY MOUTH TWO TIMES A DAY   lubiprostone (AMITIZA) 24 MCG capsule, Take 1 capsule (24 mcg total) by mouth 2 (two) times daily with a meal.   Multiple Vitamins-Minerals (MULTIVITAMIN WITH MINERALS) tablet, Take 1 tablet by mouth daily. Marland Kitchen   NOVOFINE 32G X 6 MM MISC, Needs daily for victoza   omeprazole (PRILOSEC) 20 MG capsule, Take 1 capsule (20 mg total) by mouth daily.   QUEtiapine (SEROQUEL) 25 MG tablet, Take three tablets by mouth every night at bedtime   Semaglutide-Weight Management (WEGOVY) 2.4 MG/0.75ML SOAJ, INJECT 2.4 MG UNDER THE SKIN ONCE A WEEK   valACYclovir (VALTREX) 500 MG tablet, TAKE ONE TABLET BY MOUTH DAILY AS NEEDED FOR COLD SORES   Vitamin D, Ergocalciferol, (DRISDOL) 1.25 MG (50000 UNIT) CAPS capsule, TAKE 1 CAPSULE TWO TIMES A WEEK  Problem list She has Anxiety state; Essential hypertension; Tobacco abuse; Medication management; Vitamin D deficiency; S/P gastric bypass; Cervical spondylosis without myelopathy; Fibromyalgia affecting multiple sites (2ry area of Pain); Spondylosis of lumbar spine; Chronic generalized pain (1ry area of Pain); Chronic pain syndrome; Long term current use of opiate analgesic; Chronic  shoulder pain (3ry area of Pain) (Bilateral) (R>L); Chronic hip pain (4th area of Pain) (Bilateral) (R>L); Greater trochanteric bursitis of hips (Bilateral); Osteoarthritis of hips (Bilateral); Insomnia secondary to chronic pain; Abnormal glucose; Hyperlipidemia; Pharmacologic therapy; Uncomplicated opioid dependence (HCC); Lumbar facet syndrome  (Right); Spondylosis without myelopathy or radiculopathy, lumbosacral region; Chronic hip pain (Left); Chronic low back pain (Right) w/o sciatica; Iron deficiency; Overweight (BMI 25.0-29.9); Abnormal drug screen (08/25/2020); and Marijuana use on their problem list.   Observations/Objective:  BP 124/72   Pulse 83   Temp 97.9 F (36.6 C)   Resp 16   Ht 5\' 7"  (1.702 m)   Wt 175 lb (79.4 kg)   SpO2 97%   BMI 27.41 kg/m   HEENT - WNL, except moderate deformity of the lobule of the Rt ear.  Procedure ( CPT [phone])     After informed consent & aseptic prep with alcohol, the involved areas of the skin flaps of the Rt ear lobule were anesthetized with 1.5 ml of Marcaine 0.5%. The with a #10 scalpel the resultant deformed scar remnants were debrided anterior with very satisfactory cosmetic results  & posterior margins to allow anatomic re-alignment of the segment of the lobule. Then the skin edges were meticulous aligned, everted and secured with # 10 interrupted sutures of Nylon 5-0. Then antibiotic ung applied & covered with sterile dressing. Patient instructed in post-op wound care .    Assessment and Plan:  1. Essential hypertension   2. Laceration of right ear, initial encounter    Follow Up Instructions:  ROV 6 days to re-check         I discussed the assessment and treatment plan with the patient. The patient was provided an opportunity to ask questions and all were answered. The patient agreed with the plan and demonstrated an understanding of the instructions.       The patient was advised to call back or seek an in-person evaluation if the symptoms worsen or if the condition fails to improve as anticipated.   [id number], MD

## 2021-10-19 ENCOUNTER — Encounter: Payer: Self-pay | Admitting: Internal Medicine

## 2021-10-19 ENCOUNTER — Ambulatory Visit (INDEPENDENT_AMBULATORY_CARE_PROVIDER_SITE_OTHER): Payer: BC Managed Care – PPO | Admitting: Internal Medicine

## 2021-10-19 ENCOUNTER — Other Ambulatory Visit: Payer: Self-pay | Admitting: Nurse Practitioner

## 2021-10-19 VITALS — BP 124/72 | HR 83 | Temp 97.9°F | Resp 16 | Ht 67.0 in | Wt 175.0 lb

## 2021-10-19 DIAGNOSIS — S01311A Laceration without foreign body of right ear, initial encounter: Secondary | ICD-10-CM

## 2021-10-19 DIAGNOSIS — I10 Essential (primary) hypertension: Secondary | ICD-10-CM

## 2021-10-19 DIAGNOSIS — B001 Herpesviral vesicular dermatitis: Secondary | ICD-10-CM

## 2021-10-19 MED ORDER — VALACYCLOVIR HCL 500 MG PO TABS: ORAL_TABLET | ORAL | 0 refills | Status: DC

## 2021-10-25 ENCOUNTER — Ambulatory Visit: Payer: BC Managed Care – PPO | Admitting: Internal Medicine

## 2021-10-25 ENCOUNTER — Encounter: Payer: Self-pay | Admitting: Internal Medicine

## 2021-10-25 VITALS — BP 122/76 | HR 74 | Temp 97.4°F | Resp 16 | Ht 67.0 in | Wt 175.0 lb

## 2021-10-25 DIAGNOSIS — S01311A Laceration without foreign body of right ear, initial encounter: Secondary | ICD-10-CM

## 2021-10-25 NOTE — Progress Notes (Signed)
   Patient returns post repair of a deformed lobule of her Rt ear.   Wound appears clean w/o signs of infection  & sutures removed w/o difficulty.

## 2021-11-01 ENCOUNTER — Ambulatory Visit (INDEPENDENT_AMBULATORY_CARE_PROVIDER_SITE_OTHER): Payer: BC Managed Care – PPO | Admitting: Obstetrics and Gynecology

## 2021-11-01 ENCOUNTER — Other Ambulatory Visit (HOSPITAL_COMMUNITY)
Admission: RE | Admit: 2021-11-01 | Discharge: 2021-11-01 | Disposition: A | Discharge status: Home / Self Care | Payer: BC Managed Care – PPO | Source: Ambulatory Visit | Attending: Obstetrics and Gynecology | Admitting: Obstetrics and Gynecology

## 2021-11-01 ENCOUNTER — Encounter: Payer: Self-pay | Admitting: Obstetrics and Gynecology

## 2021-11-01 VITALS — BP 122/72 | HR 77 | Ht 66.14 in | Wt 174.0 lb

## 2021-11-01 DIAGNOSIS — Z23 Encounter for immunization: Secondary | ICD-10-CM | POA: Diagnosis not present

## 2021-11-01 DIAGNOSIS — R102 Pelvic and perineal pain: Secondary | ICD-10-CM | POA: Diagnosis not present

## 2021-11-01 DIAGNOSIS — Z124 Encounter for screening for malignant neoplasm of cervix: Secondary | ICD-10-CM | POA: Diagnosis not present

## 2021-11-01 DIAGNOSIS — Z01419 Encounter for gynecological examination (general) (routine) without abnormal findings: Secondary | ICD-10-CM

## 2021-11-01 DIAGNOSIS — Z8041 Family history of malignant neoplasm of ovary: Secondary | ICD-10-CM

## 2021-11-01 DIAGNOSIS — Z803 Family history of malignant neoplasm of breast: Secondary | ICD-10-CM

## 2021-11-01 NOTE — Patient Instructions (Signed)
EXERCISE AND DIET:  We recommended that you start or continue a regular exercise program for good health. Regular exercise means any activity that makes your heart beat faster and makes you sweat.  We recommend exercising at least 30 minutes per day at least 3 days a week, preferably 4 or 5.  We also recommend a diet low in fat and sugar.  Inactivity, poor dietary choices and obesity can cause diabetes, heart attack, stroke, and kidney damage, among others.    ALCOHOL AND SMOKING:  Women should limit their alcohol intake to no more than 7 drinks/beers/glasses of wine (combined, not each!) per week. Moderation of alcohol intake to this level decreases your risk of breast cancer and liver damage. And of course, no recreational drugs are part of a healthy lifestyle.  And absolutely no smoking or even second hand smoke. Most people know smoking can cause heart and lung diseases, but did you know it also contributes to weakening of your bones? Aging of your skin?  Yellowing of your teeth and nails?  CALCIUM AND VITAMIN D:  Adequate intake of calcium and Vitamin D are recommended.  The recommendations for exact amounts of these supplements seem to change often, but generally speaking 600 mg of calcium (either carbonate or citrate) and 800 units of Vitamin D per day seems prudent. Certain women may benefit from higher intake of Vitamin D.  If you are among these women, your doctor will have told you during your visit.    PAP SMEARS:  Pap smears, to check for cervical cancer or precancers,  have traditionally been done yearly, although recent scientific advances have shown that most women can have pap smears less often.  However, every woman still should have a physical exam from her gynecologist every year. It will include a breast check, inspection of the vulva and vagina to check for abnormal growths or skin changes, a visual exam of the cervix, and then an exam to evaluate the size and shape of the uterus and  ovaries.  And after 55 years of age, a rectal exam is indicated to check for rectal cancers. We will also provide age appropriate advice regarding health maintenance, like when you should have certain vaccines, screening for sexually transmitted diseases, bone density testing, colonoscopy, mammograms, etc.   MAMMOGRAMS:  All women over 40 years old should have a yearly mammogram. Many facilities now offer a "3D" mammogram, which may cost around $50 extra out of pocket. If possible,  we recommend you accept the option to have the 3D mammogram performed.  It both reduces the number of women who will be called back for extra views which then turn out to be normal, and it is better than the routine mammogram at detecting truly abnormal areas.    COLONOSCOPY:  Colonoscopy to screen for colon cancer is recommended for all women at age 50.  We know, you hate the idea of the prep.  We agree, BUT, having colon cancer and not knowing it is worse!!  Colon cancer so often starts as a polyp that can be seen and removed at colonscopy, which can quite literally save your life!  And if your first colonoscopy is normal and you have no family history of colon cancer, most women don't have to have it again for 10 years.  Once every ten years, you can do something that may end up saving your life, right?  We will be happy to help you get it scheduled when you are ready.    Be sure to check your insurance coverage so you understand how much it will cost.  It may be covered as a preventative service at no cost, but you should check your particular policy.    Kegel Exercises There are many reasons your provider may suggest Kegel exercises. This video will teach you how to do Kegel exercises. To view the content, go to this web address: [email or web address].[email or web address]  This video will expire on: 07/08/2023. If you need access to this video following this date, please reach out to the healthcare provider who assigned it to  you. This information is not intended to replace advice given to you by your health care provider. Make sure you discuss any questions you have with your health care provider. [employer]vier Patient Education  [employer].

## 2021-11-01 NOTE — Progress Notes (Signed)
56 y.o. [id number] Married [place] American female here for annual exam.  She is having trouble having a bowel movement. She has some pelvic pain.  She has seen her GI who recommended a GYN visit.  She does vaginal splinting to have a BM.  Her pelvic pain is occurring for at least a year.  It is not constant. Dull pain like a fibromyalgia flare that affects her hips and thighs.  She takes Tramadol for her fibromyalgia.   Has chronic bursitis.  No vaginal bleeding.  She had postmenopausal bleeding in 2019 and saw her PCP.  She had thickened endometrium 6.8 mm. Left ovarian cyst seen 34 x 2.6 x 2.3 cm with a single septation.  She was treated with medication but she did not take the whole prescription because it made her bleed.  She did not have an endometrial biopsy done.   No pain with urination.  She does strain sometimes to void.  No leak of urine with cough or laugh.  She used to see Mauricia Area, FNP, for her GYN care.   PCP:   Dr. Mayford Knife GI: Dr. Russella Dar  No LMP recorded. Patient is perimenopausal.           Sexually active: Yes.    The current method of family planning is post menopausal status.    Exercising: No.  The patient does not participate in regular exercise at present. Smoker:  yes  Health Maintenance: Pap:  08/30/16 wnl HR HPV neg, 01/14/2012 WNL Hr HPV neg,  History of abnormal Pap:  yes MMG:  05/26/21 density B Bi-rads 1 neg  Colonoscopy:  2019 per patient f/u 5 years  BMD:   10/01/13  Result  noraml  TDaP:  08/30/16 Gardasil:   no HIV: neg years ago  Hep C: neg years ago per patient  Screening Labs:  pcp Hb today: neg , Urine today: none  Flu vaccine today.  She will do her Covid vaccine this week.    reports that she has been smoking cigarettes. She started smoking about 32 years ago. She has a 4.65 pack-year smoking history. She has never used smokeless tobacco. She reports current alcohol use of about 1.0 standard drink of alcohol per week. She reports  that she does not use drugs.  Past Medical History:  Diagnosis Date   Allergy    Anemia    Anxiety    Arthritis    Fibromyalgia    GERD (gastroesophageal reflux disease)    Hypertension    IBS (irritable bowel syndrome)    Nephrolithiasis    Plantar fasciitis of right foot    wears boot     Past Surgical History:  Procedure Laterality Date   BREATH TEK H PYLORI  07/11/2011   Procedure: BREATH TEK H PYLORI;  Surgeon: Mariella Saa, MD;  Location: [facility];  Service: General;  Laterality: N/A;   CHOLECYSTECTOMY  10/01/2011   Procedure: LAPAROSCOPIC CHOLECYSTECTOMY WITH INTRAOPERATIVE CHOLANGIOGRAM;  Surgeon: Mariella Saa, MD;  Location: [facility];  Service: General;  Laterality: N/A;   COLONOSCOPY     GASTRIC ROUX-EN-Y  10/01/2011   Procedure: LAPAROSCOPIC ROUX-EN-Y GASTRIC;  Surgeon: Mariella Saa, MD;  Location: [facility];  Service: General;  Laterality: N/A;   Left shoulder arthroscopic surgery Left    bursectomy, in her early 60s   tummy tuck  2005    Current Outpatient Medications  Medication Sig Dispense Refill   Albuterol Sulfate (PROVENTIL HFA IN) Inhale 108 mcg into the  lungs as needed.     Aspirin-Acetaminophen-Caffeine (EXCEDRIN EXTRA STRENGTH PO) Take 1 tablet by mouth 3 (three) times daily as needed (headache).      betamethasone dipropionate (DIPROLENE) 0.05 % ointment Apply topically 2 (two) times daily. 30 g 0   cetirizine-pseudoephedrine (ZYRTEC-D) 5-120 MG tablet Take 1 tablet by mouth every 12 (twelve) hours as needed for allergies.      diphenhydrAMINE (BENADRYL) 25 MG tablet Take 25-50 mg by mouth 3 (three) times daily as needed for allergies.     DULoxetine (CYMBALTA) 30 MG capsule TAKE 1 CAPSULE DAILY WITH 60 MG TABLET TO EQUAL 90 MG DAILY 90 capsule 3   DULoxetine (CYMBALTA) 60 MG capsule TAKE 1 CAPSULE DAILY WITH 30 MG TABLET TO EQUAL 90 MG 90 capsule 3   gabapentin (NEURONTIN) 600 MG tablet TAKE ONE TABLET BY MOUTH THREE TIMES A DAY AND  TAKE 1 AND 1/2 TABLET AT BEDTIME 135 tablet 1   Glucosamine-Chondroitin (COSAMIN DS PO) Take 1,500 mg by mouth daily.     Iron-FA-B Cmp-C-Biot-Probiotic (FUSION PLUS) CAPS Take 1 cap every 2-3 days as tolerated for chronic iron deficiency. 30 capsule 0   lactulose (CHRONULAC) 10 GM/15ML solution TAKE 30MLS BY MOUTH TWO TIMES A DAY 946 mL 1   losartan-hydrochlorothiazide (HYZAAR) 100-25 MG tablet Take 1 tablet by mouth daily. 30 tablet 11   lubiprostone (AMITIZA) 24 MCG capsule Take 1 capsule (24 mcg total) by mouth 2 (two) times daily with a meal. 60 capsule 3   Multiple Vitamins-Minerals (MULTIVITAMIN WITH MINERALS) tablet Take 1 tablet by mouth daily. Marland Kitchen     NOVOFINE 32G X 6 MM MISC Needs daily for victoza 100 each 5   omeprazole (PRILOSEC) 20 MG capsule Take 1 capsule (20 mg total) by mouth daily. 30 capsule 1   QUEtiapine (SEROQUEL) 25 MG tablet Take three tablets by mouth every night at bedtime 270 tablet 2   Semaglutide-Weight Management (WEGOVY) 2.4 MG/0.75ML SOAJ INJECT 2.4 MG UNDER THE SKIN ONCE A WEEK 3 mL 5   traMADol (ULTRAM) 50 MG tablet Take 1 tablet (50 mg total) by mouth every 6 (six) hours as needed for severe pain. Each refill must last 30 days. 120 tablet 5   valACYclovir (VALTREX) 500 MG tablet TAKE ONE TABLET BY MOUTH DAILY AS NEEDED FOR COLD SORES 30 tablet 0   Vitamin D, Ergocalciferol, (DRISDOL) 1.25 MG (50000 UNIT) CAPS capsule TAKE 1 CAPSULE TWO TIMES A WEEK 26 capsule 3   No current facility-administered medications for this visit.    Family History  Problem Relation Age of Onset   Breast cancer Mother 85       breast   Cancer Mother 67       breast/ ovarian   Heart disease Father    Hypertension Father    Stroke Father    Obesity Brother    Lung cancer Maternal Grandmother    Cancer Maternal Grandmother        ovarian   Heart attack Maternal Grandfather    Heart disease Maternal Grandfather    Heart attack Paternal Grandfather    Breast cancer Maternal  Aunt 49       died at age 63   Multiple sclerosis Maternal Uncle    Colon cancer Neg Hx    Colon polyps Neg Hx    Esophageal cancer Neg Hx    Rectal cancer Neg Hx     Review of Systems  Genitourinary:  Positive for pelvic pain.  All other  systems reviewed and are negative.   Exam:   BP 122/72   Pulse 77   Ht 5' 6.14" (1.68 m)   Wt 174 lb (78.9 kg)   SpO2 100%   BMI 27.96 kg/m     General appearance: alert, cooperative and appears stated age Head: normocephalic, without obvious abnormality, atraumatic Neck: no adenopathy, supple, symmetrical, trachea midline and thyroid normal to inspection and palpation Lungs: clear to auscultation bilaterally Breasts: normal appearance, no masses or tenderness, No nipple retraction or dimpling, No nipple discharge or bleeding, No axillary adenopathy Heart: regular rate and rhythm Abdomen: soft, non-tender; no masses, no organomegaly Extremities: extremities normal, atraumatic, no cyanosis or edema Skin: skin color, texture, turgor normal. No rashes or lesions Lymph nodes: cervical, supraclavicular, and axillary nodes normal. Neurologic: grossly normal  Pelvic: External genitalia:  no lesions.  Medium brown pigmentation at the posterior introitus.  Small 1 mm firm white raised area.               No abnormal inguinal nodes palpated.              Urethra:  normal appearing urethra with no masses, tenderness or lesions              Bartholins and Skenes: normal                 Vagina: normal appearing vagina with normal color and discharge, no lesions.  First degree cystocele and first degree rectocele with Valsalva.              Cervix: no lesions              Pap taken: yes Bimanual Exam:  Uterus:  normal size, contour, position, consistency, mobility, non-tender              Adnexa: no mass, fullness, tenderness              Rectal exam: yes.  Confirms.              Anus:  normal sphincter tone, no lesions  Chaperone was present for  exam:  Babs Sciara, CMA  Assessment:   Well woman visit with gynecologic exam. Pelvic pain.  Cervical cancer screening.  ?hx abnormal pap Mild cystocele and rectocele. Hx HSV I. Uses Valtrex. Breast and ovarian cancer in family.   Tobacco use.  Declines cessation.    Plan: Mammogram screening discussed. Self breast awareness reviewed. Pap and HR HPV collected. Guidelines for Calcium, Vitamin D, regular exercise program including cardiovascular and weight bearing exercise. Kegel exercises recommended.  QR reader for video given in after visit summary. Return for pelvic US.  Referral for genetic counseling and testing.   Benefits of counseling and testing discussed. Flu vaccine. Follow up annually and prn.   After visit summary provided.

## 2021-11-03 LAB — CYTOLOGY - PAP
Comment: NEGATIVE
Diagnosis: NEGATIVE
Diagnosis: REACTIVE
High risk HPV: NEGATIVE

## 2021-11-16 ENCOUNTER — Other Ambulatory Visit: Payer: Self-pay | Admitting: Nurse Practitioner

## 2021-11-16 DIAGNOSIS — M797 Fibromyalgia: Secondary | ICD-10-CM

## 2021-11-21 ENCOUNTER — Ambulatory Visit: Payer: BC Managed Care – PPO | Admitting: Gastroenterology

## 2021-11-22 ENCOUNTER — Ambulatory Visit: Payer: BC Managed Care – PPO | Admitting: Gastroenterology

## 2021-12-03 ENCOUNTER — Other Ambulatory Visit: Payer: Self-pay | Admitting: Nurse Practitioner

## 2021-12-03 DIAGNOSIS — B001 Herpesviral vesicular dermatitis: Secondary | ICD-10-CM

## 2021-12-05 ENCOUNTER — Other Ambulatory Visit: Payer: Self-pay | Admitting: Nurse Practitioner

## 2021-12-05 DIAGNOSIS — M797 Fibromyalgia: Secondary | ICD-10-CM

## 2021-12-11 NOTE — Progress Notes (Signed)
GYNECOLOGY  VISIT   HPI: 56 y.o.   Married  [place] American  female   [phone] with no LMP recorded.  Postmenopausal. here for  pelvic US consult.   Patient decided to leave and not stay for the consultation.  She was called by phone and a two patient identifier was used for the phone visit.   Patient received recommendation for gynecology evaluation from her GI, Dr. Fuller Plan. She has chronic pelvic pain and constipation, and she has been doing vaginal splinting to have bowel movements.  She has pain when has needs to have a BM.  When she evacuates, the pain resolves in an hour or two.  She found that the splinting helped her to successfully have BMs, so she does this consistently. She is treating constipation with Amitiza, metamucil, increased fiber, and Mg citrate.  The onset of the constipation and need to splint with the initiation of use of Wegovy. She may ask her weight management team to switch to another medication.   Has small cystocele and rectocele.  She has fibromyalgia. Hx gastric bypass surgery.  Last menstrual period was 20 years ago.   GYNECOLOGIC HISTORY: No LMP.  Postmenopausal. Contraception:  post menopausal Menopausal hormone therapy:  n/a Last mammogram:  05/26/21, Breast Density Category B, BI-RADS CATEGORY 1 Negative Last pap smear:  11/01/21 negative: HR HPV negative, 08/30/16 wnl HR HPV neg,         OB History     Gravida  3   Para  1   Term  1   Preterm      AB  2   Living  1      SAB  2   IAB      Ectopic      Multiple      Live Births                 Patient Active Problem List   Diagnosis Date Noted   Abnormal drug screen (08/25/2020) 02/27/2021   Marijuana use 02/27/2021   Iron deficiency 09/02/2020   Overweight (BMI 25.0-29.9) 09/02/2020   Lumbar facet syndrome (Right) 06/01/2020   Spondylosis without myelopathy or radiculopathy, lumbosacral region 06/01/2020   Chronic hip pain (Left) 06/01/2020   Chronic low back pain  (Right) w/o sciatica 06/01/2020   Pharmacologic therapy [id number]   Uncomplicated opioid dependence ([place]) 09/10/2019   Hyperlipidemia 09/02/2019   Abnormal glucose 09/01/2018   Insomnia secondary to chronic pain 06/23/2018   Greater trochanteric bursitis of hips (Bilateral) 06/24/2017   Osteoarthritis of hips (Bilateral) 06/24/2017   Chronic generalized pain (1ry area of Pain) 04/15/2017   Chronic pain syndrome 04/15/2017   Long term current use of opiate analgesic 04/15/2017   Chronic shoulder pain (3ry area of Pain) (Bilateral) (R>L) 04/15/2017   Chronic hip pain (4th area of Pain) (Bilateral) (R>L) 04/15/2017   Cervical spondylosis without myelopathy 02/11/2017   Fibromyalgia affecting multiple sites (2ry area of Pain) 02/11/2017   Spondylosis of lumbar spine 02/11/2017   Medication management 08/30/2014   Vitamin D deficiency 08/30/2014   S/P gastric bypass 08/30/2014   Tobacco abuse 10/11/2010   Anxiety state 05/12/2008   Essential hypertension 05/12/2008    Past Medical History:  Diagnosis Date   Allergy    Anemia    Anxiety    Arthritis    Fibromyalgia    GERD (gastroesophageal reflux disease)    Hypertension    IBS (irritable bowel syndrome)    Nephrolithiasis    Plantar  fasciitis of right foot    wears boot     Past Surgical History:  Procedure Laterality Date   BREATH TEK H PYLORI  07/11/2011   Procedure: BREATH TEK H PYLORI;  Surgeon: Mariella Saa, MD;  Location: [facility];  Service: General;  Laterality: N/A;   CHOLECYSTECTOMY  10/01/2011   Procedure: LAPAROSCOPIC CHOLECYSTECTOMY WITH INTRAOPERATIVE CHOLANGIOGRAM;  Surgeon: Mariella Saa, MD;  Location: [facility];  Service: General;  Laterality: N/A;   COLONOSCOPY     GASTRIC ROUX-EN-Y  10/01/2011   Procedure: LAPAROSCOPIC ROUX-EN-Y GASTRIC;  Surgeon: Mariella Saa, MD;  Location: [facility];  Service: General;  Laterality: N/A;   Left shoulder arthroscopic surgery Left    bursectomy, in  her early 67s   tummy tuck  2005    Current Outpatient Medications  Medication Sig Dispense Refill   Albuterol Sulfate (PROVENTIL HFA IN) Inhale 108 mcg into the lungs as needed.     Aspirin-Acetaminophen-Caffeine (EXCEDRIN EXTRA STRENGTH PO) Take 1 tablet by mouth 3 (three) times daily as needed (headache).      betamethasone dipropionate (DIPROLENE) 0.05 % ointment Apply topically 2 (two) times daily. 30 g 0   cetirizine-pseudoephedrine (ZYRTEC-D) 5-120 MG tablet Take 1 tablet by mouth every 12 (twelve) hours as needed for allergies.      diphenhydrAMINE (BENADRYL) 25 MG tablet Take 25-50 mg by mouth 3 (three) times daily as needed for allergies.     DULoxetine (CYMBALTA) 30 MG capsule TAKE 1 CAPSULE DAILY WITH 60 MG TABLET TO EQUAL 90 MG DAILY 90 capsule 3   DULoxetine (CYMBALTA) 60 MG capsule TAKE 1 CAPSULE DAILY WITH 30 MG TABLET TO EQUAL 90 MG 90 capsule 3   gabapentin (NEURONTIN) 600 MG tablet TAKE ONE TABLET BY MOUTH THREE TIMES A DAY AND TAKE 1 AND 1/2 TABLETS AT BEDTIME 135 tablet 1   Glucosamine-Chondroitin (COSAMIN DS PO) Take 1,500 mg by mouth daily.     Iron-FA-B Cmp-C-Biot-Probiotic (FUSION PLUS) CAPS Take 1 cap every 2-3 days as tolerated for chronic iron deficiency. 30 capsule 0   lactulose (CHRONULAC) 10 GM/15ML solution TAKE BY MOUTH TWO TIMES A DAY 946 mL 1   losartan-hydrochlorothiazide (HYZAAR) 100-25 MG tablet Take 1 tablet by mouth daily. 30 tablet 11   lubiprostone (AMITIZA) 24 MCG capsule Take 1 capsule (24 mcg total) by mouth 2 (two) times daily with a meal. 60 capsule 3   Multiple Vitamins-Minerals (MULTIVITAMIN WITH MINERALS) tablet Take 1 tablet by mouth daily. Marland Kitchen     NOVOFINE 32G X 6 MM MISC Needs daily for victoza 100 each 5   omeprazole (PRILOSEC) 20 MG capsule TAKE 1 CAPSULE BY MOUTH DAILY 30 capsule 1   QUEtiapine (SEROQUEL) 25 MG tablet TAKE THREE TABLETS BY MOUTH EVERY NIGHT AT BEDTIME 90 tablet 1   Semaglutide-Weight Management (WEGOVY) 2.4 MG/0.75ML  SOAJ INJECT 2.4 MG UNDER THE SKIN ONCE A WEEK 3 mL 5   traMADol (ULTRAM) 50 MG tablet Take 1 tablet (50 mg total) by mouth every 6 (six) hours as needed for severe pain. Each refill must last 30 days. 120 tablet 5   valACYclovir (VALTREX) 500 MG tablet TAKE 1 TABLET BY MOUTH DAILY AS NEEDED FOR COLD SORES 30 tablet 0   Vitamin D, Ergocalciferol, (DRISDOL) 1.25 MG (50000 UNIT) CAPS capsule TAKE 1 CAPSULE TWO TIMES A WEEK 26 capsule 3   No current facility-administered medications for this visit.     ALLERGIES: Patient has no known allergies.  Family History  Problem Relation Age of Onset   Breast cancer Mother 26       breast   Cancer Mother 55       breast/ ovarian   Heart disease Father    Hypertension Father    Stroke Father    Obesity Brother    Lung cancer Maternal Grandmother    Cancer Maternal Grandmother        ovarian   Heart attack Maternal Grandfather    Heart disease Maternal Grandfather    Heart attack Paternal Grandfather    Breast cancer Maternal Aunt 49       died at age 75   Multiple sclerosis Maternal Uncle    Colon cancer Neg Hx    Colon polyps Neg Hx    Esophageal cancer Neg Hx    Rectal cancer Neg Hx     Social History   Socioeconomic History   Marital status: Married    Spouse name: Not on file   Number of children: 1   Years of education: Not on file   Highest education level: Not on file  Occupational History    Comment: Passenger transport manager for [employer]  Tobacco Use   Smoking status: Every Day    Packs/day: 0.15    Years: 31.00    Total pack years: 4.65    Types: Cigarettes    Start date: 44   Smokeless tobacco: Never  Vaping Use   Vaping Use: Never used  Substance and Sexual Activity   Alcohol use: Yes    Alcohol/week: 1.0 standard drink of alcohol    Types: 1 Glasses of wine per week    Comment: 1x week   Drug use: No   Sexual activity: Yes    Partners: Male    Birth control/protection: Post-menopausal    Comment: 1st  intercourse- 18, partners-?, married-  Other Topics Concern   Not on file  Social History Narrative   Not on file   Social Determinants of Health   Financial Resource Strain: Not on file  Food Insecurity: Not on file  Transportation Needs: Not on file  Physical Activity: Not on file  Stress: Not on file  Social Connections: Not on file  Intimate Partner Violence: Not on file    Review of Systems  All other systems reviewed and are negative.   PHYSICAL EXAMINATION:    BP 124/80 (BP Location: Right Arm, Patient Position: Sitting, Cuff Size: Normal)   Pulse 78   Ht 5\' 6"  (1.676 m)   Wt 174 lb (78.9 kg)   LMP  (LMP Unknown)   BMI 28.08 kg/m     General appearance: alert, cooperative and appears stated age  Pelvic US Uterus 7.15 x 4.27 x 3.01 cm.  No myometrial masses. EMS 1.98 mm.   Left ovary 3.64 x 2.32 x 1.58 cm.   Atrophic  Right ovary 3.33 x 1.78 x 1.53 cm.   Atrophic.  No adnexal masses.  No free fluid.    ASSESSMENT  Pelvic pain.  Constipation and vaginal splinting.  Recent initiation of Wegovy.  Small cystocele and rectocele.   PLAN  Pelvic US findings reviewed.  Continue medical therapy for constipation.  I do not believe that surgical care for her minor prolapse will resolve her splinting at this time.  We will reassess prolapse if pelvic symptoms persist or progress. Questions invited and answered.   An After Visit Summary was printed and given to the patient.  Telephone conversation of 12 minutes and then  20 minutes total time was spent for this patient encounter, including preparation, coordination of care, and documentation of the encounter.

## 2021-12-12 ENCOUNTER — Other Ambulatory Visit: Payer: Self-pay | Admitting: Obstetrics and Gynecology

## 2021-12-12 DIAGNOSIS — Z8041 Family history of malignant neoplasm of ovary: Secondary | ICD-10-CM

## 2021-12-12 DIAGNOSIS — Z803 Family history of malignant neoplasm of breast: Secondary | ICD-10-CM

## 2021-12-13 ENCOUNTER — Ambulatory Visit (INDEPENDENT_AMBULATORY_CARE_PROVIDER_SITE_OTHER): Payer: BC Managed Care – PPO

## 2021-12-13 ENCOUNTER — Encounter: Payer: Self-pay | Admitting: Obstetrics and Gynecology

## 2021-12-13 ENCOUNTER — Ambulatory Visit (INDEPENDENT_AMBULATORY_CARE_PROVIDER_SITE_OTHER): Payer: BC Managed Care – PPO | Admitting: Obstetrics and Gynecology

## 2021-12-13 VITALS — BP 124/80 | HR 78 | Ht 66.0 in | Wt 174.0 lb

## 2021-12-13 DIAGNOSIS — Z8041 Family history of malignant neoplasm of ovary: Secondary | ICD-10-CM | POA: Diagnosis not present

## 2021-12-13 DIAGNOSIS — K5900 Constipation, unspecified: Secondary | ICD-10-CM

## 2021-12-13 DIAGNOSIS — R102 Pelvic and perineal pain: Secondary | ICD-10-CM | POA: Diagnosis not present

## 2021-12-13 DIAGNOSIS — N8110 Cystocele, unspecified: Secondary | ICD-10-CM

## 2021-12-16 ENCOUNTER — Encounter: Payer: Self-pay | Admitting: Obstetrics and Gynecology

## 2021-12-20 ENCOUNTER — Ambulatory Visit: Payer: BC Managed Care – PPO | Admitting: Gastroenterology

## 2021-12-26 ENCOUNTER — Encounter: Payer: Self-pay | Admitting: Nurse Practitioner

## 2021-12-26 NOTE — Telephone Encounter (Signed)
Did she need a Wegovy prior [place]

## 2022-01-02 ENCOUNTER — Other Ambulatory Visit: Payer: Self-pay

## 2022-01-02 DIAGNOSIS — B001 Herpesviral vesicular dermatitis: Secondary | ICD-10-CM

## 2022-01-02 MED ORDER — VALACYCLOVIR HCL 500 MG PO TABS: ORAL_TABLET | ORAL | 0 refills | Status: DC

## 2022-01-12 ENCOUNTER — Other Ambulatory Visit: Payer: Self-pay | Admitting: Nurse Practitioner

## 2022-01-12 DIAGNOSIS — M797 Fibromyalgia: Secondary | ICD-10-CM

## 2022-01-30 ENCOUNTER — Other Ambulatory Visit: Payer: Self-pay | Admitting: Nurse Practitioner

## 2022-01-30 DIAGNOSIS — B001 Herpesviral vesicular dermatitis: Secondary | ICD-10-CM

## 2022-02-11 ENCOUNTER — Other Ambulatory Visit: Payer: Self-pay | Admitting: Nurse Practitioner

## 2022-02-11 DIAGNOSIS — M797 Fibromyalgia: Secondary | ICD-10-CM

## 2022-02-18 NOTE — Patient Instructions (Signed)
____________________________________________________________________________________________  Opioid Pain Medication Update  To: All patients taking opioid pain medications. (I.e.: hydrocodone, hydromorphone, oxycodone, oxymorphone, morphine, codeine, methadone, tapentadol, tramadol, buprenorphine, fentanyl, etc.)  Re: Updated review of side effects and adverse reactions of opioid analgesics, as well as new information about long term effects of this class of medications.  Direct risks of long-term opioid therapy are not limited to opioid addiction and overdose. Potential medical risks include serious fractures, breathing problems during sleep, hyperalgesia, immunosuppression, chronic constipation, bowel obstruction, myocardial infarction, and tooth decay secondary to xerostomia.  Unpredictable adverse effects that can occur even if you take your medication correctly: Cognitive impairment, respiratory depression, and death. Most people think that if they take their medication "correctly", and "as instructed", that they will be safe. Nothing could be farther from the truth. In reality, a significant amount of recorded deaths associated with the use of opioids has occurred in individuals that had taken the medication for a long time, and were taking their medication correctly. The following are examples of how this can happen: Patient taking his/her medication for a long time, as instructed, without any side effects, is given a certain antibiotic or another unrelated medication, which in turn triggers a "Drug-to-drug interaction" leading to disorientation, cognitive impairment, impaired reflexes, respiratory depression or an untoward event leading to serious bodily harm or injury, including death.  Patient taking his/her medication for a long time, as instructed, without any side effects, develops an acute impairment of liver and/or kidney function. This will lead to a rapid inability of the body to  breakdown and eliminate their pain medication, which will result in effects similar to an "overdose", but with the same medicine and dose that they had always taken. This again may lead to disorientation, cognitive impairment, impaired reflexes, respiratory depression or an untoward event leading to serious bodily harm or injury, including death.  A similar problem will occur with patients as they grow older and their liver and kidney function begins to decrease as part of the aging process.  Background information: Historically, the original case for using long-term opioid therapy to treat chronic noncancer pain was based on safety assumptions that subsequent experience has called into question. In 1996, the American Pain Society and the [place] [employer] issued a consensus statement supporting long-term opioid therapy. This statement acknowledged the dangers of opioid prescribing but concluded that the risk for addiction was low; respiratory depression induced by opioids was short-lived, occurred mainly in opioid-naive patients, and was antagonized by pain; tolerance was not a common problem; and efforts to control diversion should not constrain opioid prescribing. This has now proven to be wrong. Experience regarding the risks for opioid addiction, misuse, and overdose in community practice has failed to support these assumptions.  According to the Centers for Disease Control and Prevention, fatal overdoses involving opioid analgesics have increased sharply over the past decade. Currently, more than 96,700 people die from drug overdoses every year. Opioids are a factor in 7 out of every 10 overdose deaths. Deaths from drug overdose have surpassed motor vehicle accidents as the leading cause of death for individuals between the ages of 3 and 36.  Clinical data suggest that neuroendocrine dysfunction may be very common in both men and women, potentially causing hypogonadism, erectile  dysfunction, infertility, decreased libido, osteoporosis, and depression. Recent studies linked higher opioid dose to increased opioid-related mortality. Controlled observational studies reported that long-term opioid therapy may be associated with increased risk for cardiovascular events. Subsequent meta-analysis concluded  that the safety of long-term opioid therapy in elderly patients has not been proven.   Side Effects and adverse reactions: Common side effects: Drowsiness (sedation). Dizziness. Nausea and vomiting. Constipation. Physical dependence -- Dependence often manifests with withdrawal symptoms when opioids are discontinued or decreased. Tolerance -- As you take repeated doses of opioids, you require increased medication to experience the same effect of pain relief. Respiratory depression -- This can occur in healthy people, especially with higher doses. However, people with COPD, asthma or other lung conditions may be even more susceptible to fatal respiratory impairment.  Uncommon side effects: An increased sensitivity to feeling pain and extreme response to pain (hyperalgesia). Chronic use of opioids can lead to this. Delayed gastric emptying (the process by which the contents of your stomach are moved into your small intestine). Muscle rigidity. Immune system and hormonal dysfunction. Quick, involuntary muscle jerks (myoclonus). Arrhythmia. Itchy skin (pruritus). Dry mouth (xerostomia).  Long-term side effects: Chronic constipation. Sleep-disordered breathing (SDB). Increased risk of bone fractures. Hypothalamic-pituitary-adrenal dysregulation. Increased risk of overdose.  RISKS: Fractures and Falls:  Opioids increase the risk and incidence of falls. This is of particular importance in elderly patients.  Endocrine System:  Long-term administration is associated with endocrine abnormalities. Influences on both the hypothalamic-pituitary-adrenal axis?and the  hypothalamic-pituitary-gonadal axis have been demonstrated with consequent hypogonadism and adrenal insufficiency in both sexes. Hypogonadism and decreased levels of dehydroepiandrosterone sulfate have been reported in men and women. Endocrine effects can lead to: Amenorrhoea in women Reduced libido in both sexes Erectile dysfunction in men Infertility Depression and fatigue Patients (particularly women of childbearing age) should avoid opioids. There is insufficient evidence to recommend routine monitoring of asymptomatic patients taking opioids in the long-term for hormonal deficiencies.  Immune System: Human studies have demonstrated that opioids have an immunomodulating effect. These effects are mediated via opioid receptors both on immune effector cells and in the central nervous system. Opioids have been demonstrated to have adverse effects on antimicrobial response and anti-tumour surveillance. Buprenorphine has been demonstrated to have no impact on immune function.  Opioid Induced Hyperalgesia: Human studies have demonstrated that prolonged use of opioids can lead to a state of abnormal pain sensitivity, sometimes called opioid induced hyperalgesia (OIH). Opioid induced hyperalgesia is not usually seen in the absence of tolerance to opioid analgesia. Clinically, hyperalgesia may be diagnosed if the patient on long-term opioid therapy presents with increased pain. This might be qualitatively and anatomically distinct from pain related to disease progression or to breakthrough pain resulting from development of opioid tolerance. Pain associated with hyperalgesia tends to be more diffuse than the pre-existing pain and less defined in quality. Management of opioid induced hyperalgesia requires opioid dose reduction.  Cancer: Chronic opioid therapy has been associated with an increased risk of cancer among noncancer patients with chronic pain. This association was more evident in chronic  strong opioid users. Chronic opioid consumption causes significant pathological changes in the small intestine and colon. Epidemiological studies have found that there is a link between opium dependence and initiation of gastrointestinal cancers. Cancer is the second leading cause of death after cardiovascular disease. Chronic use of opioids can cause multiple conditions such as GERD, immunosuppression and renal damage as well as carcinogenic effects, which are associated with the incidence of cancers.   Mortality: Long-term opioid use has been associated with increased mortality among patients with chronic non-cancer pain (CNCP).  Prescription of long-acting opioids for chronic noncancer pain was associated with a significantly increased risk of all-cause mortality,  including deaths from causes other than overdose.  Reference: Von Korff M, Kolodny A, Deyo RA, Chou R. Long-term opioid therapy reconsidered. Ann Intern Med. 2011 Sep 6;155(5):325-8. doi: 10.7326/[id number]. PMID: 21893626; PMCID: PMC3280085. Bedson J, Chen Y, Ashworth J, Hayward RA, Dunn KM, Jordan KP. Risk of adverse events in patients prescribed long-term opioids: A cohort study in the [place] Clinical Practice Research Datalink. Eur J Pain. 2019 May;23(5):908-922. doi: 10.1002/ejp.1357. Epub 2019 Jan 31. PMID: 30620116. Colameco S, Coren JS, Ciervo CA. Continuous opioid treatment for chronic noncancer pain: a time for moderation in prescribing. Postgrad Med. 2009 Jul;121(4):61-6. doi: 10.3810/pgm.2009.07.2032. PMID: 19641271. Chou R, Turner JA, Devine EB, Hansen RN, Sullivan SD, Blazina I, Dana T, Bougatsos C, Deyo RA. The effectiveness and risks of long-term opioid therapy for chronic pain: a systematic review for a National Institutes of Health Pathways to Prevention Workshop. Ann Intern Med. 2015 Feb 17;162(4):276-86. doi: 10.7326/M14-2559. PMID: 25581257. Warner M, Chen LH, Makuc DM. NCHS Data Brief No. 22. [place]:  Centers for Disease Control and Prevention; 2009. Sep, Increase in Fatal Poisonings Involving Opioid Analgesics in the [place], 1999-2006. Song IA, Choi HR, Oh TK. Long-term opioid use and mortality in patients with chronic non-cancer pain: Ten-year follow-up study in [place] from 2010 through 2019. EClinicalMedicine. 2022 Jul 18;51:101558. doi: 10.1016/j.eclinm.2022.101558. PMID: 35875817; PMCID: PMC9304910. Huser, W., Schubert, T., Vogelmann, T. et al. All-cause mortality in patients with long-term opioid therapy compared with non-opioid analgesics for chronic non-cancer pain: a database study. BMC Med 18, 162 (2020). https://doi.org/10.1186/[email or web address] Rashidian H, Zendehdel K, Kamangar F, Malekzadeh R, Haghdoost AA. An Ecological Study of the Association between Opiate Use and Incidence of Cancers. Addict Health. 2016 Fall;8(4):252-260. PMID: 28819556; PMCID: PMC5554805.  Our Goal: Our goal is to control your pain with means other than the use of opioid pain medications.  Our Recommendation: Talk to your physician about coming off of these medications. We can assist you with the tapering down and stopping these medicines. Based on the new information, even if you cannot completely stop the medication, a decrease in the dose may be associated with a lesser risk. Ask for other means of controlling the pain. Decrease or eliminate those factors that significantly contribute to your pain such as smoking, obesity, and a diet heavily tilted towards "inflammatory" nutrients.  ____________________________________________________________________________________________     ____________________________________________________________________________________________  National Pain Medication Shortage  The [place] is experiencing worsening drug shortages. These have had a negative widespread effect on patient care and treatment. Not expected to improve any time soon. Predicted to last past  2029.   Drug shortage list (generic names) Oxycodone IR Oxycodone/APAP Oxymorphone IR Hydromorphone Hydrocodone/APAP Morphine  Where is the problem?  Manufacturing and supply level.  Will this shortage affect you?  Only if you take any of the above pain medications.  How? You may be unable to fill your prescription.  Your pharmacist may offer a "partial fill" of your prescription. (Warning: Do not accept partial fills.) Prescriptions partially filled cannot be transferred to another pharmacy. Read our Medication Rules and Regulation. Depending on how much medicine you are dependent on, you may experience withdrawals when unable to get the medication.  Recommendations: Consider ending your dependence on opioid pain medications. Ask your pain specialist to assist you with the process. Consider switching to a medication currently not in shortage, such as Buprenorphine. Talk to your pain specialist about this option. Consider decreasing your pain medication requirements by managing tolerance thru "Drug Holidays". This may help minimize withdrawals, should you run   out of medicine. Control your pain thru the use of non-pharmacological interventional therapies.   Your prescriber: Prescribers cannot be blamed for shortages. Medication manufacturing and supply issues cannot be fixed by the prescriber.   NOTE: The prescriber is not responsible for supplying the medication, or solving supply issues. Work with your pharmacist to solve it. The patient is responsible for the decision to take or continue taking the medication and for identifying and securing a legal supply source. By law, supplying the medication is the job and responsibility of the pharmacy. The prescriber is responsible for the evaluation, monitoring, and prescribing of these medications.   Prescribers will NOT: Re-issue prescriptions that have been partially filled. Re-issue prescriptions already sent to a pharmacy.  Re-send  prescriptions to a different pharmacy because yours did not have your medication. Ask pharmacist to order more medicine or transfer the prescription to another pharmacy. (Read below.)  New 2023 regulation: "September 15, 2021 Revised Regulation Allows DEA-Registered Pharmacies to Transfer Electronic Prescriptions at a Patient's Request DEA Headquarters Division - Public Information Office Patients now have the ability to request their electronic prescription be transferred to another pharmacy without having to go back to their practitioner to initiate the request. This revised regulation went into effect on Monday, September 11, 2021.     At a patient's request, a DEA-registered retail pharmacy can now transfer an electronic prescription for a controlled substance (schedules II-V) to another DEA-registered retail pharmacy. Prior to this change, patients would have to go through their practitioner to cancel their prescription and have it re-issued to a different pharmacy. The process was taxing and time consuming for both patients and practitioners.    The Drug Enforcement Administration (DEA) published its intent to revise the process for transferring electronic prescriptions on December 04, 2019.  The final rule was published in the federal register on August 10, 2021 and went into effect 30 days later.  Under the final rule, a prescription can only be transferred once between pharmacies, and only if allowed under existing state or other applicable law. The prescription must remain in its electronic form; may not be altered in any way; and the transfer must be communicated directly between two licensed pharmacists. It's important to note, any authorized refills transfer with the original prescription, which means the entire prescription will be filled at the same pharmacy".  Reference: https://www.dea.gov/stories/2023/2023-09/2021-09-01/revised-regulation-allows-dea-registered-pharmacies-transfer (DEA  website announcement)  https://www.govinfo.gov/content/pkg/FR-2021-08-10/pdf/2023-15847.pdf (Federal Register  Department of Justice)   Federal Register / Vol. 88, No. 143 / Thursday, August 10, 2021 / Rules and Regulations DEPARTMENT OF JUSTICE  Drug Enforcement Administration  21 CFR Part 1306  [Docket No. [id number]]  RIN 1117-AB64 Transfer of Electronic Prescriptions for Schedules II-V Controlled Substances Between Pharmacies for Initial Filling  ____________________________________________________________________________________________     _______________________________________________________________________  Medication Rules  Purpose: To inform patients, and their family members, of our medication rules and regulations.  Applies to: All patients receiving prescriptions from our practice (written or electronic).  Pharmacy of record: This is the pharmacy where your electronic prescriptions will be sent. Make sure we have the correct one.  Electronic prescriptions: In compliance with the [place] Strengthen Opioid Misuse Prevention (STOP) Act of 2017 (Session Law 2017-74/H243), effective January 15, 2018, all controlled substances must be electronically prescribed. Written prescriptions, faxing, or calling prescriptions to a pharmacy will no longer be done.  Prescription refills: These will be provided only during in-person appointments. No medications will be renewed without a "face-to-face" evaluation with your provider. Applies to   all prescriptions.  NOTE: The following applies primarily to controlled substances (Opioid* Pain Medications).   Type of encounter (visit): For patients receiving controlled substances, face-to-face visits are required. (Not an option and not up to the patient.)  Patient's responsibilities: Pain Pills: Bring all pain pills to every appointment (except for procedure appointments). Pill Bottles: Bring pills in original pharmacy bottle. Bring  bottle, even if empty. Always bring the bottle of the most recent fill.  Medication refills: You are responsible for knowing and keeping track of what medications you are taking and when is it that you will need a refill. The day before your appointment: write a list of all prescriptions that need to be refilled. The day of the appointment: give the list to the admitting nurse. Prescriptions will be written only during appointments. No prescriptions will be written on procedure days. If you forget a medication: it will not be "Called in", "Faxed", or "electronically sent". You will need to get another appointment to get these prescribed. No early refills. Do not call asking to have your prescription filled early. Partial  or short prescriptions: Occasionally your pharmacy may not have enough pills to fill your prescription.  NEVER ACCEPT a partial fill or a prescription that is short of the total amount of pills that you were prescribed.  With controlled substances the law allows 72 hours for the pharmacy to complete the prescription.  If the prescription is not completed within 72 hours, the pharmacist will require a new prescription to be written. This means that you will be short on your medicine and we WILL NOT send another prescription to complete your original prescription.  Instead, request the pharmacy to send a carrier to a nearby branch to get enough medication to provide you with your full prescription. Prescription Accuracy: You are responsible for carefully inspecting your prescriptions before leaving our office. Have the discharge nurse carefully go over each prescription with you, before taking them home. Make sure that your name is accurately spelled, that your address is correct. Check the name and dose of your medication to make sure it is accurate. Check the number of pills, and the written instructions to make sure they are clear and accurate. Make sure that you are given enough medication  to last until your next medication refill appointment. Taking Medication: Take medication as prescribed. When it comes to controlled substances, taking less pills or less frequently than prescribed is permitted and encouraged. Never take more pills than instructed. Never take the medication more frequently than prescribed.  Inform other Doctors: Always inform, all of your healthcare providers, of all the medications you take. Pain Medication from other Providers: You are not allowed to accept any additional pain medication from any other Doctor or Healthcare provider. There are two exceptions to this rule. (see below) In the event that you require additional pain medication, you are responsible for notifying us, as stated below. Cough Medicine: Often these contain an opioid, such as codeine or hydrocodone. Never accept or take cough medicine containing these opioids if you are already taking an opioid* medication. The combination may cause respiratory failure and death. Medication Agreement: You are responsible for carefully reading and following our Medication Agreement. This must be signed before receiving any prescriptions from our practice. Safely store a copy of your signed Agreement. Violations to the Agreement will result in no further prescriptions. (Additional copies of our Medication Agreement are available upon request.) Laws, Rules, & Regulations: All patients are expected to follow   all Federal and State Laws, Statutes, Rules, & Regulations. Ignorance of the Laws does not constitute a valid excuse.  Illegal drugs and Controlled Substances: The use of illegal substances (including, but not limited to marijuana and its derivatives) and/or the illegal use of any controlled substances is strictly prohibited. Violation of this rule may result in the immediate and permanent discontinuation of any and all prescriptions being written by our practice. The use of any illegal substances is  prohibited. Adopted CDC guidelines & recommendations: Target dosing levels will be at or below 60 MME/day. Use of benzodiazepines** is not recommended.  Exceptions: There are only two exceptions to the rule of not receiving pain medications from other Healthcare Providers. Exception #1 (Emergencies): In the event of an emergency (i.e.: accident requiring emergency care), you are allowed to receive additional pain medication. However, you are responsible for: As soon as you are able, call our office [phone], at any time of the day or night, and leave a message stating your name, the date and nature of the emergency, and the name and dose of the medication prescribed. In the event that your call is answered by a member of our staff, make sure to document and save the date, time, and the name of the person that took your information.  Exception #2 (Planned Surgery): In the event that you are scheduled by another doctor or dentist to have any type of surgery or procedure, you are allowed (for a period no longer than 30 days), to receive additional pain medication, for the acute post-op pain. However, in this case, you are responsible for picking up a copy of our "Post-op Pain Management for Surgeons" handout, and giving it to your surgeon or dentist. This document is available at our office, and does not require an appointment to obtain it. Simply go to our office during business hours (Monday-Thursday from 8:00 AM to 4:00 PM) (Friday 8:00 AM to 12:00 Noon) or if you have a scheduled appointment with us, prior to your surgery, and ask for it by name. In addition, you are responsible for: calling our office [phone], at any time of the day or night, and leaving a message stating your name, name of your surgeon, type of surgery, and date of procedure or surgery. Failure to comply with your responsibilities may result in termination of therapy involving the controlled substances. Medication Agreement  Violation. Following the above rules, including your responsibilities will help you in avoiding a Medication Agreement Violation ("Breaking your Pain Medication Contract").  Consequences:  Not following the above rules may result in permanent discontinuation of medication prescription therapy.  *Opioid medications include: morphine, codeine, oxycodone, oxymorphone, hydrocodone, hydromorphone, meperidine, tramadol, tapentadol, buprenorphine, fentanyl, methadone. **Benzodiazepine medications include: diazepam (Valium), alprazolam (Xanax), clonazepam (Klonopine), lorazepam (Ativan), clorazepate (Tranxene), chlordiazepoxide (Librium), estazolam (Prosom), oxazepam (Serax), temazepam (Restoril), triazolam (Halcion) (Last updated: 11/07/2021) ______________________________________________________________________    ______________________________________________________________________  Medication Recommendations and Reminders  Applies to: All patients receiving prescriptions (written and/or electronic).  Medication Rules & Regulations: You are responsible for reading, knowing, and following our "Medication Rules" document. These exist for your safety and that of others. They are not flexible and neither are we. Dismissing or ignoring them is an act of "non-compliance" that may result in complete and irreversible termination of such medication therapy. For safety reasons, "non-compliance" will not be tolerated. As with the [place] fundamental legal principle of "ignorance of the law is no defense", we will accept no excuses for not having read and   knowing the content of documents provided to you by our practice.  Pharmacy of record:  Definition: This is the pharmacy where your electronic prescriptions will be sent.  We do not endorse any particular pharmacy. It is up to you and your insurance to decide what pharmacy to use.  We do not restrict you in your choice of pharmacy. However, once we write for  your prescriptions, we will NOT be re-sending more prescriptions to fix restricted supply problems created by your pharmacy, or your insurance.  The pharmacy listed in the electronic medical record should be the one where you want electronic prescriptions to be sent. If you choose to change pharmacy, simply notify our nursing staff. Changes will be made only during your regular appointments and not over the phone.  Recommendations: Keep all of your pain medications in a safe place, under lock and key, even if you live alone. We will NOT replace lost, stolen, or damaged medication. We do not accept "Police Reports" as proof of medications having been stolen. After you fill your prescription, take 1 week's worth of pills and put them away in a safe place. You should keep a separate, properly labeled bottle for this purpose. The remainder should be kept in the original bottle. Use this as your primary supply, until it runs out. Once it's gone, then you know that you have 1 week's worth of medicine, and it is time to come in for a prescription refill. If you do this correctly, it is unlikely that you will ever run out of medicine. To make sure that the above recommendation works, it is very important that you make sure your medication refill appointments are scheduled at least 1 week before you run out of medicine. To do this in an effective manner, make sure that you do not leave the office without scheduling your next medication management appointment. Always ask the nursing staff to show you in your prescription , when your medication will be running out. Then arrange for the receptionist to get you a return appointment, at least 7 days before you run out of medicine. Do not wait until you have 1 or 2 pills left, to come in. This is very poor planning and does not take into consideration that we may need to cancel appointments due to bad weather, sickness, or emergencies affecting our staff. DO NOT ACCEPT A  "Partial Fill": If for any reason your pharmacy does not have enough pills/tablets to completely fill or refill your prescription, do not allow for a "partial fill". The law allows the pharmacy to complete that prescription within 72 hours, without requiring a new prescription. If they do not fill the rest of your prescription within those 72 hours, you will need a separate prescription to fill the remaining amount, which we will NOT provide. If the reason for the partial fill is your insurance, you will need to talk to the pharmacist about payment alternatives for the remaining tablets, but again, DO NOT ACCEPT A PARTIAL FILL, unless you can trust your pharmacist to obtain the remainder of the pills within 72 hours.  Prescription refills and/or changes in medication(s):  Prescription refills, and/or changes in dose or medication, will be conducted only during scheduled medication management appointments. (Applies to both, written and electronic prescriptions.) No refills on procedure days. No medication will be changed or started on procedure days. No changes, adjustments, and/or refills will be conducted on a procedure day. Doing so will interfere with the diagnostic portion of   the procedure. No phone refills. No medications will be "called into the pharmacy". No Fax refills. No weekend refills. No Holliday refills. No after hours refills.  Remember:  Business hours are:  Monday to Thursday 8:00 AM to 4:00 PM Provider's Schedule: Hema Lanza, MD - Appointments are:  Medication management: Monday and Wednesday 8:00 AM to 4:00 PM Procedure day: Tuesday and Thursday 7:30 AM to 4:00 PM Bilal Lateef, MD - Appointments are:  Medication management: Tuesday and Thursday 8:00 AM to 4:00 PM Procedure day: Monday and Wednesday 7:30 AM to 4:00 PM (Last update: 11/07/2021) ______________________________________________________________________     ____________________________________________________________________________________________  Drug Holidays  What is a "Drug Holiday"? Drug Holiday: is the name given to the process of slowly tapering down and temporarily stopping the pain medication for the purpose of decreasing or eliminating tolerance to the drug.  Benefits Improved effectiveness Decreased required effective dose Improved pain control End dependence on high dose therapy Decrease cost of therapy Uncovering "opioid-induced hyperalgesia". (OIH)  What is "opioid hyperalgesia"? It is a paradoxical increase in pain caused by exposure to opioids. Stopping the opioid pain medication, contrary to the expected, it actually decreases or completely eliminates the pain. Ref.: "A comprehensive review of opioid-induced hyperalgesia". Marion Lee, et.al. Pain Physician. 2011 Mar-Apr;14(2):145-61.  What is tolerance? Tolerance: the progressive loss of effectiveness of a pain medicine due to repetitive use. A common problem of opioid pain medications.  How long should a "Drug Holiday" last? Effectiveness depends on the patient staying off all opioid pain medicines for a minimum of 14 consecutive days. (2 weeks)  How about just taking less of the medicine? Does not work. Will not accomplish goal of eliminating the excess receptors.  How about switching to a different pain medicine? (AKA. "Opioid rotation") Does not work. Creates the illusion of effectiveness by taking advantage of inaccurate equivalent dose calculations between different opioids. -This "technique" was promoted by studies funded by pharmaceutical companies, such as [employer], creators of "OxyContin".  Can I stop the medicine "cold turkey"? Depends. You should always coordinate with your Pain Specialist to make the transition as smoothly as possible. Avoid stopping the medicine abruptly without consulting. We recommend a "slow taper".  What is a slow  taper? Taper: refers to the gradual decrease in dose.   How do I stop/taper the dose? Slowly. Decrease the daily amount of pills that you take by one (1) pill every seven (7) days. This is called a "slow downward taper". Example: if you normally take four (4) pills per day, drop it to three (3) pills per day for seven (7) days, then to two (2) pills per day for seven (7) days, then to one (1) per day for seven (7) days, and then stop the medicine. The 14 day "Drug Holiday" starts on the first day without medicine.   Will I experience withdrawals? Unlikely with a slow taper.  What triggers withdrawals? Withdrawals are triggered by the sudden/abrupt stop of high dose opioids. Withdrawals can be avoided by slowly decreasing the dose over a prolonged period of time.  What are withdrawals? Symptoms associated with sudden/abrupt reduction/stopping of high-dose, long-term use of pain medication. Withdrawal are seldom seen on low dose therapy, or patients rarely taking opioid medication.  Early Withdrawal Symptoms may include: Agitation Anxiety Muscle aches Increased tearing Insomnia Runny nose Sweating Yawning  Late symptoms may include: Abdominal cramping Diarrhea Dilated pupils Goose bumps Nausea Vomiting  (Last update: 12/24/2021) ____________________________________________________________________________________________    ____________________________________________________________________________________________  WARNING: CBD (cannabidiol) &   Delta (Delta-8 tetrahydrocannabinol) products.   Applicable to:  All individuals currently taking or considering taking CBD (cannabidiol) and, more important, all patients taking opioid analgesic controlled substances (pain medication). (Example: oxycodone; oxymorphone; hydrocodone; hydromorphone; morphine; methadone; tramadol; tapentadol; fentanyl; buprenorphine; butorphanol; dextromethorphan; meperidine; codeine; etc.)  Introduction:   Recently there has been a drive towards the use of "natural" products for the treatment of different conditions, including pain anxiety and sleep disorders. Marijuana and hemp are two varieties of the cannabis genus plants. Marijuana and its derivatives are illegal, while hemp and its derivatives are not. Cannabidiol (CBD) and tetrahydrocannabinol (THC), are two natural compounds found in plants of the Cannabis genus. They can both be extracted from hemp or marijuana. Both compounds interact with your body's endocannabinoid system in very different ways. CBD is associated with pain relief (analgesia) while THC is associated with the psychoactive effects ("the high") obtained from the use of marijuana products. There are two main types of THC: Delta-9, which comes from the marijuana plant and it is illegal, and Delta-8, which comes from the hemp plant, and it is legal. (Both, Delta-9-THC and Delta-8-THC are psychoactive and give you "the high".)   Legality:  Marijuana and its derivatives: illegal Hemp and its derivatives: Legal (State dependent) UPDATE: (03/03/2021) The Drug Enforcement Agency (DEA) issued a letter stating that "delta" cannabinoids, including Delta-8-THCO and Delta-9-THCO, synthetically derived from hemp do not qualify as hemp and will be viewed as Schedule I drugs. (Schedule I drugs, substances, or chemicals are defined as drugs with no currently accepted medical use and a high potential for abuse. Some examples of Schedule I drugs are: heroin, lysergic acid diethylamide (LSD), marijuana (cannabis), 3,4-methylenedioxymethamphetamine (ecstasy), methaqualone, and peyote.) (https://www.dea.gov)  Legal status of CBD in [place]:  "Conditionally Legal"  Reference: "FDA Regulation of Cannabis and Cannabis-Derived Products, Including Cannabidiol (CBD)" - https://www.fda.gov/news-events/public-health-focus/fda-regulation-cannabis-and-cannabis-derived-products-including-cannabidiol-cbd  Warning:   CBD is not FDA approved and has not undergo the same manufacturing controls as prescription drugs.  This means that the purity and safety of available CBD may be questionable. Most of the time, despite manufacturer's claims, it is contaminated with THC (delta-9-tetrahydrocannabinol - the chemical in marijuana responsible for the "HIGH").  When this is the case, the THC contaminant will trigger a positive urine drug screen (UDS) test for Marijuana (carboxy-THC).   The FDA recently put out a warning about 5 things that everyone should be aware of regarding Delta-8 THC: Delta-8 THC products have not been evaluated or approved by the FDA for safe use and may be marketed in ways that put the public health at risk. The FDA has received adverse event reports involving delta-8 THC-containing products. Delta-8 THC has psychoactive and intoxicating effects. Delta-8 THC manufacturing often involve use of potentially harmful chemicals to create the concentrations of delta-8 THC claimed in the marketplace. The final delta-8 THC product may have potentially harmful by-products (contaminants) due to the chemicals used in the process. Manufacturing of delta-8 THC products may occur in uncontrolled or unsanitary settings, which may lead to the presence of unsafe contaminants or other potentially harmful substances. Delta-8 THC products should be kept out of the reach of children and pets.  NOTE: Because a positive UDS for any illicit substance is a violation of our medication agreement, your opioid analgesics (pain medicine) may be permanently discontinued.  MORE ABOUT CBD  General Information: CBD was discovered in 1940 and it is a derivative of the cannabis sativa genus plants (Marijuana and Hemp). It is one of the 113 identified   substances found in Marijuana. It accounts for up to 40% of the plant's extract. As of 2018, preliminary clinical studies on CBD included research for the treatment of anxiety, movement  disorders, and pain. CBD is available and consumed in multiple forms, including inhalation of smoke or vapor, as an aerosol spray, and by mouth. It may be supplied as an oil containing CBD, capsules, dried cannabis, or as a liquid solution. CBD is thought not to be as psychoactive as THC (delta-9-tetrahydrocannabinol - the chemical in marijuana responsible for the "HIGH"). Studies suggest that CBD may interact with different biological target receptors in the body, including cannabinoid and other neurotransmitter receptors. As of 2018 the mechanism of action for its biological effects has not been determined.  Side-effects  Adverse reactions: Dry mouth, diarrhea, decreased appetite, fatigue, drowsiness, malaise, weakness, sleep disturbances, and others.  Drug interactions:  CBD may interact with medications such as blood-thinners. CBD causes drowsiness on its own and it will increase drowsiness caused by other medications, including antihistamines (such as Benadryl), benzodiazepines (Xanax, Ativan, Valium), antipsychotics, antidepressants, opioids, alcohol and supplements such as kava, melatonin and St. John's Wort.  Other drug interactions: Brivaracetam (Briviact); Caffeine; Carbamazepine (Tegretol); Citalopram (Celexa); Clobazam (Onfi); Eslicarbazepine (Aptiom); Everolimus (Zostress); Lithium; Methadone (Dolophine); Rufinamide (Banzel); Sedative medications (CNS depressants); Sirolimus (Rapamune); Stiripentol (Diacomit); Tacrolimus (Prograf); Tamoxifen ; Soltamox); Topiramate (Topamax); Valproate; Warfarin (Coumadin); Zonisamide. (Last update: 12/25/2021) ____________________________________________________________________________________________   ____________________________________________________________________________________________  Naloxone Nasal Spray  Why am I receiving this medication? [place] STOP ACT requires that all patients taking high dose opioids or at risk of opioids  respiratory depression, be prescribed an opioid reversal agent, such as Naloxone (AKA: Narcan).  What is this medication? NALOXONE (nal OX one) treats opioid overdose, which causes slow or shallow breathing, severe drowsiness, or trouble staying awake. Call emergency services after using this medication. You may need additional treatment. Naloxone works by reversing the effects of opioids. It belongs to a group of medications called opioid blockers.  COMMON BRAND NAME(S): Kloxxado, Narcan  What should I tell my care team before I take this medication? They need to know if you have any of these conditions: Heart disease Substance use disorder An unusual or allergic reaction to naloxone, other medications, foods, dyes, or preservatives Pregnant or trying to get pregnant Breast-feeding  When to use this medication? This medication is to be used for the treatment of respiratory depression (less than 8 breaths per minute) secondary to opioid overdose.   How to use this medication? This medication is for use in the nose. Lay the person on their back. Support their neck with your hand and allow the head to tilt back before giving the medication. The nasal spray should be given into 1 nostril. After giving the medication, move the person onto their side. Do not remove or test the nasal spray until ready to use. Get emergency medical help right away after giving the first dose of this medication, even if the person wakes up. You should be familiar with how to recognize the signs and symptoms of a narcotic overdose. If more doses are needed, give the additional dose in the other nostril. Talk to your care team about the use of this medication in children. While this medication may be prescribed for children as young as newborns for selected conditions, precautions do apply.  Naloxone Overdosage: If you think you have taken too much of this medicine contact a poison control center or emergency room at  once.  NOTE: This medicine   is only for you. Do not share this medicine with others.  What if I miss a dose? This does not apply.  What may interact with this medication? This is only used during an emergency. No interactions are expected during emergency use. This list may not describe all possible interactions. Give your health care provider a list of all the medicines, herbs, non-prescription drugs, or dietary supplements you use. Also tell them if you smoke, drink alcohol, or use illegal drugs. Some items may interact with your medicine.  What should I watch for while using this medication? Keep this medication ready for use in the case of an opioid overdose. Make sure that you have the phone number of your care team and local hospital ready. You may need to have additional doses of this medication. Each nasal spray contains a single dose. Some emergencies may require additional doses. After use, bring the treated person to the nearest hospital or call 911. Make sure the treating care team knows that the person has received a dose of this medication. You will receive additional instructions on what to do during and after use of this medication before an emergency occurs.  What side effects may I notice from receiving this medication? Side effects that you should report to your care team as soon as possible: Allergic reactions--skin rash, itching, hives, swelling of the face, lips, tongue, or throat Side effects that usually do not require medical attention (report these to your care team if they continue or are bothersome): Constipation Dryness or irritation inside the nose Headache Increase in blood pressure Muscle spasms Stuffy nose Toothache This list may not describe all possible side effects. Call your doctor for medical advice about side effects. You may report side effects to FDA at 1-800-FDA-1088.  Where should I keep my medication? Because this is an emergency medication, you  should keep it with you at all times.  Keep out of the reach of children and pets. Store between 20 and 25 degrees C (68 and 77 degrees F). Do not freeze. Throw away any unused medication after the expiration date. Keep in original box until ready to use.  NOTE: This sheet is a summary. It may not cover all possible information. If you have questions about this medicine, talk to your doctor, pharmacist, or health care provider.   2023 Elsevier/Gold Standard (2020-09-09 00:00:00)  ____________________________________________________________________________________________   ____________________________________________________________________________________________  Patient Information update  To: All of our patients.  Re: Name change.  It has been made official that our current name, "[facility]"   will soon be changed to "[facility]".   The purpose of this change is to eliminate any confusion created by the concept of our practice being a "Medication Management Pain Clinic". In the past this has led to the misconception that we treat pain primarily by the use of prescription medications.  Nothing can be farther from the truth.   Understanding PAIN MANAGEMENT: To further understand what our practice does, you first have to understand that "Pain Management" is a subspecialty that requires additional training once a physician has completed their specialty training, which can be in either Anesthesia, Neurology, Psychiatry, or Physical Medicine and Rehabilitation (PMR). Each one of these contributes to the final approach taken by each physician to the management of their patient's pain. To be a "Pain Management Specialist" you must have first completed one of the specialty trainings below.  Anesthesiologists -   trained in clinical pharmacology and interventional techniques such as nerve  blockade and regional as well as central neuroanatomy. They are trained to block pain before, during, and after surgical interventions.  Neurologists - trained in the diagnosis and pharmacological treatment of complex neurological conditions, such as Multiple Sclerosis, Parkinson's, spinal cord injuries, and other systemic conditions that may be associated with symptoms that may include but are not limited to pain. They tend to rely primarily on the treatment of chronic pain using prescription medications.  Psychiatrist - trained in conditions affecting the psychosocial wellbeing of patients including but not limited to depression, anxiety, schizophrenia, personality disorders, addiction, and other substance use disorders that may be associated with chronic pain. They tend to rely primarily on the treatment of chronic pain using prescription medications.   Physical Medicine and Rehabilitation (PMR) physicians, also known as physiatrists - trained to treat a wide variety of medical conditions affecting the brain, spinal cord, nerves, bones, joints, ligaments, muscles, and tendons. Their training is primarily aimed at treating patients that have suffered injuries that have caused severe physical impairment. Their training is primarily aimed at the physical therapy and rehabilitation of those patients. They may also work alongside orthopedic surgeons or neurosurgeons using their expertise in assisting surgical patients to recover after their surgeries.  INTERVENTIONAL PAIN MANAGEMENT is sub-subspecialty of Pain Management.  Our physicians are Board-certified in Anesthesia, Pain Management, and Interventional Pain Management.  This meaning that not only have they been trained and Board-certified in their specialty of Anesthesia, and subspecialty of Pain Management, but they have also received further training in the sub-subspecialty of Interventional Pain Management, in order to become Board-certified as  INTERVENTIONAL PAIN MANAGEMENT SPECIALIST.    Mission: Our goal is to use our skills in  INTERVENTIONAL PAIN MANAGEMENT as alternatives to the chronic use of prescription opioid medications for the treatment of pain. To make this more clear, we have changed our name to reflect what we do and offer. We will continue to offer medication management assessment and recommendations, but we will not be taking over any patient's medication management.  ____________________________________________________________________________________________     

## 2022-02-18 NOTE — Progress Notes (Unsigned)
PROVIDER NOTE: Information contained herein reflects review and annotations entered in association with encounter. Interpretation of such information and data should be left to medically-trained personnel. Information provided to patient can be located elsewhere in the medical record under "Patient Instructions". Document created using STT-dictation technology, any transcriptional errors that may result from process are unintentional.    Patient: Wendy Valencia  Service Category: E/M  Provider: Gaspar Cola, MD  DOB: 1965-06-25  DOS: 02/19/2022  Referring Provider: Unk Pinto, MD  MRN: [id number]  Specialty: Interventional Pain Management  PCP: Unk Pinto, MD  Type: Established Patient  Setting: Ambulatory outpatient    Location: Office  Delivery: Face-to-face     HPI  Wendy Valencia, a 57 y.o. year old female, is here today because of her Chronic pain syndrome [G89.4]. Wendy Valencia's primary complain today is No chief complaint on file. Last encounter: My last encounter with her was on 08/28/2021. Pertinent problems: [name] has Cervical spondylosis without myelopathy; Fibromyalgia affecting multiple sites (2ry area of Pain); Spondylosis of lumbar spine; Chronic generalized pain (1ry area of Pain); Chronic pain syndrome; Chronic shoulder pain (3ry area of Pain) (Bilateral) (R>L); Chronic hip pain (4th area of Pain) (Bilateral) (R>L); Greater trochanteric bursitis of hips (Bilateral); Osteoarthritis of hips (Bilateral); Lumbar facet syndrome (Right); Spondylosis without myelopathy or radiculopathy, lumbosacral region; Chronic hip pain (Left); and Chronic low back pain (Right) w/o sciatica on their pertinent problem list. Pain Assessment: Severity of   is reported as a  /10. Location:    / . Onset:  . Quality:  . Timing:  . Modifying factor(s):  Marland Kitchen Vitals:  vitals were not taken for this visit.  BMI: Estimated body mass index is 28.08 kg/m as calculated from the  following:   Height as of 12/13/21: 5\' 6"  (1.676 m).   Weight as of 12/13/21: 174 lb (78.9 kg).  Reason for encounter: medication management. ***  UDS  R 08/30/2022   Pharmacotherapy Assessment  Analgesic: Tramadol 50 mg 2 tablets twice daily (200 mg/day of tramadol) MME/day: 20 mg/day.   Monitoring: [place] PMP: PDMP reviewed during this encounter.       Pharmacotherapy: No side-effects or adverse reactions reported. Compliance: No problems identified. Effectiveness: Clinically acceptable.  No notes on file  CBD:THC Ratio  Date Value Ref Range Status  02/27/2021 Negative RATIO Final    Comment:    INTERPRETATION: Neither CBD nor marijuana or marijuana/THC products detected in this sample.  This test measures Cannabidiol (CBD) and Tetrahydrocannabinol (THC) and metabolites in urine. The CBD:THC ratio is calculated using the sums of the respective metabolites and is intended to assist in differentiating the presence of Tetrahydrocannabinol [facility]) metabolites due to the use of marijuana or medicinal THC from the presence of THC metabolites due to use of Cannabidiol (CBD) or hemp products that purportedly contain trace amounts of THC.  CBD:THC Ratio          Interpretation ---------      --------------------------------------------- >=10.0         Consistent with the use of CBD products only 1.0 - 9.9      Indeterminate <1.0           Consistent with marijuana, medicinal THC or                mixed use  Interpretive ranges are provided as guidance and should not be considered definitive. Interpretation of results should include consideration of all relevant clinical and diagnostic information.  Analysis  performed by chromatography with mass spectrometry. This test was developed and its performance characteristics determined by [facility].  It has not been cleared or approved by the Food and Drug Administration.    Carboxy-Delta-8-THC  Date Value Ref Range Status   02/27/2021 Not Detected ng/mL Final   Carboxy-Delta-9-THC  Date Value Ref Range Status  02/27/2021 Not Detected ng/mL Final    Comment:    Carboxy-Delta-9-THC is the primary metabolite of Delta-9- Tetrahydrocannabinol. Sources include the prescription medication Dronabinol as well as illicit, recreational, and medical marijuana and marijuana derived products of the same categories.  Carboxy-Delta-8-THC is the primary metabolite of Delta-8- Tetrahydrocannabinol. Sources are products containing Delta- 8-THC, which is primarily chemically manufactured from cannabidiol (CBD).  Testing Threshold = 2.0 ng/mL  Analysis performed by Liquid Chromatography with Tandem Mass Spectrometry (LC/MS/MS).  This test was developed and its performance characteristics determined by [facility].  It has not been cleared or approved by the Food and Drug Administration.     UDS:  Summary  Date Value Ref Range Status  02/27/2021 Note  Final    Comment:    ==================================================================== [employer]ox[employer] Select 13 (MW) ==================================================================== Test                             Result       Flag       Units  Drug Present and Declared for Prescription Verification   Tramadol                       >6024        EXPECTED   ng/mg creat   O-Desmethyltramadol            >6024        EXPECTED   ng/mg creat   N-Desmethyltramadol            >6024        EXPECTED   ng/mg creat    Source of tramadol is a prescription medication. O-desmethyltramadol    and N-desmethyltramadol are expected metabolites of tramadol.  ==================================================================== Test                      Result    Flag   Units      Ref Range   Creatinine              83               mg/dL      >=20 ==================================================================== Declared Medications:  The flagging and interpretation on this report  are based on the  following declared medications.  Unexpected results may arise from  inaccuracies in the declared medications.   **Note: The testing scope of this panel includes these medications:   Tramadol (Ultram)   **Note: The testing scope of this panel does not include the  following reported medications:   Acetaminophen  Albuterol  Aspirin  Betamethasone  Caffeine  Cetirizine (Zyrtec-D)  Chondroitin  Diphenhydramine (Benadryl)  Duloxetine (Cymbalta)  Gabapentin (Neurontin)  Glucosamine  Hydrochlorothiazide (Hyzaar)  Iron  Losartan (Hyzaar)  Multivitamin  Probiotic  Pseudoephedrine (Zyrtec-D)  Quetiapine (Seroquel)  Semaglutide (Wegovy)  Sennosides (Senna)  Valacyclovir (Valtrex)  Vitamin B  Vitamin C  Vitamin D2 (Drisdol) ==================================================================== For clinical consultation, please call [phone]. ====================================================================       ROS  Constitutional: Denies any fever or chills Gastrointestinal: No reported hemesis, hematochezia, vomiting, or acute GI  distress Musculoskeletal: Denies any acute onset joint swelling, redness, loss of ROM, or weakness Neurological: No reported episodes of acute onset apraxia, aphasia, dysarthria, agnosia, amnesia, paralysis, loss of coordination, or loss of consciousness  Medication Review  Albuterol Sulfate, Aspirin-Acetaminophen-Caffeine, DULoxetine, Fusion Plus, Glucosamine-Chondroitin, Insulin Pen Needle, QUEtiapine, Semaglutide-Weight Management, Vitamin D (Ergocalciferol), amoxicillin-clavulanate, betamethasone dipropionate, cetirizine-pseudoephedrine, diphenhydrAMINE, gabapentin, lactulose, losartan-hydrochlorothiazide, lubiprostone, multivitamin with minerals, omeprazole, traMADol, and valACYclovir  History Review  Allergy: Ms. Sleep has No Known Allergies. Drug: Ms. Greb  reports no history of drug use. Alcohol:   reports current alcohol use of about 1.0 standard drink of alcohol per week. Tobacco:  reports that she has been smoking cigarettes. She started smoking about 33 years ago. She has a 4.65 pack-year smoking history. She has never used smokeless tobacco. Social: Ms. Thackeray  reports that she has been smoking cigarettes. She started smoking about 33 years ago. She has a 4.65 pack-year smoking history. She has never used smokeless tobacco. She reports current alcohol use of about 1.0 standard drink of alcohol per week. She reports that she does not use drugs. Medical:  has a past medical history of Allergy, Anemia, Anxiety, Arthritis, Fibromyalgia, GERD (gastroesophageal reflux disease), Hypertension, IBS (irritable bowel syndrome), Nephrolithiasis, and Plantar fasciitis of right foot. Surgical: Ms. Berke  has a past surgical history that includes Left shoulder arthroscopic surgery (Left); tummy tuck (2005); Breath tek h pylori (07/11/2011); Gastric Roux-En-Y (10/01/2011); Cholecystectomy (10/01/2011); and Colonoscopy. Family: family history includes Breast cancer (age of onset: 55) in her mother; Breast cancer (age of onset: 32) in her maternal aunt; Cancer in her maternal grandmother; Cancer (age of onset: 14) in her mother; Heart attack in her maternal grandfather and paternal grandfather; Heart disease in her father and maternal grandfather; Hypertension in her father; Lung cancer in her maternal grandmother; Multiple sclerosis in her maternal uncle; Obesity in her brother; Stroke in her father.  Laboratory Chemistry Profile   Renal Lab Results  Component Value Date   BUN 15 09/20/2021   CREATININE 0.77 09/20/2021   LABCREA 120 09/20/2021   BCR SEE NOTE: 09/20/2021   GFRAA 113 12/14/2019   GFRNONAA 97 12/14/2019    Hepatic Lab Results  Component Value Date   AST 17 09/20/2021   ALT 12 09/20/2021   ALBUMIN 4.5 03/05/2016   ALKPHOS 123 (H) 03/05/2016    Electrolytes Lab Results   Component Value Date   NA 140 09/20/2021   K 3.9 09/20/2021   CL 102 09/20/2021   CALCIUM 9.4 09/20/2021   MG 2.4 09/20/2021    Bone Lab Results  Component Value Date   VD25OH 44 09/20/2021    Inflammation (CRP: Acute Phase) (ESR: Chronic Phase) Lab Results  Component Value Date   CRP 0.9 03/04/2017   ESRSEDRATE 22 (H) 03/04/2017         Note: Above Lab results reviewed.  Recent Imaging Review  US PELVIS TRANSVAGINAL NON-OB (TV ONLY) Indication:  pelvic pain.  Findings: Pelvic US Uterus 7.15 x 4.27 x 3.01 cm.  No myometrial masses. EMS 1.98 mm.   Left ovary 3.64 x 2.32 x 1.58 cm.   Atrophic  Right ovary 3.33 x 1.78 x 1.53 cm.   Atrophic.  No adnexal masses.  No free fluid.   Impression:  normal pelvic ultrasound.  Note: Reviewed        Physical Exam  General appearance: Well nourished, well developed, and well hydrated. In no apparent acute distress Mental status: Alert, oriented x 3 (person, place, &  time)       Respiratory: No evidence of acute respiratory distress Eyes: PERLA Vitals: LMP  (LMP Unknown)  BMI: Estimated body mass index is 28.08 kg/m as calculated from the following:   Height as of 12/13/21: 5\' 6"  (1.676 m).   Weight as of 12/13/21: 174 lb (78.9 kg). Ideal: Patient weight not recorded  Assessment   Diagnosis Status  1. Chronic pain syndrome   2. Chronic generalized pain (1ry area of Pain)   3. Fibromyalgia affecting multiple sites (2ry area of Pain)   4. Chronic shoulder pain (3ry area of Pain) (Bilateral) (R>L)   5. Chronic hip pain (4th area of Pain) (Bilateral) (R>L)   6. Osteoarthritis of hips (Bilateral)   7. Pharmacologic therapy   8. Chronic use of opiate for therapeutic purpose   9. Encounter for medication management   10. Encounter for chronic pain management    Controlled Controlled Controlled   Updated Problems: No problems updated.  Plan of Care  Problem-specific:  No problem-specific Assessment & Plan notes found  for this encounter.  Ms. Miguel Medal Poppe has a current medication list which includes the following long-term medication(s): duloxetine, duloxetine, gabapentin, losartan-hydrochlorothiazide, omeprazole, quetiapine, and tramadol.  Pharmacotherapy (Medications Ordered): No orders of the defined types were placed in this encounter.  Orders:  No orders of the defined types were placed in this encounter.  Follow-up plan:   No follow-ups on file.     Interventional Therapies  Risk  Complexity Considerations:   WNL   Planned  Pending:      Under consideration:      Completed:   Diagnostic bilateral IA hip joint inj. x1 (06/25/2017) (100/100/70/75-100)  Diagnostic bilateral superficial trochanteric bursa inj. x1 (06/25/2017) (100/100/70/75-100)    Therapeutic  Palliative (PRN) options:   Therapeutic bilateral hip & TB inj. #2      Recent Visits No visits were found meeting these conditions. Showing recent visits within past 90 days and meeting all other requirements Future Appointments Date Type Provider Dept  02/19/22 Appointment Milinda Pointer, MD [facility]  Showing future appointments within next 90 days and meeting all other requirements  I discussed the assessment and treatment plan with the patient. The patient was provided an opportunity to ask questions and all were answered. The patient agreed with the plan and demonstrated an understanding of the instructions.  Patient advised to call back or seek an in-person evaluation if the symptoms or condition worsens.  Duration of encounter: *** minutes.  Total time on encounter, as per AMA guidelines included both the face-to-face and non-face-to-face time personally spent by the physician and/or other qualified health care professional(s) on the day of the encounter (includes time in activities that require the physician or other qualified health care professional and does not include time in activities  normally performed by clinical staff). Physician's time may include the following activities when performed: Preparing to see the patient (e.g., pre-charting review of records, searching for previously ordered imaging, lab work, and nerve conduction tests) Review of prior analgesic pharmacotherapies. Reviewing PMP Interpreting ordered tests (e.g., lab work, imaging, nerve conduction tests) Performing post-procedure evaluations, including interpretation of diagnostic procedures Obtaining and/or reviewing separately obtained history Performing a medically appropriate examination and/or evaluation Counseling and educating the patient/family/caregiver Ordering medications, tests, or procedures Referring and communicating with other health care professionals (when not separately reported) Documenting clinical information in the electronic or other health record Independently interpreting results (not separately reported) and communicating results to  the patient/ family/caregiver Care coordination (not separately reported)  Note by: Oswaldo Done, MD Date: 02/19/2022; Time: 12:53 PM

## 2022-02-19 ENCOUNTER — Ambulatory Visit: Payer: BC Managed Care – PPO | Attending: Pain Medicine | Admitting: Pain Medicine

## 2022-02-19 ENCOUNTER — Encounter: Payer: Self-pay | Admitting: Pain Medicine

## 2022-02-19 VITALS — BP 130/88 | HR 79 | Temp 97.6°F | Resp 18 | Ht 67.5 in | Wt 170.0 lb

## 2022-02-19 DIAGNOSIS — M25552 Pain in left hip: Secondary | ICD-10-CM | POA: Diagnosis not present

## 2022-02-19 DIAGNOSIS — M160 Bilateral primary osteoarthritis of hip: Secondary | ICD-10-CM | POA: Insufficient documentation

## 2022-02-19 DIAGNOSIS — Z79899 Other long term (current) drug therapy: Secondary | ICD-10-CM | POA: Diagnosis not present

## 2022-02-19 DIAGNOSIS — G8929 Other chronic pain: Secondary | ICD-10-CM | POA: Diagnosis not present

## 2022-02-19 DIAGNOSIS — M797 Fibromyalgia: Secondary | ICD-10-CM

## 2022-02-19 DIAGNOSIS — M25551 Pain in right hip: Secondary | ICD-10-CM | POA: Insufficient documentation

## 2022-02-19 DIAGNOSIS — G894 Chronic pain syndrome: Secondary | ICD-10-CM | POA: Diagnosis not present

## 2022-02-19 DIAGNOSIS — M25511 Pain in right shoulder: Secondary | ICD-10-CM | POA: Diagnosis not present

## 2022-02-19 DIAGNOSIS — R52 Pain, unspecified: Secondary | ICD-10-CM | POA: Insufficient documentation

## 2022-02-19 DIAGNOSIS — M25512 Pain in left shoulder: Secondary | ICD-10-CM | POA: Insufficient documentation

## 2022-02-19 DIAGNOSIS — Z79891 Long term (current) use of opiate analgesic: Secondary | ICD-10-CM | POA: Insufficient documentation

## 2022-02-19 MED ORDER — TRAMADOL HCL 50 MG PO TABS: 50.0000 mg | ORAL_TABLET | Freq: Four times a day (QID) | ORAL | 5 refills | Status: DC | PRN

## 2022-02-19 NOTE — Progress Notes (Signed)
Safety precautions to be maintained throughout the outpatient stay will include: orient to surroundings, keep bed in low position, maintain call bell within reach at all times, provide assistance with transfer out of bed and ambulation.   Nursing Pain Medication Assessment:  Safety precautions to be maintained throughout the outpatient stay will include: orient to surroundings, keep bed in low position, maintain call bell within reach at all times, provide assistance with transfer out of bed and ambulation.  Medication Inspection Compliance: Pill count conducted under aseptic conditions, in front of the patient. Neither the pills nor the bottle was removed from the patient's sight at any time. Once count was completed pills were immediately returned to the patient in their original bottle.  Medication: Tramadol (Ultram) Pill/Patch Count:  57 of 120 pills remain Pill/Patch Appearance: Markings consistent with prescribed medication Bottle Appearance: Standard pharmacy container. Clearly labeled. Filled Date: 01 / 17 / 2024 Last Medication intake:  Today

## 2022-02-20 DIAGNOSIS — Z79891 Long term (current) use of opiate analgesic: Secondary | ICD-10-CM | POA: Diagnosis not present

## 2022-02-20 DIAGNOSIS — F1120 Opioid dependence, uncomplicated: Secondary | ICD-10-CM | POA: Diagnosis not present

## 2022-02-20 DIAGNOSIS — G894 Chronic pain syndrome: Secondary | ICD-10-CM | POA: Diagnosis not present

## 2022-02-20 DIAGNOSIS — Z79899 Other long term (current) drug therapy: Secondary | ICD-10-CM | POA: Diagnosis not present

## 2022-02-23 LAB — TOXASSURE SELECT 13 (MW), URINE

## 2022-02-24 ENCOUNTER — Other Ambulatory Visit: Payer: Self-pay | Admitting: Nurse Practitioner

## 2022-02-26 ENCOUNTER — Inpatient Hospital Stay: Payer: BC Managed Care – PPO

## 2022-02-26 ENCOUNTER — Inpatient Hospital Stay: Payer: BC Managed Care – PPO | Admitting: Licensed Clinical Social Worker

## 2022-02-26 ENCOUNTER — Telehealth: Payer: Self-pay

## 2022-02-26 NOTE — Telephone Encounter (Signed)
Prior authorization for [facility] completed and submitted to CoverMyMeds

## 2022-02-27 ENCOUNTER — Encounter: Payer: Self-pay | Admitting: Nurse Practitioner

## 2022-02-28 NOTE — Telephone Encounter (Signed)
PA approved for [facility]

## 2022-03-03 ENCOUNTER — Other Ambulatory Visit: Payer: Self-pay | Admitting: Nurse Practitioner

## 2022-03-03 DIAGNOSIS — M797 Fibromyalgia: Secondary | ICD-10-CM

## 2022-03-10 ENCOUNTER — Other Ambulatory Visit: Payer: Self-pay | Admitting: Nurse Practitioner

## 2022-03-10 DIAGNOSIS — M797 Fibromyalgia: Secondary | ICD-10-CM

## 2022-03-10 DIAGNOSIS — I10 Essential (primary) hypertension: Secondary | ICD-10-CM

## 2022-03-18 ENCOUNTER — Other Ambulatory Visit: Payer: Self-pay | Admitting: Nurse Practitioner

## 2022-03-18 DIAGNOSIS — B001 Herpesviral vesicular dermatitis: Secondary | ICD-10-CM

## 2022-03-29 ENCOUNTER — Encounter: Payer: Self-pay | Admitting: Gastroenterology

## 2022-03-29 ENCOUNTER — Ambulatory Visit: Payer: BC Managed Care – PPO | Admitting: Nurse Practitioner

## 2022-03-29 NOTE — Progress Notes (Signed)
Assessment and Plan:  Wendy Valencia was seen today for follow-up.  Diagnoses and all orders for this visit:  Essential hypertension - continue medications, DASH diet, exercise and monitor at home. Call if greater than 130/80.   -     CBC with Differential/Platelet  Hyperlipidemia Continue diet and exercise - Check lipid and CMP - Check TSH  Fibromyalgia Continue to follow with pain clinic for Tramadol Continue medications Cymbalta and Gabapentin, diet and exercise Continue to monitor symptoms  Overweight (BMI 25.0-29.9)  Continue Wegovy, diet and exercise  Would like to switch to Zepbound- will call her insurance for coverage  Vitamin D deficiency  Continue Vit D supplementation  S/P gastric bypass -     COMPLETE METABOLIC PANEL WITH GFR  Anxiety state Controlled with cymbalta Continue diet and exercise  Cold sores Continue Valtrex as needed  Drug Induced Constipation Encouraged to increase water consumption Lactulose 20g bid as needed Increase fiber intake  Abnormal glucose Continue diet and exercise -A1c  Medication management -     CBC with Differential/Platelet -     COMPLETE METABOLIC PANEL WITH GFR -     Lipid panel -     Hemoglobin A1c  Eczema, unspecified type -     betamethasone dipropionate (DIPROLENE) 0.05 % ointment; Apply topically 2 (two) times daily.     Further disposition pending results of labs. Discussed med's effects and SE's.   Over 30 minutes of exam, counseling, chart review, and critical decision making was performed.   Future Appointments  Date Time Provider [place]  04/23/2022  2:00 PM Ignacia Bayley [facility] None  04/23/2022  3:00 PM [facility] None  08/22/2022 10:40 AM Milinda Pointer, MD [facility] None  09/24/2022  3:00 PM Alycia Rossetti, NP [facility] None    ------------------------------------------------------------------------------------------------------------------   HPI BP 128/80    Pulse 93   Temp 97.9 F (36.6 C)   Resp 16   Ht 5' 7.5" (1.715 m)   Wt 171 lb 12.8 oz (77.9 kg)   LMP  (LMP Unknown)   SpO2 96%   BMI 26.51 kg/m  57 y.o.female presents for 3 month recheck on fibromyalgia, hypertension, anxiety, overweight  She is following with pain management.  She is unable to increase tramadol for pain control and they recommended increasing Gabapentin. She is currently taking Gabapentin 600 mg TID and 900 mg at bedtime.She is also on Seroquel 25 mg 3 tabs at bedtime for sleep.   She is having a lot of constipation related to pain medication, gastric bypass and Wegovy. She has left sided stomach pain. She is using magnesium for constipation and is working well.  She has been having more regular bowel movements.   She is having implants and does get cold sores after visits.  Valtrex does help to control.  She will get eczema patches on her wrists and behind her ears  She has been evaluated by cardiology for previous prolonged QT on EKG    Currently taking Losartan 100/25 mg. BP well controlled BP Readings from Last 3 Encounters:  03/30/22 128/80  02/19/22 130/88  12/13/21 124/80    Recent mammogram 05/25/21- negative repeat 1 year    BMI is Body mass index is 26.51 kg/m., she has been working on diet and exercise. She is on Wegovy 2.4 mg SQ QW . She is s/p gastric bypass 2014. She is doing yoga , walking.  Feels less pain on days she is walking.  Wt Readings from Last 3  Encounters:  03/30/22 171 lb 12.8 oz (77.9 kg)  02/19/22 170 lb (77.1 kg)  12/13/21 174 lb (78.9 kg)   She is not on cholesterol medication and cholesterol is currently controlled with diet and exercise  Lab Results  Component Value Date   CHOL 160 09/20/2021   HDL 71 09/20/2021   LDLCALC 72 09/20/2021   TRIG 87 09/20/2021   CHOLHDL 2.3 09/20/2021    She has a history of abnormal glucose. Has been using diet and exercise.  Weight loss has improved her A1c- last value was: Lab Results   Component Value Date   HGBA1C 5.4 09/20/2021     Past Medical History:  Diagnosis Date   Allergy    Anemia    Anxiety    Arthritis    Fibromyalgia    GERD (gastroesophageal reflux disease)    Hypertension    IBS (irritable bowel syndrome)    Nephrolithiasis    Plantar fasciitis of right foot    wears boot      No Known Allergies  Current Outpatient Medications on File Prior to Visit  Medication Sig   Albuterol Sulfate (PROVENTIL HFA IN) Inhale 108 mcg into the lungs as needed.   Aspirin-Acetaminophen-Caffeine (EXCEDRIN EXTRA STRENGTH PO) Take 1 tablet by mouth 3 (three) times daily as needed (headache).    cetirizine-pseudoephedrine (ZYRTEC-D) 5-120 MG tablet Take 1 tablet by mouth every 12 (twelve) hours as needed for allergies.    diphenhydrAMINE (BENADRYL) 25 MG tablet Take 25-50 mg by mouth 3 (three) times daily as needed for allergies.   DULoxetine (CYMBALTA) 30 MG capsule TAKE ONE CAPSULE BY MOUTH DAILY WITH 60MG    DULoxetine (CYMBALTA) 60 MG capsule TAKE ONE CAPSULE BY MOUTH DAILY WITH THE 30MG    gabapentin (NEURONTIN) 600 MG tablet TAKE ONE TABLET BY MOUTH THREE TIMES A DAY, THEN TAKE 1 AND 1/2 TABLET BY MOUTH EVERY NIGHT AT BEDTIME   Glucosamine-Chondroitin (COSAMIN DS PO) Take 1,500 mg by mouth daily.   IBU 600 MG tablet Take 600 mg by mouth every 6 (six) hours.   Iron-FA-B Cmp-C-Biot-Probiotic (FUSION PLUS) CAPS Take 1 cap every 2-3 days as tolerated for chronic iron deficiency.   lactulose (CHRONULAC) 10 GM/15ML solution TAKE 30MLS BY MOUTH TWO TIMES A DAY   losartan-hydrochlorothiazide (HYZAAR) 100-25 MG tablet TAKE ONE TABLET BY MOUTH DAILY   Magnesium 400 MG CAPS Take by mouth.   Multiple Vitamins-Minerals (MULTIVITAMIN WITH MINERALS) tablet Take 1 tablet by mouth daily. Marland Kitchen   NOVOFINE 32G X 6 MM MISC Needs daily for victoza   QUEtiapine (SEROQUEL) 25 MG tablet TAKE 3 TABLETS BY MOUTH EVERY NIGHT AT BEDTIME   Semaglutide-Weight Management (WEGOVY) 2.4 MG/0.75ML  SOAJ INJECT 2.4 MG UNDER THE SKIN ONCE WEEKLY   traMADol (ULTRAM) 50 MG tablet Take 1 tablet (50 mg total) by mouth every 6 (six) hours as needed for severe pain. Each refill must last 30 days.   valACYclovir (VALTREX) 500 MG tablet TAKE 1 TABLET BY MOUTH DAILY AS NEEDED FOR COLD SORE   Vitamin D, Ergocalciferol, (DRISDOL) 1.25 MG (50000 UNIT) CAPS capsule TAKE 1 CAPSULE TWO TIMES A WEEK   amoxicillin-clavulanate (AUGMENTIN) 875-125 MG tablet Take 1 tablet by mouth 2 (two) times daily. (Patient not taking: Reported on 03/30/2022)   No current facility-administered medications on file prior to visit.    Review of Systems  Constitutional:  Negative for chills, fever and weight loss.  HENT:  Positive for tinnitus. Negative for congestion and hearing loss.  Eyes:  Negative for blurred vision and double vision.  Respiratory:  Negative for cough and shortness of breath.   Cardiovascular:  Negative for chest pain, palpitations, orthopnea and leg swelling.  Gastrointestinal:  Positive for constipation and heartburn. Negative for abdominal pain, diarrhea, nausea and vomiting.  Genitourinary:  Negative for dysuria.  Musculoskeletal:  Positive for back pain, joint pain and myalgias. Negative for falls.  Skin:  Negative for rash.  Neurological:  Negative for dizziness, tingling, tremors, loss of consciousness and headaches.  Psychiatric/Behavioral:  Negative for depression, memory loss and suicidal ideas. The patient is nervous/anxious (worse when tried to wean off Cymbalta).    .   Physical Exam:  BP 128/80   Pulse 93   Temp 97.9 F (36.6 C)   Resp 16   Ht 5' 7.5" (1.715 m)   Wt 171 lb 12.8 oz (77.9 kg)   LMP  (LMP Unknown)   SpO2 96%   BMI 26.51 kg/m   General Appearance: Well nourished, in no apparent distress. Eyes: PERRLA, EOMs, conjunctiva no swelling or erythema Sinuses: No Frontal/maxillary tenderness ENT/Mouth: Ext aud canals clear, TMs without erythema, bulging. No erythema,  swelling, or exudate on post pharynx.  Tonsils not swollen or erythematous. Hearing normal.  Neck: Supple, thyroid normal.  Respiratory: Respiratory effort normal, BS equal bilaterally without rales, rhonchi, wheezing or stridor.  Cardio: RRR with no MRGs. Brisk peripheral pulses without edema.  Abdomen: Soft, + BS.  Non tender, no guarding, rebound, hernias, masses. Lymphatics: Non tender without lymphadenopathy.  Musculoskeletal: Full ROM, 5/5 strength, normal gait.  Skin: Warm, dry without rashes, lesions, ecchymosis.  Neuro: Cranial nerves intact. Normal muscle tone, no cerebellar symptoms. Sensation intact.  Psych: Awake and oriented X 3, normal affect, Insight and Judgment appropriate.     Alycia Rossetti, NP 9:50 AM [facility]

## 2022-03-30 ENCOUNTER — Ambulatory Visit (INDEPENDENT_AMBULATORY_CARE_PROVIDER_SITE_OTHER): Payer: BC Managed Care – PPO | Admitting: Nurse Practitioner

## 2022-03-30 ENCOUNTER — Encounter: Payer: Self-pay | Admitting: Nurse Practitioner

## 2022-03-30 VITALS — BP 128/80 | HR 93 | Temp 97.9°F | Resp 16 | Ht 67.5 in | Wt 171.8 lb

## 2022-03-30 DIAGNOSIS — I10 Essential (primary) hypertension: Secondary | ICD-10-CM

## 2022-03-30 DIAGNOSIS — Z79899 Other long term (current) drug therapy: Secondary | ICD-10-CM

## 2022-03-30 DIAGNOSIS — M797 Fibromyalgia: Secondary | ICD-10-CM | POA: Diagnosis not present

## 2022-03-30 DIAGNOSIS — E559 Vitamin D deficiency, unspecified: Secondary | ICD-10-CM

## 2022-03-30 DIAGNOSIS — Z9884 Bariatric surgery status: Secondary | ICD-10-CM

## 2022-03-30 DIAGNOSIS — E785 Hyperlipidemia, unspecified: Secondary | ICD-10-CM

## 2022-03-30 DIAGNOSIS — K5903 Drug induced constipation: Secondary | ICD-10-CM

## 2022-03-30 DIAGNOSIS — E663 Overweight: Secondary | ICD-10-CM

## 2022-03-30 DIAGNOSIS — G894 Chronic pain syndrome: Secondary | ICD-10-CM

## 2022-03-30 DIAGNOSIS — R7309 Other abnormal glucose: Secondary | ICD-10-CM

## 2022-03-30 DIAGNOSIS — L309 Dermatitis, unspecified: Secondary | ICD-10-CM

## 2022-03-30 DIAGNOSIS — B001 Herpesviral vesicular dermatitis: Secondary | ICD-10-CM

## 2022-03-30 MED ORDER — BETAMETHASONE DIPROPIONATE 0.05 % EX OINT: TOPICAL_OINTMENT | Freq: Two times a day (BID) | CUTANEOUS | 0 refills | Status: DC

## 2022-03-30 NOTE — Patient Instructions (Signed)

## 2022-03-31 LAB — LIPID PANEL
Cholesterol: 147 mg/dL (ref <200)
HDL: 70 mg/dL (ref >=50)
LDL Cholesterol (Calc): 62 mg/dL (calc)
Non-HDL Cholesterol (Calc): 77 mg/dL (calc) (ref <130)
Total CHOL/HDL Ratio: 2.1 (calc) (ref <5.0)
Triglycerides: 71 mg/dL (ref <150)

## 2022-03-31 LAB — COMPLETE METABOLIC PANEL WITH GFR
AG Ratio: 1.6 (calc) (ref 1.0–2.5)
ALT: 11 U/L (ref 6–29)
AST: 14 U/L (ref 10–35)
Albumin: 4.4 g/dL (ref 3.6–5.1)
Alkaline phosphatase (APISO): 172 U/L — ABNORMAL HIGH (ref 37–153)
BUN: 15 mg/dL (ref 7–25)
CO2: 29 mmol/L (ref 20–32)
Calcium: 9.5 mg/dL (ref 8.6–10.4)
Chloride: 104 mmol/L (ref 98–110)
Creat: 0.69 mg/dL (ref 0.50–1.03)
Globulin: 2.7 g/dL (calc) (ref 1.9–3.7)
Glucose, Bld: 76 mg/dL (ref 65–99)
Potassium: 3.8 mmol/L (ref 3.5–5.3)
Sodium: 142 mmol/L (ref 135–146)
Total Bilirubin: 0.4 mg/dL (ref 0.2–1.2)
Total Protein: 7.1 g/dL (ref 6.1–8.1)
eGFR: 102 mL/min/{1.73_m2} (ref >=60)

## 2022-03-31 LAB — CBC WITH DIFFERENTIAL/PLATELET
Absolute Monocytes: 441 cells/uL (ref 200–950)
Basophils Absolute: 21 cells/uL (ref 0–200)
Basophils Relative: 0.5 %
Eosinophils Absolute: 130 cells/uL (ref 15–500)
Eosinophils Relative: 3.1 %
HCT: 38.3 % (ref 35.0–45.0)
Hemoglobin: 12.7 g/dL (ref 11.7–15.5)
Lymphs Abs: 1483 cells/uL (ref 850–3900)
MCH: 29.1 pg (ref 27.0–33.0)
MCHC: 33.2 g/dL (ref 32.0–36.0)
MCV: 87.6 fL (ref 80.0–100.0)
MPV: 9.7 fL (ref 7.5–12.5)
Monocytes Relative: 10.5 %
Neutro Abs: 2125 cells/uL (ref 1500–7800)
Neutrophils Relative %: 50.6 %
Platelets: 351 10*3/uL (ref 140–400)
RBC: 4.37 10*6/uL (ref 3.80–5.10)
RDW: 13.9 % (ref 11.0–15.0)
Total Lymphocyte: 35.3 %
WBC: 4.2 10*3/uL (ref 3.8–10.8)

## 2022-03-31 LAB — HEMOGLOBIN A1C
Hgb A1c MFr Bld: 5.5 % of total Hgb (ref <5.7)
Mean Plasma Glucose: 111 mg/dL
eAG (mmol/L): 6.2 mmol/L

## 2022-04-15 ENCOUNTER — Encounter: Payer: Self-pay | Admitting: Nurse Practitioner

## 2022-04-17 ENCOUNTER — Other Ambulatory Visit: Payer: Self-pay

## 2022-04-17 DIAGNOSIS — M797 Fibromyalgia: Secondary | ICD-10-CM

## 2022-04-17 MED ORDER — QUETIAPINE FUMARATE 25 MG PO TABS: ORAL_TABLET | ORAL | 1 refills | Status: DC

## 2022-04-23 ENCOUNTER — Encounter: Payer: Self-pay | Admitting: Genetic Counselor

## 2022-04-23 ENCOUNTER — Other Ambulatory Visit: Payer: Self-pay | Admitting: Genetic Counselor

## 2022-04-23 ENCOUNTER — Inpatient Hospital Stay: Payer: BC Managed Care – PPO | Admitting: Genetic Counselor

## 2022-04-23 ENCOUNTER — Inpatient Hospital Stay: Payer: BC Managed Care – PPO

## 2022-04-23 DIAGNOSIS — Z803 Family history of malignant neoplasm of breast: Secondary | ICD-10-CM

## 2022-04-23 DIAGNOSIS — Z801 Family history of malignant neoplasm of trachea, bronchus and lung: Secondary | ICD-10-CM | POA: Diagnosis not present

## 2022-04-23 DIAGNOSIS — Z8042 Family history of malignant neoplasm of prostate: Secondary | ICD-10-CM | POA: Diagnosis not present

## 2022-04-23 DIAGNOSIS — Z8041 Family history of malignant neoplasm of ovary: Secondary | ICD-10-CM | POA: Diagnosis not present

## 2022-04-23 LAB — GENETIC SCREENING ORDER

## 2022-04-23 NOTE — Progress Notes (Signed)
REFERRING PROVIDER: Patton Salles, MD [address] [place],  [place] [address]  PRIMARY PROVIDER:  Lucky Cowboy, MD  PRIMARY REASON FOR VISIT:  Encounter Diagnoses  Name Primary?   Family history of breast cancer Yes   Family history of prostate cancer    Family history of ovarian cancer      HISTORY OF PRESENT ILLNESS:   Ms. Massaquoi, a 57 y.o. female, was seen for a [place] cancer genetics consultation at the request of Dr. Ardell Isaacs due to a family history of breast.  Ms. Fu presents to clinic today to discuss the possibility of a hereditary predisposition to cancer, to discuss genetic testing, and to further clarify her future cancer risks, as well as potential cancer risks for family members.   Ms. Westby is a 57 y.o. female with no personal history of cancer.    RISK FACTORS:  Mammogram within the last year: yes; category b density Number of breast biopsies: 0. Colonoscopy: yes;  most recent in 2019; 5 year f/u . Hysterectomy: no.  Ovaries intact: yes.  Menarche was at age 26.  First live birth at age 49.  Menopausal status: postmenopausal.  OCP use for approximately 10 years.  HRT use: 0 years. Dermatology screening: previously   Past Medical History:  Diagnosis Date   Allergy    Anemia    Anxiety    Arthritis    Fibromyalgia    GERD (gastroesophageal reflux disease)    Hypertension    IBS (irritable bowel syndrome)    Nephrolithiasis    Plantar fasciitis of right foot    wears boot     Past Surgical History:  Procedure Laterality Date   BREATH TEK H PYLORI  07/11/2011   Procedure: BREATH TEK H PYLORI;  Surgeon: Mariella Saa, MD;  Location: [facility];  Service: General;  Laterality: N/A;   CHOLECYSTECTOMY  10/01/2011   Procedure: LAPAROSCOPIC CHOLECYSTECTOMY WITH INTRAOPERATIVE CHOLANGIOGRAM;  Surgeon: Mariella Saa, MD;  Location: [facility];  Service: General;  Laterality: N/A;    COLONOSCOPY     GASTRIC ROUX-EN-Y  10/01/2011   Procedure: LAPAROSCOPIC ROUX-EN-Y GASTRIC;  Surgeon: Mariella Saa, MD;  Location: [facility];  Service: General;  Laterality: N/A;   Left shoulder arthroscopic surgery Left    bursectomy, in her early 47s   tummy tuck  2005     FAMILY HISTORY:  We obtained a detailed, 4-generation family history.  Significant diagnoses are listed below: Family History  Problem Relation Age of Onset   Breast cancer Mother 6       breast   Ovarian cancer Mother 56       primary or mets from breast?   Breast cancer Maternal Aunt 59       died at age 74   Prostate cancer Paternal Uncle        mets   Lung cancer Maternal Grandmother        dx >50   Breast cancer Maternal Grandmother        dx > 50   Breast cancer Cousin        dx early 102s      Ms. Grosso's maternal cousin, Lawanna Kobus, had negative panel genetic testing within the past year. Ms. Bowick is unaware of other previous family history of genetic testing for hereditary cancer risks.  Other relatives are unavailable for genetic testing at this time. There is no reported Ashkenazi Jewish ancestry. There is  no known consanguinity.   GENETIC COUNSELING ASSESSMENT: Ms. Arville Careorthington is a 57 y.o. female with a family history of breast cancer which is somewhat suggestive of a hereditary cancer syndrome given the ages of diagnosis. We, therefore, discussed and recommended the following at today's visit.   DISCUSSION: We discussed that 5 - 10% of cancer is hereditary, with most cases of hereditary breast cancer associated with mutations in BRCA1/2.  There are other genes that can be associated with hereditary breast, ovarian, or prostate cancer syndromes.  We discussed that testing is beneficial for several reasons, including knowing about other cancer risks, identifying potential screening and risk-reduction options that may be appropriate, and to understanding if other family members could be at  risk for cancer and allowing them to undergo genetic testing.  We reviewed the characteristics, features and inheritance patterns of hereditary cancer syndromes. We also discussed genetic testing, including the appropriate family members to test, the process of testing, insurance coverage and turn-around-time for results. We discussed the implications of a negative, positive, and variant of uncertain significant result. We discussed that negative results would be uninformative given that Ms. Mollett does not have a personal history of cancer. We recommended Ms. Wolfson pursue genetic testing for a panel that contains genes associated with breast, ovarian, prostate and other cancers.  Ms. Arville Careorthington was offered a common hereditary cancer panel (~45 genes) and an expanded pan-cancer panel (~70  genes). Ms. Arville Careorthington was informed of the benefits and limitations of each panel, including that expanded pan-cancer panels contain several genes that do not have clear management guidelines at this point in time.  We also discussed that as the number of genes included on a panel increases, the chances of variants of uncertain significance increases.  After considering the benefits and limitations of each gene panel, Ms. Kaluzny elected to have an expanded pan-cancer panel through [facility].   The CancerNext-Expanded gene panel offered by [facility] and includes sequencing, rearrangement, and RNA analysis for the following 71 genes:  AIP, ALK, APC, ATM, BAP1, BARD1, BMPR1A, BRCA1, BRCA2, BRIP1, CDC73, CDH1, CDK4, CDKN1B, CDKN2A, CHEK2, DICER1, FH, FLCN, KIF1B, LZTR1,MAX, MEN1, MET, MLH1, MSH2, MSH6, MUTYH, NF1, NF2, NTHL1, PALB2, PHOX2B, PMS2, POT1, PRKAR1A, PTCH1, PTEN, RAD51C,RAD51D, RB1, RET, SDHA, SDHAF2, SDHB, SDHC, SDHD, SMAD4, SMARCA4, SMARCB1, SMARCE1, STK11, SUFU, TMEM127, TP53,TSC1, TSC2 and VHL (sequencing and deletion/duplication); AXIN2, CTNNA1, EGFR, EGLN1, HOXB13, KIT, MITF, MSH3, PDGFRA,  POLD1 and POLE (sequencing only); EPCAM and GREM1 (deletion/duplication only).    Based on Ms. Liebert's family history of breast cancer, she meets medical criteria for genetic testing. Despite that she meets criteria, she may still have an out of pocket cost. We discussed that if her out of pocket cost for testing is over $100, the laboratory should contact them to discuss self-pay options and/or patient pay assistance programs.   We discussed the Genetic Information Non-Discrimination Act (GINA) of 2008, which helps protect individuals against genetic discrimination based on their genetic test results.  It impacts both health insurance and employment.  With health insurance, it protects against genetic test results being used for increased premiums or policy termination. For employment, it protects against hiring, firing and promoting decisions based on genetic test results.  GINA does not apply to those in the [employer] and [employer], those who work for companies with less than 15 employees, and new life insurance or long-term disability insurance policies.  Health status due to a cancer diagnosis is not protected under GINA.  PLAN: After considering the risks, benefits,  and limitations, Ms. Collazos provided informed consent to pursue genetic testing and the blood sample was sent to [facility] for analysis of the CancerNext-Expanded +RNAinsight Panel. Results should be available within approximately 3 weeks' time, at which point they will be disclosed by telephone to Ms. Strassner, as will any additional recommendations warranted by these results. Ms. Famous will receive a summary of her genetic counseling visit and a copy of her results once available. This information will also be available in Epic.   Ms. Sliker's questions were answered to her satisfaction today. Our contact information was provided should additional questions or concerns arise. Thank you for the referral and allowing [place]  to share in the care of your patient.   Donnabelle Blanchard M. Rennie Plowman, MS, [facility] Genetic Counselor [email or web address] (P) [phone]   The patient was seen for a total of 40 minutes in face-to-face genetic counseling.  The patient was seen alone.  Drs. Pamelia Hoit and/or Mosetta Putt were available to discuss this case as needed.  _______________________________________________________________________ For Office Staff:  Number of people involved in session: 1 Was an Intern/ student involved with case: no

## 2022-05-04 ENCOUNTER — Other Ambulatory Visit: Payer: Self-pay | Admitting: Nurse Practitioner

## 2022-05-04 DIAGNOSIS — M797 Fibromyalgia: Secondary | ICD-10-CM

## 2022-05-06 ENCOUNTER — Other Ambulatory Visit: Payer: Self-pay | Admitting: Nurse Practitioner

## 2022-05-06 DIAGNOSIS — B001 Herpesviral vesicular dermatitis: Secondary | ICD-10-CM

## 2022-05-08 ENCOUNTER — Encounter: Payer: Self-pay | Admitting: Genetic Counselor

## 2022-05-08 ENCOUNTER — Ambulatory Visit: Payer: Self-pay | Admitting: Genetic Counselor

## 2022-05-08 ENCOUNTER — Telehealth: Payer: Self-pay | Admitting: Genetic Counselor

## 2022-05-08 DIAGNOSIS — Z8042 Family history of malignant neoplasm of prostate: Secondary | ICD-10-CM

## 2022-05-08 DIAGNOSIS — Z1379 Encounter for other screening for genetic and chromosomal anomalies: Secondary | ICD-10-CM

## 2022-05-08 DIAGNOSIS — Z8041 Family history of malignant neoplasm of ovary: Secondary | ICD-10-CM

## 2022-05-08 DIAGNOSIS — Z803 Family history of malignant neoplasm of breast: Secondary | ICD-10-CM

## 2022-05-08 NOTE — Progress Notes (Signed)
HPI:   Wendy Valencia was previously seen in the [facility] clinic due to a family history of breast cancer and concerns regarding a hereditary predisposition to cancer. Please refer to our prior cancer genetics clinic note for more information regarding our discussion, assessment and recommendations, at the time. Wendy Valencia's recent genetic test results were disclosed to her, as were recommendations warranted by these results. These results and recommendations are discussed in more detail below.  CANCER HISTORY:  Wendy Valencia is a 57 y.o. female with no personal history of cancer.       FAMILY HISTORY:  We obtained a detailed, 4-generation family history.  Significant diagnoses are listed below:      Family History  Problem Relation Age of Onset   Breast cancer Mother 38        breast   Ovarian cancer Mother 47        primary or mets from breast?   Breast cancer Maternal Aunt 38        died at age 53   Prostate cancer Paternal Uncle          mets   Lung cancer Maternal Grandmother          dx >50   Breast cancer Maternal Grandmother          dx > 50   Breast cancer Cousin          dx early 60s         Wendy Valencia's maternal cousin, Lawanna Kobus, had negative panel genetic testing within the past year. Wendy Valencia is unaware of other previous family history of genetic testing for hereditary cancer risks.  Other relatives are unavailable for genetic testing at this time. There is no reported Ashkenazi Jewish ancestry. There is no known consanguinity.     GENETIC TEST RESULTS:  The [facility] CancerNext-Expanded +RNAinsight Panel found no pathogenic mutations.   The CancerNext-Expanded gene panel offered by [facility] and includes sequencing, rearrangement, and RNA analysis for the following 71 genes:  AIP, ALK, APC, ATM, BAP1, BARD1, BMPR1A, BRCA1, BRCA2, BRIP1, CDC73, CDH1, CDK4, CDKN1B, CDKN2A, CHEK2, DICER1, FH, FLCN, KIF1B, LZTR1,MAX, MEN1, MET, MLH1,  MSH2, MSH6, MUTYH, NF1, NF2, NTHL1, PALB2, PHOX2B, PMS2, POT1, PRKAR1A, PTCH1, PTEN, RAD51C,RAD51D, RB1, RET, SDHA, SDHAF2, SDHB, SDHC, SDHD, SMAD4, SMARCA4, SMARCB1, SMARCE1, STK11, SUFU, TMEM127, TP53,TSC1, TSC2 and VHL (sequencing and deletion/duplication); AXIN2, CTNNA1, EGFR, EGLN1, HOXB13, KIT, MITF, MSH3, PDGFRA, POLD1 and POLE (sequencing only); EPCAM and GREM1 (deletion/duplication only).   The test report has been scanned into EPIC and is located under the Molecular Pathology section of the Results Review tab.  A portion of the result report is included below for reference. Genetic testing reported out on May 01, 2022.      Even though a pathogenic variant was not identified, possible explanations for the cancer in the family may include: There may be no hereditary risk for cancer in the family. The cancers in Wendy Valencia's family may be sporadic/familial or due to other genetic and environmental factors. There may be a gene mutation in one of these genes that current testing methods cannot detect but that chance is small. There could be another gene that has not yet been discovered, or that we have not yet tested, that is responsible for the cancer diagnoses in the family.  It is also possible there is a hereditary cause for the cancer in the family that Wendy Valencia did not inherit.  Therefore, it is important  to remain in touch with cancer genetics in the future so that we can continue to offer Wendy Valencia the most up to date genetic testing.     ADDITIONAL GENETIC TESTING:  We discussed with Wendy Valencia that her genetic testing was fairly extensive.  If there are additional relevant genes identified to increase cancer risk that can be analyzed in the future, we would be happy to discuss and coordinate this testing at that time.      CANCER SCREENING RECOMMENDATIONS:  Wendy Valencia's test result is considered negative (normal).  This means that we have not  identified a hereditary cause for her family history of breast cancer at this time.   An individual's cancer risk and medical management are not determined by genetic test results alone. Overall cancer risk assessment incorporates additional factors, including personal medical history, family history, and any available genetic information that may result in a personalized plan for cancer prevention and surveillance. Therefore, it is recommended she continue to follow the cancer management and screening guidelines provided by her primary healthcare provider.  Her lifetime risk for breast cancer based on Tyrer-Cuzick risk model and reported personal/family history is 11.7%.  This is elevated compared to females of the same age in the general population (8.2%) but not in the 'high risk for breast cancer' category per NCCN guidelines (>20%).  This risk estimate can change over time and may be repeated to reflect new information in her personal or family history in the future.           RECOMMENDATIONS FOR FAMILY MEMBERS:   Since she did not inherit a identifiable mutation in a cancer predisposition gene included on this panel, her children could not have inherited a known mutation from her in one of these genes. Individuals in this family might be at some increased risk of developing cancer, over the general population risk, due to the family history of cancer.  Individuals in the family should notify their providers of the family history of cancer. We recommend women in this family have a yearly mammogram beginning at age 70, or 71 years younger than the earliest onset of cancer, an annual clinical breast exam, and perform monthly breast self-exams.  Other members of the family may still carry a pathogenic variant in one of these genes that Wendy Valencia did not inherit. Based on the family history, we recommend other maternal relatives have genetic counseling and testing. Wendy Valencia can let us  know if we can be of any assistance in coordinating genetic counseling and/or testing for these family members.     FOLLOW-UP:  Lastly, we discussed with Wendy Valencia that cancer genetics is a rapidly advancing field and it is possible that new genetic tests will be appropriate for her and/or her family members in the future. We encouraged her to remain in contact with cancer genetics on an annual basis so we can update her personal and family histories and let her know of advances in cancer genetics that may benefit this family.   Our contact number was provided. Wendy Valencia's questions were answered to her satisfaction, and she knows she is welcome to call us at anytime with additional questions or concerns.   Laverne Hursey M. Rennie Plowman, MS, [facility] Genetic Counselor [email or web address] (P) [phone]

## 2022-05-08 NOTE — Telephone Encounter (Signed)
Disclosed negative genetics.    

## 2022-05-13 ENCOUNTER — Other Ambulatory Visit: Payer: Self-pay | Admitting: Nurse Practitioner

## 2022-05-13 DIAGNOSIS — M797 Fibromyalgia: Secondary | ICD-10-CM

## 2022-06-09 ENCOUNTER — Other Ambulatory Visit: Payer: Self-pay | Admitting: Nurse Practitioner

## 2022-06-09 DIAGNOSIS — M797 Fibromyalgia: Secondary | ICD-10-CM

## 2022-06-25 ENCOUNTER — Other Ambulatory Visit: Payer: Self-pay | Admitting: Internal Medicine

## 2022-06-25 DIAGNOSIS — Z1231 Encounter for screening mammogram for malignant neoplasm of breast: Secondary | ICD-10-CM

## 2022-06-26 ENCOUNTER — Ambulatory Visit
Admission: RE | Admit: 2022-06-26 | Discharge: 2022-06-26 | Disposition: A | Discharge status: Home / Self Care | Payer: BC Managed Care – PPO | Source: Ambulatory Visit | Attending: Internal Medicine | Admitting: Internal Medicine

## 2022-06-26 DIAGNOSIS — Z1231 Encounter for screening mammogram for malignant neoplasm of breast: Secondary | ICD-10-CM

## 2022-07-01 ENCOUNTER — Other Ambulatory Visit: Payer: Self-pay | Admitting: Nurse Practitioner

## 2022-07-01 DIAGNOSIS — M797 Fibromyalgia: Secondary | ICD-10-CM

## 2022-07-02 ENCOUNTER — Encounter: Payer: Self-pay | Admitting: Gastroenterology

## 2022-07-09 ENCOUNTER — Other Ambulatory Visit: Payer: Self-pay | Admitting: Nurse Practitioner

## 2022-07-09 DIAGNOSIS — L309 Dermatitis, unspecified: Secondary | ICD-10-CM

## 2022-07-09 DIAGNOSIS — M797 Fibromyalgia: Secondary | ICD-10-CM

## 2022-08-06 ENCOUNTER — Other Ambulatory Visit: Payer: Self-pay | Admitting: Nurse Practitioner

## 2022-08-06 DIAGNOSIS — B001 Herpesviral vesicular dermatitis: Secondary | ICD-10-CM

## 2022-08-08 ENCOUNTER — Encounter: Payer: Self-pay | Admitting: Nurse Practitioner

## 2022-08-08 ENCOUNTER — Other Ambulatory Visit: Payer: Self-pay | Admitting: Internal Medicine

## 2022-08-08 ENCOUNTER — Ambulatory Visit (AMBULATORY_SURGERY_CENTER): Payer: BC Managed Care – PPO

## 2022-08-08 VITALS — Ht 67.5 in | Wt 170.0 lb

## 2022-08-08 DIAGNOSIS — Z1211 Encounter for screening for malignant neoplasm of colon: Secondary | ICD-10-CM

## 2022-08-08 DIAGNOSIS — B001 Herpesviral vesicular dermatitis: Secondary | ICD-10-CM

## 2022-08-08 MED ORDER — VALACYCLOVIR HCL 1 G PO TABS
ORAL_TABLET | ORAL | 1 refills | Status: AC
Start: 2022-08-08 — End: ?

## 2022-08-08 NOTE — Progress Notes (Signed)
Pre visit completed via phone call; Patient verified name, DOB, and address;  Patient reported she was wanting to have an EGD on the same day as her colon because of the constipation, bloating, and abdominal pain after eating;  PV halted and patient was scheduled for an OV with APP and colonoscopy appt was NOT cancelled at this time (in case the EGD could be added to that day);  patient WAS made aware that having an EGD along with her colon on the same day may cause the original appt to be rescheduled for a date further out - Patient verbalized understanding of information/instructions;  Patient advised to call back to the office at [phone] should questions/concerns arise;

## 2022-08-15 ENCOUNTER — Ambulatory Visit (INDEPENDENT_AMBULATORY_CARE_PROVIDER_SITE_OTHER): Payer: BC Managed Care – PPO | Admitting: Gastroenterology

## 2022-08-15 ENCOUNTER — Other Ambulatory Visit: Payer: Self-pay

## 2022-08-15 ENCOUNTER — Encounter: Payer: Self-pay | Admitting: Gastroenterology

## 2022-08-15 VITALS — BP 114/66 | HR 83 | Ht 67.5 in | Wt 171.2 lb

## 2022-08-15 DIAGNOSIS — K5900 Constipation, unspecified: Secondary | ICD-10-CM | POA: Diagnosis not present

## 2022-08-15 DIAGNOSIS — R6881 Early satiety: Secondary | ICD-10-CM

## 2022-08-15 DIAGNOSIS — K219 Gastro-esophageal reflux disease without esophagitis: Secondary | ICD-10-CM | POA: Diagnosis not present

## 2022-08-15 MED ORDER — NA SULFATE-K SULFATE-MG SULF 17.5-3.13-1.6 GM/177ML PO SOLN: 1.0000 | Freq: Once | ORAL | 0 refills | Status: AC

## 2022-08-15 MED ORDER — WEGOVY 2.4 MG/0.75ML ~~LOC~~ SOAJ: SUBCUTANEOUS | 5 refills | Status: DC

## 2022-08-15 NOTE — Patient Instructions (Addendum)
  You have been scheduled for a colonoscopy/EGD. Please follow written instructions given to you at your visit today.   Please pick up your prep supplies at the pharmacy within the next 1-3 days.  If you use inhalers (even only as needed), please bring them with you on the day of your procedure.  DO NOT TAKE 7 DAYS PRIOR TO TEST- Trulicity (dulaglutide) Ozempic, Wegovy (semaglutide) Mounjaro (tirzepatide) Bydureon Bcise (exanatide extended release)  DO NOT TAKE 1 DAY PRIOR TO YOUR TEST Rybelsus (semaglutide) Adlyxin (lixisenatide) Victoza (liraglutide) Byetta (exanatide) ___________________________________________________________________________  _______________________________________________________  If your blood pressure at your visit was 140/90 or greater, please contact your primary care physician to follow up on this.  _______________________________________________________  If you are age 19 or older, your body mass index should be between 23-30. Your Body mass index is 26.43 kg/m. If this is out of the aforementioned range listed, please consider follow up with your Primary Care Provider.  If you are age 29 or younger, your body mass index should be between 19-25. Your Body mass index is 26.43 kg/m. If this is out of the aformentioned range listed, please consider follow up with your Primary Care Provider.   ________________________________________________________  The [facility] providers would like to encourage you to use [facility] to communicate with providers for non-urgent requests or questions.  Due to long hold times on the telephone, sending your provider a message by [facility] may be a faster and more efficient way to get a response.  Please allow 48 business hours for a response.  Please remember that this is for non-urgent requests.  _______________________________________________________ It was a pleasure to see you today!  Thank you for trusting me with your  gastrointestinal care!

## 2022-08-15 NOTE — Progress Notes (Signed)
Chief Complaint: constipation and upper GI symptoms Primary GI MD: Dr. Russella Dar  HPI: 57 year old female history of anxiety, fibromyalgia, chronic iron deficiency anemia, GERD, constipation, s/p cholecystectomy and Roux-en-Y surgery 2013, presents for evaluation of constipation and upper GI symptoms.  Last seen 09/2021 by Alcide Evener, NP.  At that time she reported constipation.  She was put on Amitiza 24 mcg p.o. twice daily and set up with GYN consultation to assess for rectocele and cystocele.  Patient reports she is on Amitiza with no relief.  She has also tried fiber, MiraLAX, and Linzess in the past with no relief.  She saw GYN with negative workup.  States sometimes her stools are small and skinny and sometimes they are "logs".  But often times when she goes to the bathroom she has to "push on her vagina to get stool out".  Most recently she has started to use seamoss as a natural remedy and she has had success with this over the last week.  Denies weight loss.  Chronic iron deficiency anemia on oral iron. Lab work 03/30/2022 shows Hgb 12.7, MCV 87.6 CMP significant for elevation of alk phos of 172  History of gastric bypass 2013 and tummy tuck in 2007.  She is also on Wegovy.  She states often times when she eats food she feels like it "sits in her stomach."  Denies dysphagia.  Has occasional GERD.  States that she eats small frequent meals throughout the day and that seems to help but she feels like "her food gets in her stomach like a washing machine full of clothes without water and just rubs on each other and sits."  Patient has difficulty drinking water so she often drinks pineapple water which makes her GERD worse.  Not interested in taking medication at this time   PREVIOUS GI WORKUP   Colonoscopy 04/2017: - The entire examined colon is normal on direct and retroflexion views.  - No specimens collected. Repeat 5 years  EGD 06/27/2010 2002 which was normal.   Past  Medical History:  Diagnosis Date   Allergy    Anemia    Anxiety    Arthritis    Fibromyalgia    GERD (gastroesophageal reflux disease)    Hypertension    IBS (irritable bowel syndrome)    Nephrolithiasis    Plantar fasciitis of right foot    wears boot     Past Surgical History:  Procedure Laterality Date   BREATH TEK H PYLORI  07/11/2011   Procedure: BREATH TEK H PYLORI;  Surgeon: Mariella Saa, MD;  Location: [facility];  Service: General;  Laterality: N/A;   CHOLECYSTECTOMY  10/01/2011   Procedure: LAPAROSCOPIC CHOLECYSTECTOMY WITH INTRAOPERATIVE CHOLANGIOGRAM;  Surgeon: Mariella Saa, MD;  Location: [facility];  Service: General;  Laterality: N/A;   COLONOSCOPY     GASTRIC ROUX-EN-Y  10/01/2011   Procedure: LAPAROSCOPIC ROUX-EN-Y GASTRIC;  Surgeon: Mariella Saa, MD;  Location: [facility];  Service: General;  Laterality: N/A;   Left shoulder arthroscopic surgery Left    bursectomy, in her early 45s   tummy tuck  2005    Current Outpatient Medications  Medication Sig Dispense Refill   Aspirin-Acetaminophen-Caffeine (EXCEDRIN EXTRA STRENGTH PO) Take 1 tablet by mouth 3 (three) times daily as needed (headache).      betamethasone dipropionate (DIPROLENE) 0.05 % ointment APPLY TO THE AFFECTED AREA(S) 2 TIMES A DAY 30 g 0   cetirizine-pseudoephedrine (ZYRTEC-D) 5-120 MG tablet Take 1 tablet by mouth  every 12 (twelve) hours as needed for allergies.      diphenhydrAMINE (BENADRYL) 25 MG tablet Take 25-50 mg by mouth 3 (three) times daily as needed for allergies.     DULoxetine (CYMBALTA) 30 MG capsule TAKE 1 CAPSULE BY MOUTH DAILY WITH 60 MG CAPSULE 30 capsule 1   DULoxetine (CYMBALTA) 60 MG capsule TAKE 1 CAPSULE BY MOUTH DAILY WITH 30 MG 30 capsule 1   gabapentin (NEURONTIN) 600 MG tablet TAKE 1 TABLET BY MOUTH 3 TIMES A DAY THEN TAKE 1 AND 1/2 TABLETS BY MOUTH EVERY NIGHT AT BEDTIME AS DIRECTED 135 tablet 1   Glucosamine-Chondroitin (COSAMIN DS PO) Take 1,500 mg by  mouth daily.     IBU 600 MG tablet Take 600 mg by mouth every 6 (six) hours as needed.     Iron-FA-B Cmp-C-Biot-Probiotic (FUSION PLUS) CAPS Take 1 cap every 2-3 days as tolerated for chronic iron deficiency. 30 capsule 0   losartan-hydrochlorothiazide (HYZAAR) 100-25 MG tablet TAKE ONE TABLET BY MOUTH DAILY 30 tablet 11   MAGNESIUM PO Take 1,000 mg by mouth daily at 6 (six) AM.     Multiple Vitamins-Minerals (MULTIVITAMIN WITH MINERALS) tablet Take 1 tablet by mouth daily. Marland Kitchen     NOVOFINE 32G X 6 MM MISC Needs daily for victoza 100 each 5   QUEtiapine (SEROQUEL) 25 MG tablet TAKE THREE TABLETS  EVERY NIGHT AT BEDTIME                                                                 /                      TAKE                                          BY                                        MOUTH 270 tablet 1   Semaglutide-Weight Management (WEGOVY) 2.4 MG/0.75ML SOAJ INJECT 2.4 MG UNDER THE SKIN ONCE WEEKLY 3 mL 5   traMADol (ULTRAM) 50 MG tablet Take 1 tablet (50 mg total) by mouth every 6 (six) hours as needed for severe pain. Each refill must last 30 days. 120 tablet 5   valACYclovir (VALTREX) 1000 MG tablet Take 1 tablet Daily for "cold sores                                                                    /  TAKE                                         BY                                                 MOUTH 90 tablet 1   Vitamin D, Ergocalciferol, (DRISDOL) 1.25 MG (50000 UNIT) CAPS capsule TAKE 1 CAPSULE TWO TIMES A WEEK 26 capsule 3   No current facility-administered medications for this visit.    Allergies as of 08/15/2022   (No Known Allergies)    Family History  Problem Relation Age of Onset   Breast cancer Mother 1       breast   Ovarian cancer Mother 44       primary or mets from breast?   Heart disease Father    Hypertension Father    Stroke Father    Obesity Brother    Breast cancer Maternal Aunt 30        died at age 72   Multiple sclerosis Maternal Uncle    Prostate cancer Paternal Uncle        mets   Lung cancer Maternal Grandmother        dx >50   Breast cancer Maternal Grandmother        dx > 50   Heart attack Maternal Grandfather    Heart disease Maternal Grandfather    Heart attack Paternal Grandfather    Breast cancer Cousin        dx early 63s   Colon cancer Neg Hx    Colon polyps Neg Hx    Esophageal cancer Neg Hx    Rectal cancer Neg Hx     Social History   Socioeconomic History   Marital status: Married    Spouse name: Not on file   Number of children: 1   Years of education: Not on file   Highest education level: Not on file  Occupational History    Comment: Scientist, water quality for [employer]  Tobacco Use   Smoking status: Every Day    Current packs/day: 0.15    Average packs/day: 0.2 packs/day for 33.6 years (5.0 ttl pk-yrs)    Types: Cigarettes    Start date: 32   Smokeless tobacco: Never  Vaping Use   Vaping status: Never Used  Substance and Sexual Activity   Alcohol use: Yes    Alcohol/week: 1.0 standard drink of alcohol    Types: 1 Glasses of wine per week    Comment: 1x week   Drug use: No   Sexual activity: Yes    Partners: Male    Birth control/protection: Post-menopausal    Comment: 1st intercourse- 18, partners-?, married-  Other Topics Concern   Not on file  Social History Narrative   Not on file   Social Determinants of Health   Financial Resource Strain: Not on file  Food Insecurity: Not on file  Transportation Needs: Not on file  Physical Activity: Not on file  Stress: Not on file  Social Connections: Not on file  Intimate Partner Violence: Not on file    Review of Systems:    Constitutional: No weight loss, fever, chills, weakness or fatigue HEENT: Eyes: No change in vision  Ears, Nose, Throat:  No change in hearing or congestion Skin: No rash or itching Cardiovascular: No chest pain, chest pressure or  palpitations   Respiratory: No SOB or cough Gastrointestinal: See HPI and otherwise negative Genitourinary: No dysuria or change in urinary frequency Neurological: No headache, dizziness or syncope Musculoskeletal: No new muscle or joint pain Hematologic: No bleeding or bruising Psychiatric: No history of depression or anxiety    Physical Exam:  Vital signs: LMP  (LMP Unknown)   Constitutional: NAD, Well developed, Well nourished, alert and cooperative Head:  Normocephalic and atraumatic. Eyes:   PEERL, EOMI. No icterus. Conjunctiva pink. Respiratory: Respirations even and unlabored. Lungs clear to auscultation bilaterally.   No wheezes, crackles, or rhonchi.  Cardiovascular:  Regular rate and rhythm. No peripheral edema, cyanosis or pallor.  Gastrointestinal:  Soft, nondistended, nontender. No rebound or guarding. Normal bowel sounds. No appreciable masses or hepatomegaly. Rectal:  Not performed.  Msk:  Symmetrical without gross deformities. Without edema, no deformity or joint abnormality.  Neurologic:  Alert and  oriented x4;  grossly normal neurologically.  Skin:   Dry and intact without significant lesions or rashes. Psychiatric: Oriented to person, place and time. Demonstrates good judgement and reason without abnormal affect or behaviors.   RELEVANT LABS AND IMAGING: CBC    Component Value Date/Time   WBC 4.2 03/30/2022 0956   RBC 4.37 03/30/2022 0956   HGB 12.7 03/30/2022 0956   HGB 14.8 02/16/2013 1348   HCT 38.3 03/30/2022 0956   PLT 351 03/30/2022 0956   MCV 87.6 03/30/2022 0956   MCH 29.1 03/30/2022 0956   MCHC 33.2 03/30/2022 0956   RDW 13.9 03/30/2022 0956   LYMPHSABS 1,483 03/30/2022 0956   MONOABS 630 03/05/2016 1720   EOSABS 130 03/30/2022 0956   BASOSABS 21 03/30/2022 0956    CMP     Component Value Date/Time   NA 142 03/30/2022 0956   K 3.8 03/30/2022 0956   CL 104 03/30/2022 0956   CO2 29 03/30/2022 0956   GLUCOSE 76 03/30/2022 0956   BUN 15  03/30/2022 0956   CREATININE 0.69 03/30/2022 0956   CALCIUM 9.5 03/30/2022 0956   PROT 7.1 03/30/2022 0956   ALBUMIN 4.5 03/05/2016 1720   AST 14 03/30/2022 0956   ALT 11 03/30/2022 0956   ALKPHOS 123 (H) 03/05/2016 1720   BILITOT 0.4 03/30/2022 0956   GFRNONAA 97 12/14/2019 1700   GFRAA 113 12/14/2019 1700     Assessment/Plan:   Early satiety Gastroesophageal reflux disease, unspecified whether esophagitis present Occasional GERD with feeling as though food sits in her stomach.  She has a history of tummy tuck 2007, gastric bypass 2013, she is also on Wegovy.  Suspect patient probably has delayed gastric emptying as result of her gastric bypass and use of Wegovy.  However, patient does want an EGD to look at her previous gastric bypass and make sure everything is okay. --- EGD for further evaluation --- I thoroughly discussed the procedure with the patient (at bedside) to include nature of the procedure, alternatives, benefits, and risks (including but not limited to bleeding, infection, perforation, anesthesia/cardiac pulmonary complications).  Patient verbalized understanding and gave verbal consent to proceed with procedure.  --- Continue to eat small frequent meals throughout the day and to adequately --- If EGD is negative, will get gastric emptying study to evaluate for gastroparesis --- Educated patient on GERD lifestyle modifications  Constipation, unspecified constipation type Currently well-controlled with sea moss.  Has tried MiraLAX,  fiber, Linzess, and Amitiza in the past without results.  Having small caliber stools more recently.  Suspect constipation is worsened by her use of Wegovy, history of gastric bypass, and oral iron supplementation.  As well as her decreased water intake. --- Colonoscopy for further evaluation and for screening --- I thoroughly discussed the procedure with the patient (at bedside) to include nature of the procedure, alternatives, benefits, and  risks (including but not limited to bleeding, infection, perforation, anesthesia/cardiac pulmonary complications).  Patient verbalized understanding and gave verbal consent to proceed with procedure.  --- Continue sea moss since that is working well..  Continue to increase water, increase fiber, increase exercise  Boone Master, PA-C [facility] 08/15/2022, 8:25 AM  Cc: Lucky Cowboy, MD

## 2022-08-21 NOTE — Progress Notes (Unsigned)
PROVIDER NOTE: Information contained herein reflects review and annotations entered in association with encounter. Interpretation of such information and data should be left to medically-trained personnel. Information provided to patient can be located elsewhere in the medical record under "Patient Instructions". Document created using STT-dictation technology, any transcriptional errors that may result from process are unintentional.    Patient: Wendy Valencia  Service Category: E/M  Provider: Oswaldo Done, MD  DOB: [birth year]  DOS: 08/22/2022  Referring Provider: Lucky Cowboy, MD  MRN: [id number]  Specialty: Interventional Pain Management  PCP: Lucky Cowboy, MD  Type: Established Patient  Setting: Ambulatory outpatient    Location: Office  Delivery: Face-to-face     HPI  Wendy Valencia, a 57 y.o. year old female, is here today because of her Chronic pain syndrome [G89.4]. Wendy Valencia primary complain today is No chief complaint on file.  Pertinent problems: Wendy Valencia has Cervical spondylosis without myelopathy; Fibromyalgia affecting multiple sites (2ry area of Pain); Spondylosis of lumbar spine; Chronic generalized pain (1ry area of Pain); Chronic pain syndrome; Chronic shoulder pain (3ry area of Pain) (Bilateral) (R>L); Chronic hip pain (4th area of Pain) (Bilateral) (R>L); Greater trochanteric bursitis of hips (Bilateral); Osteoarthritis of hips (Bilateral); Lumbar facet syndrome (Right); Spondylosis without myelopathy or radiculopathy, lumbosacral region; Chronic hip pain (Left); and Chronic low back pain (Right) w/o sciatica on their pertinent problem list. Pain Assessment: Severity of   is reported as a  /10. Location:    / . Onset:  . Quality:  . Timing:  . Modifying factor(s):  Marland Kitchen Vitals:  vitals were not taken for this visit.  BMI: Estimated body mass index is 26.43 kg/m as calculated from the following:   Height as of 08/15/22: 5' 7.5" (1.715 m).   Weight  as of 08/15/22: 171 lb 4 oz (77.7 kg). Last encounter: 02/19/2022. Last procedure: Visit date not found.  Reason for encounter: medication management. ***  RTCB: 02/26/2023   Pharmacotherapy Assessment  Analgesic: Tramadol 50 mg 2 tablets twice daily (200 mg/day of tramadol) MME/day: 20 mg/day.   Monitoring: [place] PMP: PDMP reviewed during this encounter.       Pharmacotherapy: No side-effects or adverse reactions reported. Compliance: No problems identified. Effectiveness: Clinically acceptable.  No notes on file  CBD:THC Ratio  Date Value Ref Range Status  02/27/2021 Negative RATIO Final    Comment:    INTERPRETATION: Neither CBD nor marijuana or marijuana/THC products detected in this sample.  This test measures Cannabidiol (CBD) and Tetrahydrocannabinol (THC) and metabolites in urine. The CBD:THC ratio is calculated using the sums of the respective metabolites and is intended to assist in differentiating the presence of Tetrahydrocannabinol [facility]) metabolites due to the use of marijuana or medicinal THC from the presence of THC metabolites due to use of Cannabidiol (CBD) or hemp products that purportedly contain trace amounts of THC.  CBD:THC Ratio          Interpretation ---------      --------------------------------------------- >=10.0         Consistent with the use of CBD products only 1.0 - 9.9      Indeterminate <1.0           Consistent with marijuana, medicinal THC or                mixed use  Interpretive ranges are provided as guidance and should not be considered definitive. Interpretation of results should include consideration of all relevant clinical and diagnostic information.  Analysis  performed by chromatography with mass spectrometry. This test was developed and its performance characteristics determined by [facility].  It has not been cleared or approved by the Food and Drug Administration.    Carboxy-Delta-8-THC  Date Value Ref Range Status   02/27/2021 Not Detected ng/mL Final   Carboxy-Delta-9-THC  Date Value Ref Range Status  02/27/2021 Not Detected ng/mL Final    Comment:    Carboxy-Delta-9-THC is the primary metabolite of Delta-9- Tetrahydrocannabinol. Sources include the prescription medication Dronabinol as well as illicit, recreational, and medical marijuana and marijuana derived products of the same categories.  Carboxy-Delta-8-THC is the primary metabolite of Delta-8- Tetrahydrocannabinol. Sources are products containing Delta- 8-THC, which is primarily chemically manufactured from cannabidiol (CBD).  Testing Threshold = 2.0 ng/mL  Analysis performed by Liquid Chromatography with Tandem Mass Spectrometry (LC/MS/MS).  This test was developed and its performance characteristics determined by [facility].  It has not been cleared or approved by the Food and Drug Administration.     UDS:  Summary  Date Value Ref Range Status  02/20/2022 Note  Final    Comment:    ==================================================================== [employer]ox[employer] Select 13 (MW) ==================================================================== Test                             Result       Flag       Units  Drug Present   Tramadol                       >4310                   ng/mg creat   O-Desmethyltramadol            >4310                   ng/mg creat   N-Desmethyltramadol            >4310                   ng/mg creat    Source of tramadol is a prescription medication. O-desmethyltramadol    and N-desmethyltramadol are expected metabolites of tramadol.  ==================================================================== Test                      Result    Flag   Units      Ref Range   Creatinine              116              mg/dL      >=16 ==================================================================== Declared Medications:  Medication list was not  provided. ==================================================================== For clinical consultation, please call [phone]. ====================================================================       ROS  Constitutional: Denies any fever or chills Gastrointestinal: No reported hemesis, hematochezia, vomiting, or acute GI distress Musculoskeletal: Denies any acute onset joint swelling, redness, loss of ROM, or weakness Neurological: No reported episodes of acute onset apraxia, aphasia, dysarthria, agnosia, amnesia, paralysis, loss of coordination, or loss of consciousness  Medication Review  Aspirin-Acetaminophen-Caffeine, DULoxetine, Fusion Plus, Glucosamine-Chondroitin, Insulin Pen Needle, Magnesium, QUEtiapine, Semaglutide-Weight Management, Vitamin D (Ergocalciferol), betamethasone dipropionate, cetirizine-pseudoephedrine, diphenhydrAMINE, gabapentin, ibuprofen, losartan-hydrochlorothiazide, multivitamin with minerals, traMADol, and valACYclovir  History Review  Allergy: Wendy Valencia has No Known Allergies. Drug: Wendy Valencia  reports no history of drug use. Alcohol:  reports current alcohol use of about 1.0 standard drink of alcohol per  week. Tobacco:  reports that she has been smoking cigarettes. She started smoking about 33 years ago. She has a 5 pack-year smoking history. She has never used smokeless tobacco. Social: Wendy Valencia  reports that she has been smoking cigarettes. She started smoking about 33 years ago. She has a 5 pack-year smoking history. She has never used smokeless tobacco. She reports current alcohol use of about 1.0 standard drink of alcohol per week. She reports that she does not use drugs. Medical:  has a past medical history of Allergy, Anemia, Anxiety, Arthritis, Fibromyalgia, GERD (gastroesophageal reflux disease), Hypertension, IBS (irritable bowel syndrome), Nephrolithiasis, and Plantar fasciitis of right foot. Surgical: Wendy Valencia  has  a past surgical history that includes Left shoulder arthroscopic surgery (Left); tummy tuck (2005); Breath tek h pylori (07/11/2011); Gastric Roux-En-Y (10/01/2011); Cholecystectomy (10/01/2011); and Colonoscopy. Family: family history includes Breast cancer in her cousin and maternal grandmother; Breast cancer (age of onset: 67) in her mother; Breast cancer (age of onset: 29) in her maternal aunt; Heart attack in her maternal grandfather and paternal grandfather; Heart disease in her father and maternal grandfather; Hypertension in her father; Lung cancer in her maternal grandmother; Multiple sclerosis in her maternal uncle; Obesity in her brother; Ovarian cancer (age of onset: 8) in her mother; Prostate cancer in her paternal uncle; Stroke in her father.  Laboratory Chemistry Profile   Renal Lab Results  Component Value Date   BUN 15 03/30/2022   CREATININE 0.69 03/30/2022   LABCREA 120 09/20/2021   BCR SEE NOTE: 03/30/2022   GFRAA 113 12/14/2019   GFRNONAA 97 12/14/2019    Hepatic Lab Results  Component Value Date   AST 14 03/30/2022   ALT 11 03/30/2022   ALBUMIN 4.5 03/05/2016   ALKPHOS 123 (H) 03/05/2016    Electrolytes Lab Results  Component Value Date   NA 142 03/30/2022   K 3.8 03/30/2022   CL 104 03/30/2022   CALCIUM 9.5 03/30/2022   MG 2.4 09/20/2021    Bone Lab Results  Component Value Date   VD25OH 44 09/20/2021    Inflammation (CRP: Acute Phase) (ESR: Chronic Phase) Lab Results  Component Value Date   CRP 0.9 03/04/2017   ESRSEDRATE 22 (H) 03/04/2017         Note: Above Lab results reviewed.  Recent Imaging Review  MM 3D SCREENING MAMMOGRAM BILATERAL BREAST CLINICAL DATA:  Screening.  EXAM: DIGITAL SCREENING BILATERAL MAMMOGRAM WITH TOMOSYNTHESIS AND CAD  TECHNIQUE: Bilateral screening digital craniocaudal and mediolateral oblique mammograms were obtained. Bilateral screening digital breast tomosynthesis was performed. The images were evaluated  with computer-aided detection.  COMPARISON:  Previous exam(s).  ACR Breast Density Category b: There are scattered areas of fibroglandular density.  FINDINGS: There are no findings suspicious for malignancy.  IMPRESSION: No mammographic evidence of malignancy. A result letter of this screening mammogram will be mailed directly to the patient.  RECOMMENDATION: Screening mammogram in one year. (Code:SM-B-01Y)  BI-RADS CATEGORY  1: Negative.  Electronically Signed   By: Bary Richard M.D.   On: 06/28/2022 11:57 Note: Reviewed        Physical Exam  General appearance: Well nourished, well developed, and well hydrated. In no apparent acute distress Mental status: Alert, oriented x 3 (person, place, & time)       Respiratory: No evidence of acute respiratory distress Eyes: PERLA Vitals: LMP  (LMP Unknown)  BMI: Estimated body mass index is 26.43 kg/m as calculated from the following:   Height as of  08/15/22: 5' 7.5" (1.715 m).   Weight as of 08/15/22: 171 lb 4 oz (77.7 kg). Ideal: Ideal body weight: 62.7 kg (138 lb 5.4 oz) Adjusted ideal body weight: 68.7 kg (151 lb 8 oz)  Assessment   Diagnosis Status  1. Chronic pain syndrome   2. Chronic generalized pain (1ry area of Pain)   3. Fibromyalgia affecting multiple sites (2ry area of Pain)   4. Chronic shoulder pain (3ry area of Pain) (Bilateral) (R>L)   5. Chronic hip pain (4th area of Pain) (Bilateral) (R>L)   6. Osteoarthritis of hips (Bilateral)   7. Pharmacologic therapy   8. Chronic use of opiate for therapeutic purpose   9. Encounter for medication management   10. Encounter for chronic pain management    Controlled Controlled Controlled   Updated Problems: No problems updated.  Plan of Care  Problem-specific:  No problem-specific Assessment & Plan notes found for this encounter.  Wendy Valencia has a current medication list which includes the following long-term medication(s): duloxetine, duloxetine,  gabapentin, losartan-hydrochlorothiazide, quetiapine, and tramadol.  Pharmacotherapy (Medications Ordered): No orders of the defined types were placed in this encounter.  Orders:  No orders of the defined types were placed in this encounter.  Follow-up plan:   No follow-ups on file.      Interventional Therapies  Risk  Complexity Considerations:   WNL   Planned  Pending:      Under consideration:      Completed:   Diagnostic bilateral IA hip joint inj. x1 (06/25/2017) (100/100/70/75-100)  Diagnostic bilateral superficial trochanteric bursa inj. x1 (06/25/2017) (100/100/70/75-100)    Therapeutic  Palliative (PRN) options:   Therapeutic bilateral hip & TB inj. #2        Recent Visits No visits were found meeting these conditions. Showing recent visits within past 90 days and meeting all other requirements Future Appointments Date Type Provider Dept  08/22/22 Appointment Delano Metz, MD [facility]  Showing future appointments within next 90 days and meeting all other requirements  I discussed the assessment and treatment plan with the patient. The patient was provided an opportunity to ask questions and all were answered. The patient agreed with the plan and demonstrated an understanding of the instructions.  Patient advised to call back or seek an in-person evaluation if the symptoms or condition worsens.  Duration of encounter: *** minutes.  Total time on encounter, as per AMA guidelines included both the face-to-face and non-face-to-face time personally spent by the physician and/or other qualified health care professional(s) on the day of the encounter (includes time in activities that require the physician or other qualified health care professional and does not include time in activities normally performed by clinical staff). Physician's time may include the following activities when performed: Preparing to see the patient (e.g., pre-charting review  of records, searching for previously ordered imaging, lab work, and nerve conduction tests) Review of prior analgesic pharmacotherapies. Reviewing PMP Interpreting ordered tests (e.g., lab work, imaging, nerve conduction tests) Performing post-procedure evaluations, including interpretation of diagnostic procedures Obtaining and/or reviewing separately obtained history Performing a medically appropriate examination and/or evaluation Counseling and educating the patient/family/caregiver Ordering medications, tests, or procedures Referring and communicating with other health care professionals (when not separately reported) Documenting clinical information in the electronic or other health record Independently interpreting results (not separately reported) and communicating results to the patient/ family/caregiver Care coordination (not separately reported)  Note by: Oswaldo Done, MD Date: 08/22/2022; Time: 7:48 AM

## 2022-08-21 NOTE — Patient Instructions (Signed)
____________________________________________________________________________________________  Opioid Pain Medication Update  To: All patients taking opioid pain medications. (I.e.: hydrocodone, hydromorphone, oxycodone, oxymorphone, morphine, codeine, methadone, tapentadol, tramadol, buprenorphine, fentanyl, etc.)  Re: Updated review of side effects and adverse reactions of opioid analgesics, as well as new information about long term effects of this class of medications.  Direct risks of long-term opioid therapy are not limited to opioid addiction and overdose. Potential medical risks include serious fractures, breathing problems during sleep, hyperalgesia, immunosuppression, chronic constipation, bowel obstruction, myocardial infarction, and tooth decay secondary to xerostomia.  Unpredictable adverse effects that can occur even if you take your medication correctly: Cognitive impairment, respiratory depression, and death. Most people think that if they take their medication "correctly", and "as instructed", that they will be safe. Nothing could be farther from the truth. In reality, a significant amount of recorded deaths associated with the use of opioids has occurred in individuals that had taken the medication for a long time, and were taking their medication correctly. The following are examples of how this can happen: Patient taking his/her medication for a long time, as instructed, without any side effects, is given a certain antibiotic or another unrelated medication, which in turn triggers a "Drug-to-drug interaction" leading to disorientation, cognitive impairment, impaired reflexes, respiratory depression or an untoward event leading to serious bodily harm or injury, including death.  Patient taking his/her medication for a long time, as instructed, without any side effects, develops an acute impairment of liver and/or kidney function. This will lead to a rapid inability of the body to  breakdown and eliminate their pain medication, which will result in effects similar to an "overdose", but with the same medicine and dose that they had always taken. This again may lead to disorientation, cognitive impairment, impaired reflexes, respiratory depression or an untoward event leading to serious bodily harm or injury, including death.  A similar problem will occur with patients as they grow older and their liver and kidney function begins to decrease as part of the aging process.  Background information: Historically, the original case for using long-term opioid therapy to treat chronic noncancer pain was based on safety assumptions that subsequent experience has called into question. In 1996, the American Pain Society and the American Academy of Pain Medicine issued a consensus statement supporting long-term opioid therapy. This statement acknowledged the dangers of opioid prescribing but concluded that the risk for addiction was low; respiratory depression induced by opioids was short-lived, occurred mainly in opioid-naive patients, and was antagonized by pain; tolerance was not a common problem; and efforts to control diversion should not constrain opioid prescribing. This has now proven to be wrong. Experience regarding the risks for opioid addiction, misuse, and overdose in community practice has failed to support these assumptions.  According to the Centers for Disease Control and Prevention, fatal overdoses involving opioid analgesics have increased sharply over the past decade. Currently, more than 96,700 people die from drug overdoses every year. Opioids are a factor in 7 out of every 10 overdose deaths. Deaths from drug overdose have surpassed motor vehicle accidents as the leading cause of death for individuals between the ages of 80 and 61.  Clinical data suggest that neuroendocrine dysfunction may be very common in both men and women, potentially causing hypogonadism, erectile  dysfunction, infertility, decreased libido, osteoporosis, and depression. Recent studies linked higher opioid dose to increased opioid-related mortality. Controlled observational studies reported that long-term opioid therapy may be associated with increased risk for cardiovascular events. Subsequent meta-analysis concluded  that the safety of long-term opioid therapy in elderly patients has not been proven.   Side Effects and adverse reactions: Common side effects: Drowsiness (sedation). Dizziness. Nausea and vomiting. Constipation. Physical dependence -- Dependence often manifests with withdrawal symptoms when opioids are discontinued or decreased. Tolerance -- As you take repeated doses of opioids, you require increased medication to experience the same effect of pain relief. Respiratory depression -- This can occur in healthy people, especially with higher doses. However, people with COPD, asthma or other lung conditions may be even more susceptible to fatal respiratory impairment.  Uncommon side effects: An increased sensitivity to feeling pain and extreme response to pain (hyperalgesia). Chronic use of opioids can lead to this. Delayed gastric emptying (the process by which the contents of your stomach are moved into your small intestine). Muscle rigidity. Immune system and hormonal dysfunction. Quick, involuntary muscle jerks (myoclonus). Arrhythmia. Itchy skin (pruritus). Dry mouth (xerostomia).  Long-term side effects: Chronic constipation. Sleep-disordered breathing (SDB). Increased risk of bone fractures. Hypothalamic-pituitary-adrenal dysregulation. Increased risk of overdose.  RISKS: Respiratory depression and death: Opioids increase the risk of respiratory depression and death.  Drug-to-drug interactions: Opioids are relatively contraindicated in combination with benzodiazepines, sleep inducers, and other central nervous system depressants. Other classes of medications  (i.e.: certain antibiotics and even over-the-counter medications) may also trigger or induce respiratory depression in some patients.  Medical conditions: Patients with pre-existing respiratory problems are at higher risk of respiratory failure and/or depression when in combination with opioid analgesics. Opioids are relatively contraindicated in some medical conditions such as central sleep apnea.   Fractures and Falls:  Opioids increase the risk and incidence of falls. This is of particular importance in elderly patients.  Endocrine System:  Long-term administration is associated with endocrine abnormalities (endocrinopathies). (Also known as Opioid-induced Endocrinopathy) Influences on both the hypothalamic-pituitary-adrenal axis?and the hypothalamic-pituitary-gonadal axis have been demonstrated with consequent hypogonadism and adrenal insufficiency in both sexes. Hypogonadism and decreased levels of dehydroepiandrosterone sulfate have been reported in men and women. Endocrine effects include: Amenorrhoea in women (abnormal absence of menstruation) Reduced libido in both sexes Decreased sexual function Erectile dysfunction in men Hypogonadisms (decreased testicular function with shrinkage of testicles) Infertility Depression and fatigue Loss of muscle mass Anxiety Depression Immune suppression Hyperalgesia Weight gain Anemia Osteoporosis Patients (particularly women of childbearing age) should avoid opioids. There is insufficient evidence to recommend routine monitoring of asymptomatic patients taking opioids in the long-term for hormonal deficiencies.  Immune System: Human studies have demonstrated that opioids have an immunomodulating effect. These effects are mediated via opioid receptors both on immune effector cells and in the central nervous system. Opioids have been demonstrated to have adverse effects on antimicrobial response and anti-tumour surveillance. Buprenorphine has  been demonstrated to have no impact on immune function.  Opioid Induced Hyperalgesia: Human studies have demonstrated that prolonged use of opioids can lead to a state of abnormal pain sensitivity, sometimes called opioid induced hyperalgesia (OIH). Opioid induced hyperalgesia is not usually seen in the absence of tolerance to opioid analgesia. Clinically, hyperalgesia may be diagnosed if the patient on long-term opioid therapy presents with increased pain. This might be qualitatively and anatomically distinct from pain related to disease progression or to breakthrough pain resulting from development of opioid tolerance. Pain associated with hyperalgesia tends to be more diffuse than the pre-existing pain and less defined in quality. Management of opioid induced hyperalgesia requires opioid dose reduction.  Cancer: Chronic opioid therapy has been associated with an increased risk of cancer  among noncancer patients with chronic pain. This association was more evident in chronic strong opioid users. Chronic opioid consumption causes significant pathological changes in the small intestine and colon. Epidemiological studies have found that there is a link between opium dependence and initiation of gastrointestinal cancers. Cancer is the second leading cause of death after cardiovascular disease. Chronic use of opioids can cause multiple conditions such as GERD, immunosuppression and renal damage as well as carcinogenic effects, which are associated with the incidence of cancers.   Mortality: Long-term opioid use has been associated with increased mortality among patients with chronic non-cancer pain (CNCP).  Prescription of long-acting opioids for chronic noncancer pain was associated with a significantly increased risk of all-cause mortality, including deaths from causes other than overdose.  Reference: Von Korff M, Kolodny A, Deyo RA, Chou R. Long-term opioid therapy reconsidered. Ann Intern Med. 2011  Sep 6;155(5):325-8. doi: 10.7326/[id number]. PMID: 64403474; PMCID: [id number]. Randon Goldsmith, Hayward RA, Dunn KM, [place] KP. Risk of adverse events in patients prescribed long-term opioids: A cohort study in the [place] [facility] Research Datalink. Eur J Pain. 2019 May;23(5):908-922. doi: 10.1002/ejp.1357. Epub 2019 Jan 31. PMID: 43329518. Colameco S, Coren JS, Ciervo CA. Continuous opioid treatment for chronic noncancer pain: a time for moderation in prescribing. Postgrad Med. 2009 Jul;121(4):61-6. doi: 10.3810/pgm.2009.07.2032. PMID: 84166063. William Hamburger RN, [place] SD, Blazina I, Cristopher Peru, Bougatsos C, Deyo RA. The effectiveness and risks of long-term opioid therapy for chronic pain: a systematic review for a [employer] of [employer] Pathways to [employer]. Ann Intern Med. 2015 Feb 17;162(4):276-86. doi: 10.7326/M14-2559. PMID: 01601093. Caryl Bis [facility], Makuc DM. NCHS Data Brief No. 22. [place]: Centers for Disease Control and Prevention; 2009. Sep, Increase in Fatal Poisonings Involving Opioid Analgesics in the [place], 1999-2006. Song IA, Choi HR, Oh TK. Long-term opioid use and mortality in patients with chronic non-cancer pain: Ten-year follow-up study in [place] from 2010 through 2019. EClinicalMedicine. 2022 Jul 18;51:101558. doi: 10.1016/j.eclinm.2022.235573. PMID: 22025427; PMCID: [id number]. Huser, W., Schubert, T., Vogelmann, T. et al. All-cause mortality in patients with long-term opioid therapy compared with non-opioid analgesics for chronic non-cancer pain: a database study. BMC Med 18, 162 (2020). http://lester.info/ Rashidian H, Karie Kirks, Malekzadeh R, Haghdoost AA. An Ecological Study of the Association between Opiate Use and Incidence of Cancers. Addict Health. 2016 Fall;8(4):252-260. PMID: 51761607; PMCID: [id number].  Our Goal: Our goal is to control your  pain with means other than the use of opioid pain medications.  Our Recommendation: Talk to your physician about coming off of these medications. We can assist you with the tapering down and stopping these medicines. Based on the new information, even if you cannot completely stop the medication, a decrease in the dose may be associated with a lesser risk. Ask for other means of controlling the pain. Decrease or eliminate those factors that significantly contribute to your pain such as smoking, obesity, and a diet heavily tilted towards "inflammatory" nutrients.  Last Updated: 07/23/2022   ____________________________________________________________________________________________     ____________________________________________________________________________________________  National Pain Medication Shortage  The [place] is experiencing worsening drug shortages. These have had a negative widespread effect on patient care and treatment. Not expected to improve any time soon. Predicted to last past 2029.   Drug shortage list (generic names) Oxycodone IR Oxycodone/APAP Oxymorphone IR Hydromorphone Hydrocodone/APAP Morphine  Where is the problem?  Manufacturing and supply level.  Will this shortage affect you?  Only if you  take any of the above pain medications.  How? You may be unable to fill your prescription.  Your pharmacist may offer a "partial fill" of your prescription. (Warning: Do not accept partial fills.) Prescriptions partially filled cannot be transferred to another pharmacy. Read our Medication Rules and Regulation. Depending on how much medicine you are dependent on, you may experience withdrawals when unable to get the medication.  Recommendations: Consider ending your dependence on opioid pain medications. Ask your pain specialist to assist you with the process. Consider switching to a medication currently not in shortage, such as Buprenorphine. Talk to your pain  specialist about this option. Consider decreasing your pain medication requirements by managing tolerance thru "Drug Holidays". This may help minimize withdrawals, should you run out of medicine. Control your pain thru the use of non-pharmacological interventional therapies.   Your prescriber: Prescribers cannot be blamed for shortages. Medication manufacturing and supply issues cannot be fixed by the prescriber.   NOTE: The prescriber is not responsible for supplying the medication, or solving supply issues. Work with your pharmacist to solve it. The patient is responsible for the decision to take or continue taking the medication and for identifying and securing a legal supply source. By law, supplying the medication is the job and responsibility of the pharmacy. The prescriber is responsible for the evaluation, monitoring, and prescribing of these medications.   Prescribers will NOT: Re-issue prescriptions that have been partially filled. Re-issue prescriptions already sent to a pharmacy.  Re-send prescriptions to a different pharmacy because yours did not have your medication. Ask pharmacist to order more medicine or transfer the prescription to another pharmacy. (Read below.)  New 2023 regulation: "September 15, 2021 Revised Regulation Allows DEA-Registered Pharmacies to Transfer Electronic Prescriptions at a Patient's Request DEA Headquarters Division - Public Information Office Patients now have the ability to request their electronic prescription be transferred to another pharmacy without having to go back to their practitioner to initiate the request. This revised regulation went into effect on Monday, September 11, 2021.     At a patient's request, a DEA-registered retail pharmacy can now transfer an electronic prescription for a controlled substance (schedules II-V) to another DEA-registered retail pharmacy. Prior to this change, patients would have to go through their practitioner to  cancel their prescription and have it re-issued to a different pharmacy. The process was taxing and time consuming for both patients and practitioners.    The Drug Enforcement Administration [facility]) published its intent to revise the process for transferring electronic prescriptions on December 04, 2019.  The final rule was published in the federal register on August 10, 2021 and went into effect 30 days later.  Under the final rule, a prescription can only be transferred once between pharmacies, and only if allowed under existing state or other applicable law. The prescription must remain in its electronic form; may not be altered in any way; and the transfer must be communicated directly between two licensed pharmacists. It's important to note, any authorized refills transfer with the original prescription, which means the entire prescription will be filled at the same pharmacy".  Reference: HugeHand.is [facility] website announcement)  [email or web address] [employer] of Justice)   [employer] / Vol. 88, No. 143 / Thursday, August 10, 2021 / Rules and Regulations DEPARTMENT OF JUSTICE  Drug Enforcement Administration  21 CFR Part 1306  [Docket No. [id number]]  RIN [id number] Transfer of Electronic Prescriptions for Schedules II-V Controlled Substances Between Pharmacies for Initial Filling  ____________________________________________________________________________________________  ____________________________________________________________________________________________  Transfer of Pain Medication between Pharmacies  Re: 2023 DEA Clarification on existing regulation  Published on DEA Website: September 15, 2021  Title: Revised Regulation Allows DEA-Registered Pharmacies to Electrical engineer Prescriptions at a Patient's  Request DEA Headquarters Division - [employer]  "Patients now have the ability to request their electronic prescription be transferred to another pharmacy without having to go back to their practitioner to initiate the request. This revised regulation went into effect on Monday, September 11, 2021.     At a patient's request, a DEA-registered retail pharmacy can now transfer an electronic prescription for a controlled substance (schedules II-V) to another DEA-registered retail pharmacy. Prior to this change, patients would have to go through their practitioner to cancel their prescription and have it re-issued to a different pharmacy. The process was taxing and time consuming for both patients and practitioners.    The Drug Enforcement Administration [facility]) published its intent to revise the process for transferring electronic prescriptions on December 04, 2019.  The final rule was published in the federal register on August 10, 2021 and went into effect 30 days later.  Under the final rule, a prescription can only be transferred once between pharmacies, and only if allowed under existing state or other applicable law. The prescription must remain in its electronic form; may not be altered in any way; and the transfer must be communicated directly between two licensed pharmacists. It's important to note, any authorized refills transfer with the original prescription, which means the entire prescription will be filled at the same pharmacy."    REFERENCES: 1. DEA website announcement [email or web address]  2. Department of Justice website  [email or web address].pdf  3. DEPARTMENT OF JUSTICE Drug Enforcement Administration 21 CFR Part 1306 [Docket No. [id number]] RIN [id number] "Transfer of Electronic Prescriptions for Schedules II-V Controlled Substances  Between Pharmacies for Initial Filling"  ____________________________________________________________________________________________     _______________________________________________________________________  Medication Rules  Purpose: To inform patients, and their family members, of our medication rules and regulations.  Applies to: All patients receiving prescriptions from our practice (written or electronic).  Pharmacy of record: This is the pharmacy where your electronic prescriptions will be sent. Make sure we have the correct one.  Electronic prescriptions: In compliance with the [facility] Strengthen Opioid Misuse Prevention (STOP) Act of 2017 (Session Conni Elliot [phone]), effective January 15, 2018, all controlled substances must be electronically prescribed. Written prescriptions, faxing, or calling prescriptions to a pharmacy will no longer be done.  Prescription refills: These will be provided only during in-person appointments. No medications will be renewed without a "face-to-face" evaluation with your provider. Applies to all prescriptions.  NOTE: The following applies primarily to controlled substances (Opioid* Pain Medications).   Type of encounter (visit): For patients receiving controlled substances, face-to-face visits are required. (Not an option and not up to the patient.)  Patient's responsibilities: Pain Pills: Bring all pain pills to every appointment (except for procedure appointments). Pill Bottles: Bring pills in original pharmacy bottle. Bring bottle, even if empty. Always bring the bottle of the most recent fill.  Medication refills: You are responsible for knowing and keeping track of what medications you are taking and when is it that you will need a refill. The day before your appointment: write a list of all prescriptions that need to be refilled. The day of the appointment: give the list to the admitting nurse. Prescriptions will be written only  during appointments. No prescriptions will be written on procedure days. If you forget a  medication: it will not be "Called in", "Faxed", or "electronically sent". You will need to get another appointment to get these prescribed. No early refills. Do not call asking to have your prescription filled early. Partial  or short prescriptions: Occasionally your pharmacy may not have enough pills to fill your prescription.  NEVER ACCEPT a partial fill or a prescription that is short of the total amount of pills that you were prescribed.  With controlled substances the law allows 72 hours for the pharmacy to complete the prescription.  If the prescription is not completed within 72 hours, the pharmacist will require a new prescription to be written. This means that you will be short on your medicine and we WILL NOT send another prescription to complete your original prescription.  Instead, request the pharmacy to send a carrier to a nearby branch to get enough medication to provide you with your full prescription. Prescription Accuracy: You are responsible for carefully inspecting your prescriptions before leaving our office. Have the discharge nurse carefully go over each prescription with you, before taking them home. Make sure that your name is accurately spelled, that your address is correct. Check the name and dose of your medication to make sure it is accurate. Check the number of pills, and the written instructions to make sure they are clear and accurate. Make sure that you are given enough medication to last until your next medication refill appointment. Taking Medication: Take medication as prescribed. When it comes to controlled substances, taking less pills or less frequently than prescribed is permitted and encouraged. Never take more pills than instructed. Never take the medication more frequently than prescribed.  Inform other Doctors: Always inform, all of your healthcare providers, of all the  medications you take. Pain Medication from other Providers: You are not allowed to accept any additional pain medication from any other Doctor or Healthcare provider. There are two exceptions to this rule. (see below) In the event that you require additional pain medication, you are responsible for notifying us, as stated below. Cough Medicine: Often these contain an opioid, such as codeine or hydrocodone. Never accept or take cough medicine containing these opioids if you are already taking an opioid* medication. The combination may cause respiratory failure and death. Medication Agreement: You are responsible for carefully reading and following our Medication Agreement. This must be signed before receiving any prescriptions from our practice. Safely store a copy of your signed Agreement. Violations to the Agreement will result in no further prescriptions. (Additional copies of our Medication Agreement are available upon request.) Laws, Rules, & Regulations: All patients are expected to follow all [address] and [employer], [employer], Rules, [place] [place]. Ignorance of the Laws does not constitute a valid excuse.  Illegal drugs and Controlled Substances: The use of illegal substances (including, but not limited to marijuana and its derivatives) and/or the illegal use of any controlled substances is strictly prohibited. Violation of this rule may result in the immediate and permanent discontinuation of any and all prescriptions being written by our practice. The use of any illegal substances is prohibited. Adopted CDC guidelines & recommendations: Target dosing levels will be at or below 60 MME/day. Use of benzodiazepines** is not recommended.  Exceptions: There are only two exceptions to the rule of not receiving pain medications from other Healthcare Providers. Exception #1 (Emergencies): In the event of an emergency (i.e.: accident requiring emergency care), you are allowed to receive additional pain  medication. However, you are responsible for: As soon as  you are able, call our office [phone], at any time of the day or night, and leave a message stating your name, the date and nature of the emergency, and the name and dose of the medication prescribed. In the event that your call is answered by a member of our staff, make sure to document and save the date, time, and the name of the person that took your information.  Exception #2 (Planned Surgery): In the event that you are scheduled by another doctor or dentist to have any type of surgery or procedure, you are allowed (for a period no longer than 30 days), to receive additional pain medication, for the acute post-op pain. However, in this case, you are responsible for picking up a copy of our "Post-op Pain Management for Surgeons" handout, and giving it to your surgeon or dentist. This document is available at our office, and does not require an appointment to obtain it. Simply go to our office during business hours (Monday-Thursday from 8:00 AM to 4:00 PM) (Friday 8:00 AM to 12:00 Noon) or if you have a scheduled appointment with [place], prior to your surgery, and ask for it by name. In addition, you are responsible for: calling our office [phone], at any time of the day or night, and leaving a message stating your name, name of your surgeon, type of surgery, and date of procedure or surgery. Failure to comply with your responsibilities may result in termination of therapy involving the controlled substances. Medication Agreement Violation. Following the above rules, including your responsibilities will help you in avoiding a Medication Agreement Violation ("Breaking your Pain Medication Contract").  Consequences:  Not following the above rules may result in permanent discontinuation of medication prescription therapy.  *Opioid medications include: morphine, codeine, oxycodone, oxymorphone, hydrocodone, hydromorphone, meperidine, tramadol,  tapentadol, buprenorphine, fentanyl, methadone. **Benzodiazepine medications include: diazepam (Valium), alprazolam (Xanax), clonazepam (Klonopine), lorazepam (Ativan), clorazepate (Tranxene), chlordiazepoxide (Librium), estazolam (Prosom), oxazepam (Serax), temazepam (Restoril), triazolam (Halcion) (Last updated: 11/07/2021) ______________________________________________________________________    ______________________________________________________________________  Medication Recommendations and Reminders  Applies to: All patients receiving prescriptions (written and/or electronic).  Medication Rules & Regulations: You are responsible for reading, knowing, and following our "Medication Rules" document. These exist for your safety and that of others. They are not flexible and neither are we. Dismissing or ignoring them is an act of "non-compliance" that may result in complete and irreversible termination of such medication therapy. For safety reasons, "non-compliance" will not be tolerated. As with the [place] fundamental legal principle of "ignorance of the law is no defense", we will accept no excuses for not having read and knowing the content of documents provided to you by our practice.  Pharmacy of record:  Definition: This is the pharmacy where your electronic prescriptions will be sent.  We do not endorse any particular pharmacy. It is up to you and your insurance to decide what pharmacy to use.  We do not restrict you in your choice of pharmacy. However, once we write for your prescriptions, we will NOT be re-sending more prescriptions to fix restricted supply problems created by your pharmacy, or your insurance.  The pharmacy listed in the electronic medical record should be the one where you want electronic prescriptions to be sent. If you choose to change pharmacy, simply notify our nursing staff. Changes will be made only during your regular appointments and not over the  phone.  Recommendations: Keep all of your pain medications in a safe place, under lock and key, even  if you live alone. We will NOT replace lost, stolen, or damaged medication. We do not accept "Police Reports" as proof of medications having been stolen. After you fill your prescription, take 1 week's worth of pills and put them away in a safe place. You should keep a separate, properly labeled bottle for this purpose. The remainder should be kept in the original bottle. Use this as your primary supply, until it runs out. Once it's gone, then you know that you have 1 week's worth of medicine, and it is time to come in for a prescription refill. If you do this correctly, it is unlikely that you will ever run out of medicine. To make sure that the above recommendation works, it is very important that you make sure your medication refill appointments are scheduled at least 1 week before you run out of medicine. To do this in an effective manner, make sure that you do not leave the office without scheduling your next medication management appointment. Always ask the nursing staff to show you in your prescription , when your medication will be running out. Then arrange for the receptionist to get you a return appointment, at least 7 days before you run out of medicine. Do not wait until you have 1 or 2 pills left, to come in. This is very poor planning and does not take into consideration that we may need to cancel appointments due to bad weather, sickness, or emergencies affecting our staff. DO NOT ACCEPT A "Partial Fill": If for any reason your pharmacy does not have enough pills/tablets to completely fill or refill your prescription, do not allow for a "partial fill". The law allows the pharmacy to complete that prescription within 72 hours, without requiring a new prescription. If they do not fill the rest of your prescription within those 72 hours, you will need a separate prescription to fill the remaining  amount, which we will NOT provide. If the reason for the partial fill is your insurance, you will need to talk to the pharmacist about payment alternatives for the remaining tablets, but again, DO NOT ACCEPT A PARTIAL FILL, unless you can trust your pharmacist to obtain the remainder of the pills within 72 hours.  Prescription refills and/or changes in medication(s):  Prescription refills, and/or changes in dose or medication, will be conducted only during scheduled medication management appointments. (Applies to both, written and electronic prescriptions.) No refills on procedure days. No medication will be changed or started on procedure days. No changes, adjustments, and/or refills will be conducted on a procedure day. Doing so will interfere with the diagnostic portion of the procedure. No phone refills. No medications will be "called into the pharmacy". No Fax refills. No weekend refills. No Holliday refills. No after hours refills.  Remember:  Business hours are:  Monday to Thursday 8:00 AM to 4:00 PM Provider's Schedule: Delano Metz, MD - Appointments are:  Medication management: Monday and Wednesday 8:00 AM to 4:00 PM Procedure day: Tuesday and Thursday 7:30 AM to 4:00 PM Edward Jolly, MD - Appointments are:  Medication management: Tuesday and Thursday 8:00 AM to 4:00 PM Procedure day: Monday and Wednesday 7:30 AM to 4:00 PM (Last update: 11/07/2021) ______________________________________________________________________   ____________________________________________________________________________________________  Naloxone Nasal Spray  Why am I receiving this medication? [place] STOP ACT requires that all patients taking high dose opioids or at risk of opioids respiratory depression, be prescribed an opioid reversal agent, such as Naloxone (AKA: Narcan).  What is this medication? NALOXONE (  nal OX one) treats opioid overdose, which causes slow or shallow breathing,  severe drowsiness, or trouble staying awake. Call emergency services after using this medication. You may need additional treatment. Naloxone works by reversing the effects of opioids. It belongs to a group of medications called opioid blockers.  COMMON BRAND NAME(S): Kloxxado, Narcan  What should I tell my care team before I take this medication? They need to know if you have any of these conditions: Heart disease Substance use disorder An unusual or allergic reaction to naloxone, other medications, foods, dyes, or preservatives Pregnant or trying to get pregnant Breast-feeding  When to use this medication? This medication is to be used for the treatment of respiratory depression (less than 8 breaths per minute) secondary to opioid overdose.   How to use this medication? This medication is for use in the nose. Lay the person on their back. Support their neck with your hand and allow the head to tilt back before giving the medication. The nasal spray should be given into 1 nostril. After giving the medication, move the person onto their side. Do not remove or test the nasal spray until ready to use. Get emergency medical help right away after giving the first dose of this medication, even if the person wakes up. You should be familiar with how to recognize the signs and symptoms of a narcotic overdose. If more doses are needed, give the additional dose in the other nostril. Talk to your care team about the use of this medication in children. While this medication may be prescribed for children as young as newborns for selected conditions, precautions do apply.  Naloxone Overdosage: If you think you have taken too much of this medicine contact a poison control center or emergency room at once.  NOTE: This medicine is only for you. Do not share this medicine with others.  What if I miss a dose? This does not apply.  What may interact with this medication? This is only used during an  emergency. No interactions are expected during emergency use. This list may not describe all possible interactions. Give your health care provider a list of all the medicines, herbs, non-prescription drugs, or dietary supplements you use. Also tell them if you smoke, drink alcohol, or use illegal drugs. Some items may interact with your medicine.  What should I watch for while using this medication? Keep this medication ready for use in the case of an opioid overdose. Make sure that you have the phone number of your care team and local hospital ready. You may need to have additional doses of this medication. Each nasal spray contains a single dose. Some emergencies may require additional doses. After use, bring the treated person to the nearest hospital or call 911. Make sure the treating care team knows that the person has received a dose of this medication. You will receive additional instructions on what to do during and after use of this medication before an emergency occurs.  What side effects may I notice from receiving this medication? Side effects that you should report to your care team as soon as possible: Allergic reactions--skin rash, itching, hives, swelling of the face, lips, tongue, or throat Side effects that usually do not require medical attention (report these to your care team if they continue or are bothersome): Constipation Dryness or irritation inside the nose Headache Increase in blood pressure Muscle spasms Stuffy nose Toothache This list may not describe all possible side effects. Call your doctor for  medical advice about side effects. You may report side effects to FDA at 1-800-FDA-1088.  Where should I keep my medication? Because this is an emergency medication, you should keep it with you at all times.  Keep out of the reach of children and pets. Store between 20 and 25 degrees C (68 and 77 degrees F). Do not freeze. Throw away any unused medication after the  expiration date. Keep in original box until ready to use.  NOTE: This sheet is a summary. It may not cover all possible information. If you have questions about this medicine, talk to your doctor, pharmacist, or health care provider.   2023 Elsevier/Gold Standard (2020-09-09 00:00:00)  ____________________________________________________________________________________________

## 2022-08-22 ENCOUNTER — Encounter: Payer: Self-pay | Admitting: Pain Medicine

## 2022-08-22 ENCOUNTER — Ambulatory Visit: Payer: BC Managed Care – PPO | Attending: Pain Medicine | Admitting: Pain Medicine

## 2022-08-22 VITALS — BP 115/72 | HR 81 | Temp 97.3°F | Ht 67.0 in | Wt 171.0 lb

## 2022-08-22 DIAGNOSIS — M25552 Pain in left hip: Secondary | ICD-10-CM | POA: Insufficient documentation

## 2022-08-22 DIAGNOSIS — G8929 Other chronic pain: Secondary | ICD-10-CM | POA: Diagnosis not present

## 2022-08-22 DIAGNOSIS — M160 Bilateral primary osteoarthritis of hip: Secondary | ICD-10-CM | POA: Diagnosis not present

## 2022-08-22 DIAGNOSIS — Z79899 Other long term (current) drug therapy: Secondary | ICD-10-CM

## 2022-08-22 DIAGNOSIS — M25511 Pain in right shoulder: Secondary | ICD-10-CM | POA: Insufficient documentation

## 2022-08-22 DIAGNOSIS — G894 Chronic pain syndrome: Secondary | ICD-10-CM | POA: Diagnosis not present

## 2022-08-22 DIAGNOSIS — M25551 Pain in right hip: Secondary | ICD-10-CM | POA: Diagnosis not present

## 2022-08-22 DIAGNOSIS — Z79891 Long term (current) use of opiate analgesic: Secondary | ICD-10-CM | POA: Diagnosis not present

## 2022-08-22 DIAGNOSIS — M797 Fibromyalgia: Secondary | ICD-10-CM

## 2022-08-22 DIAGNOSIS — R52 Pain, unspecified: Secondary | ICD-10-CM | POA: Diagnosis not present

## 2022-08-22 DIAGNOSIS — M25512 Pain in left shoulder: Secondary | ICD-10-CM | POA: Diagnosis not present

## 2022-08-22 MED ORDER — NALOXONE HCL 4 MG/0.1ML NA LIQD
1.0000 | NASAL | 0 refills | Status: AC | PRN
Start: 2022-08-22 — End: 2023-08-22

## 2022-08-22 MED ORDER — TRAMADOL HCL 50 MG PO TABS: 50.0000 mg | ORAL_TABLET | Freq: Four times a day (QID) | ORAL | 5 refills | Status: DC | PRN

## 2022-08-22 NOTE — Progress Notes (Signed)
Nursing Pain Medication Assessment:  Safety precautions to be maintained throughout the outpatient stay will include: orient to surroundings, keep bed in low position, maintain call bell within reach at all times, provide assistance with transfer out of bed and ambulation.  Medication Inspection Compliance: Pill count conducted under aseptic conditions, in front of the patient. Neither the pills nor the bottle was removed from the patient's sight at any time. Once count was completed pills were immediately returned to the patient in their original bottle.  Medication: Tramadol (Ultram) Pill/Patch Count:  65 of 120 pills remain Pill/Patch Appearance: Markings consistent with prescribed medication Bottle Appearance: Standard pharmacy container. Clearly labeled. Filled Date: 7 / 23 / 2024 Last Medication intake:  TodaySafety precautions to be maintained throughout the outpatient stay will include: orient to surroundings, keep bed in low position, maintain call bell within reach at all times, provide assistance with transfer out of bed and ambulation.

## 2022-08-27 ENCOUNTER — Other Ambulatory Visit: Payer: Self-pay | Admitting: Nurse Practitioner

## 2022-08-27 DIAGNOSIS — M797 Fibromyalgia: Secondary | ICD-10-CM

## 2022-09-03 ENCOUNTER — Encounter: Payer: BC Managed Care – PPO | Admitting: Gastroenterology

## 2022-09-10 ENCOUNTER — Encounter: Payer: Self-pay | Admitting: Gastroenterology

## 2022-09-17 ENCOUNTER — Encounter: Payer: Self-pay | Admitting: Certified Registered Nurse Anesthetist

## 2022-09-20 ENCOUNTER — Encounter: Payer: Self-pay | Admitting: Gastroenterology

## 2022-09-20 ENCOUNTER — Ambulatory Visit (AMBULATORY_SURGERY_CENTER): Payer: BC Managed Care – PPO | Admitting: Gastroenterology

## 2022-09-20 VITALS — BP 133/78 | HR 72 | Temp 97.3°F | Resp 20 | Ht 67.5 in | Wt 171.0 lb

## 2022-09-20 DIAGNOSIS — R6881 Early satiety: Secondary | ICD-10-CM | POA: Diagnosis not present

## 2022-09-20 DIAGNOSIS — K5900 Constipation, unspecified: Secondary | ICD-10-CM

## 2022-09-20 DIAGNOSIS — R194 Change in bowel habit: Secondary | ICD-10-CM

## 2022-09-20 DIAGNOSIS — K219 Gastro-esophageal reflux disease without esophagitis: Secondary | ICD-10-CM

## 2022-09-20 DIAGNOSIS — K289 Gastrojejunal ulcer, unspecified as acute or chronic, without hemorrhage or perforation: Secondary | ICD-10-CM | POA: Diagnosis not present

## 2022-09-20 DIAGNOSIS — K2950 Unspecified chronic gastritis without bleeding: Secondary | ICD-10-CM | POA: Diagnosis not present

## 2022-09-20 DIAGNOSIS — K9189 Other postprocedural complications and disorders of digestive system: Secondary | ICD-10-CM | POA: Diagnosis not present

## 2022-09-20 DIAGNOSIS — B9681 Helicobacter pylori [H. pylori] as the cause of diseases classified elsewhere: Secondary | ICD-10-CM | POA: Diagnosis not present

## 2022-09-20 DIAGNOSIS — Z1211 Encounter for screening for malignant neoplasm of colon: Secondary | ICD-10-CM

## 2022-09-20 MED ORDER — SODIUM CHLORIDE 0.9 % IV SOLN: 500.0000 mL | Freq: Once | INTRAVENOUS | Status: DC

## 2022-09-20 NOTE — Progress Notes (Signed)
History & Physical  Primary Care Physician:  Lucky Cowboy, MD Primary Gastroenterologist: Claudette Head, MD  Impression / Plan:  Change in bowel habits, constipation, early satiety and GERD for colonoscopy and EGD.  CHIEF COMPLAINT: Change in bowel habits, constipation, early satiety, GERD  HPI: Wendy Valencia is a 57 y.o. female with change in bowel habits, constipation, early satiety and GERD for colonoscopy and EGD.   Past Medical History:  Diagnosis Date   Allergy    Anemia    Anxiety    Arthritis    Fibromyalgia    GERD (gastroesophageal reflux disease)    Hypertension    IBS (irritable bowel syndrome)    Nephrolithiasis    Plantar fasciitis of right foot    wears boot     Past Surgical History:  Procedure Laterality Date   BREATH TEK H PYLORI  07/11/2011   Procedure: BREATH TEK H PYLORI;  Surgeon: Mariella Saa, MD;  Location: [facility];  Service: General;  Laterality: N/A;   CHOLECYSTECTOMY  10/01/2011   Procedure: LAPAROSCOPIC CHOLECYSTECTOMY WITH INTRAOPERATIVE CHOLANGIOGRAM;  Surgeon: Mariella Saa, MD;  Location: [facility];  Service: General;  Laterality: N/A;   COLONOSCOPY     GASTRIC ROUX-EN-Y  10/01/2011   Procedure: LAPAROSCOPIC ROUX-EN-Y GASTRIC;  Surgeon: Mariella Saa, MD;  Location: [facility];  Service: General;  Laterality: N/A;   Left shoulder arthroscopic surgery Left    bursectomy, in her early 81s   tummy tuck  2005    Prior to Admission medications   Medication Sig Start Date End Date Taking? Authorizing Provider  Aspirin-Acetaminophen-Caffeine (EXCEDRIN EXTRA STRENGTH PO) Take 1 tablet by mouth 3 (three) times daily as needed (headache).    Yes [provider]  betamethasone dipropionate (DIPROLENE) 0.05 % ointment APPLY TO THE AFFECTED AREA(S) 2 TIMES A DAY 07/10/22  Yes Raynelle Dick, NP  diphenhydrAMINE (BENADRYL) 25 MG tablet Take 25-50 mg by mouth 3 (three) times daily as needed for allergies.   Yes  [provider]  DULoxetine (CYMBALTA) 30 MG capsule TAKE 1 CAPSULE BY MOUTH DAILY WITH 60 MG CAPSULE 07/09/22  Yes Raynelle Dick, NP  DULoxetine (CYMBALTA) 60 MG capsule TAKE 1 CAPSULE BY MOUTH DAILY WITH 30 MG 07/09/22  Yes Raynelle Dick, NP  gabapentin (NEURONTIN) 600 MG tablet TAKE 1 TABLET BY MOUTH 3 TIMES A DAY THEN TAKE 1 AND 1/2 TABLETS BY MOUTH EVERY NIGHT AT BEDTIME AS DIRECTED 08/27/22  Yes Raynelle Dick, NP  IBU 600 MG tablet Take 600 mg by mouth every 6 (six) hours as needed. 02/12/22  Yes [provider]  losartan-hydrochlorothiazide (HYZAAR) 100-25 MG tablet TAKE ONE TABLET BY MOUTH DAILY 03/12/22  Yes Raynelle Dick, NP  MAGNESIUM PO Take 1,000 mg by mouth daily at 6 (six) AM.   Yes [provider]  Multiple Vitamins-Minerals (MULTIVITAMIN WITH MINERALS) tablet Take 1 tablet by mouth daily. .   Yes [provider]  QUEtiapine (SEROQUEL) 25 MG tablet TAKE THREE TABLETS  EVERY NIGHT AT BEDTIME                                                                 /  TAKE                                          BY                                        MOUTH 06/09/22  Yes Lucky Cowboy, MD  Semaglutide-Weight Management [facility]) 2.4 MG/0.75ML SOAJ INJECT 2.4 MG UNDER THE SKIN ONCE WEEKLY 08/15/22  Yes Raynelle Dick, NP  traMADol (ULTRAM) 50 MG tablet Take 1 tablet (50 mg total) by mouth every 6 (six) hours as needed for severe pain. Each refill must last 30 days. 08/30/22 02/26/23 Yes Delano Metz, MD  valACYclovir (VALTREX) 1000 MG tablet Take 1 tablet Daily for "cold sores                                                                    /                                                                   TAKE                                         BY                                                 MOUTH 08/08/22  Yes Lucky Cowboy, MD  Vitamin D, Ergocalciferol, (DRISDOL) 1.25 MG (50000 UNIT) CAPS capsule TAKE 1  CAPSULE TWO TIMES A WEEK 07/19/20  Yes Judd Gaudier, NP  cetirizine-pseudoephedrine (ZYRTEC-D) 5-120 MG tablet Take 1 tablet by mouth every 12 (twelve) hours as needed for allergies.     [provider]  Glucosamine-Chondroitin (COSAMIN DS PO) Take 1,500 mg by mouth daily.    [provider]  Iron-FA-B Cmp-C-Biot-Probiotic (FUSION PLUS) CAPS Take 1 cap every 2-3 days as tolerated for chronic iron deficiency. 09/03/20   Judd Gaudier, NP  naloxone [facility]) nasal spray 4 mg/0.1 mL Place 1 spray into the nose as needed for up to 365 doses (for opioid-induced respiratory depresssion). In case of emergency (overdose), spray once into each nostril. If no response within 3 minutes, repeat application and call 911. 08/22/22 08/22/23  Delano Metz, MD  NOVOFINE 32G X 6 MM MISC Needs daily for victoza 10/06/18   Doree Albee, PA-C    Current Outpatient Medications  Medication Sig Dispense Refill   Aspirin-Acetaminophen-Caffeine (EXCEDRIN EXTRA STRENGTH PO) Take 1 tablet by mouth 3 (three) times daily as needed (headache).      betamethasone dipropionate (DIPROLENE) 0.05 % ointment APPLY TO THE AFFECTED AREA(S) 2 TIMES  A DAY 30 g 0   diphenhydrAMINE (BENADRYL) 25 MG tablet Take 25-50 mg by mouth 3 (three) times daily as needed for allergies.     DULoxetine (CYMBALTA) 30 MG capsule TAKE 1 CAPSULE BY MOUTH DAILY WITH 60 MG CAPSULE 30 capsule 1   DULoxetine (CYMBALTA) 60 MG capsule TAKE 1 CAPSULE BY MOUTH DAILY WITH 30 MG 30 capsule 1   gabapentin (NEURONTIN) 600 MG tablet TAKE 1 TABLET BY MOUTH 3 TIMES A DAY THEN TAKE 1 AND 1/2 TABLETS BY MOUTH EVERY NIGHT AT BEDTIME AS DIRECTED 135 tablet 1   IBU 600 MG tablet Take 600 mg by mouth every 6 (six) hours as needed.     losartan-hydrochlorothiazide (HYZAAR) 100-25 MG tablet TAKE ONE TABLET BY MOUTH DAILY 30 tablet 11   MAGNESIUM PO Take 1,000 mg by mouth daily at 6 (six) AM.     Multiple Vitamins-Minerals (MULTIVITAMIN WITH MINERALS)  tablet Take 1 tablet by mouth daily. .     QUEtiapine (SEROQUEL) 25 MG tablet TAKE THREE TABLETS  EVERY NIGHT AT BEDTIME                                                                 /                      TAKE                                          BY                                        MOUTH 270 tablet 1   Semaglutide-Weight Management (WEGOVY) 2.4 MG/0.75ML SOAJ INJECT 2.4 MG UNDER THE SKIN ONCE WEEKLY 3 mL 5   traMADol (ULTRAM) 50 MG tablet Take 1 tablet (50 mg total) by mouth every 6 (six) hours as needed for severe pain. Each refill must last 30 days. 120 tablet 5   valACYclovir (VALTREX) 1000 MG tablet Take 1 tablet Daily for "cold sores                                                                    /                                                                   TAKE                                         BY  MOUTH 90 tablet 1   Vitamin D, Ergocalciferol, (DRISDOL) 1.25 MG (50000 UNIT) CAPS capsule TAKE 1 CAPSULE TWO TIMES A WEEK 26 capsule 3   cetirizine-pseudoephedrine (ZYRTEC-D) 5-120 MG tablet Take 1 tablet by mouth every 12 (twelve) hours as needed for allergies.      Glucosamine-Chondroitin (COSAMIN DS PO) Take 1,500 mg by mouth daily.     Iron-FA-B Cmp-C-Biot-Probiotic (FUSION PLUS) CAPS Take 1 cap every 2-3 days as tolerated for chronic iron deficiency. 30 capsule 0   naloxone (NARCAN) nasal spray 4 mg/0.1 mL Place 1 spray into the nose as needed for up to 365 doses (for opioid-induced respiratory depresssion). In case of emergency (overdose), spray once into each nostril. If no response within 3 minutes, repeat application and call 911. 1 each 0   NOVOFINE 32G X 6 MM MISC Needs daily for victoza 100 each 5   Current Facility-Administered Medications  Medication Dose Route Frequency Provider Last Rate Last Admin   0.9 %  sodium chloride infusion  500 mL Intravenous Once Meryl Dare, MD        Allergies as of  09/20/2022   (No Known Allergies)    Family History  Problem Relation Age of Onset   Breast cancer Mother 63       breast   Ovarian cancer Mother 40       primary or mets from breast?   Heart disease Father    Hypertension Father    Stroke Father    Obesity Brother    Breast cancer Maternal Aunt 35       died at age 79   Multiple sclerosis Maternal Uncle    Prostate cancer Paternal Uncle        mets   Lung cancer Maternal Grandmother        dx >50   Breast cancer Maternal Grandmother        dx > 50   Heart attack Maternal Grandfather    Heart disease Maternal Grandfather    Heart attack Paternal Grandfather    Breast cancer Cousin        dx early 49s   Colon cancer Neg Hx    Colon polyps Neg Hx    Esophageal cancer Neg Hx    Rectal cancer Neg Hx     Social History   Socioeconomic History   Marital status: Married    Spouse name: Not on file   Number of children: 1   Years of education: Not on file   Highest education level: Not on file  Occupational History    Comment: Scientist, water quality for [employer]  Tobacco Use   Smoking status: Some Days    Current packs/day: 0.15    Average packs/day: 0.2 packs/day for 33.7 years (5.1 ttl pk-yrs)    Types: Cigarettes    Start date: 62   Smokeless tobacco: Never  Vaping Use   Vaping status: Never Used  Substance and Sexual Activity   Alcohol use: Yes    Alcohol/week: 1.0 standard drink of alcohol    Types: 1 Glasses of wine per week    Comment: 1x week   Drug use: No   Sexual activity: Yes    Partners: Male    Birth control/protection: Post-menopausal    Comment: 1st intercourse- 18, partners-?, married-  Other Topics Concern   Not on file  Social History Narrative   Not on file   Social Determinants of Health   Financial Resource Strain: Not on file  Food Insecurity: Not on file  Transportation Needs: Not on file  Physical Activity: Not on file  Stress: Not on file  Social Connections: Not on file   Intimate Partner Violence: Not on file    Review of Systems:  All systems reviewed were negative except where noted in HPI.   Physical Exam:  General:  Alert, well-developed, in NAD Head:  Normocephalic and atraumatic. Eyes:  Sclera clear, no icterus.   Conjunctiva pink. Ears:  Normal auditory acuity. Mouth:  No deformity or lesions.  Neck:  Supple; no masses. Lungs:  Clear throughout to auscultation.   No wheezes, crackles, or rhonchi.  Heart:  Regular rate and rhythm; no murmurs. Abdomen:  Soft, nondistended, nontender. No masses, hepatomegaly. No palpable masses.  Normal bowel sounds.    Rectal:  Deferred   Msk:  Symmetrical without gross deformities. Extremities:  Without edema. Neurologic:  Alert and  oriented x 4; grossly normal neurologically. Skin:  Intact without significant lesions or rashes. Psych:  Alert and cooperative. Normal mood and affect.   Venita Lick. Russella Dar  09/20/2022, 2:54 PM See Loretha Stapler, [facility], to contact our on call provider

## 2022-09-20 NOTE — Patient Instructions (Signed)
-   await pathology results -repeat colonoscopy in 6 months for surveillance due to poor prep   -Continue present medications    YOU HAD AN ENDOSCOPIC PROCEDURE TODAY AT THE [facility]:   Refer to the procedure report that was given to you for any specific questions about what was found during the examination.  If the procedure report does not answer your questions, please call your gastroenterologist to clarify.  If you requested that your care partner not be given the details of your procedure findings, then the procedure report has been included in a sealed envelope for you to review at your convenience later.  YOU SHOULD EXPECT: Some feelings of bloating in the abdomen. Passage of more gas than usual.  Walking can help get rid of the air that was put into your GI tract during the procedure and reduce the bloating. If you had a lower endoscopy (such as a colonoscopy or flexible sigmoidoscopy) you may notice spotting of blood in your stool or on the toilet paper. If you underwent a bowel prep for your procedure, you may not have a normal bowel movement for a few days.  Please Note:  You might notice some irritation and congestion in your nose or some drainage.  This is from the oxygen used during your procedure.  There is no need for concern and it should clear up in a day or so.  SYMPTOMS TO REPORT IMMEDIATELY:  Following lower endoscopy (colonoscopy or flexible sigmoidoscopy):  Excessive amounts of blood in the stool  Significant tenderness or worsening of abdominal pains  Swelling of the abdomen that is new, acute  Fever of 100F or higher  Following upper endoscopy (EGD)  Vomiting of blood or coffee ground material  New chest pain or pain under the shoulder blades  Painful or persistently difficult swallowing  New shortness of breath  Fever of 100F or higher  Black, tarry-looking stools  For urgent or emergent issues, a gastroenterologist can be reached at any hour by  calling [phone]. Do not use MyChart messaging for urgent concerns.    DIET:  We do recommend a small meal at first, but then you may proceed to your regular diet.  Drink plenty of fluids but you should avoid alcoholic beverages for 24 hours.  ACTIVITY:  You should plan to take it easy for the rest of today and you should NOT DRIVE or use heavy machinery until tomorrow (because of the sedation medicines used during the test).    FOLLOW UP: Our staff will call the number listed on your records the next business day following your procedure.  We will call around 7:15- 8:00 am to check on you and address any questions or concerns that you may have regarding the information given to you following your procedure. If we do not reach you, we will leave a message.     If any biopsies were taken you will be contacted by phone or by letter within the next 1-3 weeks.  Please call us at [phone] if you have not heard about the biopsies in 3 weeks.    SIGNATURES/CONFIDENTIALITY: You and/or your care partner have signed paperwork which will be entered into your electronic medical record.  These signatures attest to the fact that that the information above on your After Visit Summary has been reviewed and is understood.  Full responsibility of the confidentiality of this discharge information lies with you and/or your care-partner.

## 2022-09-20 NOTE — Progress Notes (Signed)
Called to room to assist during endoscopic procedure.  Patient ID and intended procedure confirmed with present staff. Received instructions for my participation in the procedure from the performing physician.  

## 2022-09-20 NOTE — Progress Notes (Deleted)
Complete Physical  Assessment and Plan:  Encounter for Annual Physical Exam with abnormal findings Due annually  Health Maintenance reviewed Healthy lifestyle reviewed and goals set Mammogram UTD 05/25/21 Negative Going to gynecology for pap and breast exam  Essential hypertension - discussed goal <130/80, increase zestoretic, 4-6 week follow up - DASH diet, exercise and monitor at home. Call if greater than 130/80.  -     CBC with Differential/Platelet -     COMPLETE METABOLIC PANEL WITH GFR -     TSH -     Urinalysis, Routine w reflex microscopic -     Microalbumin / creatinine urine ratio  Mixed hyperlipidemia check lipids decrease fatty foods increase activity.  -     Lipid panel  Abnormal Glucose On semaglutide Discussed disease progression and risks Discussed diet/exercise, weight management and risk modification -     Hemoglobin A1c  Fibromyalgia affecting multiple sites (Secondary Area of Pain) Continue follow up pain management Continue Gabapentin, duloxetine and Tramadol  Chronic pain syndrome Continue follow up pain management Continue Gabapentin, duloxetine and Tramadol  Long term current use of opiate analgesic Continue follow up pain management  Constipation, secondary to opioid Trulance, linzess causes diarrhea at times Would like to try motegrity; discussed and sent in  Continue squatty potty, fiber, water  Tobacco abuse Discussed risks associated with tobacco use and advised to reduce or quit Patient is currently smoking 1 pack a week Declines Chantix or other medication Will follow up at the next visit  Prolonged QT on ECG Refer to cardiology for evaluation If confirmed by cardiology will need to discuss pain medications and possible changes with pain medications  Anxiety state Continue meds; well controlled Continue therapy as needed  Insomnia secondary to chronic pain  Insomnia- good sleep hygiene discussed, increase day time  activity Continue medications  Medication management -     Magnesium  Vitamin D deficiency -     VITAMIN D 25 Hydroxy (Vit-D Deficiency, Fractures)  Overweight with co morbidities - BMI 29, Doing very well with wegovy Long discussion about weight loss, diet, and exercise Discussed ideal weight for height  Patient will work on increasing exercise, fiber in diet Will follow up in 1 month  S/P gastric bypass, screening nutritional deficiencies -     Iron, Total/Total Iron Binding Cap -     Vitamin B12      Discussed med's effects and SE's. Screening labs and tests as requested with regular follow-up as recommended. Over 40 minutes of exam, counseling, chart review, and complex, high level critical decision making was performed this visit.   Future Appointments  Date Time Provider Department Center  09/20/2022  3:00 PM Meryl Dare, MD [facility]  09/24/2022  3:00 PM Raynelle Dick, NP [facility] None  02/20/2023 10:40 AM Delano Metz, MD [facility] None  09/25/2023  3:00 PM Raynelle Dick, NP [facility] None     HPI  57 y.o. female  presents for a complete physical. She has Anxiety state; Essential hypertension; Tobacco abuse; Medication management; Vitamin D deficiency; S/P gastric bypass; Cervical spondylosis without myelopathy; Fibromyalgia affecting multiple sites (2ry area of Pain); Spondylosis of lumbar spine; Chronic generalized pain (1ry area of Pain); Chronic pain syndrome; Long term current use of opiate analgesic; Chronic shoulder pain (3ry area of Pain) (Bilateral) (R>L); Chronic hip pain (4th area of Pain) (Bilateral) (R>L); Greater trochanteric bursitis of hips (Bilateral); Osteoarthritis of hips (Bilateral); Insomnia secondary to chronic pain; Abnormal glucose; Hyperlipidemia; Genetic testing;  Uncomplicated opioid dependence (HCC); Lumbar facet syndrome (Right); Spondylosis without myelopathy or radiculopathy, lumbosacral region; Chronic hip pain  (Left); Chronic low back pain (Right) w/o sciatica; Iron deficiency; Overweight (BMI 25.0-29.9); Abnormal drug screen (08/25/2020); and Marijuana use on their problem list.  She is married, has a son and step children, no grandchildren. 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 Enjoys her job. 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   She has chronic pain, DJD, fibromyalgia and follows with pain clinic, Dr. Shauna Hugh. She is taking tramadol, gabapentin, excedrine, motrin managed by him. She will still have flares. Continues to follow with pain medication  She also has hx of anxiety and insomnia; had negative sleep study for organic causes, deemed chronic insomnia secondary to pain. She is prescribed cymbalta 90 mg, seroquel 75 mg QHS. She has followed with therapy, reports doing very well recently.   BMI is There is no height or weight on file to calculate BMI., she is working on diet and exercise. s/p gastric bypass Sept 2013. She regained weight up to 255 lb 08/2018. She failed phentermine/topamax, fair results with saxenda but transitioned to [facility] for higher dose, has done well since, down 70 lb since that time. Does have constipation, had diarrhea with linzess, trulance (also bloating), would like to try motegrity.  Wt Readings from Last 3 Encounters:  08/22/22 171 lb (77.6 kg)  08/15/22 171 lb 4 oz (77.7 kg)  08/08/22 170 lb (77.1 kg)     She is having constipation and is using Magnesium but taking 2000 mg daily.  Takes 500mg  1 in am 1 at lunch and 2 at bedtime.  She has been a little more regular with the high dose of Magnesium. She has been noticing Bms are much thinner.  Has GI appointment tomorrow with Dr. Russella Dar. She will occasionally feel like meat will get stuck, difficulty digesting.   Her blood pressure has been controlled at home, today their BP is    BP Readings from Last 3 Encounters:  08/22/22 115/72  08/15/22 114/66  03/30/22 128/80  She does workout, slowly increasing, walking. She  denies chest pain, shortness of breath, dizziness.  She is not on cholesterol medication and denies myalgias. Her cholesterol is at goal. The cholesterol last visit was:   Lab Results  Component Value Date   CHOL 147 03/30/2022   HDL 70 03/30/2022   LDLCALC 62 03/30/2022   TRIG 71 03/30/2022   CHOLHDL 2.1 03/30/2022   She has been working on diet and exercise for prediabetes, and denies paresthesia of the feet, polydipsia, polyuria and visual disturbances. Last A1C in the office was:  Lab Results  Component Value Date   HGBA1C 5.5 03/30/2022   Patient is on Vitamin D supplement.   Lab Results  Component Value Date   VD25OH 44 09/20/2021     She has had elevated alk phos with normal Ab [place], GGT and bone scan in 2001. Has had negative autoimmune workup in the past.   She reports stopped iron supplement 2 months ago, was having constipation.  Lab Results  Component Value Date   IRON 62 09/20/2021   TIBC 404 09/20/2021   FERRITIN 7 (L) 09/20/2021   Lab Results  Component Value Date   VITAMINB12 577 09/20/2021     Current Medications:  Current Outpatient Medications on File Prior to Visit  Medication Sig Dispense Refill   Aspirin-Acetaminophen-Caffeine (EXCEDRIN EXTRA STRENGTH PO) Take 1 tablet by mouth 3 (three) times daily as needed (headache).  betamethasone dipropionate (DIPROLENE) 0.05 % ointment APPLY TO THE AFFECTED AREA(S) 2 TIMES A DAY 30 g 0   cetirizine-pseudoephedrine (ZYRTEC-D) 5-120 MG tablet Take 1 tablet by mouth every 12 (twelve) hours as needed for allergies.      diphenhydrAMINE (BENADRYL) 25 MG tablet Take 25-50 mg by mouth 3 (three) times daily as needed for allergies.     DULoxetine (CYMBALTA) 30 MG capsule TAKE 1 CAPSULE BY MOUTH DAILY WITH 60 MG CAPSULE 30 capsule 1   DULoxetine (CYMBALTA) 60 MG capsule TAKE 1 CAPSULE BY MOUTH DAILY WITH 30 MG 30 capsule 1   gabapentin (NEURONTIN) 600 MG tablet TAKE 1 TABLET BY MOUTH 3 TIMES A DAY THEN TAKE 1 AND 1/2  TABLETS BY MOUTH EVERY NIGHT AT BEDTIME AS DIRECTED 135 tablet 1   Glucosamine-Chondroitin (COSAMIN DS PO) Take 1,500 mg by mouth daily.     IBU 600 MG tablet Take 600 mg by mouth every 6 (six) hours as needed.     Iron-FA-B Cmp-C-Biot-Probiotic (FUSION PLUS) CAPS Take 1 cap every 2-3 days as tolerated for chronic iron deficiency. 30 capsule 0   losartan-hydrochlorothiazide (HYZAAR) 100-25 MG tablet TAKE ONE TABLET BY MOUTH DAILY 30 tablet 11   MAGNESIUM PO Take 1,000 mg by mouth daily at 6 (six) AM.     Multiple Vitamins-Minerals (MULTIVITAMIN WITH MINERALS) tablet Take 1 tablet by mouth daily. .     naloxone (NARCAN) nasal spray 4 mg/0.1 mL Place 1 spray into the nose as needed for up to 365 doses (for opioid-induced respiratory depresssion). In case of emergency (overdose), spray once into each nostril. If no response within 3 minutes, repeat application and call 911. 1 each 0   NOVOFINE 32G X 6 MM MISC Needs daily for victoza 100 each 5   QUEtiapine (SEROQUEL) 25 MG tablet TAKE THREE TABLETS  EVERY NIGHT AT BEDTIME                                                                 /                      TAKE                                          BY                                        MOUTH 270 tablet 1   Semaglutide-Weight Management (WEGOVY) 2.4 MG/0.75ML SOAJ INJECT 2.4 MG UNDER THE SKIN ONCE WEEKLY 3 mL 5   traMADol (ULTRAM) 50 MG tablet Take 1 tablet (50 mg total) by mouth every 6 (six) hours as needed for severe pain. Each refill must last 30 days. 120 tablet 5   valACYclovir (VALTREX) 1000 MG tablet Take 1 tablet Daily for "cold sores                                                                    /  TAKE                                         BY                                                 MOUTH 90 tablet 1   Vitamin D, Ergocalciferol, (DRISDOL) 1.25 MG (50000 UNIT) CAPS capsule TAKE 1 CAPSULE TWO TIMES A WEEK 26  capsule 3   No current facility-administered medications on file prior to visit.   Health Maintenance:   Immunization History  Administered Date(s) Administered   Influenza Inj Mdck Quad With Preservative 10/23/2016, 12/14/2019   Influenza Split 10/02/2011, 10/16/2013, 11/15/2014   Influenza,inj,Quad PF,6+ Mos 11/08/2020, 11/01/2021   PFIZER(Purple Top)SARS-COV-2 Vaccination 04/03/2019, 04/29/2019   Pneumococcal Polysaccharide-23 10/02/2011   Tdap 08/30/2016    Tetanus: 2018 Pneumovax: 2013 Prevnar 13: N/A Flu vaccine: 11/2019, recommended in Oct Shingrix: reports has had, will send dates Covid 22: 2/2, pfizer - has had booster  LMP: post menopausal Pap: 08/2016 NEG HPV, due next year MGM: 03/04/2020 DEXA: 09/2013 -  Colonoscopy: 04/2017 normal EGD: 2013 Myoview stress test 10/2010 Sleep study 05/2018  Last Dental Exam: Dr. Edward Jolly Last Eye Exam: Dr Dorothea Ogle DERM Dr. Berneice Gandy, OB/GYN  Patient Care Team: Lucky Cowboy, MD as PCP - General (Internal Medicine) Meryl Dare, MD as Consulting Physician (Gastroenterology) Lucky Cowboy, MD as Referring Physician (Internal Medicine)  Medical History:  Past Medical History:  Diagnosis Date   Allergy    Anemia    Anxiety    Arthritis    Fibromyalgia    GERD (gastroesophageal reflux disease)    Hypertension    IBS (irritable bowel syndrome)    Nephrolithiasis    Plantar fasciitis of right foot    wears boot    Allergies No Known Allergies  SURGICAL HISTORY She  has a past surgical history that includes Left shoulder arthroscopic surgery (Left); tummy tuck (2005); Breath tek h pylori (07/11/2011); Gastric Roux-En-Y (10/01/2011); Cholecystectomy (10/01/2011); and Colonoscopy.   FAMILY HISTORY Her family history includes Breast cancer in her cousin and maternal grandmother; Breast cancer (age of onset: 72) in her mother; Breast cancer (age of onset: 34) in her maternal aunt; Heart attack in her maternal  grandfather and paternal grandfather; Heart disease in her father and maternal grandfather; Hypertension in her father; Lung cancer in her maternal grandmother; Multiple sclerosis in her maternal uncle; Obesity in her brother; Ovarian cancer (age of onset: 67) in her mother; Prostate cancer in her paternal uncle; Stroke in her father.   SOCIAL HISTORY She  reports that she has been smoking cigarettes. She started smoking about 33 years ago. She has a 5.1 pack-year smoking history. She has never used smokeless tobacco. She reports current alcohol use of about 1.0 standard drink of alcohol per week. She reports that she does not use drugs.   Review of Systems: Review of Systems  Constitutional:  Negative for chills, diaphoresis, fever, malaise/fatigue and weight loss.  HENT:  Negative for hearing loss and tinnitus.   Eyes: Negative.  Negative for blurred vision and double vision.  Respiratory: Negative.  Negative for cough, sputum production, shortness of breath and wheezing.   Cardiovascular: Negative.  Negative for chest pain, palpitations,  orthopnea, claudication, leg swelling and PND.  Gastrointestinal:  Positive for constipation. Negative for abdominal pain, blood in stool, diarrhea, heartburn, melena, nausea and vomiting.  Genitourinary: Negative.   Musculoskeletal:  Positive for back pain, joint pain, myalgias and neck pain. Negative for falls.  Skin: Negative.  Negative for rash.  Neurological: Negative.  Negative for dizziness, tingling, sensory change, weakness and headaches.  Endo/Heme/Allergies:  Negative for polydipsia.  Psychiatric/Behavioral: Negative.  Negative for depression, memory loss, substance abuse and suicidal ideas. The patient is not nervous/anxious and does not have insomnia.   All other systems reviewed and are negative.   Physical Exam: Estimated body mass index is 26.78 kg/m as calculated from the following:   Height as of 08/22/22: 5\' 7"  (1.702 m).   Weight as of  08/22/22: 171 lb (77.6 kg). LMP  (LMP Unknown)  General Appearance: Well nourished, in no apparent distress.  Eyes: PERRLA, EOMs, conjunctiva no swelling or erythema Sinuses: No Frontal/maxillary tenderness  ENT/Mouth: Ext aud canals clear, normal light reflex with TMs without erythema, bulging. Good dentition. No erythema, swelling, or exudate on post pharynx. Tonsils not swollen or erythematous. Hearing normal.  Neck: Supple, thyroid normal. No bruits  Respiratory: Respiratory effort normal, BS equal bilaterally without rales, rhonchi, wheezing or stridor.  Cardio: RRR without murmurs, rubs or gallops. Brisk peripheral pulses without edema.  Chest: symmetric, with normal excursions and percussion.  Breasts: defer, no concerns, getting mammograms Abdomen: Soft, nontender, no guarding, rebound, hernias, masses, or organomegaly.  Lymphatics: Non tender without lymphadenopathy.  Genitourinary: defer Musculoskeletal: Full ROM all peripheral extremities,5/5 strength, and normal gait.  Skin: Warm, dry without rashes, lesions, ecchymosis. Neuro: Cranial nerves intact, reflexes equal bilaterally. Normal muscle tone, no cerebellar symptoms. Sensation intact.  Psych: Awake and oriented X 3, normal affect, Insight and Judgment appropriate.   EKG:  NSR with Prolonged QT  Dyneisha Murchison Hollie Salk, NP 1:01 PM [facility]

## 2022-09-20 NOTE — Op Note (Signed)
[facility] Patient Name: Wendy Valencia Procedure Date: 09/20/2022 2:57 PM MRN: [id number] Endoscopist: Meryl Dare , MD, [phone] Age: 57 Referring MD:  Date of Birth: 11-04-1965 Gender: Female Account #: [id number] Procedure:                Upper GI endoscopy Indications:              Gastroesophageal reflux disease, Early satiety Medicines:                Monitored Anesthesia Care Procedure:                Pre-Anesthesia Assessment:                           - Prior to the procedure, a History and Physical                            was performed, and patient medications and                            allergies were reviewed. The patient's tolerance of                            previous anesthesia was also reviewed. The risks                            and benefits of the procedure and the sedation                            options and risks were discussed with the patient.                            All questions were answered, and informed consent                            was obtained. Prior Anticoagulants: The patient has                            taken no anticoagulant or antiplatelet agents. ASA                            Grade Assessment: II - A patient with mild systemic                            disease. After reviewing the risks and benefits,                            the patient was deemed in satisfactory condition to                            undergo the procedure.                           After obtaining informed consent, the endoscope was  passed under direct vision. Throughout the                            procedure, the patient's blood pressure, pulse, and                            oxygen saturations were monitored continuously. The                            Olympus Scope [id number] was introduced through the                            mouth, and advanced to the afferent and efferent                             jejunal loops. The upper GI endoscopy was                            accomplished without difficulty. The patient                            tolerated the procedure well. Scope In: 3:03:34 PM Scope Out: 3:31:35 PM Scope Withdrawal Time: 0 hours 17 minutes 51 seconds  Total Procedure Duration: 0 hours 28 minutes 1 second  Findings:                 The examined esophagus was normal.                           Evidence of a Roux-en-Y gastrojejunostomy was                            found. The gastrojejunal anastomosis was                            characterized by two small erosions. This was                            traversed. The excluded stomach was not examined as                            it could not be found. Biopsies were taken with a                            cold forceps for histology.                           The examined afferent and efferent jejunum limbs                            were normal. Complications:            No immediate complications. Estimated Blood Loss:     Estimated blood loss was minimal. Impression:               - Normal esophagus.                           -  Roux-en-Y gastrojejunostomy with gastrojejunal                            anastomosis characterized by erosion. Biopsied.                           - Normal examined jejunum. Recommendation:           - Patient has a contact number available for                            emergencies. The signs and symptoms of potential                            delayed complications were discussed with the                            patient. Return to normal activities tomorrow.                            Written discharge instructions were provided to the                            patient.                           - Resume previous diet.                           - Follow antireflux measures.                           - Continue present medications.                           - Nexium 20 mg OTC po qd.                            - Await pathology results.                           - Consider discontinuing Wegovy. Meryl Dare, MD 09/20/2022 4:26:19 PM This report has been signed electronically.

## 2022-09-20 NOTE — Progress Notes (Signed)
1458 Robinul 0.1 mg IV given due large amount of secretions upon assessment.  MD made aware, vss

## 2022-09-20 NOTE — Op Note (Signed)
[facility] Patient Name: Wendy Valencia Procedure Date: 09/20/2022 2:57 PM MRN: [id number] Endoscopist: Meryl Dare , MD, [phone] Age: 57 Referring MD:  Date of Birth: 1965/04/09 Gender: Female Account #: [id number] Procedure:                Colonoscopy Indications:              Change in bowel habits, Constipation Medicines:                Monitored Anesthesia Care Procedure:                Pre-Anesthesia Assessment:                           - Prior to the procedure, a History and Physical                            was performed, and patient medications and                            allergies were reviewed. The patient's tolerance of                            previous anesthesia was also reviewed. The risks                            and benefits of the procedure and the sedation                            options and risks were discussed with the patient.                            All questions were answered, and informed consent                            was obtained. Prior Anticoagulants: The patient has                            taken no anticoagulant or antiplatelet agents. ASA                            Grade Assessment: II - A patient with mild systemic                            disease. After reviewing the risks and benefits,                            the patient was deemed in satisfactory condition to                            undergo the procedure.                           After obtaining informed consent, the colonoscope  was passed under direct vision. Throughout the                            procedure, the patient's blood pressure, pulse, and                            oxygen saturations were monitored continuously. The                            Olympus [id number] ([id number]) Colonoscope was                            introduced through the anus and advanced to the the                            cecum, identified by  appendiceal orifice and                            ileocecal valve. The ileocecal valve, appendiceal                            orifice, and rectum were photographed. The quality                            of the bowel preparation was inadequate despite                            extensive lavage and suction. The colonoscopy was                            performed without difficulty. The patient tolerated                            the procedure well. Scope In: Scope Out: Findings:                 The perianal and digital rectal examinations were                            normal.                           A large amount of semi-liquid stool was found in                            the entire colon, interfering with visualization.                           The exam was otherwise without abnormality on                            direct and retroflexion views. Complications:            No immediate complications. Estimated blood loss:  None. Estimated Blood Loss:     Estimated blood loss: none. Impression:               - Preparation of the colon was inadequate.                           - Stool in the entire examined colon.                           - The examination was otherwise normal on direct                            and retroflexion views.                           - No specimens collected. Recommendation:           - Repeat colonoscopy in 6 months because the bowel                            preparation was suboptimal with a more extensive                            bowel prep.                           - Patient has a contact number available for                            emergencies. The signs and symptoms of potential                            delayed complications were discussed with the                            patient. Return to normal activities tomorrow.                            Written discharge instructions were provided to the                             patient.                           - Resume previous diet.                           - Continue present medications.                           - Consider discontinuing Wegovy. Meryl Dare, MD 09/20/2022 4:21:26 PM This report has been signed electronically.

## 2022-09-20 NOTE — Progress Notes (Signed)
Report given to PACU, vss 

## 2022-09-20 NOTE — Progress Notes (Signed)
I have reviewed the patient's medical history in detail and updated the computerized patient record.

## 2022-09-21 ENCOUNTER — Telehealth: Payer: Self-pay

## 2022-09-21 NOTE — Telephone Encounter (Signed)
  Follow up Call-     09/20/2022    2:20 PM  Call back number  Post procedure Call Back phone  # [phone]  Permission to leave phone message Yes     Patient questions:  Do you have a fever, pain , or abdominal swelling? No. Pain Score  0 *  Have you tolerated food without any problems? Yes.    Have you been able to return to your normal activities? Yes.    Do you have any questions about your discharge instructions: Diet   No. Medications  NO  Follow up visit  No.  Do you have questions or concerns about your Care? No.  Actions: * If pain score is 4 or above: No action needed, pain <4.

## 2022-09-23 ENCOUNTER — Other Ambulatory Visit: Payer: Self-pay | Admitting: Nurse Practitioner

## 2022-09-23 DIAGNOSIS — M797 Fibromyalgia: Secondary | ICD-10-CM

## 2022-09-24 ENCOUNTER — Encounter: Payer: BC Managed Care – PPO | Admitting: Nurse Practitioner

## 2022-09-24 DIAGNOSIS — G8929 Other chronic pain: Secondary | ICD-10-CM

## 2022-09-24 DIAGNOSIS — Z1389 Encounter for screening for other disorder: Secondary | ICD-10-CM

## 2022-09-24 DIAGNOSIS — Z1329 Encounter for screening for other suspected endocrine disorder: Secondary | ICD-10-CM

## 2022-09-24 DIAGNOSIS — F411 Generalized anxiety disorder: Secondary | ICD-10-CM

## 2022-09-24 DIAGNOSIS — Z9884 Bariatric surgery status: Secondary | ICD-10-CM

## 2022-09-24 DIAGNOSIS — Z79891 Long term (current) use of opiate analgesic: Secondary | ICD-10-CM

## 2022-09-24 DIAGNOSIS — K5903 Drug induced constipation: Secondary | ICD-10-CM

## 2022-09-24 DIAGNOSIS — M797 Fibromyalgia: Secondary | ICD-10-CM

## 2022-09-24 DIAGNOSIS — G894 Chronic pain syndrome: Secondary | ICD-10-CM

## 2022-09-24 DIAGNOSIS — E663 Overweight: Secondary | ICD-10-CM

## 2022-09-24 DIAGNOSIS — Z136 Encounter for screening for cardiovascular disorders: Secondary | ICD-10-CM

## 2022-09-24 DIAGNOSIS — Z79899 Other long term (current) drug therapy: Secondary | ICD-10-CM

## 2022-09-24 DIAGNOSIS — Z720 Tobacco use: Secondary | ICD-10-CM

## 2022-09-24 DIAGNOSIS — E785 Hyperlipidemia, unspecified: Secondary | ICD-10-CM

## 2022-09-24 DIAGNOSIS — R9431 Abnormal electrocardiogram [ECG] [EKG]: Secondary | ICD-10-CM

## 2022-09-24 DIAGNOSIS — I10 Essential (primary) hypertension: Secondary | ICD-10-CM

## 2022-09-24 DIAGNOSIS — R7309 Other abnormal glucose: Secondary | ICD-10-CM

## 2022-09-24 DIAGNOSIS — Z0001 Encounter for general adult medical examination with abnormal findings: Secondary | ICD-10-CM

## 2022-09-24 DIAGNOSIS — E559 Vitamin D deficiency, unspecified: Secondary | ICD-10-CM

## 2022-09-26 LAB — SURGICAL PATHOLOGY

## 2022-09-27 ENCOUNTER — Encounter: Payer: Self-pay | Admitting: Nurse Practitioner

## 2022-09-30 ENCOUNTER — Other Ambulatory Visit: Payer: Self-pay | Admitting: Nurse Practitioner

## 2022-09-30 DIAGNOSIS — L309 Dermatitis, unspecified: Secondary | ICD-10-CM

## 2022-10-02 ENCOUNTER — Other Ambulatory Visit: Payer: Self-pay | Admitting: *Deleted

## 2022-10-02 ENCOUNTER — Encounter: Payer: Self-pay | Admitting: *Deleted

## 2022-10-02 DIAGNOSIS — A048 Other specified bacterial intestinal infections: Secondary | ICD-10-CM

## 2022-10-02 MED ORDER — DOXYCYCLINE HYCLATE 100 MG PO TABS: 100.0000 mg | ORAL_TABLET | Freq: Two times a day (BID) | ORAL | 0 refills | Status: AC

## 2022-10-02 MED ORDER — METRONIDAZOLE 250 MG PO TABS: 250.0000 mg | ORAL_TABLET | Freq: Four times a day (QID) | ORAL | 0 refills | Status: AC

## 2022-10-02 MED ORDER — OMEPRAZOLE 40 MG PO CPDR: 40.0000 mg | DELAYED_RELEASE_CAPSULE | Freq: Two times a day (BID) | ORAL | 0 refills | Status: AC

## 2022-10-02 MED ORDER — BISMUTH SUBSALICYLATE 262 MG PO CHEW: 524.0000 mg | CHEWABLE_TABLET | Freq: Four times a day (QID) | ORAL | 0 refills | Status: AC

## 2022-10-02 NOTE — Progress Notes (Deleted)
d, 14d

## 2022-10-09 ENCOUNTER — Other Ambulatory Visit: Payer: Self-pay | Admitting: Gastroenterology

## 2022-10-09 NOTE — Progress Notes (Unsigned)
Complete Physical  Assessment and Plan:  Encounter for Annual Physical Exam with abnormal findings Due annually  Health Maintenance reviewed Healthy lifestyle reviewed and goals set Mammogram UTD 06/26/22- negative repeat in 1 year Going to gynecology for pap and breast exam- PAP WNL 11/01/21  Essential hypertension - discussed goal <130/80, continue Hyzaar 100/25 mg 1 tab daily - DASH diet, exercise and monitor at home. Call if greater than 130/80.  -     CBC with Differential/Platelet -     COMPLETE METABOLIC PANEL WITH GFR -     TSH -     Urinalysis, Routine w reflex microscopic -     Microalbumin / creatinine urine ratio  Mixed hyperlipidemia decrease fatty foods increase activity.  -     Lipid panel  Abnormal Glucose Stop Semaglutide and Start Zepbound 10 mg SQ QW Discussed disease progression and risks Discussed diet/exercise, weight management and risk modification -     Hemoglobin A1c  Fibromyalgia affecting multiple sites (Secondary Area of Pain) Continue follow up pain management- will need to get Gabapentin through pain management as it is now considered a controlled substance and should not be taken with Tramadol Continue Gabapentin, duloxetine and Tramadol  Chronic pain syndrome Continue follow up pain management-will need to get Gabapentin through pain management as it is now considered a controlled substance and should not be taken with Tramadol Continue Gabapentin, duloxetine and Tramadol  Long term current use of opiate analgesic Continue follow up pain management  Constipation, secondary to opioid 4 Magnesium a day Continue squatty potty, fiber, water  Memory changes/Decreased focus Refer to [facility] [employer].   Tobacco abuse Discussed risks associated with tobacco use and advised to reduce or quit Patient is currently smoking 1 pack a week Declines Chantix or other medication Will follow up at the next visit  Anxiety  state Continue meds; well controlled Continue therapy as needed  Insomnia secondary to chronic pain  Insomnia- good sleep hygiene discussed, increase day time activity Continue medications   Vitamin D deficiency Continue Vit D supplementation to maintain value in therapeutic level of 60-100  -     VITAMIN D 25 Hydroxy (Vit-D Deficiency, Fractures)  Overweight with co morbidities - BMI 27 She has plateau on Wegovy , Start on Zepbound 10 mg SQ QW Long discussion about weight loss, diet, and exercise Discussed ideal weight for height  Patient will work on increasing exercise, fiber in diet Will follow up in 1 month  S/P gastric bypass, screening nutritional deficiencies -     Iron, Total/Total Iron Binding Cap -     Vitamin B12  H. Pylori Discovered on endoscopy/colonoscopy with Dr. Russella Dar , continue to follow with GI Started 10/03/22 on Prilosec 40 mg BID x 14 Flagyl 250 mg QID X 14 Doxycycline 100 mg BID x 14  Medication management -     CBC with Differential/Platelet -     COMPLETE METABOLIC PANEL WITH GFR -     Magnesium -     Lipid panel -     TSH -     Hemoglobin A1C w/out eAG -     VITAMIN D 25 Hydroxy (Vit-D Deficiency, Fractures) -     EKG 12-Lead -     [place], RETROPERITNL ABD,  LTD -     Urinalysis, Routine w reflex microscopic -     Microalbumin / creatinine urine ratio -     Iron, TIBC and Ferritin Panel  Screening for ischemic heart disease -  EKG 12-Lead  Screening for hematuria or proteinuria -     Urinalysis, Routine w reflex microscopic -     Microalbumin / creatinine urine ratio  Screening for thyroid disorder -     TSH  Screening for AAA (abdominal aortic aneurysm) -     [place], RETROPERITNL ABD,  LTD  Encounter for screening for nutritional disorder -     Iron, TIBC and Ferritin Panel -     Vitamin B12  Memory changes/Difficulty Focusing Referred to [facility] for evaluation for ADD  Sun-induced skin changes, keratosis -      triamcinolone ointment (KENALOG) 0.1 %; Apply 1 Application topically 2 (two) times daily.       Discussed med's effects and SE's. Screening labs and tests as requested with regular follow-up as recommended. Over 40 minutes of exam, counseling, chart review, and complex, high level critical decision making was performed this visit.   Future Appointments  Date Time Provider Department Center  02/20/2023 10:40 AM Delano Metz, MD [facility] None  10/11/2023 10:30 AM Raynelle Dick, NP [facility] None     HPI  57 y.o. female  presents for a complete physical. She has Anxiety state; Essential hypertension; Tobacco abuse; Medication management; Vitamin D deficiency; S/P gastric bypass; Cervical spondylosis without myelopathy; Fibromyalgia affecting multiple sites (2ry area of Pain); Spondylosis of lumbar spine; Chronic generalized pain (1ry area of Pain); Chronic pain syndrome; Long term current use of opiate analgesic; Chronic shoulder pain (3ry area of Pain) (Bilateral) (R>L); Chronic hip pain (4th area of Pain) (Bilateral) (R>L); Greater trochanteric bursitis of hips (Bilateral); Osteoarthritis of hips (Bilateral); Insomnia secondary to chronic pain; Abnormal glucose; Hyperlipidemia; Genetic testing; Uncomplicated opioid dependence (HCC); Lumbar facet syndrome (Right); Spondylosis without myelopathy or radiculopathy, lumbosacral region; Chronic hip pain (Left); Chronic low back pain (Right) w/o sciatica; Iron deficiency; Overweight (BMI 25.0-29.9); Abnormal drug screen (08/25/2020); and Marijuana use on their problem list.  She is married, has a son and step children, no grandchildren. 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 Enjoys her job. 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   She has chronic pain, DJD, fibromyalgia and follows with pain clinic, Dr. Shauna Hugh. Last visit was 08/22/22. She is taking tramadol, gabapentin, cymbalta.  She will still have flares. Continues to follow with pain  management. Believes fibromyalgia is getting worse. Mood is getting worse due to pain  She also has hx of anxiety and insomnia; had negative sleep study for organic causes, deemed chronic insomnia secondary to pain. She is prescribed cymbalta 90 mg, seroquel 75 mg QHS. She has followed with therapy, reports doing very well recently.   BMI is Body mass index is 27.28 kg/m., she is working on diet and exercise. s/p gastric bypass Sept 2013. She regained weight up to 255 lb 08/2018. She failed phentermine/topamax, fair results with saxenda but transitioned to [facility] for higher dose, has done well since, down 70 lb since that time. Does have constipation, had diarrhea with linzess, trulance (also bloating). Wt Readings from Last 3 Encounters:  10/10/22 174 lb 3.2 oz (79 kg)  09/20/22 171 lb (77.6 kg)  08/22/22 171 lb (77.6 kg)   She uses Betamethasone for sun poisoning skin condition  She is having constipation and is using Magnesium but taking 2000 mg daily.  Takes 500mg  1 in am 1 at lunch and 2 at bedtime.  She has been a little more regular with the high dose of Magnesium. She has been noticing Bms are much thinner.  Has been following with GI- saw Boone Master NP 08/15/22 and stated constipation was controlled with sea moss. She had endoscopy/colonoscopy 09/20/22 with Dr. Russella Dar . H. Pylori diagnosed and is currently on Prilosec 40 mg BID x 14, Flagyl 250 mg QID X 14, Doxycycline 100 mg BID x 14. She is taking Pepto bismol 4 times a day with thins medication.   Her blood pressure has been controlled on Hyzaar 100/25 mg every day , today their BP is BP: 102/62  BP Readings from Last 3 Encounters:  10/10/22 102/62  09/20/22 133/78  08/22/22 115/72  She does workout, slowly increasing, walking. She denies chest pain, shortness of breath, dizziness.  She is not on cholesterol medication and denies myalgias. Her cholesterol is at goal. The cholesterol last visit was:   Lab Results  Component Value  Date   CHOL 147 03/30/2022   HDL 70 03/30/2022   LDLCALC 62 03/30/2022   TRIG 71 03/30/2022   CHOLHDL 2.1 03/30/2022   She has been working on diet and exercise for prediabetes, and denies paresthesia of the feet, polydipsia, polyuria and visual disturbances. Last A1C in the office was:  Lab Results  Component Value Date   HGBA1C 5.5 03/30/2022   Patient is on Vitamin D supplement.   Lab Results  Component Value Date   VD25OH 44 09/20/2021     She has had elevated alk phos with normal Ab [place], GGT and bone scan in 2001. Has had negative autoimmune workup in the past.   She reports she is not taking iron supplement, was having constipation.  Lab Results  Component Value Date   IRON 62 09/20/2021   TIBC 404 09/20/2021   FERRITIN 7 (L) 09/20/2021   Lab Results  Component Value Date   VITAMINB12 577 09/20/2021     Current Medications:  Current Outpatient Medications on File Prior to Visit  Medication Sig Dispense Refill   Aspirin-Acetaminophen-Caffeine (EXCEDRIN EXTRA STRENGTH PO) Take 1 tablet by mouth 3 (three) times daily as needed (headache).      betamethasone dipropionate (DIPROLENE) 0.05 % ointment APPLY TO THE AFFECTED AREA(S) 2 TIMES A DAY 30 g 0   bismuth subsalicylate (PEPTO-BISMOL) 262 MG chewable tablet Chew 2 tablets (524 mg total) by mouth 4 (four) times daily for 14 days. 112 tablet 0   cetirizine-pseudoephedrine (ZYRTEC-D) 5-120 MG tablet Take 1 tablet by mouth every 12 (twelve) hours as needed for allergies.      diphenhydrAMINE (BENADRYL) 25 MG tablet Take 25-50 mg by mouth 3 (three) times daily as needed for allergies.     doxycycline (VIBRA-TABS) 100 MG tablet Take 1 tablet (100 mg total) by mouth 2 (two) times daily for 14 days. 28 tablet 0   DULoxetine (CYMBALTA) 30 MG capsule TAKE 1 CAPSULE BY MOUTH DAILY WITH 60MG  CAPSULE 30 capsule 1   DULoxetine (CYMBALTA) 60 MG capsule TAKE 1 CAPSULE BY MOUTH DAILY WITH 30MG  30 capsule 1   gabapentin (NEURONTIN) 600 MG  tablet TAKE 1 TABLET BY MOUTH 3 TIMES A DAY THEN TAKE 1 AND 1/2 TABLETS BY MOUTH EVERY NIGHT AT BEDTIME AS DIRECTED 135 tablet 1   Glucosamine-Chondroitin (COSAMIN DS PO) Take 1,500 mg by mouth daily.     IBU 600 MG tablet Take 600 mg by mouth every 6 (six) hours as needed.     Iron-FA-B Cmp-C-Biot-Probiotic (FUSION PLUS) CAPS Take 1 cap every 2-3 days as tolerated for chronic iron deficiency. 30 capsule 0   losartan-hydrochlorothiazide (HYZAAR) 100-25 MG tablet  TAKE ONE TABLET BY MOUTH DAILY 30 tablet 11   MAGNESIUM PO Take 1,000 mg by mouth daily at 6 (six) AM.     metroNIDAZOLE (FLAGYL) 250 MG tablet Take 1 tablet (250 mg total) by mouth 4 (four) times daily for 14 days. 56 tablet 0   Multiple Vitamins-Minerals (MULTIVITAMIN WITH MINERALS) tablet Take 1 tablet by mouth daily. .     naloxone (NARCAN) nasal spray 4 mg/0.1 mL Place 1 spray into the nose as needed for up to 365 doses (for opioid-induced respiratory depresssion). In case of emergency (overdose), spray once into each nostril. If no response within 3 minutes, repeat application and call 911. 1 each 0   NOVOFINE 32G X 6 MM MISC Needs daily for victoza 100 each 5   omeprazole (PRILOSEC) 40 MG capsule Take 1 capsule (40 mg total) by mouth 2 (two) times daily for 14 days. 28 capsule 0   QUEtiapine (SEROQUEL) 25 MG tablet TAKE THREE TABLETS  EVERY NIGHT AT BEDTIME                                                                 /                      TAKE                                          BY                                        MOUTH 270 tablet 1   Semaglutide-Weight Management (WEGOVY) 2.4 MG/0.75ML SOAJ INJECT 2.4 MG UNDER THE SKIN ONCE WEEKLY 3 mL 5   traMADol (ULTRAM) 50 MG tablet Take 1 tablet (50 mg total) by mouth every 6 (six) hours as needed for severe pain. Each refill must last 30 days. 120 tablet 5   valACYclovir (VALTREX) 1000 MG tablet Take 1 tablet Daily for "cold sores                                                                     /                                                                   TAKE                                         BY  MOUTH 90 tablet 1   Vitamin D, Ergocalciferol, (DRISDOL) 1.25 MG (50000 UNIT) CAPS capsule TAKE 1 CAPSULE TWO TIMES A WEEK 26 capsule 3   No current facility-administered medications on file prior to visit.   Health Maintenance:   Immunization History  Administered Date(s) Administered   Influenza Inj Mdck Quad With Preservative 10/23/2016, 12/14/2019   Influenza Split 10/02/2011, 10/16/2013, 11/15/2014   Influenza,inj,Quad PF,6+ Mos 11/08/2020, 11/01/2021   PFIZER(Purple Top)SARS-COV-2 Vaccination 04/03/2019, 04/29/2019   Pneumococcal Polysaccharide-23 10/02/2011   Tdap 08/30/2016    Health Maintenance  Topic Date Due   Zoster Vaccines- Shingrix (1 of 2) Never done   COVID-19 Vaccine (3 - Pfizer risk series) 05/27/2019   INFLUENZA VACCINE  08/16/2022   MAMMOGRAM  06/25/2024   DTaP/Tdap/Td (2 - Td or Tdap) 08/31/2026   Cervical Cancer Screening (HPV/Pap Cotest)  11/02/2026   Colonoscopy  09/20/2027   Hepatitis C Screening  Completed   HIV Screening  Completed   HPV VACCINES  Aged Out     Patient Care Team: Lucky Cowboy, MD as PCP - General (Internal Medicine) Meryl Dare, MD as Consulting Physician (Gastroenterology) Lucky Cowboy, MD as Referring Physician (Internal Medicine)  Medical History:  Past Medical History:  Diagnosis Date   Allergy    Anemia    Anxiety    Arthritis    Fibromyalgia    GERD (gastroesophageal reflux disease)    Hypertension    IBS (irritable bowel syndrome)    Nephrolithiasis    Plantar fasciitis of right foot    wears boot    Allergies No Known Allergies  SURGICAL HISTORY She  has a past surgical history that includes Left shoulder arthroscopic surgery (Left); tummy tuck (2005); Breath tek h pylori (07/11/2011); Gastric Roux-En-Y (10/01/2011);  Cholecystectomy (10/01/2011); and Colonoscopy.   FAMILY HISTORY Her family history includes Breast cancer in her cousin and maternal grandmother; Breast cancer (age of onset: 25) in her mother; Breast cancer (age of onset: 86) in her maternal aunt; Heart attack in her maternal grandfather and paternal grandfather; Heart disease in her father and maternal grandfather; Hypertension in her father; Lung cancer in her maternal grandmother; Multiple sclerosis in her maternal uncle; Obesity in her brother; Ovarian cancer (age of onset: 85) in her mother; Prostate cancer in her paternal uncle; Stroke in her father.   SOCIAL HISTORY She  reports that she has been smoking cigarettes. She started smoking about 33 years ago. She has a 5.1 pack-year smoking history. She has never used smokeless tobacco. She reports current alcohol use of about 1.0 standard drink of alcohol per week. She reports that she does not use drugs.   Review of Systems: Review of Systems  Constitutional:  Negative for chills, diaphoresis, fever, malaise/fatigue and weight loss.  HENT:  Negative for hearing loss and tinnitus.   Eyes: Negative.  Negative for blurred vision and double vision.  Respiratory: Negative.  Negative for cough, sputum production, shortness of breath and wheezing.   Cardiovascular: Negative.  Negative for chest pain, palpitations, orthopnea, claudication, leg swelling and PND.  Gastrointestinal:  Positive for constipation. Negative for abdominal pain, blood in stool, diarrhea, heartburn, melena, nausea and vomiting.  Genitourinary: Negative.   Musculoskeletal:  Positive for back pain, joint pain, myalgias and neck pain. Negative for falls.  Skin: Negative.  Negative for rash.       Sun poisoning keratosis  Neurological: Negative.  Negative for dizziness, tingling, sensory change, weakness and headaches.  Endo/Heme/Allergies:  Negative for polydipsia.  Psychiatric/Behavioral:  Positive for memory loss. Negative  for depression, substance abuse and suicidal ideas. The patient is not nervous/anxious and does not have insomnia.        Difficulty focusing  All other systems reviewed and are negative.   Physical Exam: Estimated body mass index is 27.28 kg/m as calculated from the following:   Height as of this encounter: 5\' 7"  (1.702 m).   Weight as of this encounter: 174 lb 3.2 oz (79 kg). BP 102/62   Pulse 79   Temp (!) 97.5 F (36.4 C)   Ht 5\' 7"  (1.702 m)   Wt 174 lb 3.2 oz (79 kg)   LMP  (LMP Unknown)   SpO2 98%   BMI 27.28 kg/m  General Appearance: Well nourished, in no apparent distress.  Eyes: PERRLA, EOMs, conjunctiva no swelling or erythema Sinuses: No Frontal/maxillary tenderness  ENT/Mouth: Ext aud canals clear, normal light reflex with TMs without erythema, bulging. Good dentition. No erythema, swelling, or exudate on post pharynx.  Hearing normal.  Neck: Supple, thyroid normal. No bruits  Respiratory: Respiratory effort normal, BS equal bilaterally without rales, rhonchi, wheezing or stridor.  Cardio: RRR without murmurs, rubs or gallops. Brisk peripheral pulses without edema.  Chest: symmetric, with normal excursions and percussion.  Breasts: defer, no concerns, getting mammograms Abdomen: Soft, nontender, no guarding, rebound, hernias, masses, or organomegaly.  Lymphatics: Non tender without lymphadenopathy.  Genitourinary: defer Musculoskeletal: Full ROM all peripheral extremities,5/5 strength, and normal gait.  Skin: Warm, dry without rashes, lesions, ecchymosis. Neuro: Cranial nerves intact, reflexes equal bilaterally. Normal muscle tone, no cerebellar symptoms. Sensation intact.  Psych: Awake and oriented X 3, normal affect, Insight and Judgment appropriate.   EKG:  NSR , no ST changes AAA: < 3 cm  Dorsel Flinn Hollie Salk, NP 3:23 PM [facility]

## 2022-10-10 ENCOUNTER — Encounter: Payer: Self-pay | Admitting: Nurse Practitioner

## 2022-10-10 ENCOUNTER — Ambulatory Visit (INDEPENDENT_AMBULATORY_CARE_PROVIDER_SITE_OTHER): Payer: BC Managed Care – PPO | Admitting: Nurse Practitioner

## 2022-10-10 VITALS — BP 102/62 | HR 79 | Temp 97.5°F | Ht 67.0 in | Wt 174.2 lb

## 2022-10-10 DIAGNOSIS — F411 Generalized anxiety disorder: Secondary | ICD-10-CM

## 2022-10-10 DIAGNOSIS — Z720 Tobacco use: Secondary | ICD-10-CM

## 2022-10-10 DIAGNOSIS — Z79891 Long term (current) use of opiate analgesic: Secondary | ICD-10-CM

## 2022-10-10 DIAGNOSIS — G894 Chronic pain syndrome: Secondary | ICD-10-CM

## 2022-10-10 DIAGNOSIS — G8929 Other chronic pain: Secondary | ICD-10-CM

## 2022-10-10 DIAGNOSIS — E785 Hyperlipidemia, unspecified: Secondary | ICD-10-CM

## 2022-10-10 DIAGNOSIS — Z79899 Other long term (current) drug therapy: Secondary | ICD-10-CM

## 2022-10-10 DIAGNOSIS — R7309 Other abnormal glucose: Secondary | ICD-10-CM

## 2022-10-10 DIAGNOSIS — Z13220 Encounter for screening for lipoid disorders: Secondary | ICD-10-CM | POA: Diagnosis not present

## 2022-10-10 DIAGNOSIS — Z136 Encounter for screening for cardiovascular disorders: Secondary | ICD-10-CM

## 2022-10-10 DIAGNOSIS — E559 Vitamin D deficiency, unspecified: Secondary | ICD-10-CM | POA: Diagnosis not present

## 2022-10-10 DIAGNOSIS — K5903 Drug induced constipation: Secondary | ICD-10-CM

## 2022-10-10 DIAGNOSIS — Z131 Encounter for screening for diabetes mellitus: Secondary | ICD-10-CM

## 2022-10-10 DIAGNOSIS — Z0001 Encounter for general adult medical examination with abnormal findings: Secondary | ICD-10-CM

## 2022-10-10 DIAGNOSIS — E663 Overweight: Secondary | ICD-10-CM

## 2022-10-10 DIAGNOSIS — Z0000 Encounter for general adult medical examination without abnormal findings: Secondary | ICD-10-CM | POA: Diagnosis not present

## 2022-10-10 DIAGNOSIS — M797 Fibromyalgia: Secondary | ICD-10-CM

## 2022-10-10 DIAGNOSIS — L570 Actinic keratosis: Secondary | ICD-10-CM

## 2022-10-10 DIAGNOSIS — I10 Essential (primary) hypertension: Secondary | ICD-10-CM

## 2022-10-10 DIAGNOSIS — Z1389 Encounter for screening for other disorder: Secondary | ICD-10-CM | POA: Diagnosis not present

## 2022-10-10 DIAGNOSIS — Z1321 Encounter for screening for nutritional disorder: Secondary | ICD-10-CM

## 2022-10-10 DIAGNOSIS — R413 Other amnesia: Secondary | ICD-10-CM

## 2022-10-10 DIAGNOSIS — Z9884 Bariatric surgery status: Secondary | ICD-10-CM

## 2022-10-10 DIAGNOSIS — I700 Atherosclerosis of aorta: Secondary | ICD-10-CM

## 2022-10-10 DIAGNOSIS — Z1329 Encounter for screening for other suspected endocrine disorder: Secondary | ICD-10-CM

## 2022-10-10 DIAGNOSIS — Z130 Encounter for screening for diseases of the blood and blood-forming organs and certain disorders involving the immune mechanism: Secondary | ICD-10-CM

## 2022-10-10 MED ORDER — TRIAMCINOLONE ACETONIDE 0.1 % EX OINT
1.0000 | TOPICAL_OINTMENT | Freq: Two times a day (BID) | CUTANEOUS | 1 refills | Status: AC
Start: 2022-10-10 — End: ?

## 2022-10-10 MED ORDER — ZEPBOUND 10 MG/0.5ML ~~LOC~~ SOAJ: 10.0000 mg | SUBCUTANEOUS | 3 refills | Status: AC

## 2022-10-10 NOTE — Patient Instructions (Addendum)
[facility] Attention Specialist [facility] Location [phone]?[place], [place] [address] Phone: [phone] Fax: [phone] [email or web address]  Tirzepatide Injection (Weight Management) What is this medication? TIRZEPATIDE (tir ZEP a tide) promotes weight loss. It may also be used to maintain weight loss.  It works by decreasing appetite. Changes to diet and exercise are often combined with this medication. This medicine may be used for other purposes; ask your health care provider or pharmacist if you have questions. COMMON BRAND NAME(S): Zepbound What should I tell my care team before I take this medication? They need to know if you have any of these conditions: Eye disease caused by diabetes Gallbladder disease History of depression Pancreatic disease Kidney disease Stomach or intestine problems, such as problems digesting food Suicidal thoughts, plans, or attempt by you or a family member Personal or family history of MEN 2, a condition that causes endocrine gland tumors Personal or family history of thyroid cancer An unusual or allergic reaction to tirzepatide, other medications, foods, dyes, or preservatives Pregnant or trying to get pregnant Breastfeeding How should I use this medication? This medication is injected under the skin. You will be taught how to prepare and give it. Take it as directed on the prescription label. Keep taking it unless your care team tells you to stop. It is important that you put your used needles and syringes in a special sharps container. Do not put them in a trash can. If you do not have a sharps container, call your pharmacist or care team to get one. A special MedGuide will be given to you by the pharmacist with each prescription and refill. Be sure to read this information carefully each time. This medication comes with INSTRUCTIONS FOR USE. Ask your pharmacist for directions on how to use this medication. Read the information  carefully. Talk to your pharmacist or care team if you have questions. Talk to your care team about the use of this medication in children. Special care may be needed. Overdosage: If you think you have taken too much of this medicine contact a poison control center or emergency room at once. NOTE: This medicine is only for you. Do not share this medicine with others. What if I miss a dose? If you miss a dose, take it as soon as you can unless it is more than 4 days (96 hours) late. If it is more than 4 days late, skip the missed dose. Take the next dose at the normal time. Do not take 2 doses within 3 days (72 hours) of each other. What may interact with this medication? Certain medications for diabetes, such as insulin, glyburide, glipizide This medication may affect how other medications work. Talk with your care team about all of the medications you take. They may suggest changes to your treatment plan to lower the risk of side effects and to make sure your medications work as intended. This list may not describe all possible interactions. Give your health care provider a list of all the medicines, herbs, non-prescription drugs, or dietary supplements you use. Also tell them if you smoke, drink alcohol, or use illegal drugs. Some items may interact with your medicine. What should I watch for while using this medication? Visit your care team for regular checks on your progress. It may be some time before you see the benefit from this medication. Check with your care team if you have severe diarrhea, nausea, and vomiting, or if you sweat a lot. The loss of  too much body fluid may make it dangerous for you to take this medication. Tell your care team if you are taking medications to treat diabetes, such as insulin or sulfonylureas. This may increase your risk of low blood sugar. Know the symptoms of low blood sugar and how to treat it. Talk to your care team about your risk of cancer. You may be more  at risk for certain types of cancer if you take this medication. Estrogen and progestin hormones may not work as well while you are taking this medication. If you take these as pills by mouth, your care team may recommend another type of contraception for 4 weeks after you start this medication and for 4 weeks after each dose increase. Talk to your care team about contraceptive options. They can help you find the option that works for you. What side effects may I notice from receiving this medication? Side effects that you should report to your care team as soon as possible: Allergic reactions or angioedema--skin rash, itching or hives, swelling of the face, eyes, lips, tongue, arms, or legs, trouble swallowing or breathing Bowel blockage--stomach cramping, unable to have a bowel movement or pass gas, loss of appetite, vomiting Change in vision Dehydration--increased thirst, dry mouth, feeling faint or lightheaded, headache, dark yellow or brown urine Gallbladder problems--severe stomach pain, nausea, vomiting, fever Kidney injury--decrease in the amount of urine, swelling of the ankles, hands, or feet Pancreatitis--severe stomach pain that spreads to your back or gets worse after eating or when touched, fever, nausea, vomiting Thoughts of suicide or self-harm, worsening mood, feelings of depression Thyroid cancer--new mass or lump in the neck, pain or trouble swallowing, trouble breathing, hoarseness Side effects that usually do not require medical attention (report these to your care team if they continue or are bothersome): Diarrhea Loss of appetite Nausea Upset stomach This list may not describe all possible side effects. Call your doctor for medical advice about side effects. You may report side effects to FDA at 1-800-FDA-1088. Where should I keep my medication? Keep out of the reach of children and pets. Store in a refrigerator or at room temperature up to 30 degrees C (86 degrees F).  Keep it in the original container. Protect from light. Refrigeration (preferred): Store in the refrigerator. Do not freeze. Get rid of any unused medication after the expiration date. Room temperature: This medication may be stored at room temperature for up to 21 days. If it is stored at room temperature, get rid of any unused medication after 21 days or after it expires, whichever is first. To get rid of medications that are no longer needed or have expired: Take the medication to a medication take-back program. Check with your pharmacy or law enforcement to find a location. If you cannot return the medication, ask your pharmacist or care team how to get rid of this medication safely. NOTE: This sheet is a summary. It may not cover all possible information. If you have questions about this medicine, talk to your doctor, pharmacist, or health care provider.  2024 [employer] Standard (2022-01-30 00:00:00)

## 2022-10-11 LAB — URINALYSIS, ROUTINE W REFLEX MICROSCOPIC
Bacteria, UA: NONE SEEN /HPF
Bilirubin Urine: NEGATIVE
Glucose, UA: NEGATIVE
Hgb urine dipstick: NEGATIVE
Hyaline Cast: NONE SEEN /LPF
Ketones, ur: NEGATIVE
Nitrite: NEGATIVE
Protein, ur: NEGATIVE
RBC / HPF: NONE SEEN /HPF (ref 0–2)
Specific Gravity, Urine: 1.03 (ref 1.001–1.035)
Squamous Epithelial / HPF: NONE SEEN /HPF (ref <=5)
WBC, UA: NONE SEEN /HPF (ref 0–5)
pH: 7 (ref 5.0–8.0)

## 2022-10-11 LAB — COMPLETE METABOLIC PANEL WITH GFR
AG Ratio: 1.7 (calc) (ref 1.0–2.5)
ALT: 12 U/L (ref 6–29)
AST: 16 U/L (ref 10–35)
Albumin: 4.4 g/dL (ref 3.6–5.1)
Alkaline phosphatase (APISO): 180 U/L — ABNORMAL HIGH (ref 37–153)
BUN: 18 mg/dL (ref 7–25)
CO2: 31 mmol/L (ref 20–32)
Calcium: 10 mg/dL (ref 8.6–10.4)
Chloride: 103 mmol/L (ref 98–110)
Creat: 0.63 mg/dL (ref 0.50–1.03)
Globulin: 2.6 g/dL (calc) (ref 1.9–3.7)
Glucose, Bld: 96 mg/dL (ref 65–99)
Potassium: 4.3 mmol/L (ref 3.5–5.3)
Sodium: 143 mmol/L (ref 135–146)
Total Bilirubin: 0.3 mg/dL (ref 0.2–1.2)
Total Protein: 7 g/dL (ref 6.1–8.1)
eGFR: 104 mL/min/{1.73_m2} (ref >=60)

## 2022-10-11 LAB — VITAMIN D 25 HYDROXY (VIT D DEFICIENCY, FRACTURES): Vit D, 25-Hydroxy: 35 ng/mL (ref 30–100)

## 2022-10-11 LAB — MICROALBUMIN / CREATININE URINE RATIO
Creatinine, Urine: 168 mg/dL (ref 20–275)
Microalb Creat Ratio: 8 mg/g creat (ref <30)
Microalb, Ur: 1.3 mg/dL

## 2022-10-11 LAB — CBC WITH DIFFERENTIAL/PLATELET
Absolute Monocytes: 616 cells/uL (ref 200–950)
Basophils Absolute: 50 cells/uL (ref 0–200)
Basophils Relative: 0.9 %
Eosinophils Absolute: 190 cells/uL (ref 15–500)
Eosinophils Relative: 3.4 %
HCT: 39.2 % (ref 35.0–45.0)
Hemoglobin: 12.6 g/dL (ref 11.7–15.5)
Lymphs Abs: 1686 cells/uL (ref 850–3900)
MCH: 28.5 pg (ref 27.0–33.0)
MCHC: 32.1 g/dL (ref 32.0–36.0)
MCV: 88.7 fL (ref 80.0–100.0)
MPV: 9.7 fL (ref 7.5–12.5)
Monocytes Relative: 11 %
Neutro Abs: 3058 cells/uL (ref 1500–7800)
Neutrophils Relative %: 54.6 %
Platelets: 364 10*3/uL (ref 140–400)
RBC: 4.42 10*6/uL (ref 3.80–5.10)
RDW: 13.8 % (ref 11.0–15.0)
Total Lymphocyte: 30.1 %
WBC: 5.6 10*3/uL (ref 3.8–10.8)

## 2022-10-11 LAB — LIPID PANEL
Cholesterol: 150 mg/dL (ref <200)
HDL: 73 mg/dL (ref >=50)
LDL Cholesterol (Calc): 55 mg/dL (calc)
Non-HDL Cholesterol (Calc): 77 mg/dL (calc) (ref <130)
Total CHOL/HDL Ratio: 2.1 (calc) (ref <5.0)
Triglycerides: 135 mg/dL (ref <150)

## 2022-10-11 LAB — IRON,TIBC AND FERRITIN PANEL
%SAT: 11 % (calc) — ABNORMAL LOW (ref 16–45)
Ferritin: 5 ng/mL — ABNORMAL LOW (ref 16–232)
Iron: 49 ug/dL (ref 45–160)
TIBC: 428 mcg/dL (calc) (ref 250–450)

## 2022-10-11 LAB — HEMOGLOBIN A1C W/OUT EAG: Hgb A1c MFr Bld: 5.6 % of total Hgb (ref <5.7)

## 2022-10-11 LAB — VITAMIN B12: Vitamin B-12: >2000 pg/mL — ABNORMAL HIGH (ref 200–1100)

## 2022-10-11 LAB — TSH: TSH: 0.42 mIU/L (ref 0.40–4.50)

## 2022-10-11 LAB — MICROSCOPIC MESSAGE

## 2022-10-11 LAB — MAGNESIUM: Magnesium: 2.4 mg/dL (ref 1.5–2.5)

## 2022-10-12 ENCOUNTER — Other Ambulatory Visit: Payer: Self-pay | Admitting: Gastroenterology

## 2022-10-13 ENCOUNTER — Encounter: Payer: Self-pay | Admitting: Pain Medicine

## 2022-10-15 ENCOUNTER — Other Ambulatory Visit: Payer: Self-pay | Admitting: Nurse Practitioner

## 2022-10-15 ENCOUNTER — Telehealth: Payer: Self-pay

## 2022-10-15 ENCOUNTER — Encounter: Payer: Self-pay | Admitting: Nurse Practitioner

## 2022-10-15 DIAGNOSIS — M797 Fibromyalgia: Secondary | ICD-10-CM

## 2022-10-15 NOTE — Telephone Encounter (Signed)
Prior auth completed and submitted. 

## 2022-10-15 NOTE — Telephone Encounter (Signed)
Prior auth approved

## 2022-10-15 NOTE — Telephone Encounter (Signed)
Please call to see what prescription she is referring to

## 2022-10-18 NOTE — Telephone Encounter (Signed)
I will speak with him before I leave today and call you back.

## 2022-10-18 NOTE — Telephone Encounter (Signed)
Can you address this?

## 2022-10-22 ENCOUNTER — Telehealth: Payer: Self-pay | Admitting: Pain Medicine

## 2022-10-22 NOTE — Telephone Encounter (Signed)
PT current doctor office called stated that patient need refill on Gabapentin. Their office is no longer consist a pain management office. They can no longer fill patient prescription. If you have any question please give office a call. PT doesn't come back in for mm, appt until Feb of 2025. I will reach and bring patient in for a Eval to discuss medications.

## 2022-10-23 ENCOUNTER — Other Ambulatory Visit: Payer: Self-pay | Admitting: Nurse Practitioner

## 2022-10-23 DIAGNOSIS — M797 Fibromyalgia: Secondary | ICD-10-CM

## 2022-10-26 ENCOUNTER — Other Ambulatory Visit: Payer: Self-pay | Admitting: Nurse Practitioner

## 2022-10-26 DIAGNOSIS — M797 Fibromyalgia: Secondary | ICD-10-CM

## 2022-10-29 DIAGNOSIS — G894 Chronic pain syndrome: Secondary | ICD-10-CM | POA: Diagnosis not present

## 2022-10-29 DIAGNOSIS — I10 Essential (primary) hypertension: Secondary | ICD-10-CM | POA: Diagnosis not present

## 2022-10-29 DIAGNOSIS — Z23 Encounter for immunization: Secondary | ICD-10-CM | POA: Diagnosis not present

## 2022-10-29 DIAGNOSIS — M797 Fibromyalgia: Secondary | ICD-10-CM | POA: Diagnosis not present

## 2022-10-29 DIAGNOSIS — Z1331 Encounter for screening for depression: Secondary | ICD-10-CM | POA: Diagnosis not present

## 2022-11-25 ENCOUNTER — Other Ambulatory Visit: Payer: Self-pay | Admitting: Nurse Practitioner

## 2022-11-25 DIAGNOSIS — M797 Fibromyalgia: Secondary | ICD-10-CM

## 2022-11-27 DIAGNOSIS — Z903 Acquired absence of stomach [part of]: Secondary | ICD-10-CM | POA: Diagnosis not present

## 2022-11-27 DIAGNOSIS — Z9884 Bariatric surgery status: Secondary | ICD-10-CM | POA: Diagnosis not present

## 2022-11-27 DIAGNOSIS — D508 Other iron deficiency anemias: Secondary | ICD-10-CM | POA: Diagnosis not present

## 2022-11-27 DIAGNOSIS — E611 Iron deficiency: Secondary | ICD-10-CM | POA: Diagnosis not present

## 2022-11-27 DIAGNOSIS — K912 Postsurgical malabsorption, not elsewhere classified: Secondary | ICD-10-CM | POA: Diagnosis not present

## 2022-11-27 DIAGNOSIS — D500 Iron deficiency anemia secondary to blood loss (chronic): Secondary | ICD-10-CM | POA: Diagnosis not present

## 2022-12-18 DIAGNOSIS — D500 Iron deficiency anemia secondary to blood loss (chronic): Secondary | ICD-10-CM | POA: Diagnosis not present

## 2022-12-25 DIAGNOSIS — D500 Iron deficiency anemia secondary to blood loss (chronic): Secondary | ICD-10-CM | POA: Diagnosis not present

## 2023-01-29 DIAGNOSIS — M25551 Pain in right hip: Secondary | ICD-10-CM | POA: Diagnosis not present

## 2023-01-29 DIAGNOSIS — M797 Fibromyalgia: Secondary | ICD-10-CM | POA: Diagnosis not present

## 2023-01-29 DIAGNOSIS — M25511 Pain in right shoulder: Secondary | ICD-10-CM | POA: Diagnosis not present

## 2023-01-29 DIAGNOSIS — Z1331 Encounter for screening for depression: Secondary | ICD-10-CM | POA: Diagnosis not present

## 2023-01-29 DIAGNOSIS — Z23 Encounter for immunization: Secondary | ICD-10-CM | POA: Diagnosis not present

## 2023-02-19 DIAGNOSIS — R0981 Nasal congestion: Secondary | ICD-10-CM | POA: Diagnosis not present

## 2023-02-19 DIAGNOSIS — R059 Cough, unspecified: Secondary | ICD-10-CM | POA: Diagnosis not present

## 2023-02-19 DIAGNOSIS — B370 Candidal stomatitis: Secondary | ICD-10-CM | POA: Diagnosis not present

## 2023-02-19 DIAGNOSIS — R682 Dry mouth, unspecified: Secondary | ICD-10-CM | POA: Diagnosis not present

## 2023-02-19 NOTE — Patient Instructions (Signed)

## 2023-02-19 NOTE — Progress Notes (Signed)
 PROVIDER NOTE: Information contained herein reflects review and annotations entered in association with encounter. Interpretation of such information and data should be left to medically-trained personnel. Information provided to patient can be located elsewhere in the medical record under Patient Instructions. Document created using STT-dictation technology, any transcriptional errors that may result from process are unintentional.    Patient: Wendy Valencia  Service Category: E/M  Provider: Eric LABOR Como, MD  DOB: 09-17-1965  DOS: 02/20/2023  Referring Provider: Tonita Fallow, MD  MRN: [id number]  Specialty: Interventional Pain Management  PCP: Tonita Fallow, MD  Type: Established Patient  Setting: Ambulatory outpatient    Location: Office  Delivery: Face-to-face     HPI  Ms. Rowyn Mustapha Sortino, a 58 y.o. year old female, is here today because of her No primary diagnosis found.. Ms. Crock's primary complain today is Shoulder Pain  Pertinent problems: Ms. Fiorenza has Cervical spondylosis without myelopathy; Fibromyalgia affecting multiple sites (2ry area of Pain); Spondylosis of lumbar spine; Chronic generalized pain (1ry area of Pain); Chronic pain syndrome; Chronic shoulder pain (3ry area of Pain) (Bilateral) (R>L); Chronic hip pain (4th area of Pain) (Bilateral) (R>L); Greater trochanteric bursitis of hips (Bilateral); Osteoarthritis of hips (Bilateral); Lumbar facet syndrome (Right); Spondylosis without myelopathy or radiculopathy, lumbosacral region; Chronic hip pain (Left); and Chronic low back pain (Right) w/o sciatica on their pertinent problem list. Pain Assessment: Severity of Chronic pain is reported as a 4 /10. Location: Hip Right, Left/radiates down both legs on the side to knees, numbness in left thigh at the end of day. Onset: More than a month ago. Quality: Shooting, Aching, Dull, Sore. Timing: Constant. Modifying factor(s): meds. Vitals:  height is 5' 7 (1.702  m) and weight is 168 lb (76.2 kg). Her temperature is 98.1 F (36.7 C). Her blood pressure is 123/86 and her pulse is 84. Her respiration is 18 and oxygen saturation is 99%.  BMI: Estimated body mass index is 26.31 kg/m as calculated from the following:   Height as of this encounter: 5' 7 (1.702 m).   Weight as of this encounter: 168 lb (76.2 kg). Last encounter: 08/22/2022. Last procedure: Visit date not found.  Reason for encounter: medication management.  The patient indicates doing well with the current medication regimen. No adverse reactions or side effects reported to the medications.   Discussed the use of AI scribe software for clinical note transcription with the patient, who gave verbal consent to proceed.  History of Present Illness   Wendy Valencia is a 58 year old female who presents for medication management related to pain control.  She is on a long-term pain management regimen and is seeking to continue her current medication regimen. She experiences side effects or adverse reactions to her current pain medication, although specific details of these side effects were not specified.  A recent urine test was conducted as her previous one was a year old. The results of the Prescription Monitoring Program (PMP) were satisfactory, allowing for the continuation of her current medication regimen.  Her pharmacy details remain unchanged.     RTCB: 08/25/2023   Pharmacotherapy Assessment  Analgesic: Tramadol  50 mg 2 tablets twice daily (200 mg/day of tramadol ) MME/day: 20 mg/day.   Monitoring: [place] PMP: PDMP reviewed during this encounter.       Pharmacotherapy: No side-effects or adverse reactions reported. Compliance: No problems identified. Effectiveness: Clinically acceptable.  Rebecka Wolm HERO, RN  02/20/2023 10:55 AM  Sign when Signing Visit Nursing Pain Medication Assessment:  Safety precautions to be maintained throughout the outpatient stay will include: orient to  surroundings, keep bed in low position, maintain call bell within reach at all times, provide assistance with transfer out of bed and ambulation.  Medication Inspection Compliance: Pill count conducted under aseptic conditions, in front of the patient. Neither the pills nor the bottle was removed from the patient's sight at any time. Once count was completed pills were immediately returned to the patient in their original bottle.  Medication: Tramadol  (Ultram ) Pill/Patch Count:  111 of 120 pills remain Pill/Patch Appearance: Markings consistent with prescribed medication Bottle Appearance: Standard pharmacy container. Clearly labeled. Filled Date: 01 / 29 / 2025 Last Medication intake:  Today    CBD:THC Ratio  Date Value Ref Range Status  02/27/2021 Negative RATIO Final    Comment:    INTERPRETATION: Neither CBD nor marijuana or marijuana/THC products detected in this sample.  This test measures Cannabidiol (CBD) and Tetrahydrocannabinol (THC) and metabolites in urine. The CBD:THC ratio is calculated using the sums of the respective metabolites and is intended to assist in differentiating the presence of Tetrahydrocannabinol [facility]) metabolites due to the use of marijuana or medicinal THC from the presence of THC metabolites due to use of Cannabidiol (CBD) or hemp products that purportedly contain trace amounts of THC.  CBD:THC Ratio          Interpretation ---------      --------------------------------------------- >=10.0         Consistent with the use of CBD products only 1.0 - 9.9      Indeterminate <1.0           Consistent with marijuana, medicinal THC or                mixed use  Interpretive ranges are provided as guidance and should not be considered definitive. Interpretation of results should include consideration of all relevant clinical and diagnostic information.  Analysis performed by chromatography with mass spectrometry. This test was developed and its  performance characteristics determined by [facility].  It has not been cleared or approved by the Food and Drug Administration.    Carboxy-Delta-8-THC  Date Value Ref Range Status  02/27/2021 Not Detected ng/mL Final   Carboxy-Delta-9-THC  Date Value Ref Range Status  02/27/2021 Not Detected ng/mL Final    Comment:    Carboxy-Delta-9-THC is the primary metabolite of Delta-9- Tetrahydrocannabinol. Sources include the prescription medication Dronabinol as well as illicit, recreational, and medical marijuana and marijuana derived products of the same categories.  Carboxy-Delta-8-THC is the primary metabolite of Delta-8- Tetrahydrocannabinol. Sources are products containing Delta- 8-THC, which is primarily chemically manufactured from cannabidiol (CBD).  Testing Threshold = 2.0 ng/mL  Analysis performed by Liquid Chromatography with Tandem Mass Spectrometry (LC/MS/MS).  This test was developed and its performance characteristics determined by [facility].  It has not been cleared or approved by the Food and Drug Administration.     UDS:  Summary  Date Value Ref Range Status  02/20/2022 Note  Final    Comment:    ==================================================================== ToxASSURE Select 13 (MW) ==================================================================== Test                             Result       Flag       Units  Drug Present   Tramadol                        >  4310                   ng/mg creat   O-Desmethyltramadol            >4310                   ng/mg creat   N-Desmethyltramadol            >4310                   ng/mg creat    Source of tramadol  is a prescription medication. O-desmethyltramadol    and N-desmethyltramadol are expected metabolites of tramadol .  ==================================================================== Test                      Result    Flag   Units      Ref Range   Creatinine              116              mg/dL       >=79 ==================================================================== Declared Medications:  Medication list was not provided. ==================================================================== For clinical consultation, please call [phone]. ====================================================================       ROS  Constitutional: Denies any fever or chills Gastrointestinal: No reported hemesis, hematochezia, vomiting, or acute GI distress Musculoskeletal: Denies any acute onset joint swelling, redness, loss of ROM, or weakness Neurological: No reported episodes of acute onset apraxia, aphasia, dysarthria, agnosia, amnesia, paralysis, loss of coordination, or loss of consciousness  Medication Review  Aspirin-Acetaminophen -Caffeine, DULoxetine , Insulin  Pen Needle, Magnesium, QUEtiapine , Vitamin D  (Ergocalciferol ), betamethasone  dipropionate, cetirizine-pseudoephedrine, clotrimazole, diphenhydrAMINE , gabapentin , losartan , multivitamin with minerals, naloxone , omeprazole , tirzepatide , traMADol , triamcinolone  ointment, and valACYclovir   History Review  Allergy: Ms. Shane has no known allergies. Drug: Ms. Portlock  reports no history of drug use. Alcohol:  reports current alcohol use of about 1.0 standard drink of alcohol per week. Tobacco:  reports that she has been smoking cigarettes. She started smoking about 34 years ago. She has a 5.1 pack-year smoking history. She has never used smokeless tobacco. Social: Ms. Lensing  reports that she has been smoking cigarettes. She started smoking about 34 years ago. She has a 5.1 pack-year smoking history. She has never used smokeless tobacco. She reports current alcohol use of about 1.0 standard drink of alcohol per week. She reports that she does not use drugs. Medical:  has a past medical history of Allergy, Anemia, Anxiety, Arthritis, Fibromyalgia, GERD (gastroesophageal reflux disease), Hypertension, IBS (irritable  bowel syndrome), Nephrolithiasis, and Plantar fasciitis of right foot. Surgical: Ms. Weiher  has a past surgical history that includes Left shoulder arthroscopic surgery (Left); tummy tuck (2005); Breath tek h pylori (07/11/2011); Gastric Roux-En-Y (10/01/2011); Cholecystectomy (10/01/2011); and Colonoscopy. Family: family history includes Breast cancer in her cousin and maternal grandmother; Breast cancer (age of onset: 39) in her mother; Breast cancer (age of onset: 13) in her maternal aunt; Heart attack in her maternal grandfather and paternal grandfather; Heart disease in her father and maternal grandfather; Hypertension in her father; Lung cancer in her maternal grandmother; Multiple sclerosis in her maternal uncle; Obesity in her brother; Ovarian cancer (age of onset: 33) in her mother; Prostate cancer in her paternal uncle; Stroke in her father.  Laboratory Chemistry Profile   Renal Lab Results  Component Value Date   BUN 18 10/10/2022   CREATININE 0.63 10/10/2022   LABCREA 168 10/10/2022   BCR SEE NOTE: 10/10/2022   GFRAA 113  12/14/2019   GFRNONAA 97 12/14/2019    Hepatic Lab Results  Component Value Date   AST 16 10/10/2022   ALT 12 10/10/2022   ALBUMIN 4.5 03/05/2016   ALKPHOS 123 (H) 03/05/2016    Electrolytes Lab Results  Component Value Date   NA 143 10/10/2022   K 4.3 10/10/2022   CL 103 10/10/2022   CALCIUM 10.0 10/10/2022   MG 2.4 10/10/2022    Bone Lab Results  Component Value Date   VD25OH 35 10/10/2022    Inflammation (CRP: Acute Phase) (ESR: Chronic Phase) Lab Results  Component Value Date   CRP 0.9 03/04/2017   ESRSEDRATE 22 (H) 03/04/2017         Note: Above Lab results reviewed.  Recent Imaging Review  MM 3D SCREENING MAMMOGRAM BILATERAL BREAST CLINICAL DATA:  Screening.  EXAM: DIGITAL SCREENING BILATERAL MAMMOGRAM WITH TOMOSYNTHESIS AND CAD  TECHNIQUE: Bilateral screening digital craniocaudal and mediolateral oblique mammograms were  obtained. Bilateral screening digital breast tomosynthesis was performed. The images were evaluated with computer-aided detection.  COMPARISON:  Previous exam(s).  ACR Breast Density Category b: There are scattered areas of fibroglandular density.  FINDINGS: There are no findings suspicious for malignancy.  IMPRESSION: No mammographic evidence of malignancy. A result letter of this screening mammogram will be mailed directly to the patient.  RECOMMENDATION: Screening mammogram in one year. (Code:SM-B-01Y)  BI-RADS CATEGORY  1: Negative.  Electronically Signed   By: Lael Hines M.D.   On: 06/28/2022 11:57 Note: Reviewed        Physical Exam  General appearance: Well nourished, well developed, and well hydrated. In no apparent acute distress Mental status: Alert, oriented x 3 (person, place, & time)       Respiratory: No evidence of acute respiratory distress Eyes: PERLA Vitals: BP 123/86   Pulse 84   Temp 98.1 F (36.7 C)   Resp 18   Ht 5' 7 (1.702 m)   Wt 168 lb (76.2 kg)   LMP  (LMP Unknown)   SpO2 99%   BMI 26.31 kg/m  BMI: Estimated body mass index is 26.31 kg/m as calculated from the following:   Height as of this encounter: 5' 7 (1.702 m).   Weight as of this encounter: 168 lb (76.2 kg). Ideal: Ideal body weight: 61.6 kg (135 lb 12.9 oz) Adjusted ideal body weight: 67.4 kg (148 lb 10.9 oz)  Assessment   Diagnosis Status  1. Chronic generalized pain (1ry area of Pain)   2. Fibromyalgia affecting multiple sites (2ry area of Pain)   3. Chronic shoulder pain (3ry area of Pain) (Bilateral) (R>L)   4. Chronic hip pain (4th area of Pain) (Bilateral) (R>L)   5. Osteoarthritis of hips (Bilateral)   6. Chronic pain syndrome   7. Pharmacologic therapy   8. Chronic use of opiate for therapeutic purpose   9. Encounter for medication management   10. Encounter for chronic pain management    Controlled Controlled Controlled   Updated Problems: No problems  updated.  Plan of Care  Problem-specific:  Assessment and Plan    Chronic Pain Experiencing chronic pain with adverse reactions to current medication. A urine test was reviewed, and a new sample obtained today. The PMP check was satisfactory. Discussed the potential involvement of a nurse practitioner for medication management under supervision. Send refills to last six months. Schedule a follow-up appointment in six months unless earlier evaluation is needed. Inform about the potential involvement of a nurse practitioner for medication management.  Advise to request an appointment for new pain or flare-ups requiring evaluation or injections.       Ms. Jennfier Abdulla Eline has a current medication list which includes the following long-term medication(s): duloxetine , duloxetine , gabapentin , quetiapine , losartan , omeprazole , and [START ON 02/26/2023] tramadol .  Pharmacotherapy (Medications Ordered): Meds ordered this encounter  Medications   traMADol  (ULTRAM ) 50 MG tablet    Sig: Take 1 tablet (50 mg total) by mouth every 6 (six) hours as needed for severe pain (pain score 7-10). Each refill must last 30 days.    Dispense:  120 tablet    Refill:  5    DO NOT: delete (not duplicate); no partial-fill (will deny script to complete), no refill request (F/U required). DISPENSE: 1 day early if closed on fill date. WARN: No CNS-depressants within 8 hrs of med.   Orders:  Orders Placed This Encounter  Procedures   ToxASSURE Select 13 (MW), Urine    Volume: 30 ml(s). Minimum 3 ml of urine is needed. Document temperature of fresh sample. Indications: Long term (current) use of opiate analgesic (S20.108)    Release to patient:   Immediate   Follow-up plan:   Return in about 6 months (around 08/25/2023) for Eval-day (M,W), (F2F), (MM).      Interventional Therapies  Risk  Complexity Considerations:   WNL   Planned  Pending:      Under consideration:      Completed:   Diagnostic  bilateral IA hip joint inj. x1 (06/25/2017) (100/100/70/75-100)  Diagnostic bilateral superficial trochanteric bursa inj. x1 (06/25/2017) (100/100/70/75-100)    Therapeutic  Palliative (PRN) options:   Therapeutic bilateral hip & TB inj. #2      Recent Visits No visits were found meeting these conditions. Showing recent visits within past 90 days and meeting all other requirements Today's Visits Date Type Provider Dept  02/20/23 Office Visit Tanya Glisson, MD [facility]  Showing today's visits and meeting all other requirements Future Appointments No visits were found meeting these conditions. Showing future appointments within next 90 days and meeting all other requirements  I discussed the assessment and treatment plan with the patient. The patient was provided an opportunity to ask questions and all were answered. The patient agreed with the plan and demonstrated an understanding of the instructions.  Patient advised to call back or seek an in-person evaluation if the symptoms or condition worsens.  Duration of encounter: 30 minutes.  Total time on encounter, as per AMA guidelines included both the face-to-face and non-face-to-face time personally spent by the physician and/or other qualified health care professional(s) on the day of the encounter (includes time in activities that require the physician or other qualified health care professional and does not include time in activities normally performed by clinical staff). Physician's time may include the following activities when performed: Preparing to see the patient (e.g., pre-charting review of records, searching for previously ordered imaging, lab work, and nerve conduction tests) Review of prior analgesic pharmacotherapies. Reviewing PMP Interpreting ordered tests (e.g., lab work, imaging, nerve conduction tests) Performing post-procedure evaluations, including interpretation of diagnostic procedures Obtaining  and/or reviewing separately obtained history Performing a medically appropriate examination and/or evaluation Counseling and educating the patient/family/caregiver Ordering medications, tests, or procedures Referring and communicating with other health care professionals (when not separately reported) Documenting clinical information in the electronic or other health record Independently interpreting results (not separately reported) and communicating results to the patient/ family/caregiver Care coordination (not separately reported)  Note by: [place]  DELENA Como, MD Date: 02/20/2023; Time: 11:16 AM

## 2023-02-20 ENCOUNTER — Encounter: Payer: Self-pay | Admitting: Pain Medicine

## 2023-02-20 ENCOUNTER — Ambulatory Visit: Payer: BC Managed Care – PPO | Attending: Pain Medicine | Admitting: Pain Medicine

## 2023-02-20 DIAGNOSIS — M25512 Pain in left shoulder: Secondary | ICD-10-CM | POA: Insufficient documentation

## 2023-02-20 DIAGNOSIS — M25511 Pain in right shoulder: Secondary | ICD-10-CM | POA: Insufficient documentation

## 2023-02-20 DIAGNOSIS — G8929 Other chronic pain: Secondary | ICD-10-CM | POA: Insufficient documentation

## 2023-02-20 DIAGNOSIS — G894 Chronic pain syndrome: Secondary | ICD-10-CM | POA: Insufficient documentation

## 2023-02-20 DIAGNOSIS — M25551 Pain in right hip: Secondary | ICD-10-CM | POA: Insufficient documentation

## 2023-02-20 DIAGNOSIS — M160 Bilateral primary osteoarthritis of hip: Secondary | ICD-10-CM | POA: Insufficient documentation

## 2023-02-20 DIAGNOSIS — Z79899 Other long term (current) drug therapy: Secondary | ICD-10-CM | POA: Insufficient documentation

## 2023-02-20 DIAGNOSIS — R52 Pain, unspecified: Secondary | ICD-10-CM | POA: Insufficient documentation

## 2023-02-20 DIAGNOSIS — M25552 Pain in left hip: Secondary | ICD-10-CM | POA: Diagnosis not present

## 2023-02-20 DIAGNOSIS — Z79891 Long term (current) use of opiate analgesic: Secondary | ICD-10-CM | POA: Insufficient documentation

## 2023-02-20 DIAGNOSIS — M797 Fibromyalgia: Secondary | ICD-10-CM | POA: Diagnosis not present

## 2023-02-20 MED ORDER — TRAMADOL HCL 50 MG PO TABS: 50.0000 mg | ORAL_TABLET | Freq: Four times a day (QID) | ORAL | 5 refills | Status: DC | PRN

## 2023-02-20 NOTE — Progress Notes (Signed)
 Nursing Pain Medication Assessment:  Safety precautions to be maintained throughout the outpatient stay will include: orient to surroundings, keep bed in low position, maintain call bell within reach at all times, provide assistance with transfer out of bed and ambulation.  Medication Inspection Compliance: Pill count conducted under aseptic conditions, in front of the patient. Neither the pills nor the bottle was removed from the patient's sight at any time. Once count was completed pills were immediately returned to the patient in their original bottle.  Medication: Tramadol  (Ultram ) Pill/Patch Count:  111 of 120 pills remain Pill/Patch Appearance: Markings consistent with prescribed medication Bottle Appearance: Standard pharmacy container. Clearly labeled. Filled Date: 01 / 29 / 2025 Last Medication intake:  Today

## 2023-02-22 LAB — TOXASSURE SELECT 13 (MW), URINE

## 2023-02-26 DIAGNOSIS — Z79899 Other long term (current) drug therapy: Secondary | ICD-10-CM | POA: Diagnosis not present

## 2023-02-26 DIAGNOSIS — Z7985 Long-term (current) use of injectable non-insulin antidiabetic drugs: Secondary | ICD-10-CM | POA: Diagnosis not present

## 2023-02-26 DIAGNOSIS — K219 Gastro-esophageal reflux disease without esophagitis: Secondary | ICD-10-CM | POA: Diagnosis not present

## 2023-02-26 DIAGNOSIS — M797 Fibromyalgia: Secondary | ICD-10-CM | POA: Diagnosis not present

## 2023-02-26 DIAGNOSIS — Z903 Acquired absence of stomach [part of]: Secondary | ICD-10-CM | POA: Diagnosis not present

## 2023-02-26 DIAGNOSIS — K912 Postsurgical malabsorption, not elsewhere classified: Secondary | ICD-10-CM | POA: Diagnosis not present

## 2023-02-26 DIAGNOSIS — D508 Other iron deficiency anemias: Secondary | ICD-10-CM | POA: Diagnosis not present

## 2023-02-26 DIAGNOSIS — Z9884 Bariatric surgery status: Secondary | ICD-10-CM | POA: Diagnosis not present

## 2023-02-26 DIAGNOSIS — I10 Essential (primary) hypertension: Secondary | ICD-10-CM | POA: Diagnosis not present

## 2023-02-26 DIAGNOSIS — F17210 Nicotine dependence, cigarettes, uncomplicated: Secondary | ICD-10-CM | POA: Diagnosis not present

## 2023-03-12 ENCOUNTER — Other Ambulatory Visit: Payer: Self-pay

## 2023-03-12 MED ORDER — LOSARTAN POTASSIUM 100 MG PO TABS: 100.0000 mg | ORAL_TABLET | Freq: Every day | ORAL | 0 refills | Status: AC

## 2023-03-15 ENCOUNTER — Other Ambulatory Visit: Payer: Self-pay

## 2023-03-15 DIAGNOSIS — I10 Essential (primary) hypertension: Secondary | ICD-10-CM

## 2023-03-15 MED ORDER — LOSARTAN POTASSIUM-HCTZ 100-25 MG PO TABS: 1.0000 | ORAL_TABLET | Freq: Every day | ORAL | 3 refills | Status: AC

## 2023-03-28 ENCOUNTER — Ambulatory Visit: Payer: BC Managed Care – PPO | Admitting: Nurse Practitioner

## 2023-06-27 DIAGNOSIS — K912 Postsurgical malabsorption, not elsewhere classified: Secondary | ICD-10-CM | POA: Diagnosis not present

## 2023-06-27 DIAGNOSIS — Z903 Acquired absence of stomach [part of]: Secondary | ICD-10-CM | POA: Diagnosis not present

## 2023-06-27 DIAGNOSIS — Z9884 Bariatric surgery status: Secondary | ICD-10-CM | POA: Diagnosis not present

## 2023-06-27 DIAGNOSIS — D508 Other iron deficiency anemias: Secondary | ICD-10-CM | POA: Diagnosis not present

## 2023-08-02 DIAGNOSIS — F1120 Opioid dependence, uncomplicated: Secondary | ICD-10-CM | POA: Diagnosis not present

## 2023-08-02 DIAGNOSIS — G894 Chronic pain syndrome: Secondary | ICD-10-CM | POA: Diagnosis not present

## 2023-08-02 DIAGNOSIS — M797 Fibromyalgia: Secondary | ICD-10-CM | POA: Diagnosis not present

## 2023-08-02 DIAGNOSIS — I10 Essential (primary) hypertension: Secondary | ICD-10-CM | POA: Diagnosis not present

## 2023-08-09 DIAGNOSIS — R92323 Mammographic fibroglandular density, bilateral breasts: Secondary | ICD-10-CM | POA: Diagnosis not present

## 2023-08-09 DIAGNOSIS — Z1231 Encounter for screening mammogram for malignant neoplasm of breast: Secondary | ICD-10-CM | POA: Diagnosis not present

## 2023-08-19 ENCOUNTER — Ambulatory Visit: Payer: BC Managed Care – PPO | Attending: Pain Medicine | Admitting: Nurse Practitioner

## 2023-08-19 ENCOUNTER — Encounter: Payer: Self-pay | Admitting: Nurse Practitioner

## 2023-08-19 DIAGNOSIS — R52 Pain, unspecified: Secondary | ICD-10-CM | POA: Insufficient documentation

## 2023-08-19 DIAGNOSIS — M25551 Pain in right hip: Secondary | ICD-10-CM | POA: Diagnosis not present

## 2023-08-19 DIAGNOSIS — Z79899 Other long term (current) drug therapy: Secondary | ICD-10-CM | POA: Insufficient documentation

## 2023-08-19 DIAGNOSIS — M25552 Pain in left hip: Secondary | ICD-10-CM | POA: Insufficient documentation

## 2023-08-19 DIAGNOSIS — M160 Bilateral primary osteoarthritis of hip: Secondary | ICD-10-CM | POA: Diagnosis not present

## 2023-08-19 DIAGNOSIS — G894 Chronic pain syndrome: Secondary | ICD-10-CM | POA: Diagnosis not present

## 2023-08-19 DIAGNOSIS — G8929 Other chronic pain: Secondary | ICD-10-CM | POA: Diagnosis not present

## 2023-08-19 DIAGNOSIS — Z79891 Long term (current) use of opiate analgesic: Secondary | ICD-10-CM | POA: Diagnosis not present

## 2023-08-19 DIAGNOSIS — M25512 Pain in left shoulder: Secondary | ICD-10-CM | POA: Insufficient documentation

## 2023-08-19 DIAGNOSIS — M797 Fibromyalgia: Secondary | ICD-10-CM | POA: Diagnosis not present

## 2023-08-19 DIAGNOSIS — M25511 Pain in right shoulder: Secondary | ICD-10-CM | POA: Insufficient documentation

## 2023-08-19 MED ORDER — TRAMADOL HCL 50 MG PO TABS: 50.0000 mg | ORAL_TABLET | Freq: Four times a day (QID) | ORAL | 5 refills | Status: AC | PRN

## 2023-08-19 NOTE — Progress Notes (Signed)
 Nursing Pain Medication Assessment:  Safety precautions to be maintained throughout the outpatient stay will include: orient to surroundings, keep bed in low position, maintain call bell within reach at all times, provide assistance with transfer out of bed and ambulation.  Medication Inspection Compliance: Pill count conducted under aseptic conditions, in front of the patient. Neither the pills nor the bottle was removed from the patient's sight at any time. Once count was completed pills were immediately returned to the patient in their original bottle.  Medication: Tramadol  (Ultram ) Pill/Patch Count: 32 of 120 pills/patches remain Pill/Patch Appearance: Markings consistent with prescribed medication Bottle Appearance: Standard pharmacy container. Clearly labeled. Filled Date: 7 / 26 / 2025 Last Medication intake:  TodaySafety precautions to be maintained throughout the outpatient stay will include: orient to surroundings, keep bed in low position, maintain call bell within reach at all times, provide assistance with transfer out of bed and ambulation.

## 2023-08-19 NOTE — Progress Notes (Signed)
 PROVIDER NOTE: Interpretation of information contained herein should be left to medically-trained personnel. Specific patient instructions are provided elsewhere under Patient Instructions section of medical record. This document was created in part using AI and STT-dictation technology, any transcriptional errors that may result from this process are unintentional.  Patient: Wendy Valencia  Service: E/M   PCP: Tonita Fallow, MD  DOB: [birth year]  DOS: 08/19/2023  Provider: Emmy MARLA Blanch, NP  MRN: [id number]  Delivery: Face-to-face  Specialty: Interventional Pain Management  Type: Established Patient  Setting: Ambulatory outpatient facility  Specialty designation: 09  Referring Prov.: Tonita Fallow, MD  Location: Outpatient office facility       History of present illness (HPI) Wendy Valencia, a 58 y.o. year old female, is here today because of her No primary diagnosis found.  Wendy Valencia's primary complain today is Hip Pain (both)  Pertinent problems: Wendy Valencia has Cervical spondylosis without myelopathy; Fibromyalgia affecting multiple sites (2ry area of pain); Spondylosis of lumbar spine; Chronic generalized pain (1ry area of pain); Lumbar facet syndrome (Right); spondylosis without myelopathy or radiculopathy, Lumbosacral region; Chronic hip pain (Left); and Chronic pain syndrome on their pertinent problem list.  Pain Assessment: Severity of Chronic pain is reported as a 4 /10. Location: Hip Right, Left/pain radiaities down both leg to her calf. Onset: More than a month ago. Quality: Aching, Burning, Constant. Timing: Constant. Modifying factor(s): Meds, hot bath, electric blanket, pad, pain cream. Vitals:  height is 5' 8 (1.727 m) and weight is 168 lb (76.2 kg). Her temperature is 97.2 F (36.2 C) (abnormal). Her blood pressure is 122/78 and her pulse is 77. Her oxygen saturation is 100%.  BMI: Estimated body mass index is 25.54 kg/m as calculated from the following:    Height as of this encounter: 5' 8 (1.727 m).   Weight as of this encounter: 168 lb (76.2 kg).  Last encounter: 02/20/2023 Last procedure: Visit date not found.  Reason for encounter: medication management.  The patient indicates doing well with current medication regimen.  No adverse reaction or side effects reported to medication.  The patient reports chronic bilateral hip pain due to hip bursitis; however current pain medication regimen helps improve functional activities and provides pain relief.   Pharmacotherapy Assessment   Tramadol  (Ultram ) 50 mg by mouth every 6 hours as needed for pain. MME=40 Monitoring: [place] PMP: PDMP reviewed during this encounter.       Pharmacotherapy: No side-effects or adverse reactions reported. Compliance: No problems identified. Effectiveness: Clinically acceptable.  Delores Dorothe LABOR, RN  08/19/2023 10:35 AM  Sign when Signing Visit Nursing Pain Medication Assessment:  Safety precautions to be maintained throughout the outpatient stay will include: orient to surroundings, keep bed in low position, maintain call bell within reach at all times, provide assistance with transfer out of bed and ambulation.  Medication Inspection Compliance: Pill count conducted under aseptic conditions, in front of the patient. Neither the pills nor the bottle was removed from the patient's sight at any time. Once count was completed pills were immediately returned to the patient in their original bottle.  Medication: Tramadol  (Ultram ) Pill/Patch Count: 32 of 120 pills/patches remain Pill/Patch Appearance: Markings consistent with prescribed medication Bottle Appearance: Standard pharmacy container. Clearly labeled. Filled Date: 7 / 24 / 2025 Last Medication intake:  TodaySafety precautions to be maintained throughout the outpatient stay will include: orient to surroundings, keep bed in low position, maintain call bell within reach at all times, provide assistance with transfer  out of bed and ambulation.     UDS:  Summary  Date Value Ref Range Status  02/20/2023 FINAL  Final    Comment:    ==================================================================== [employer] Select 13 (MW) ==================================================================== Test                             Result       Flag       Units  Drug Present and Declared for Prescription Verification   Tramadol                        >3086        EXPECTED   ng/mg creat   O-Desmethyltramadol            >3086        EXPECTED   ng/mg creat   N-Desmethyltramadol            >3086        EXPECTED   ng/mg creat    Source of tramadol  is a prescription medication. O-desmethyltramadol    and N-desmethyltramadol are expected metabolites of tramadol .  ==================================================================== Test                      Result    Flag   Units      Ref Range   Creatinine              162              mg/dL      >=79 ==================================================================== Declared Medications:  The flagging and interpretation on this report are based on the  following declared medications.  Unexpected results may arise from  inaccuracies in the declared medications.   **Note: The testing scope of this panel includes these medications:   Tramadol  (Ultram )   **Note: The testing scope of this panel does not include the  following reported medications:   Acetaminophen   Aspirin  Caffeine  Cetirizine (Zyrtec)  Clotrimazole (Mycelex)  Diphenhydramine  (Benadryl )  Duloxetine  (Cymbalta )  Gabapentin  (Neurontin )  Insulin   Losartan  (Cozaar )  Magnesium  Multivitamin  Naloxone  (Narcan )  Omeprazole  (Prilosec)  Quetiapine  (Seroquel )  Tirzepatide  (Zepbound )  Topical  Triamcinolone  (Kenalog )  Valacyclovir  (Valtrex )  Vitamin D2 (Drisdol ) ==================================================================== For clinical consultation, please call [phone]. ====================================================================     CBD:THC Ratio  Date Value Ref Range Status  02/27/2021 Negative RATIO Final    Comment:    INTERPRETATION: Neither CBD nor marijuana or marijuana/THC products detected in this sample.  This test measures Cannabidiol (CBD) and Tetrahydrocannabinol (THC) and metabolites in urine. The CBD:THC ratio is calculated using the sums of the respective metabolites and is intended to assist in differentiating the presence of Tetrahydrocannabinol [facility]) metabolites due to the use of marijuana or medicinal THC from the presence of THC metabolites due to use of Cannabidiol (CBD) or hemp products that purportedly contain trace amounts of THC.  CBD:THC Ratio          Interpretation ---------      --------------------------------------------- >=10.0         Consistent with the use of CBD products only 1.0 - 9.9      Indeterminate <1.0           Consistent with marijuana, medicinal THC or                mixed use  Interpretive ranges are provided as guidance and should  not be considered definitive. Interpretation of results should include consideration of all relevant clinical and diagnostic information.  Analysis performed by chromatography with mass spectrometry. This test was developed and its performance characteristics determined by [facility].  It has not been cleared or approved by the Food and Drug Administration.    Carboxy-Delta-8-THC  Date Value Ref Range Status  02/27/2021 Not Detected ng/mL Final   Carboxy-Delta-9-THC  Date Value Ref Range Status  02/27/2021 Not Detected ng/mL Final    Comment:    Carboxy-Delta-9-THC is the primary metabolite of Delta-9- Tetrahydrocannabinol. Sources include the prescription medication Dronabinol as well as illicit, recreational, and medical marijuana and marijuana derived products of the same categories.  Carboxy-Delta-8-THC is the primary metabolite of  Delta-8- Tetrahydrocannabinol. Sources are products containing Delta- 8-THC, which is primarily chemically manufactured from cannabidiol (CBD).  Testing Threshold = 2.0 ng/mL  Analysis performed by Liquid Chromatography with Tandem Mass Spectrometry (LC/MS/MS).  This test was developed and its performance characteristics determined by [facility].  It has not been cleared or approved by the Food and Drug Administration.     ROS  Constitutional: Denies any fever or chills Gastrointestinal: No reported hemesis, hematochezia, vomiting, or acute GI distress Musculoskeletal: Bilateral hip pain Neurological: No reported episodes of acute onset apraxia, aphasia, dysarthria, agnosia, amnesia, paralysis, loss of coordination, or loss of consciousness  Medication Review  Aspirin-Acetaminophen -Caffeine, DULoxetine , Insulin  Pen Needle, Magnesium, QUEtiapine , Vitamin D  (Ergocalciferol ), betamethasone  dipropionate, cetirizine-pseudoephedrine, diphenhydrAMINE , gabapentin , losartan , losartan -hydrochlorothiazide , multivitamin with minerals, naloxone , omeprazole , tirzepatide , traMADol , triamcinolone  ointment, and valACYclovir   History Review  Allergy: Wendy Valencia has no known allergies. Drug: Wendy Valencia  reports no history of drug use. Alcohol:  reports current alcohol use of about 1.0 standard drink of alcohol per week. Tobacco:  reports that she has been smoking cigarettes. She started smoking about 34 years ago. She has a 5.2 pack-year smoking history. She has never used smokeless tobacco. Social: Wendy Valencia  reports that she has been smoking cigarettes. She started smoking about 34 years ago. She has a 5.2 pack-year smoking history. She has never used smokeless tobacco. She reports current alcohol use of about 1.0 standard drink of alcohol per week. She reports that she does not use drugs. Medical:  has a past medical history of Allergy, Anemia, Anxiety, Arthritis, Fibromyalgia, GERD  (gastroesophageal reflux disease), Hypertension, IBS (irritable bowel syndrome), Nephrolithiasis, and Plantar fasciitis of right foot. Surgical: Wendy Valencia  has a past surgical history that includes Left shoulder arthroscopic surgery (Left); tummy tuck (2005); Breath tek h pylori (07/11/2011); Gastric Roux-En-Y (10/01/2011); Cholecystectomy (10/01/2011); and Colonoscopy. Family: family history includes Breast cancer in her cousin and maternal grandmother; Breast cancer (age of onset: 80) in her mother; Breast cancer (age of onset: 51) in her maternal aunt; Heart attack in her maternal grandfather and paternal grandfather; Heart disease in her father and maternal grandfather; Hypertension in her father; Lung cancer in her maternal grandmother; Multiple sclerosis in her maternal uncle; Obesity in her brother; Ovarian cancer (age of onset: 3) in her mother; Prostate cancer in her paternal uncle; Stroke in her father.  Laboratory Chemistry Profile   Renal Lab Results  Component Value Date   BUN 18 10/10/2022   CREATININE 0.63 10/10/2022   LABCREA 168 10/10/2022   BCR SEE NOTE: 10/10/2022   GFRAA 113 12/14/2019   GFRNONAA 97 12/14/2019    Hepatic Lab Results  Component Value Date   AST 16 10/10/2022   ALT 12 10/10/2022   ALBUMIN 4.5 03/05/2016  ALKPHOS 123 (H) 03/05/2016    Electrolytes Lab Results  Component Value Date   NA 143 10/10/2022   K 4.3 10/10/2022   CL 103 10/10/2022   CALCIUM 10.0 10/10/2022   MG 2.4 10/10/2022    Bone Lab Results  Component Value Date   VD25OH 35 10/10/2022    Inflammation (CRP: Acute Phase) (ESR: Chronic Phase) Lab Results  Component Value Date   CRP 0.9 03/04/2017   ESRSEDRATE 22 (H) 03/04/2017         Note: Above Lab results reviewed.  Recent Imaging Review  MM 3D SCREENING MAMMOGRAM BILATERAL BREAST CLINICAL DATA:  Screening.  EXAM: DIGITAL SCREENING BILATERAL MAMMOGRAM WITH TOMOSYNTHESIS AND CAD  TECHNIQUE: Bilateral screening  digital craniocaudal and mediolateral oblique mammograms were obtained. Bilateral screening digital breast tomosynthesis was performed. The images were evaluated with computer-aided detection.  COMPARISON:  Previous exam(s).  ACR Breast Density Category b: There are scattered areas of fibroglandular density.  FINDINGS: There are no findings suspicious for malignancy.  IMPRESSION: No mammographic evidence of malignancy. A result letter of this screening mammogram will be mailed directly to the patient.  RECOMMENDATION: Screening mammogram in one year. (Code:SM-B-01Y)  BI-RADS CATEGORY  1: Negative.  Electronically Signed   By: Lael Hines M.D.   On: 06/28/2022 11:57 Note: Reviewed        Physical Exam  Vitals: BP 122/78   Pulse 77   Temp (!) 97.2 F (36.2 C)   Ht 5' 8 (1.727 m)   Wt 168 lb (76.2 kg)   LMP  (LMP Unknown)   SpO2 100%   BMI 25.54 kg/m  BMI: Estimated body mass index is 25.54 kg/m as calculated from the following:   Height as of this encounter: 5' 8 (1.727 m).   Weight as of this encounter: 168 lb (76.2 kg). Ideal: Ideal body weight: 63.9 kg (140 lb 14 oz) Adjusted ideal body weight: 68.8 kg (151 lb 11.6 oz) General appearance: Well nourished, well developed, and well hydrated. In no apparent acute distress Mental status: Alert, oriented x 3 (person, place, & time)       Respiratory: No evidence of acute respiratory distress Eyes: PERLA   Assessment   Diagnosis Status  1. Chronic generalized pain (1ry area of Pain)   2. Fibromyalgia affecting multiple sites (2ry area of Pain)   3. Chronic shoulder pain (3ry area of Pain) (Bilateral) (R>L)   4. Chronic hip pain (4th area of Pain) (Bilateral) (R>L)   5. Osteoarthritis of hips (Bilateral)   6. Chronic pain syndrome   7. Pharmacologic therapy   8. Chronic use of opiate for therapeutic purpose   9. Encounter for medication management   10. Encounter for chronic pain management     Controlled Controlled Controlled   Updated Problems: No problems updated.  Plan of Care  Problem-specific:  Assessment and Plan We will continue on current medication regimen.  Prescribing drug monitoring (PDMP) reviewed; findings consistent with the use of prescribed medication and no evidence of narcotic misuse or abuse.  Urine drug screening (UDS up-to-date.  No other new issues or problems reported to this visit.  Schedule follow-up in 41-month for medication management with Mayana Irigoyen, NP.   Wendy Valencia has a current medication list which includes the following long-term medication(s): duloxetine , duloxetine , gabapentin , losartan , losartan -hydrochlorothiazide , omeprazole , quetiapine , and tramadol .  Pharmacotherapy (Medications Ordered): Meds ordered this encounter  Medications   traMADol  (ULTRAM ) 50 MG tablet    Sig: Take 1 tablet (  50 mg total) by mouth every 6 (six) hours as needed for severe pain (pain score 7-10). Each refill must last 30 days.    Dispense:  120 tablet    Refill:  5    DO NOT: delete (not duplicate); no partial-fill (will deny script to complete), no refill request (F/U required). DISPENSE: 1 day early if closed on fill date. WARN: No CNS-depressants within 8 hrs of med.   Orders:  No orders of the defined types were placed in this encounter.       Return in about 6 months (around 02/19/2024) for (F2F), (MM), Emmy Blanch NP.    Recent Visits No visits were found meeting these conditions. Showing recent visits within past 90 days and meeting all other requirements Today's Visits Date Type Provider Dept  08/19/23 Office Visit Duwane Gewirtz K, NP [facility]  Showing today's visits and meeting all other requirements Future Appointments No visits were found meeting these conditions. Showing future appointments within next 90 days and meeting all other requirements  I discussed the assessment and treatment plan with the patient. The  patient was provided an opportunity to ask questions and all were answered. The patient agreed with the plan and demonstrated an understanding of the instructions.  Patient advised to call back or seek an in-person evaluation if the symptoms or condition worsens.  Duration of encounter: 30 minutes.  Total time on encounter, as per AMA guidelines included both the face-to-face and non-face-to-face time personally spent by the physician and/or other qualified health care professional(s) on the day of the encounter (includes time in activities that require the physician or other qualified health care professional and does not include time in activities normally performed by clinical staff). Physician's time may include the following activities when performed: Preparing to see the patient (e.g., pre-charting review of records, searching for previously ordered imaging, lab work, and nerve conduction tests) Review of prior analgesic pharmacotherapies. Reviewing PMP Interpreting ordered tests (e.g., lab work, imaging, nerve conduction tests) Performing post-procedure evaluations, including interpretation of diagnostic procedures Obtaining and/or reviewing separately obtained history Performing a medically appropriate examination and/or evaluation Counseling and educating the patient/family/caregiver Ordering medications, tests, or procedures Referring and communicating with other health care professionals (when not separately reported) Documenting clinical information in the electronic or other health record Independently interpreting results (not separately reported) and communicating results to the patient/ family/caregiver Care coordination (not separately reported)  Note by: Maximiano Lott K Aylin Rhoads, NP (TTS and AI technology used. I apologize for any typographical errors that were not detected and corrected.) Date: 08/19/2023; Time: 11:35 AM

## 2023-09-25 ENCOUNTER — Encounter: Payer: BC Managed Care – PPO | Admitting: Nurse Practitioner

## 2023-10-11 ENCOUNTER — Encounter: Payer: BC Managed Care – PPO | Admitting: Nurse Practitioner

## 2023-10-11 DIAGNOSIS — M797 Fibromyalgia: Secondary | ICD-10-CM | POA: Diagnosis not present

## 2023-10-11 DIAGNOSIS — Z23 Encounter for immunization: Secondary | ICD-10-CM | POA: Diagnosis not present

## 2023-10-11 DIAGNOSIS — D500 Iron deficiency anemia secondary to blood loss (chronic): Secondary | ICD-10-CM | POA: Diagnosis not present

## 2023-10-11 DIAGNOSIS — Z0000 Encounter for general adult medical examination without abnormal findings: Secondary | ICD-10-CM | POA: Diagnosis not present

## 2023-10-11 DIAGNOSIS — Z720 Tobacco use: Secondary | ICD-10-CM | POA: Diagnosis not present

## 2023-10-11 DIAGNOSIS — I10 Essential (primary) hypertension: Secondary | ICD-10-CM | POA: Diagnosis not present

## 2024-01-06 ENCOUNTER — Other Ambulatory Visit: Payer: Self-pay | Admitting: Nurse Practitioner

## 2024-01-06 DIAGNOSIS — L309 Dermatitis, unspecified: Secondary | ICD-10-CM

## 2024-01-24 ENCOUNTER — Other Ambulatory Visit: Payer: Self-pay | Admitting: Nurse Practitioner

## 2024-01-24 DIAGNOSIS — L309 Dermatitis, unspecified: Secondary | ICD-10-CM

## 2024-02-17 ENCOUNTER — Encounter: Admitting: Nurse Practitioner

## 2024-02-27 ENCOUNTER — Encounter: Admitting: Nurse Practitioner
